# Patient Record
Sex: Male | Born: 1935 | Race: White | Hispanic: No | Marital: Married | Smoking: Former smoker
Health system: Southern US, Community
[De-identification: ages and names within clinical notes are randomized; demographics above are authoritative.]

## PROBLEM LIST (undated history)

## (undated) DIAGNOSIS — I499 Cardiac arrhythmia, unspecified: Secondary | ICD-10-CM

## (undated) DIAGNOSIS — F329 Major depressive disorder, single episode, unspecified: Secondary | ICD-10-CM

## (undated) DIAGNOSIS — K5790 Diverticulosis of intestine, part unspecified, without perforation or abscess without bleeding: Secondary | ICD-10-CM

## (undated) DIAGNOSIS — I10 Essential (primary) hypertension: Secondary | ICD-10-CM

## (undated) DIAGNOSIS — E785 Hyperlipidemia, unspecified: Secondary | ICD-10-CM

## (undated) DIAGNOSIS — R251 Tremor, unspecified: Secondary | ICD-10-CM

## (undated) DIAGNOSIS — I6529 Occlusion and stenosis of unspecified carotid artery: Secondary | ICD-10-CM

## (undated) DIAGNOSIS — F419 Anxiety disorder, unspecified: Secondary | ICD-10-CM

## (undated) DIAGNOSIS — N400 Enlarged prostate without lower urinary tract symptoms: Secondary | ICD-10-CM

## (undated) DIAGNOSIS — R269 Unspecified abnormalities of gait and mobility: Secondary | ICD-10-CM

## (undated) DIAGNOSIS — R413 Other amnesia: Secondary | ICD-10-CM

## (undated) DIAGNOSIS — F32A Depression, unspecified: Secondary | ICD-10-CM

## (undated) DIAGNOSIS — D126 Benign neoplasm of colon, unspecified: Secondary | ICD-10-CM

## (undated) DIAGNOSIS — E1142 Type 2 diabetes mellitus with diabetic polyneuropathy: Secondary | ICD-10-CM

## (undated) DIAGNOSIS — M1990 Unspecified osteoarthritis, unspecified site: Secondary | ICD-10-CM

## (undated) DIAGNOSIS — I350 Nonrheumatic aortic (valve) stenosis: Secondary | ICD-10-CM

## (undated) DIAGNOSIS — F0390 Unspecified dementia without behavioral disturbance: Secondary | ICD-10-CM

## (undated) DIAGNOSIS — E119 Type 2 diabetes mellitus without complications: Secondary | ICD-10-CM

## (undated) DIAGNOSIS — K219 Gastro-esophageal reflux disease without esophagitis: Secondary | ICD-10-CM

## (undated) DIAGNOSIS — E538 Deficiency of other specified B group vitamins: Secondary | ICD-10-CM

## (undated) HISTORY — DX: Unspecified abnormalities of gait and mobility: R26.9

## (undated) HISTORY — DX: Nonrheumatic aortic (valve) stenosis: I35.0

## (undated) HISTORY — DX: Essential (primary) hypertension: I10

## (undated) HISTORY — PX: LUMBAR LAMINECTOMY: SHX95

## (undated) HISTORY — DX: Unspecified osteoarthritis, unspecified site: M19.90

## (undated) HISTORY — DX: Type 2 diabetes mellitus with diabetic polyneuropathy: E11.42

## (undated) HISTORY — DX: Benign neoplasm of colon, unspecified: D12.6

## (undated) HISTORY — DX: Tremor, unspecified: R25.1

## (undated) HISTORY — DX: Occlusion and stenosis of unspecified carotid artery: I65.29

## (undated) HISTORY — PX: OTHER SURGICAL HISTORY: SHX169

## (undated) HISTORY — PX: VASECTOMY: SHX75

## (undated) HISTORY — PX: TONSILLECTOMY AND ADENOIDECTOMY: SHX28

## (undated) HISTORY — PX: CATARACT EXTRACTION, BILATERAL: SHX1313

## (undated) HISTORY — DX: Major depressive disorder, single episode, unspecified: F32.9

## (undated) HISTORY — DX: Deficiency of other specified B group vitamins: E53.8

## (undated) HISTORY — DX: Anxiety disorder, unspecified: F41.9

## (undated) HISTORY — DX: Depression, unspecified: F32.A

## (undated) HISTORY — DX: Other amnesia: R41.3

## (undated) HISTORY — DX: Hyperlipidemia, unspecified: E78.5

## (undated) HISTORY — DX: Gastro-esophageal reflux disease without esophagitis: K21.9

## (undated) HISTORY — DX: Diverticulosis of intestine, part unspecified, without perforation or abscess without bleeding: K57.90

## (undated) HISTORY — DX: Type 2 diabetes mellitus without complications: E11.9

---

## 1998-06-09 ENCOUNTER — Ambulatory Visit: Admission: RE | Admit: 1998-06-09 | Discharge: 1998-06-09 | Payer: Self-pay | Admitting: Internal Medicine

## 1999-02-15 ENCOUNTER — Ambulatory Visit: Admission: RE | Admit: 1999-02-15 | Discharge: 1999-02-15 | Payer: Self-pay | Admitting: Pulmonary Disease

## 2001-01-25 ENCOUNTER — Encounter: Payer: Self-pay | Admitting: Urology

## 2001-01-26 ENCOUNTER — Ambulatory Visit (HOSPITAL_COMMUNITY): Admission: RE | Admit: 2001-01-26 | Discharge: 2001-01-26 | Payer: Self-pay | Admitting: Urology

## 2002-09-14 ENCOUNTER — Encounter: Admission: RE | Admit: 2002-09-14 | Discharge: 2002-09-14 | Payer: Self-pay | Admitting: Internal Medicine

## 2002-09-14 ENCOUNTER — Encounter: Payer: Self-pay | Admitting: Internal Medicine

## 2003-01-05 ENCOUNTER — Encounter: Payer: Self-pay | Admitting: Gastroenterology

## 2003-01-05 ENCOUNTER — Ambulatory Visit (HOSPITAL_COMMUNITY): Admission: RE | Admit: 2003-01-05 | Discharge: 2003-01-05 | Payer: Self-pay | Admitting: Gastroenterology

## 2003-09-04 ENCOUNTER — Ambulatory Visit (HOSPITAL_COMMUNITY): Admission: RE | Admit: 2003-09-04 | Discharge: 2003-09-04 | Payer: Self-pay | Admitting: Internal Medicine

## 2003-10-04 ENCOUNTER — Encounter: Admission: RE | Admit: 2003-10-04 | Discharge: 2004-01-02 | Payer: Self-pay | Admitting: Neurology

## 2004-05-14 ENCOUNTER — Ambulatory Visit: Payer: Self-pay | Admitting: Internal Medicine

## 2004-06-02 ENCOUNTER — Ambulatory Visit: Payer: Self-pay | Admitting: Pulmonary Disease

## 2004-07-22 ENCOUNTER — Ambulatory Visit: Payer: Self-pay | Admitting: Internal Medicine

## 2004-07-23 ENCOUNTER — Ambulatory Visit: Payer: Self-pay | Admitting: Internal Medicine

## 2004-09-03 ENCOUNTER — Ambulatory Visit: Payer: Self-pay | Admitting: Internal Medicine

## 2004-09-22 ENCOUNTER — Ambulatory Visit: Payer: Self-pay | Admitting: Internal Medicine

## 2004-10-15 ENCOUNTER — Ambulatory Visit: Payer: Self-pay | Admitting: Internal Medicine

## 2004-11-25 ENCOUNTER — Ambulatory Visit: Payer: Self-pay | Admitting: Internal Medicine

## 2004-11-26 ENCOUNTER — Ambulatory Visit: Payer: Self-pay | Admitting: Internal Medicine

## 2005-02-03 ENCOUNTER — Ambulatory Visit: Payer: Self-pay | Admitting: Cardiology

## 2005-02-03 ENCOUNTER — Ambulatory Visit: Payer: Self-pay | Admitting: Internal Medicine

## 2005-02-10 ENCOUNTER — Ambulatory Visit: Payer: Self-pay | Admitting: Internal Medicine

## 2005-02-17 ENCOUNTER — Ambulatory Visit: Payer: Self-pay | Admitting: Internal Medicine

## 2005-04-21 ENCOUNTER — Ambulatory Visit: Payer: Self-pay | Admitting: Internal Medicine

## 2005-05-26 ENCOUNTER — Ambulatory Visit: Payer: Self-pay | Admitting: Internal Medicine

## 2005-06-25 ENCOUNTER — Ambulatory Visit: Payer: Self-pay | Admitting: Internal Medicine

## 2005-07-01 ENCOUNTER — Ambulatory Visit: Payer: Self-pay | Admitting: Internal Medicine

## 2005-07-27 ENCOUNTER — Ambulatory Visit: Payer: Self-pay

## 2005-08-05 ENCOUNTER — Ambulatory Visit: Payer: Self-pay | Admitting: Internal Medicine

## 2005-08-27 HISTORY — PX: TRANSURETHRAL RESECTION OF PROSTATE: SHX73

## 2005-09-01 ENCOUNTER — Ambulatory Visit: Payer: Self-pay | Admitting: Internal Medicine

## 2005-10-23 ENCOUNTER — Ambulatory Visit: Payer: Self-pay | Admitting: Internal Medicine

## 2005-11-03 ENCOUNTER — Ambulatory Visit: Payer: Self-pay | Admitting: Internal Medicine

## 2005-12-07 ENCOUNTER — Ambulatory Visit: Payer: Self-pay | Admitting: Internal Medicine

## 2006-01-06 ENCOUNTER — Ambulatory Visit: Payer: Self-pay | Admitting: Internal Medicine

## 2006-01-28 ENCOUNTER — Ambulatory Visit: Payer: Self-pay | Admitting: Internal Medicine

## 2006-02-03 ENCOUNTER — Ambulatory Visit: Payer: Self-pay | Admitting: Internal Medicine

## 2006-02-09 ENCOUNTER — Encounter: Payer: Self-pay | Admitting: Internal Medicine

## 2006-03-12 ENCOUNTER — Ambulatory Visit: Payer: Self-pay | Admitting: Internal Medicine

## 2006-04-12 ENCOUNTER — Ambulatory Visit: Payer: Self-pay | Admitting: Internal Medicine

## 2006-04-28 ENCOUNTER — Ambulatory Visit: Payer: Self-pay | Admitting: Internal Medicine

## 2006-04-28 LAB — CONVERTED CEMR LAB
Bilirubin Urine: NEGATIVE
Ketones, ur: NEGATIVE mg/dL
Leukocytes, UA: NEGATIVE
Nitrite: NEGATIVE
Potassium: 3.7 meq/L (ref 3.5–5.1)
Sed Rate: 5 mm/hr (ref 0–20)
Sodium: 141 meq/L (ref 135–145)
Specific Gravity, Urine: 1.02 (ref 1.000–1.03)
TSH: 1.34 microintl units/mL (ref 0.35–5.50)
Total CK: 62 units/L (ref 7–195)
Urine Glucose: NEGATIVE mg/dL
Urobilinogen, UA: 0.2 (ref 0.0–1.0)
pH: 6 (ref 5.0–8.0)

## 2006-05-25 ENCOUNTER — Encounter: Admission: RE | Admit: 2006-05-25 | Discharge: 2006-05-25 | Payer: Self-pay | Admitting: Orthopaedic Surgery

## 2006-06-02 ENCOUNTER — Ambulatory Visit: Payer: Self-pay | Admitting: Internal Medicine

## 2006-06-14 ENCOUNTER — Encounter: Admission: RE | Admit: 2006-06-14 | Discharge: 2006-06-14 | Payer: Self-pay | Admitting: Orthopaedic Surgery

## 2006-07-01 ENCOUNTER — Ambulatory Visit: Payer: Self-pay | Admitting: Internal Medicine

## 2006-07-01 LAB — CONVERTED CEMR LAB
ALT: 42 units/L — ABNORMAL HIGH (ref 0–40)
Creatinine, Ser: 1 mg/dL (ref 0.4–1.5)
Potassium: 3.7 meq/L (ref 3.5–5.1)
Testosterone, total: 2.5267 ng/mL — ABNORMAL LOW

## 2006-07-21 ENCOUNTER — Ambulatory Visit: Payer: Self-pay | Admitting: Internal Medicine

## 2006-07-27 ENCOUNTER — Ambulatory Visit: Payer: Self-pay

## 2006-07-27 LAB — CONVERTED CEMR LAB
AST: 23 units/L (ref 0–37)
BUN: 11 mg/dL (ref 6–23)
Bacteria, UA: NEGATIVE
Basophils Relative: 0.4 % (ref 0.0–1.0)
Bilirubin Urine: NEGATIVE
Crystals: NEGATIVE
Glucose, Bld: 116 mg/dL — ABNORMAL HIGH (ref 70–99)
HCT: 41.8 % (ref 39.0–52.0)
HDL: 36.2 mg/dL — ABNORMAL LOW (ref >39.0)
Hemoglobin, Urine: NEGATIVE
Hemoglobin: 14.9 g/dL (ref 13.0–17.0)
INR: 0.9 (ref 0.9–2.0)
MCHC: 35.7 g/dL (ref 30.0–36.0)
Monocytes Absolute: 1.1 10*3/uL — ABNORMAL HIGH (ref 0.2–0.7)
Mucus, UA: NEGATIVE
Neutrophils Relative %: 69.7 % (ref 43.0–77.0)
Nitrite: NEGATIVE
RDW: 12.7 % (ref 11.5–14.6)
Sodium: 141 meq/L (ref 135–145)
TSH: 1.61 microintl units/mL (ref 0.35–5.50)
Total Protein, Urine: 100 mg/dL — AB

## 2006-08-04 ENCOUNTER — Ambulatory Visit: Payer: Self-pay | Admitting: Internal Medicine

## 2006-08-16 ENCOUNTER — Ambulatory Visit: Payer: Self-pay

## 2006-08-16 ENCOUNTER — Encounter: Payer: Self-pay | Admitting: Cardiology

## 2006-09-01 ENCOUNTER — Ambulatory Visit: Payer: Self-pay | Admitting: Internal Medicine

## 2006-09-06 ENCOUNTER — Ambulatory Visit (HOSPITAL_COMMUNITY): Admission: RE | Admit: 2006-09-06 | Discharge: 2006-09-07 | Payer: Self-pay | Admitting: Orthopaedic Surgery

## 2006-10-12 ENCOUNTER — Ambulatory Visit: Payer: Self-pay | Admitting: Internal Medicine

## 2006-11-17 ENCOUNTER — Ambulatory Visit: Payer: Self-pay | Admitting: Internal Medicine

## 2006-11-23 ENCOUNTER — Ambulatory Visit: Payer: Self-pay | Admitting: Internal Medicine

## 2006-12-13 ENCOUNTER — Encounter
Admission: RE | Admit: 2006-12-13 | Discharge: 2007-03-13 | Payer: Self-pay | Admitting: Physical Medicine & Rehabilitation

## 2006-12-13 ENCOUNTER — Ambulatory Visit: Payer: Self-pay | Admitting: Physical Medicine & Rehabilitation

## 2006-12-20 ENCOUNTER — Ambulatory Visit (HOSPITAL_COMMUNITY)
Admission: RE | Admit: 2006-12-20 | Discharge: 2006-12-20 | Payer: Self-pay | Admitting: Physical Medicine & Rehabilitation

## 2007-01-10 ENCOUNTER — Ambulatory Visit: Payer: Self-pay | Admitting: Internal Medicine

## 2007-01-10 LAB — CONVERTED CEMR LAB
ALT: 16 units/L (ref 0–53)
AST: 17 units/L (ref 0–37)
Albumin: 3.7 g/dL (ref 3.5–5.2)
Calcium: 9.5 mg/dL (ref 8.4–10.5)
Chloride: 100 meq/L (ref 96–112)
Creatinine, Ser: 1 mg/dL (ref 0.4–1.5)
Glucose, Bld: 124 mg/dL — ABNORMAL HIGH (ref 70–99)
Sodium: 139 meq/L (ref 135–145)
Total Bilirubin: 1.1 mg/dL (ref 0.3–1.2)
Total CK: 49 units/L (ref 7–195)

## 2007-01-14 DIAGNOSIS — E785 Hyperlipidemia, unspecified: Secondary | ICD-10-CM

## 2007-01-14 DIAGNOSIS — I10 Essential (primary) hypertension: Secondary | ICD-10-CM | POA: Insufficient documentation

## 2007-01-25 ENCOUNTER — Ambulatory Visit: Payer: Self-pay | Admitting: Internal Medicine

## 2007-02-07 ENCOUNTER — Ambulatory Visit: Payer: Self-pay | Admitting: Physical Medicine & Rehabilitation

## 2007-02-24 ENCOUNTER — Ambulatory Visit: Payer: Self-pay | Admitting: Internal Medicine

## 2007-03-18 DIAGNOSIS — M1990 Unspecified osteoarthritis, unspecified site: Secondary | ICD-10-CM | POA: Insufficient documentation

## 2007-03-18 DIAGNOSIS — M545 Low back pain: Secondary | ICD-10-CM

## 2007-03-18 DIAGNOSIS — F329 Major depressive disorder, single episode, unspecified: Secondary | ICD-10-CM

## 2007-04-08 ENCOUNTER — Ambulatory Visit: Payer: Self-pay | Admitting: Internal Medicine

## 2007-04-08 ENCOUNTER — Encounter: Payer: Self-pay | Admitting: Internal Medicine

## 2007-04-08 DIAGNOSIS — R5381 Other malaise: Secondary | ICD-10-CM

## 2007-04-08 DIAGNOSIS — R5383 Other fatigue: Secondary | ICD-10-CM

## 2007-05-08 ENCOUNTER — Encounter: Payer: Self-pay | Admitting: Internal Medicine

## 2007-05-11 ENCOUNTER — Encounter: Payer: Self-pay | Admitting: Internal Medicine

## 2007-05-11 ENCOUNTER — Encounter: Admission: RE | Admit: 2007-05-11 | Discharge: 2007-05-11 | Payer: Self-pay | Admitting: Neurology

## 2007-05-18 ENCOUNTER — Encounter: Payer: Self-pay | Admitting: Internal Medicine

## 2007-05-23 ENCOUNTER — Ambulatory Visit: Payer: Self-pay | Admitting: Internal Medicine

## 2007-05-23 DIAGNOSIS — F411 Generalized anxiety disorder: Secondary | ICD-10-CM | POA: Insufficient documentation

## 2007-05-23 DIAGNOSIS — E538 Deficiency of other specified B group vitamins: Secondary | ICD-10-CM

## 2007-05-23 DIAGNOSIS — K219 Gastro-esophageal reflux disease without esophagitis: Secondary | ICD-10-CM

## 2007-05-23 LAB — CONVERTED CEMR LAB: Blood Glucose, Fingerstick: 108

## 2007-06-21 ENCOUNTER — Ambulatory Visit: Payer: Self-pay | Admitting: Internal Medicine

## 2007-07-11 ENCOUNTER — Ambulatory Visit: Payer: Self-pay | Admitting: Internal Medicine

## 2007-07-11 ENCOUNTER — Ambulatory Visit: Payer: Self-pay

## 2007-07-18 ENCOUNTER — Ambulatory Visit: Payer: Self-pay | Admitting: Internal Medicine

## 2007-08-04 ENCOUNTER — Telehealth: Payer: Self-pay | Admitting: Internal Medicine

## 2007-08-17 ENCOUNTER — Encounter: Payer: Self-pay | Admitting: Internal Medicine

## 2007-08-18 ENCOUNTER — Ambulatory Visit: Payer: Self-pay | Admitting: Internal Medicine

## 2007-08-23 ENCOUNTER — Encounter: Payer: Self-pay | Admitting: Internal Medicine

## 2007-09-12 ENCOUNTER — Encounter: Payer: Self-pay | Admitting: Internal Medicine

## 2007-09-16 ENCOUNTER — Ambulatory Visit: Payer: Self-pay | Admitting: Internal Medicine

## 2007-09-16 ENCOUNTER — Telehealth: Payer: Self-pay | Admitting: Internal Medicine

## 2007-09-16 ENCOUNTER — Observation Stay (HOSPITAL_COMMUNITY): Admission: AD | Admit: 2007-09-16 | Discharge: 2007-09-18 | Payer: Self-pay | Admitting: Internal Medicine

## 2007-09-16 DIAGNOSIS — R42 Dizziness and giddiness: Secondary | ICD-10-CM | POA: Insufficient documentation

## 2007-09-23 ENCOUNTER — Ambulatory Visit: Payer: Self-pay | Admitting: Internal Medicine

## 2007-09-23 DIAGNOSIS — R519 Headache, unspecified: Secondary | ICD-10-CM | POA: Insufficient documentation

## 2007-09-23 DIAGNOSIS — R51 Headache: Secondary | ICD-10-CM

## 2007-10-05 ENCOUNTER — Encounter: Admission: RE | Admit: 2007-10-05 | Discharge: 2007-10-05 | Payer: Self-pay | Admitting: Orthopaedic Surgery

## 2007-10-10 ENCOUNTER — Telehealth: Payer: Self-pay | Admitting: Internal Medicine

## 2007-10-19 ENCOUNTER — Ambulatory Visit: Payer: Self-pay | Admitting: Internal Medicine

## 2007-10-20 ENCOUNTER — Encounter: Admission: RE | Admit: 2007-10-20 | Discharge: 2007-10-20 | Payer: Self-pay | Admitting: Orthopaedic Surgery

## 2007-10-27 ENCOUNTER — Encounter: Payer: Self-pay | Admitting: Internal Medicine

## 2007-11-17 ENCOUNTER — Ambulatory Visit: Payer: Self-pay | Admitting: Internal Medicine

## 2007-11-18 LAB — CONVERTED CEMR LAB
ALT: 22 units/L (ref 0–53)
Alkaline Phosphatase: 59 units/L (ref 39–117)
Bilirubin, Direct: 0.1 mg/dL (ref 0.0–0.3)
CO2: 31 meq/L (ref 19–32)
Chloride: 99 meq/L (ref 96–112)
Folate: 13.8 ng/mL
Glucose, Bld: 122 mg/dL — ABNORMAL HIGH (ref 70–99)
Potassium: 3.3 meq/L — ABNORMAL LOW (ref 3.5–5.1)
Sodium: 137 meq/L (ref 135–145)
Total Bilirubin: 1 mg/dL (ref 0.3–1.2)
Total Protein: 6.4 g/dL (ref 6.0–8.3)

## 2007-11-22 ENCOUNTER — Ambulatory Visit: Payer: Self-pay | Admitting: Internal Medicine

## 2007-12-08 ENCOUNTER — Encounter: Payer: Self-pay | Admitting: Internal Medicine

## 2007-12-23 ENCOUNTER — Ambulatory Visit: Payer: Self-pay | Admitting: Internal Medicine

## 2007-12-28 ENCOUNTER — Encounter: Admission: RE | Admit: 2007-12-28 | Discharge: 2007-12-29 | Payer: Self-pay | Admitting: Orthopedic Surgery

## 2008-01-20 ENCOUNTER — Encounter: Payer: Self-pay | Admitting: Internal Medicine

## 2008-01-23 ENCOUNTER — Ambulatory Visit: Payer: Self-pay | Admitting: Internal Medicine

## 2008-01-25 LAB — CONVERTED CEMR LAB
BUN: 12 mg/dL (ref 6–23)
CO2: 29 meq/L (ref 19–32)
Chloride: 105 meq/L (ref 96–112)
Creatinine, Ser: 1 mg/dL (ref 0.4–1.5)
GFR calc non Af Amer: 76 mL/min
Hgb A1c MFr Bld: 6.1 % — ABNORMAL HIGH (ref 4.6–6.0)
Potassium: 3.9 meq/L (ref 3.5–5.1)

## 2008-02-23 ENCOUNTER — Ambulatory Visit: Payer: Self-pay | Admitting: Internal Medicine

## 2008-02-29 ENCOUNTER — Encounter: Admission: RE | Admit: 2008-02-29 | Discharge: 2008-05-29 | Payer: Self-pay | Admitting: Orthopedic Surgery

## 2008-03-02 ENCOUNTER — Encounter: Payer: Self-pay | Admitting: Internal Medicine

## 2008-03-08 ENCOUNTER — Encounter: Payer: Self-pay | Admitting: Internal Medicine

## 2008-03-27 ENCOUNTER — Ambulatory Visit: Payer: Self-pay | Admitting: Internal Medicine

## 2008-03-27 LAB — CONVERTED CEMR LAB: Blood Glucose, Fingerstick: 96

## 2008-04-23 ENCOUNTER — Encounter: Payer: Self-pay | Admitting: Internal Medicine

## 2008-04-26 ENCOUNTER — Ambulatory Visit: Payer: Self-pay | Admitting: Internal Medicine

## 2008-04-30 ENCOUNTER — Telehealth: Payer: Self-pay | Admitting: Internal Medicine

## 2008-05-08 ENCOUNTER — Encounter: Payer: Self-pay | Admitting: Internal Medicine

## 2008-05-28 ENCOUNTER — Ambulatory Visit: Payer: Self-pay | Admitting: Internal Medicine

## 2008-06-05 ENCOUNTER — Encounter: Payer: Self-pay | Admitting: Internal Medicine

## 2008-06-08 ENCOUNTER — Telehealth: Payer: Self-pay | Admitting: Internal Medicine

## 2008-06-15 ENCOUNTER — Telehealth: Payer: Self-pay | Admitting: Internal Medicine

## 2008-06-27 ENCOUNTER — Ambulatory Visit: Payer: Self-pay | Admitting: Internal Medicine

## 2008-06-28 LAB — CONVERTED CEMR LAB
CO2: 32 meq/L (ref 19–32)
Calcium: 9.5 mg/dL (ref 8.4–10.5)
GFR calc Af Amer: 92 mL/min
Hgb A1c MFr Bld: 6 % (ref 4.6–6.0)
Potassium: 3.8 meq/L (ref 3.5–5.1)
Sodium: 140 meq/L (ref 135–145)

## 2008-07-02 ENCOUNTER — Ambulatory Visit: Payer: Self-pay | Admitting: Internal Medicine

## 2008-07-02 LAB — CONVERTED CEMR LAB: Blood Glucose, Fingerstick: 109

## 2008-07-11 ENCOUNTER — Ambulatory Visit: Payer: Self-pay

## 2008-07-11 ENCOUNTER — Encounter: Payer: Self-pay | Admitting: Internal Medicine

## 2008-07-23 ENCOUNTER — Telehealth: Payer: Self-pay | Admitting: Internal Medicine

## 2008-07-23 ENCOUNTER — Encounter: Payer: Self-pay | Admitting: Internal Medicine

## 2008-07-27 ENCOUNTER — Encounter: Payer: Self-pay | Admitting: Internal Medicine

## 2008-07-31 ENCOUNTER — Ambulatory Visit: Payer: Self-pay | Admitting: Internal Medicine

## 2008-07-31 DIAGNOSIS — R269 Unspecified abnormalities of gait and mobility: Secondary | ICD-10-CM | POA: Insufficient documentation

## 2008-07-31 LAB — CONVERTED CEMR LAB: Blood Glucose, Fingerstick: 104

## 2008-08-08 ENCOUNTER — Ambulatory Visit: Payer: Self-pay | Admitting: Cardiology

## 2008-08-16 ENCOUNTER — Ambulatory Visit: Payer: Self-pay

## 2008-08-16 ENCOUNTER — Encounter: Payer: Self-pay | Admitting: Cardiology

## 2008-08-30 ENCOUNTER — Ambulatory Visit: Payer: Self-pay | Admitting: Internal Medicine

## 2008-09-26 ENCOUNTER — Ambulatory Visit: Payer: Self-pay | Admitting: Internal Medicine

## 2008-09-26 LAB — CONVERTED CEMR LAB
BUN: 12 mg/dL (ref 6–23)
Creatinine, Ser: 1 mg/dL (ref 0.4–1.5)
GFR calc non Af Amer: 76.04 mL/min (ref >60)
Hgb A1c MFr Bld: 6.1 % (ref 4.6–6.5)
Potassium: 3.9 meq/L (ref 3.5–5.1)

## 2008-10-02 ENCOUNTER — Ambulatory Visit: Payer: Self-pay | Admitting: Internal Medicine

## 2008-11-01 ENCOUNTER — Ambulatory Visit: Payer: Self-pay | Admitting: Internal Medicine

## 2008-11-29 ENCOUNTER — Ambulatory Visit (HOSPITAL_COMMUNITY): Admission: RE | Admit: 2008-11-29 | Discharge: 2008-11-29 | Payer: Self-pay | Admitting: Unknown Physician Specialty

## 2008-11-30 ENCOUNTER — Ambulatory Visit: Payer: Self-pay | Admitting: Internal Medicine

## 2008-11-30 LAB — CONVERTED CEMR LAB: Blood Glucose, Fingerstick: 119

## 2008-12-05 ENCOUNTER — Telehealth: Payer: Self-pay | Admitting: Internal Medicine

## 2009-01-01 ENCOUNTER — Telehealth: Payer: Self-pay | Admitting: Internal Medicine

## 2009-01-03 ENCOUNTER — Ambulatory Visit: Payer: Self-pay | Admitting: Internal Medicine

## 2009-01-24 ENCOUNTER — Ambulatory Visit: Payer: Self-pay | Admitting: Internal Medicine

## 2009-01-24 LAB — CONVERTED CEMR LAB
ALT: 19 units/L (ref 0–53)
AST: 22 units/L (ref 0–37)
Alkaline Phosphatase: 58 units/L (ref 39–117)
BUN: 16 mg/dL (ref 6–23)
Bilirubin, Direct: 0.2 mg/dL (ref 0.0–0.3)
Calcium: 9.5 mg/dL (ref 8.4–10.5)
GFR calc non Af Amer: 85.8 mL/min (ref >60)
Glucose, Bld: 87 mg/dL (ref 70–99)
Total Bilirubin: 1.3 mg/dL — ABNORMAL HIGH (ref 0.3–1.2)

## 2009-01-28 ENCOUNTER — Encounter: Admission: RE | Admit: 2009-01-28 | Discharge: 2009-02-26 | Payer: Self-pay | Admitting: Neurology

## 2009-01-29 ENCOUNTER — Ambulatory Visit: Payer: Self-pay | Admitting: Internal Medicine

## 2009-01-30 ENCOUNTER — Telehealth: Payer: Self-pay | Admitting: Internal Medicine

## 2009-02-07 ENCOUNTER — Encounter: Payer: Self-pay | Admitting: Internal Medicine

## 2009-03-08 ENCOUNTER — Ambulatory Visit: Payer: Self-pay | Admitting: Internal Medicine

## 2009-03-08 DIAGNOSIS — M542 Cervicalgia: Secondary | ICD-10-CM

## 2009-04-03 ENCOUNTER — Encounter: Payer: Self-pay | Admitting: Internal Medicine

## 2009-04-09 ENCOUNTER — Ambulatory Visit: Payer: Self-pay | Admitting: Internal Medicine

## 2009-04-09 DIAGNOSIS — F17200 Nicotine dependence, unspecified, uncomplicated: Secondary | ICD-10-CM

## 2009-04-09 LAB — CONVERTED CEMR LAB
BUN: 12 mg/dL (ref 6–23)
Basophils Absolute: 0 10*3/uL (ref 0.0–0.1)
Basophils Relative: 0.2 % (ref 0.0–3.0)
Bilirubin, Direct: 0.1 mg/dL (ref 0.0–0.3)
Blood Glucose, Fingerstick: 139
Chloride: 100 meq/L (ref 96–112)
Creatinine, Ser: 0.9 mg/dL (ref 0.4–1.5)
Eosinophils Absolute: 0 10*3/uL (ref 0.0–0.7)
GFR calc non Af Amer: 85.76 mL/min (ref >60)
HCT: 38.8 % — ABNORMAL LOW (ref 39.0–52.0)
Hemoglobin: 13.3 g/dL (ref 13.0–17.0)
Monocytes Absolute: 0.8 10*3/uL (ref 0.1–1.0)
Platelets: 185 10*3/uL (ref 150.0–400.0)
Potassium: 3.7 meq/L (ref 3.5–5.1)
Sed Rate: 20 mm/hr (ref 0–22)
TSH: 1.07 microintl units/mL (ref 0.35–5.50)
Total Bilirubin: 1.1 mg/dL (ref 0.3–1.2)
WBC: 8.8 10*3/uL (ref 4.5–10.5)

## 2009-04-10 ENCOUNTER — Telehealth: Payer: Self-pay | Admitting: Internal Medicine

## 2009-04-17 ENCOUNTER — Encounter: Admission: RE | Admit: 2009-04-17 | Discharge: 2009-04-17 | Payer: Self-pay | Admitting: Internal Medicine

## 2009-04-19 ENCOUNTER — Telehealth: Payer: Self-pay | Admitting: Internal Medicine

## 2009-04-29 ENCOUNTER — Encounter: Payer: Self-pay | Admitting: Internal Medicine

## 2009-05-15 ENCOUNTER — Ambulatory Visit: Payer: Self-pay | Admitting: Internal Medicine

## 2009-05-15 LAB — CONVERTED CEMR LAB: Blood Glucose, Fingerstick: 104

## 2009-05-16 ENCOUNTER — Encounter (INDEPENDENT_AMBULATORY_CARE_PROVIDER_SITE_OTHER): Payer: Self-pay | Admitting: *Deleted

## 2009-06-18 ENCOUNTER — Ambulatory Visit: Payer: Self-pay | Admitting: Internal Medicine

## 2009-06-19 ENCOUNTER — Encounter: Admission: RE | Admit: 2009-06-19 | Discharge: 2009-06-19 | Payer: Self-pay | Admitting: Orthopaedic Surgery

## 2009-07-15 ENCOUNTER — Ambulatory Visit: Payer: Self-pay | Admitting: Internal Medicine

## 2009-07-15 LAB — CONVERTED CEMR LAB
AST: 18 units/L (ref 0–37)
Alkaline Phosphatase: 46 units/L (ref 39–117)
Basophils Absolute: 0 10*3/uL (ref 0.0–0.1)
Bilirubin, Direct: 0.1 mg/dL (ref 0.0–0.3)
Calcium: 10.2 mg/dL (ref 8.4–10.5)
Eosinophils Absolute: 0.1 10*3/uL (ref 0.0–0.7)
GFR calc non Af Amer: 75.89 mL/min (ref >60)
HCT: 37.7 % — ABNORMAL LOW (ref 39.0–52.0)
Hemoglobin: 12.6 g/dL — ABNORMAL LOW (ref 13.0–17.0)
Lymphs Abs: 2.2 10*3/uL (ref 0.7–4.0)
MCHC: 33.5 g/dL (ref 30.0–36.0)
MCV: 102.6 fL — ABNORMAL HIGH (ref 78.0–100.0)
Monocytes Absolute: 0.7 10*3/uL (ref 0.1–1.0)
Monocytes Relative: 10 % (ref 3.0–12.0)
Neutro Abs: 4.3 10*3/uL (ref 1.4–7.7)
Platelets: 163 10*3/uL (ref 150.0–400.0)
Potassium: 4.2 meq/L (ref 3.5–5.1)
RDW: 13.6 % (ref 11.5–14.6)
Sodium: 148 meq/L — ABNORMAL HIGH (ref 135–145)
Total Bilirubin: 1.1 mg/dL (ref 0.3–1.2)

## 2009-07-16 ENCOUNTER — Ambulatory Visit: Payer: Self-pay | Admitting: Internal Medicine

## 2009-07-16 DIAGNOSIS — I6529 Occlusion and stenosis of unspecified carotid artery: Secondary | ICD-10-CM

## 2009-07-17 ENCOUNTER — Encounter: Payer: Self-pay | Admitting: Internal Medicine

## 2009-07-17 ENCOUNTER — Ambulatory Visit: Payer: Self-pay

## 2009-07-29 ENCOUNTER — Encounter: Payer: Self-pay | Admitting: Internal Medicine

## 2009-08-06 ENCOUNTER — Encounter: Payer: Self-pay | Admitting: Internal Medicine

## 2009-08-07 ENCOUNTER — Telehealth: Payer: Self-pay | Admitting: Internal Medicine

## 2009-08-16 ENCOUNTER — Ambulatory Visit: Payer: Self-pay | Admitting: Internal Medicine

## 2009-09-12 DIAGNOSIS — R079 Chest pain, unspecified: Secondary | ICD-10-CM | POA: Insufficient documentation

## 2009-09-12 DIAGNOSIS — R011 Cardiac murmur, unspecified: Secondary | ICD-10-CM

## 2009-09-13 ENCOUNTER — Telehealth: Payer: Self-pay | Admitting: Internal Medicine

## 2009-09-18 ENCOUNTER — Ambulatory Visit: Payer: Self-pay | Admitting: Internal Medicine

## 2009-09-18 ENCOUNTER — Telehealth: Payer: Self-pay | Admitting: Internal Medicine

## 2009-09-18 LAB — CONVERTED CEMR LAB: Blood Glucose, Fingerstick: 94

## 2009-09-24 ENCOUNTER — Ambulatory Visit: Payer: Self-pay | Admitting: Cardiology

## 2009-09-24 DIAGNOSIS — I498 Other specified cardiac arrhythmias: Secondary | ICD-10-CM

## 2009-09-24 DIAGNOSIS — I359 Nonrheumatic aortic valve disorder, unspecified: Secondary | ICD-10-CM | POA: Insufficient documentation

## 2009-09-25 ENCOUNTER — Telehealth: Payer: Self-pay | Admitting: Internal Medicine

## 2009-10-17 ENCOUNTER — Ambulatory Visit: Payer: Self-pay | Admitting: Internal Medicine

## 2009-11-19 ENCOUNTER — Ambulatory Visit: Payer: Self-pay | Admitting: Internal Medicine

## 2009-11-19 LAB — CONVERTED CEMR LAB
Albumin: 4 g/dL (ref 3.5–5.2)
Basophils Relative: 0.3 % (ref 0.0–3.0)
CO2: 32 meq/L (ref 19–32)
Chloride: 106 meq/L (ref 96–112)
Cholesterol: 100 mg/dL (ref 0–200)
Creatinine, Ser: 0.9 mg/dL (ref 0.4–1.5)
Eosinophils Absolute: 0 10*3/uL (ref 0.0–0.7)
Glucose, Bld: 99 mg/dL (ref 70–99)
MCHC: 34.3 g/dL (ref 30.0–36.0)
MCV: 100.2 fL — ABNORMAL HIGH (ref 78.0–100.0)
Monocytes Absolute: 0.7 10*3/uL (ref 0.1–1.0)
Neutro Abs: 6 10*3/uL (ref 1.4–7.7)
Neutrophils Relative %: 65.5 % (ref 43.0–77.0)
Nitrite: NEGATIVE
RBC: 3.78 M/uL — ABNORMAL LOW (ref 4.22–5.81)
RDW: 14.5 % (ref 11.5–14.6)
Specific Gravity, Urine: 1.02 (ref 1.000–1.030)
Total Protein, Urine: 100 mg/dL
Total Protein: 7 g/dL (ref 6.0–8.3)
Triglycerides: 102 mg/dL (ref 0.0–149.0)
pH: 7 (ref 5.0–8.0)

## 2009-11-22 ENCOUNTER — Ambulatory Visit: Payer: Self-pay | Admitting: Internal Medicine

## 2009-11-22 ENCOUNTER — Emergency Department (HOSPITAL_COMMUNITY): Admission: EM | Admit: 2009-11-22 | Discharge: 2009-11-23 | Payer: Self-pay | Admitting: Emergency Medicine

## 2009-11-22 LAB — CONVERTED CEMR LAB: Blood Glucose, AC Bkfst: 104 mg/dL

## 2009-12-02 ENCOUNTER — Telehealth: Payer: Self-pay | Admitting: Internal Medicine

## 2010-01-01 ENCOUNTER — Telehealth (INDEPENDENT_AMBULATORY_CARE_PROVIDER_SITE_OTHER): Payer: Self-pay | Admitting: *Deleted

## 2010-01-22 ENCOUNTER — Ambulatory Visit: Payer: Self-pay | Admitting: Internal Medicine

## 2010-01-22 LAB — CONVERTED CEMR LAB
Basophils Absolute: 0 10*3/uL (ref 0.0–0.1)
HCT: 35.7 % — ABNORMAL LOW (ref 39.0–52.0)
Lymphocytes Relative: 22.1 % (ref 12.0–46.0)
Lymphs Abs: 2.1 10*3/uL (ref 0.7–4.0)
Monocytes Relative: 7.4 % (ref 3.0–12.0)
Platelets: 194 10*3/uL (ref 150.0–400.0)
RDW: 14.6 % (ref 11.5–14.6)
Vitamin B-12: 568 pg/mL (ref 211–911)

## 2010-01-27 ENCOUNTER — Ambulatory Visit: Payer: Self-pay | Admitting: Internal Medicine

## 2010-01-27 DIAGNOSIS — R413 Other amnesia: Secondary | ICD-10-CM | POA: Insufficient documentation

## 2010-01-27 LAB — CONVERTED CEMR LAB: Blood Glucose, Fingerstick: 107

## 2010-03-21 ENCOUNTER — Encounter: Payer: Self-pay | Admitting: Internal Medicine

## 2010-03-25 ENCOUNTER — Ambulatory Visit: Payer: Self-pay | Admitting: Internal Medicine

## 2010-03-25 LAB — CONVERTED CEMR LAB
ALT: 16 units/L (ref 0–53)
Bilirubin, Direct: 0.2 mg/dL (ref 0.0–0.3)
Chloride: 105 meq/L (ref 96–112)
Creatinine, Ser: 1 mg/dL (ref 0.4–1.5)
GFR calc non Af Amer: 74.9 mL/min (ref >60)
Total Bilirubin: 1 mg/dL (ref 0.3–1.2)

## 2010-03-28 ENCOUNTER — Ambulatory Visit: Payer: Self-pay | Admitting: Internal Medicine

## 2010-03-28 DIAGNOSIS — R509 Fever, unspecified: Secondary | ICD-10-CM

## 2010-03-31 LAB — CONVERTED CEMR LAB
Bilirubin Urine: NEGATIVE
Nitrite: NEGATIVE
Specific Gravity, Urine: 1.02 (ref 1.000–1.030)
Total Protein, Urine: 100 mg/dL
pH: 6.5 (ref 5.0–8.0)

## 2010-04-23 ENCOUNTER — Encounter: Payer: Self-pay | Admitting: Internal Medicine

## 2010-05-01 ENCOUNTER — Encounter: Payer: Self-pay | Admitting: Internal Medicine

## 2010-06-05 ENCOUNTER — Ambulatory Visit: Payer: Self-pay | Admitting: Internal Medicine

## 2010-06-05 LAB — CONVERTED CEMR LAB
Alkaline Phosphatase: 50 units/L (ref 39–117)
Bilirubin, Direct: 0.2 mg/dL (ref 0.0–0.3)
Calcium: 8.8 mg/dL (ref 8.4–10.5)
GFR calc non Af Amer: 73.19 mL/min (ref >60.00)
Sodium: 137 meq/L (ref 135–145)

## 2010-06-11 ENCOUNTER — Ambulatory Visit: Payer: Self-pay | Admitting: Internal Medicine

## 2010-07-20 ENCOUNTER — Encounter: Payer: Self-pay | Admitting: Family Medicine

## 2010-07-20 ENCOUNTER — Encounter: Payer: Self-pay | Admitting: Neurology

## 2010-07-21 ENCOUNTER — Telehealth: Payer: Self-pay | Admitting: Internal Medicine

## 2010-07-27 LAB — CONVERTED CEMR LAB
CO2: 29 meq/L (ref 19–32)
Calcium: 9.7 mg/dL (ref 8.4–10.5)
GFR calc Af Amer: 104 mL/min
GFR calc non Af Amer: 86 mL/min
Glucose, Bld: 116 mg/dL — ABNORMAL HIGH (ref 70–99)
HDL: 34.4 mg/dL — ABNORMAL LOW (ref >39.0)
Potassium: 4.2 meq/L (ref 3.5–5.1)
Sodium: 139 meq/L (ref 135–145)
Total CHOL/HDL Ratio: 7.1
Triglycerides: 274 mg/dL (ref 0–149)

## 2010-07-29 NOTE — Medication Information (Signed)
Summary: Zilpidem / [facility]  Zilpidem / [facility]   Imported By: Lennie Odor 01/29/2010 11:48:32  _____________________________________________________________________  External Attachment:    Type:   Image     Comment:   External Document

## 2010-07-29 NOTE — Medication Information (Signed)
Summary: Benicar [facility]  Benicar [facility]   Imported By: Sherian Rein 08/15/2009 10:52:56  _____________________________________________________________________  External Attachment:    Type:   Image     Comment:   External Document

## 2010-07-29 NOTE — Assessment & Plan Note (Signed)
Summary: PER CHECK OUT/SF  Medications Added NORVASC 10 MG TABS (AMLODIPINE BESYLATE) take one tablet by mouth daily ZOCOR 40 MG TABS (SIMVASTATIN) take one tablet by mouth daily        Primary Provider:  Plotnkov  CC:  dizziness also chest pain.  History of Present Illness: [name] is a pleasant gentleman who I saw in February of 2010 for chest pain and hypertension.  A. Myoview was performed on August 16, 2008. Ejection fraction was 65% and there was no scar or ischemia. An echocardiogram on August 16, 2008 showed normal LV function. There was mild aortic stenosis with a mean gradient of 11 mm of mercury. He had carotid Dopplers performed in January  2011 and he had 40-59% bilateral internal carotid artery stenosis.  Followup was recommended in one year. Since I last saw him he denies any dyspnea on exertion, orthopnea, PND, pedal edema or syncope. He is not having chest pain. His wife states that his pulse does run low in the 40s. He does have decreased energy.  Current Medications (verified): 1)  Glucotrol Xl 10 Mg  Tb24 (Glipizide) .Marland Kitchen.. 1 By Mouth Once Daily 2)  Klonopin 0.5 Mg  Tabs (Clonazepam) .Marland Kitchen.. 1 By Mouth Two Times A Day As Needed 3)  Klor-Con 10 10 Meq Tbcr (Potassium Chloride) .Marland Kitchen.. 1 Po Qd 4)  Vitamin D3 1000 Unit  Tabs (Cholecalciferol) .... 2 Once Daily By Mouth 5)  Zolpidem Tartrate 10 Mg  Tabs (Zolpidem Tartrate) .... 1/2 or 1 By Mouth At [facility] Prn 6)  Duragesic-25 25 Mcg/hr  Pt72 (Fentanyl) .Marland Kitchen.. 1 Patch Q 3 Days Please,  Fill On or After 09/21/09 7)  Cobal-1000 1000 Mcg/ml Inj Soln (Cyanocobalamin) .... Administer 1cc Im Every 4 Weeks 8)  Metformin Hcl 1000 Mg Tabs (Metformin Hcl) .Marland Kitchen.. 1 By Mouth Two Times A Day 9)  Onetouch Ultra Test  Strp (Glucose Blood) .... Two Times A Day 10)  Flector 1.3 % Ptch (Diclofenac Epolamine) .... Use Qd 11)  Amlodipine Besylate 5 Mg  Tabs (Amlodipine Besylate) .Marland Kitchen.. 1 By Mouth Every Day 12)  Atenolol 50 Mg Tabs (Atenolol) .Marland Kitchen.. 1 Tab  Morning and 1/2 Tabs Qhs 13)  Voltaren 1 %  Gel (Diclofenac Sodium) .... Two Times A Day  To Qid As Needed 14)  Diovan Hct 320-12.5 Mg Tabs (Valsartan-Hydrochlorothiazide) .Marland Kitchen.. 1 By Mouth Qd  Allergies: 1)  Citalopram Hydrobromide (Citalopram Hydrobromide) 2)  Cymbalta (Duloxetine Hcl)  Past History:  Past Medical History: CAROTID ARTERY STENOSIS (ICD-433.10) GAIT DISTURBANCE (ICD-781.2) ANXIETY (ICD-300.00) GERD (ICD-530.81) VITAMIN B12 DEFICIENCY (ICD-266.2) DEPRESSION (ICD-311) OSTEOARTHRITIS (ICD-715.90) DIABETES MELLITUS, TYPE II (ICD-250.00) HYPERTENSION (ICD-401.9) HYPERLIPIDEMIA (ICD-272.4) Vit D def Tremor  No [name]'s disease per Dr Vickey Huger 2009 ? Organic brain syndrome - Dr Tawni Pummel at [facility] 2010  Past Surgical History: Reviewed history from 03/27/2008 and no changes required. Vasectomy Lumbar laminectomy L L5 Dr Ophelia Charter Transurethral resection of prostate March '07  Social History: Reviewed history from 09/16/2007 and no changes required. married '53 6 yrs - divorced; Married '68 2 sons '54, '55; 1 daughter '58 work: Airline pilot - Conservator, museum/gallery Lives with wife- I-ADLs Current Smoker, cigars Alcohol use-no  Review of Systems       Problems with neck and shoulder pain as well as other arthralgias, chronic weakness, but no fevers or chills, productive cough, hemoptysis, dysphasia, odynophagia, melena, hematochezia, dysuria, hematuria, rash, seizure activity, orthopnea, PND, pedal edema, claudication. Remaining systems are negative.   Vital Signs:  Patient profile:   77  year old male Height:      64 inches Weight:      146 pounds BMI:     25.15 Pulse rate:   45 / minute Resp:     14 per minute BP sitting:   149 / 64  (left arm)  Vitals Entered By: [name] (September 24, 2009 12:23 PM)  Physical Exam  General:  Well-developed frail in no acute distress.  Skin is warm and dry.  HEENT is normal.  Neck is supple. No thyromegaly.  Chest is clear  to auscultation with normal expansion.  Cardiovascular exam is regular rate and rhythm. 2/6 systolic murmur left sternal border. S2 is not diminished. Abdominal exam nontender or distended. No masses palpated. Extremities show no edema. neuro grossly intact    EKG  Procedure date:  09/24/2009  Findings:      marked sinus bradycardia at a rate of 45. Axis normal. Nonspecific ST changes.  Impression & Recommendations:  Problem # 1:  BRADYCARDIA (ICD-427.89)  Patient's heart rate is in the 40s. This may be contributing to his feelings of weakness. I will wean his atenolol to off. We will decrease to 50 mg p.o. daily for 2 days and then decrease further to 25 mg p.o. daily for 2 days and then discontinue. The following medications were removed from the medication list:    Amlodipine Besylate 5 Mg Tabs (Amlodipine besylate) .Marland Kitchen... 1 by mouth every day His updated medication list for this problem includes:    Atenolol 50 Mg Tabs (Atenolol) .Marland Kitchen... 1 tab morning and 1/2 tabs qhs  The following medications were removed from the medication list:    Amlodipine Besylate 5 Mg Tabs (Amlodipine besylate) .Marland Kitchen... 1 by mouth every day    Atenolol 50 Mg Tabs (Atenolol) .Marland Kitchen... 1 lab two times a day His updated medication list for this problem includes:    Norvasc 10 Mg Tabs (Amlodipine besylate) .Marland Kitchen... Take one tablet by mouth daily  Problem # 2:  CAROTID ARTERY STENOSIS (ICD-433.10) Continue aspirin. Add Zocor 40 mg p.o. daily. Check lipids and liver in 6 weeks.  Problem # 3:  HYPERTENSION (ICD-401.9)  Given that we are weaning his atenolol off I will increase his amlodipine to 10 mg p.o. daily. They will follow his blood pressure at home and we will add additional medicines as needed. The following medications were removed from the medication list:    Amlodipine Besylate 5 Mg Tabs (Amlodipine besylate) .Marland Kitchen... 1 by mouth every day    Atenolol 50 Mg Tabs (Atenolol) .Marland Kitchen... 1 lab two times a day His  updated medication list for this problem includes:    Diovan Hct 320-12.5 Mg Tabs (Valsartan-hydrochlorothiazide) .Marland Kitchen... 1 by mouth qd    Norvasc 10 Mg Tabs (Amlodipine besylate) .Marland Kitchen... Take one tablet by mouth daily  The following medications were removed from the medication list:    Amlodipine Besylate 5 Mg Tabs (Amlodipine besylate) .Marland Kitchen... 1 by mouth every day His updated medication list for this problem includes:    Atenolol 50 Mg Tabs (Atenolol) .Marland Kitchen... 1 tab morning and 1/2 tabs qhs    Diovan Hct 320-12.5 Mg Tabs (Valsartan-hydrochlorothiazide) .Marland Kitchen... 1 by mouth qd    Norvasc 10 Mg Tabs (Amlodipine besylate) .Marland Kitchen... Take one tablet by mouth daily  Problem # 4:  DIABETES MELLITUS, TYPE II (ICD-250.00)  Management per primary care. His updated medication list for this problem includes:    Glucotrol Xl 10 Mg Tb24 (Glipizide) .Marland Kitchen... 1 by mouth once daily  Metformin Hcl 1000 Mg Tabs (Metformin hcl) .Marland Kitchen... 1 by mouth two times a day    Diovan Hct 320-12.5 Mg Tabs (Valsartan-hydrochlorothiazide) .Marland Kitchen... 1 by mouth qd  His updated medication list for this problem includes:    Glucotrol Xl 10 Mg Tb24 (Glipizide) .Marland Kitchen... 1 by mouth once daily    Metformin Hcl 1000 Mg Tabs (Metformin hcl) .Marland Kitchen... 1 by mouth two times a day    Diovan Hct 320-12.5 Mg Tabs (Valsartan-hydrochlorothiazide) .Marland Kitchen... 1 by mouth qd  Problem # 5:  HYPERLIPIDEMIA (ICD-272.4)  Add Zocor. Lipids and liver in 6 weeks.  His updated medication list for this problem includes:    Zocor 40 Mg Tabs (Simvastatin) .Marland Kitchen... Take one tablet by mouth daily  Problem # 6:  AORTIC VALVE DISORDERS (ICD-424.1) Aortic stenosis continues to sound mild on examination. Plan echocardiogram in one year when he returns. The following medications were removed from the medication list:    Atenolol 50 Mg Tabs (Atenolol) .Marland Kitchen... 1 lab two times a day His updated medication list for this problem includes:    Diovan Hct 320-12.5 Mg Tabs  (Valsartan-hydrochlorothiazide) .Marland Kitchen... 1 by mouth qd  Problem # 7:  DEPRESSION (ICD-311)  Other Orders: EKG w/ Interpretation (93000)  Patient Instructions: 1)  Your physician recommends that you schedule a follow-up appointment in: 12 months 2)  Your physician has recommended you make the following change in your medication: Take Atenolol 50 mg once a day for 2 days, then 25 mg for two days then discontinue medication. New medication Zocor 40 mg once a day. Increase  Amlodipine  to  10 mg once a day. Prescriptions: ZOCOR 40 MG TABS (SIMVASTATIN) take one tablet by mouth daily  #30 x 8   Entered by:   [name] Gross, [name], [name]   Authorized by:   [name] Hamming, MD, [facility]   Signed by:   [name] Gross, [name], [name] on 09/24/2009   Method used:   Electronically to        [facility] [phone]* (retail)       [address]       [place], [place]  [address]       Ph: [phone]       Fax: [phone]   RxID:   [id number] NORVASC 10 MG TABS (AMLODIPINE BESYLATE) take one tablet by mouth daily  #30 x 8   Entered by:   [name] Gross, [name], [name]   Authorized by:   [name] Hamming, MD, [facility]   Signed by:   [name] Gross, [name], [name] on 09/24/2009   Method used:   Electronically to        [facility] [phone]* (retail)       [address]       [place], [place]  [address]       Ph: [phone]       Fax: [phone]   RxID:   [id number]

## 2010-07-29 NOTE — Miscellaneous (Signed)
Summary: Orders Update  Clinical Lists Changes  Problems: Added new problem of CAROTID ARTERY STENOSIS (ICD-433.10) Orders: Added new Test order of Carotid Duplex (Carotid Duplex) - Signed 

## 2010-07-29 NOTE — Progress Notes (Signed)
Summary: metformin  Phone Note From Pharmacy   Caller: [facility] [phone] Call For: Ref # [id number]  Summary of Call: Triage had a message to call [facility] in regards to patient Metformin. I received a vm and requested that they fax it to [place]. Initial call taken by: Lucious Groves,  September 25, 2009 1:53 PM  Follow-up for Phone Call        OK Thx Follow-up by: Tresa Garter MD,  September 25, 2009 5:34 PM  Additional Follow-up for Phone Call Additional follow up Details #1::        I spoke with the company yesterday about the patient spouse, so closed phone note. Patient is the cardholder. Additional Follow-up by: Lucious Groves,  September 26, 2009 9:54 AM

## 2010-07-29 NOTE — Assessment & Plan Note (Signed)
Summary: B-12 / AVP Natale Milch   Nurse Visit   Allergies: 1)  Citalopram Hydrobromide (Citalopram Hydrobromide)  Medication Administration  Injection # 1:    Medication: Vit B12 1000 mcg    Diagnosis: VITAMIN B12 DEFICIENCY (ICD-266.2)    Route: IM    Site: R deltoid    Exp Date: 04/2011    Lot #: [id number]    Mfr: [employer] Regent    Patient tolerated injection without complications    Given by: Ami Bullins CMA (August 16, 2009 3:22 PM)  Orders Added: 1)  Admin of Therapeutic Inj  intramuscular or subcutaneous [96372] 2)  Vit B12 1000 mcg [J3420]

## 2010-07-29 NOTE — Progress Notes (Signed)
Summary: zolpidem, metformin, atenolol  Phone Note Call from Patient   Caller: Spouse Call For: [name] Garter MD Summary of Call: request refills on meds:   atenolol, metformin,zolpidem, Initial call taken by: Tora Perches,  September 13, 2009 4:10 PM    Prescriptions: ZOLPIDEM TARTRATE 10 MG  TABS (ZOLPIDEM TARTRATE) 1/2 or 1 by mouth at hs prn  #90 x 3   Entered by:   Tora Perches   Authorized by:   [name] Garter MD   Signed by:   Tora Perches on 09/13/2009   Method used:   Telephoned to ...       [facility]* (mail-order)             ,          Ph: [phone]       Fax: [phone]   RxID:   [id number] ATENOLOL 50 MG TABS (ATENOLOL) 1 lab two times a day  #90 x 3   Entered by:   Tora Perches   Authorized by:   [name] Garter MD   Signed by:   Tora Perches on 09/13/2009   Method used:   Faxed to ...       [facility]* (mail-order)             ,          Ph: [phone]       Fax: [phone]   RxID:   [id number] METFORMIN HCL 500 MG TABS (METFORMIN HCL) Take 1 tab twice daily  #180 x 3   Entered by:   Tora Perches   Authorized by:   [name] Garter MD   Signed by:   Tora Perches on 09/13/2009   Method used:   Faxed to ...       [facility]* (mail-order)             ,          Ph: [phone]       Fax: [phone]   RxID:   [id number]

## 2010-07-29 NOTE — Letter (Signed)
Summary: [name], DPM  [name], DPM   Imported By: Lennie Odor 04/01/2010 15:10:14  _____________________________________________________________________  External Attachment:    Type:   Image     Comment:   External Document

## 2010-07-29 NOTE — Letter (Signed)
Summary: Recheck  [facility]  [facility]   Imported By: Lennie Odor 02/04/2010 11:58:38  _____________________________________________________________________  External Attachment:    Type:   Image     Comment:   External Document

## 2010-07-29 NOTE — Letter (Signed)
Summary: [facility]  [facility]   Imported By: Sherian Rein 08/14/2009 10:19:41  _____________________________________________________________________  External Attachment:    Type:   Image     Comment:   External Document

## 2010-07-29 NOTE — Assessment & Plan Note (Signed)
Summary: 2 MO ROV /NWS   Vital Signs:  Patient profile:   77 year old male Weight:      145 pounds O2 Sat:      95 % Temp:     97.8 degrees F oral Pulse rate:   49 / minute BP sitting:   100 / 60  (left arm)  Vitals Entered By: Tora Perches (September 18, 2009 1:20 PM) CC: f/u Is Patient Diabetic? Yes CBG Result 94   Primary Care Provider:  Plotnkov  CC:  f/u.  History of Present Illness: The patient presents for a follow up of back pain, anxiety, depression and DM, depression.   Preventive Screening-Counseling & Management  Alcohol-Tobacco     Smoking Status: current  Current Medications (verified): 1)  Glucotrol Xl 10 Mg  Tb24 (Glipizide) .Marland Kitchen.. 1 By Mouth Once Daily 2)  Klonopin 0.5 Mg  Tabs (Clonazepam) .Marland Kitchen.. 1 By Mouth Two Times A Day As Needed 3)  Klor-Con 10 10 Meq Tbcr (Potassium Chloride) .Marland Kitchen.. 1 Po Qd 4)  Vitamin D3 1000 Unit  Tabs (Cholecalciferol) .... 2 Once Daily By Mouth 5)  Atrovent 0.06 %  Soln (Ipratropium Bromide) .... 2 Spr Each Nostr. Qid As Needed Runny Nose 6)  Zolpidem Tartrate 10 Mg  Tabs (Zolpidem Tartrate) .... 1/2 or 1 By Mouth At [facility] Prn 7)  Duragesic-25 25 Mcg/hr  Pt72 (Fentanyl) .Marland Kitchen.. 1 Patch Q 3 Days Please,  Fill On or After 09/21/09 8)  Cobal-1000 1000 Mcg/ml Inj Soln (Cyanocobalamin) .... Administer 1cc Im Every 4 Weeks 9)  Metformin Hcl 1000 Mg Tabs (Metformin Hcl) .Marland Kitchen.. 1 By Mouth Two Times A Day 10)  Onetouch Ultra Test  Strp (Glucose Blood) .... Two Times A Day 11)  Flector 1.3 % Ptch (Diclofenac Epolamine) .... Use Qd 12)  Amlodipine Besylate 5 Mg  Tabs (Amlodipine Besylate) .Marland Kitchen.. 1 By Mouth Every Day 13)  Atenolol 50 Mg Tabs (Atenolol) .Marland Kitchen.. 1 Lab Two Times A Day 14)  Voltaren 1 %  Gel (Diclofenac Sodium) .... Two Times A Day  To Qid As Needed 15)  Trazodone Hcl 50 Mg Tabs (Trazodone Hcl) .Marland Kitchen.. 1 By Mouth At Bedtime 16)  Diovan Hct 320-12.5 Mg Tabs (Valsartan-Hydrochlorothiazide) .Marland Kitchen.. 1 By Mouth Qd  Allergies: 1)  Citalopram Hydrobromide  (Citalopram Hydrobromide) 2)  Cymbalta (Duloxetine Hcl)  Past History:  Past Medical History: Last updated: 04/09/2009 Hyperlipidemia Hypertension Diabetes mellitus, type II Low back pain Osteoarthritis Depression Vit B12 def Vit D def GERD Anxiety Tremor  No Parkinson's disease per Dr Vickey Huger 2009 ? Organic brain syndrome - Dr Tawni Pummel at [facility] 2010  Past Surgical History: Last updated: 03/27/2008 Vasectomy Lumbar laminectomy L L5 Dr Ophelia Charter Transurethral resection of prostate March '07  Social History: Last updated: 09/16/2007 married '53 6 yrs - divorced; Married '68 2 sons '54, '55; 1 daughter '58 work: Airline pilot - Conservator, museum/gallery Lives with wife- I-ADLs Current Smoker, cigars Alcohol use-no  Review of Systems  The patient denies fever, weight loss, chest pain, peripheral edema, prolonged cough, and melena.    Physical Exam  General:  alert, overweight-appearing, and not uncomfortable-appearing.   Nose:  Erythematous throat mucosa and intranasal erythema.  Mouth:  Not dry Lungs:  Normal respiratory effort, chest expands symmetrically. Lungs are clear to auscultation, no crackles or wheezes. Heart:  Normal rate and regular rhythm.  Abdomen:  Bowel sounds positive,abdomen soft and non-tender without masses, organomegaly or hernias noted. Msk:  Lumbar-sacral spine is tender to palpation over paraspinal muscles, ischial  bones and painfull with the ROM He is in a w/c Neurologic:  Weak B grip 5-/5 Skin:  Intact without suspicious lesions or rashes Psych:  less depressed affect.  not suicidal.     Impression & Recommendations:  Problem # 1:  VITAMIN B12 DEFICIENCY (ICD-266.2) Assessment Unchanged  Problem # 2:  ANXIETY (ICD-300.00) Assessment: Unchanged  The following medications were removed from the medication list:    Trazodone Hcl 50 Mg Tabs (Trazodone hcl) .Marland Kitchen... 1 by mouth at bedtime His updated medication list for this problem includes:    Klonopin  0.5 Mg Tabs (Clonazepam) .Marland Kitchen... 1 by mouth two times a day as needed  Problem # 3:  GAIT DISTURBANCE (ICD-781.2) Assessment: Unchanged  Problem # 4:  DIABETES MELLITUS, TYPE II (ICD-250.00) Assessment: Unchanged  His updated medication list for this problem includes:    Glucotrol Xl 10 Mg Tb24 (Glipizide) .Marland Kitchen... 1 by mouth once daily    Metformin Hcl 1000 Mg Tabs (Metformin hcl) .Marland Kitchen... 1 by mouth two times a day    Diovan Hct 320-12.5 Mg Tabs (Valsartan-hydrochlorothiazide) .Marland Kitchen... 1 by mouth qd  Problem # 5:  HYPERLIPIDEMIA (ICD-272.4) Assessment: Unchanged  Problem # 6:  WEAKNESS (ICD-780.79) Assessment: Unchanged  Problem # 7:  LOW BACK PAIN (ICD-724.2) Assessment: Deteriorated  His updated medication list for this problem includes:    Duragesic-25 25 Mcg/hr Pt72 (Fentanyl) .Marland Kitchen... 1 patch q 3 days please,  fill on or after 09/21/09    Butrans 10 Mcg/hr Ptwk (Buprenorphine) .Marland Kitchen... Change q 7 d  Complete Medication List: 1)  Glucotrol Xl 10 Mg Tb24 (Glipizide) .Marland Kitchen.. 1 by mouth once daily 2)  Klonopin 0.5 Mg Tabs (Clonazepam) .Marland Kitchen.. 1 by mouth two times a day as needed 3)  Klor-con 10 10 Meq Tbcr (Potassium chloride) .Marland Kitchen.. 1 po qd 4)  Vitamin D3 1000 Unit Tabs (Cholecalciferol) .... 2 once daily by mouth 5)  Atrovent 0.06 % Soln (Ipratropium bromide) .... 2 spr each nostr. qid as needed runny nose 6)  Zolpidem Tartrate 10 Mg Tabs (Zolpidem tartrate) .... 1/2 or 1 by mouth at hs prn 7)  Duragesic-25 25 Mcg/hr Pt72 (Fentanyl) .Marland Kitchen.. 1 patch q 3 days please,  fill on or after 09/21/09 8)  Cobal-1000 1000 Mcg/ml Inj Soln (Cyanocobalamin) .... Administer 1cc im every 4 weeks 9)  Metformin Hcl 1000 Mg Tabs (Metformin hcl) .Marland Kitchen.. 1 by mouth two times a day 10)  Onetouch Ultra Test Strp (Glucose blood) .... Two times a day 11)  Flector 1.3 % Ptch (Diclofenac epolamine) .... Use qd 12)  Amlodipine Besylate 5 Mg Tabs (Amlodipine besylate) .Marland Kitchen.. 1 by mouth every day 13)  Atenolol 50 Mg Tabs (Atenolol) .Marland Kitchen..  1 lab two times a day 14)  Voltaren 1 % Gel (Diclofenac sodium) .... Two times a day  to qid as needed 15)  Diovan Hct 320-12.5 Mg Tabs (Valsartan-hydrochlorothiazide) .Marland Kitchen.. 1 by mouth qd 16)  Butrans 10 Mcg/hr Ptwk (Buprenorphine) .... Change q 7 d 17)  Ritalin 5 Mg Tabs (Methylphenidate hcl) .Marland Kitchen.. 1 by mouth qam for depression and fatigue. ok to repeat at lunch if needed  Patient Instructions: 1)  Use Butrans patch instead of Duragesic 2)  Please schedule a follow-up appointment in 2 months well w/labs incl A1c v70.0 25.00. Prescriptions: ATENOLOL 50 MG TABS (ATENOLOL) 1 lab two times a day  #180 x 3   Entered and Authorized by:   Tresa Garter MD   Signed by:   Tresa Garter MD on  09/18/2009   Method used:   Electronically to        [facility]* (mail-order)             ,          Ph: [phone]       Fax: [phone]   RxID:   [id number] METFORMIN HCL 1000 MG TABS (METFORMIN HCL) 1 by mouth two times a day  #180 x 3   Entered and Authorized by:   Tresa Garter MD   Signed by:   Tresa Garter MD on 09/18/2009   Method used:   Electronically to        [facility]* (mail-order)             ,          Ph: [phone]       Fax: [phone]   RxID:   [id number] ZOLPIDEM TARTRATE 10 MG  TABS (ZOLPIDEM TARTRATE) 1/2 or 1 by mouth at hs prn  #90 x 3   Entered and Authorized by:   Tresa Garter MD   Signed by:   Tresa Garter MD on 09/18/2009   Method used:   Print then Give to Patient   RxID:   [id number] METFORMIN HCL 1000 MG TABS (METFORMIN HCL) 1 by mouth two times a day  #180 x 3   Entered and Authorized by:   Tresa Garter MD   Signed by:   Tresa Garter MD on 09/18/2009   Method used:   Print then Give to Patient   RxID:   [id number] ATENOLOL 50 MG TABS (ATENOLOL) 1 lab two times a day  #180 x 3   Entered and Authorized by:   Tresa Garter MD   Signed by:   Tresa Garter MD on  09/18/2009   Method used:   Print then Give to Patient   RxID:   [id number] KLONOPIN 0.5 MG  TABS (CLONAZEPAM) 1 by mouth two times a day as needed  #60 x 6   Entered and Authorized by:   Tresa Garter MD   Signed by:   Tresa Garter MD on 09/18/2009   Method used:   Print then Give to Patient   RxID:   [id number] RITALIN 5 MG TABS (METHYLPHENIDATE HCL) 1 by mouth qam for depression and fatigue. OK to repeat at lunch if needed  #60 x 0   Entered and Authorized by:   Tresa Garter MD   Signed by:   Tresa Garter MD on 09/18/2009   Method used:   Print then Give to Patient   RxID:   [id number] BUTRANS 10 MCG/HR PTWK (BUPRENORPHINE) change q 7 d  #4 x 3   Entered and Authorized by:   Tresa Garter MD   Signed by:   Tresa Garter MD on 09/18/2009   Method used:   Print then Give to Patient   RxID:   [id number]    Medication Administration  Injection # 1:    Medication: Vit B12 1000 mcg    Diagnosis: VITAMIN B12 DEFICIENCY (ICD-266.2)    Route: IM    Site: L deltoid    Exp Date: 04/2011    Lot #: [id number]    Mfr: [employer] Regent    Comments: 1000 micrograms given    Patient tolerated injection without complications    Given by: Tora Perches (September 18, 2009 1:40 PM)  Orders Added: 1)  Est. Patient Level IV [30865]

## 2010-07-29 NOTE — Assessment & Plan Note (Signed)
Summary: 2 MO ROV /NWS   Vital Signs:  Patient profile:   77 year old male Weight:      148 pounds BMI:     25.50 O2 Sat:      97 % on Room air Temp:     98.8 degrees F oral Pulse rate:   50 / minute Pulse rhythm:   regular Resp:     16 per minute BP sitting:   108 / 56  (left arm) Cuff size:   regular  Vitals Entered By: Lanier Prude, CMA(AAMA) (January 27, 2010 1:53 PM)  O2 Flow:  Room air CC: 2  mo f/u Is Patient Diabetic? Yes CBG Result 107   Primary Care Provider:  Plotnkov  CC:  2  mo f/u.  History of Present Illness: The patient presents for a follow up of back pain, anxiety, depression and DM. C/o no stamina, aches all over....   Current Medications (verified): 1)  Duragesic-25 25 Mcg/hr  Pt72 (Fentanyl) .Marland Kitchen.. 1 Patch Q 3 Days Please,  Fill On or After 01/21/10 2)  Glucotrol Xl 10 Mg  Tb24 (Glipizide) .Marland Kitchen.. 1 By Mouth Once Daily 3)  Klonopin 0.5 Mg  Tabs (Clonazepam) .Marland Kitchen.. 1 By Mouth Two Times A Day As Needed 4)  Klor-Con 10 10 Meq Tbcr (Potassium Chloride) .Marland Kitchen.. 1 Po Qd 5)  Vitamin D3 1000 Unit  Tabs (Cholecalciferol) .... 2 Once Daily By Mouth 6)  Zolpidem Tartrate 10 Mg  Tabs (Zolpidem Tartrate) .... 1/2 or 1 By Mouth At [facility] Prn 7)  Cobal-1000 1000 Mcg/ml Inj Soln (Cyanocobalamin) .... Administer 1cc Im Every 2 Weeks 8)  Metformin Hcl 1000 Mg Tabs (Metformin Hcl) .Marland Kitchen.. 1 By Mouth Two Times A Day 9)  Onetouch Ultra Test  Strp (Glucose Blood) .... Two Times A Day 10)  Flector 1.3 % Ptch (Diclofenac Epolamine) .... Use Qd 11)  Voltaren 1 %  Gel (Diclofenac Sodium) .... Two Times A Day  To Qid As Needed 12)  Diovan Hct 320-12.5 Mg Tabs (Valsartan-Hydrochlorothiazide) .Marland Kitchen.. 1 By Mouth Qd 13)  Norvasc 10 Mg Tabs (Amlodipine Besylate) .... Take One Tablet By Mouth Daily 14)  Zocor 40 Mg Tabs (Simvastatin) .... Take One Tablet By Mouth Daily 15)  Lyrica 75 Mg Caps (Pregabalin) .Marland Kitchen.. 1 By Mouth Two Times A Day 16)  Bd Insulin Syringe 27g X 1/2" 1 Ml Misc (Insulin  Syringe-Needle U-100) .... As Dirr 17)  Methylin 5 Mg Tabs (Methylphenidate Hcl) .Marland Kitchen.. 1 Once Daily  Allergies (verified): 1)  Citalopram Hydrobromide (Citalopram Hydrobromide) 2)  Cymbalta (Duloxetine Hcl)  Past History:  Past Medical History: Last updated: 09/24/2009 CAROTID ARTERY STENOSIS (ICD-433.10) GAIT DISTURBANCE (ICD-781.2) ANXIETY (ICD-300.00) GERD (ICD-530.81) VITAMIN B12 DEFICIENCY (ICD-266.2) DEPRESSION (ICD-311) OSTEOARTHRITIS (ICD-715.90) DIABETES MELLITUS, TYPE II (ICD-250.00) HYPERTENSION (ICD-401.9) HYPERLIPIDEMIA (ICD-272.4) Vit D def Tremor  No Parkinson's disease per Dr Vickey Huger 2009 ? Organic brain syndrome - Dr Tawni Pummel at [facility] 2010  Past Surgical History: Last updated: 03/27/2008 Vasectomy Lumbar laminectomy L L5 Dr Ophelia Charter Transurethral resection of prostate March '07  Family History: Last updated: 01/14/2007 Family History Hypertension  Social History: Last updated: 09/16/2007 married '53 6 yrs - divorced; Married '68 2 sons '54, '55; 1 daughter '58 work: Airline pilot - Conservator, museum/gallery Lives with wife- I-ADLs Current Smoker, cigars Alcohol use-no  Review of Systems       The patient complains of dyspnea on exertion, headaches, muscle weakness, difficulty walking, and depression.  The patient denies anorexia, fever, weight loss, weight gain, vision  loss, decreased hearing, hoarseness, chest pain, syncope, peripheral edema, prolonged cough, hemoptysis, abdominal pain, melena, hematochezia, severe indigestion/heartburn, hematuria, incontinence, genital sores, suspicious skin lesions, transient blindness, unusual weight change, abnormal bleeding, enlarged lymph nodes, and angioedema.    Physical Exam  General:  alert, overweight-appearing, and not uncomfortable-appearing.   Head:  Normocephalic and atraumatic without obvious abnormalities. No apparent alopecia or balding. Eyes:  pupils equal.   Ears:  R ear normal and L ear normal.   Nose:   Erythematous throat mucosa and intranasal erythema.  Mouth:  Not dry Neck:  No deformities, masses, or tenderness noted. Lungs:  Normal respiratory effort, chest expands symmetrically. Lungs are clear to auscultation, no crackles or wheezes. Heart:  Normal rate and regular rhythm.  Abdomen:  Bowel sounds positive,abdomen soft and non-tender without masses, organomegaly or hernias noted. Msk:  Lumbar-sacral spine is tender to palpation over paraspinal muscles, ischial bones and painfull with the ROM He is in a w/c Extremities:  No edema Neurologic:  Weak B grip 5-/5 Skin:  Intact without suspicious lesions or rashes Psych:  depressed affect.  not suicidal.  flat affect and subdued.     Impression & Recommendations:  Problem # 1:  GAIT DISTURBANCE (ICD-781.2) Assessment Unchanged F/u w/Dr Noel Gerold  Problem # 2:  FTT  Problem # 3:  LOW BACK PAIN (ICD-724.2) Assessment: Deteriorated He is asking me if he could have back surgery by Dr Noel Gerold. I think that he would be in a mild to moderate risk group for perioperatie complications. I'm more worried about his low stamina, motivation and ability to work with PT post-op that will negatively affect his long term post-surgery recovery. He has been refusing PT even now.Marland KitchenMarland KitchenHe will see Dr Noel Gerold in a f/u. His updated medication list for this problem includes:    Duragesic-25 25 Mcg/hr Pt72 (Fentanyl) .Marland Kitchen... 1 patch q 3 days please,  fill on or after 03/24/10  Problem # 4:  WEAKNESS (ICD-780.79) Assessment: Unchanged  Problem # 5:  DEPRESSION (ICD-311) Assessment: Deteriorated  His updated medication list for this problem includes:    Klonopin 0.5 Mg Tabs (Clonazepam) .Marland Kitchen... 1 by mouth two times a day as needed    Wellbutrin Sr 100 Mg Xr12h-tab (Bupropion hcl) .Marland Kitchen... 1 by mouth q am Risks of noncompliance with treatment discussed. Compliance encouraged.   Problem # 6:  MEMORY LOSS (ICD-780.93) Assessment: New  Problem # 7:  VITAMIN B12 DEFICIENCY  (ICD-266.2) Assessment: Unchanged  Given inj Vit B12  Problem # 8:  DIABETES MELLITUS, TYPE II (ICD-250.00) Assessment: Unchanged The labs were reviewed with the patient.  His updated medication list for this problem includes:    Glucotrol Xl 10 Mg Tb24 (Glipizide) .Marland Kitchen... 1 by mouth once daily    Metformin Hcl 1000 Mg Tabs (Metformin hcl) .Marland Kitchen... 1 by mouth two times a day    Diovan Hct 320-12.5 Mg Tabs (Valsartan-hydrochlorothiazide) .Marland Kitchen... 1 by mouth once daily I showed to the pt and his wife how to use Accu-check aviva. The office visit took longer than 45 min with patient councelling for more than 50% of the 45 min   Orders: Capillary Blood Glucose/CBG (60454)  Complete Medication List: 1)  Duragesic-25 25 Mcg/hr Pt72 (Fentanyl) .Marland Kitchen.. 1 patch q 3 days please,  fill on or after 03/24/10 2)  Glucotrol Xl 10 Mg Tb24 (Glipizide) .Marland Kitchen.. 1 by mouth once daily 3)  Klonopin 0.5 Mg Tabs (Clonazepam) .Marland Kitchen.. 1 by mouth two times a day as needed 4)  Klor-con 10  10 Meq Tbcr (Potassium chloride) .Marland Kitchen.. 1 po qd 5)  Vitamin D3 1000 Unit Tabs (Cholecalciferol) .... 2 once daily by mouth 6)  Zolpidem Tartrate 10 Mg Tabs (Zolpidem tartrate) .... 1/2 or 1 by mouth at hs prn 7)  Cobal-1000 1000 Mcg/ml Inj Soln (Cyanocobalamin) .... Administer 1cc im every 2 weeks 8)  Metformin Hcl 1000 Mg Tabs (Metformin hcl) .Marland Kitchen.. 1 by mouth two times a day 9)  Flector 1.3 % Ptch (Diclofenac epolamine) .... Use qd 10)  Voltaren 1 % Gel (Diclofenac sodium) .... Two times a day  to qid as needed 11)  Diovan Hct 320-12.5 Mg Tabs (Valsartan-hydrochlorothiazide) .Marland Kitchen.. 1 by mouth qd 12)  Norvasc 10 Mg Tabs (Amlodipine besylate) .... Take one tablet by mouth daily 13)  Zocor 40 Mg Tabs (Simvastatin) .... Take one tablet by mouth daily 14)  Lyrica 75 Mg Caps (Pregabalin) .Marland Kitchen.. 1 by mouth two times a day 15)  Bd Insulin Syringe 27g X 1/2" 1 Ml Misc (Insulin syringe-needle u-100) .... As dirr 16)  Wellbutrin Sr 100 Mg Xr12h-tab (Bupropion  hcl) .Marland Kitchen.. 1 by mouth q am 17)  Accu-chek Multiclix Lancets Misc (Lancets) .... Use qd 18)  Accu-chek Aviva Strp (Glucose blood) .... Check qd  Other Orders: Vit B12 1000 mcg (J3420) Admin of Therapeutic Inj  intramuscular or subcutaneous (29528)  Patient Instructions: 1)  Please schedule a follow-up appointment in 2 months. 2)  BMP prior to visit, ICD-9: 3)  Hepatic Panel prior to visit, ICD-9:250.00 995.20 4)  HbgA1C prior to visit, ICD-9: Prescriptions: ACCU-CHEK AVIVA  STRP (GLUCOSE BLOOD) check qd  #100 x 3   Entered and Authorized by:   Tresa Garter MD   Signed by:   Tresa Garter MD on 01/27/2010   Method used:   Print then Give to Patient   RxID:   [id number] ACCU-CHEK MULTICLIX LANCETS  MISC (LANCETS) use qd  #18 x 3   Entered and Authorized by:   Tresa Garter MD   Signed by:   Tresa Garter MD on 01/27/2010   Method used:   Print then Give to Patient   RxID:   [id number] DURAGESIC-25 25 MCG/HR  PT72 (FENTANYL) 1 patch q 3 days Please,  fill on or after 03/24/10  #10 x 0   Entered and Authorized by:   Tresa Garter MD   Signed by:   Tresa Garter MD on 01/27/2010   Method used:   Print then Give to Patient   RxID:   [id number] DURAGESIC-25 25 MCG/HR  PT72 (FENTANYL) 1 patch q 3 days Please,  fill on or after 02/21/10  #10 x 0   Entered and Authorized by:   Tresa Garter MD   Signed by:   Tresa Garter MD on 01/27/2010   Method used:   Print then Give to Patient   RxID:   [id number] WELLBUTRIN SR 100 MG XR12H-TAB (BUPROPION HCL) 1 by mouth q am  #90 x 6   Entered and Authorized by:   Tresa Garter MD   Signed by:   Tresa Garter MD on 01/27/2010   Method used:   Print then Give to Patient   RxID:   [id number]    Medication Administration  Injection # 1:    Medication: Vit B12 1000 mcg    Diagnosis: VITAMIN B12 DEFICIENCY (ICD-266.2)    Route: IM    Site: L deltoid    Exp  Date: 09/28/2011  Lot #: [id number]    Mfr: [employer]    Patient tolerated injection without complications    Given by: Lanier Prude, [employer]) (January 27, 2010 2:02 PM)  Orders Added: 1)  Capillary Blood Glucose/CBG [82948] 2)  Vit B12 1000 mcg [J3420] 3)  Admin of Therapeutic Inj  intramuscular or subcutaneous [96372] 4)  Est. Patient Level V [25956]

## 2010-07-29 NOTE — Progress Notes (Signed)
Summary: Benicar alternative  Phone Note Other Incoming   Summary of Call: We rec'd notice from [facility] that they will not cover Benicar HCT. Preferred meds are Losartan, Diovan, and Micardis (HCT options included). Would you like to change med or initiate PA? Initial call taken by: Lucious Groves,  August 07, 2009 10:38 AM  Follow-up for Phone Call        OK Diovan HCT Follow-up by: Tresa Garter MD,  August 07, 2009 4:43 PM  Additional Follow-up for Phone Call Additional follow up Details #1::        Patient and spouse notified. Sent prescription. Additional Follow-up by: Lucious Groves,  August 07, 2009 4:47 PM    New/Updated Medications: DIOVAN HCT 320-12.5 MG TABS (VALSARTAN-HYDROCHLOROTHIAZIDE) 1 by mouth qd Prescriptions: DIOVAN HCT 320-12.5 MG TABS (VALSARTAN-HYDROCHLOROTHIAZIDE) 1 by mouth qd  #90 x 3   Entered and Authorized by:   Tresa Garter MD   Signed by:   Lucious Groves on 08/07/2009   Method used:   Electronically to        [facility] [phone]* (retail)       [address]       [place], [place]  [address]       Ph: [phone]       Fax: [phone]   RxID:   [id number]

## 2010-07-29 NOTE — Letter (Signed)
[facility]   Imported By: Lennie Odor 06/03/2010 11:05:46  _____________________________________________________________________  External Attachment:    Type:   Image     Comment:   External Document

## 2010-07-29 NOTE — Medication Information (Signed)
Summary: Benicar[facility]  Benicar[facility]   Imported By: Sherian Rein 08/09/2009 14:47:12  _____________________________________________________________________  External Attachment:    Type:   Image     Comment:   External Document

## 2010-07-29 NOTE — Letter (Signed)
Summary: [facility]   Imported By: Sherian Rein 01/14/2010 08:44:27  _____________________________________________________________________  External Attachment:    Type:   Image     Comment:   External Document

## 2010-07-29 NOTE — Assessment & Plan Note (Signed)
Summary: 2 MO ROV /NWS   Vital Signs:  Patient profile:   77 year old male Height:      64 inches Weight:      151 pounds BMI:     26.01 Temp:     99.4 degrees F oral Pulse rate:   64 / minute Pulse rhythm:   regular Resp:     16 per minute BP sitting:   130 / 68  (left arm) Cuff size:   regular  Vitals Entered By: Lanier Prude, Beverly Gust) (March 28, 2010 2:43 PM) CC: 2 mo f/u Is Patient Diabetic? Yes   Primary Care Provider:  Plotnkov  CC:  2 mo f/u.  History of Present Illness: The patient presents for a follow up of hypertension, diabetes, hyperlipidemia, depression   Current Medications (verified): 1)  Duragesic-25 25 Mcg/hr  Pt72 (Fentanyl) .Marland Kitchen.. 1 Patch Q 3 Days Please,  Fill On or After 03/24/10 2)  Glucotrol Xl 10 Mg  Tb24 (Glipizide) .Marland Kitchen.. 1 By Mouth Once Daily 3)  Klonopin 0.5 Mg  Tabs (Clonazepam) .Marland Kitchen.. 1 By Mouth Two Times A Day As Needed 4)  Klor-Con 10 10 Meq Tbcr (Potassium Chloride) .Marland Kitchen.. 1 Po Qd 5)  Vitamin D3 1000 Unit  Tabs (Cholecalciferol) .... 2 Once Daily By Mouth 6)  Zolpidem Tartrate 10 Mg  Tabs (Zolpidem Tartrate) .... 1/2 or 1 By Mouth At [facility] Prn 7)  Cobal-1000 1000 Mcg/ml Inj Soln (Cyanocobalamin) .... Administer 1cc Im Every 2 Weeks 8)  Metformin Hcl 1000 Mg Tabs (Metformin Hcl) .Marland Kitchen.. 1 By Mouth Two Times A Day 9)  Flector 1.3 % Ptch (Diclofenac Epolamine) .... Use Qd 10)  Voltaren 1 %  Gel (Diclofenac Sodium) .... Two Times A Day  To Qid As Needed 11)  Diovan Hct 320-12.5 Mg Tabs (Valsartan-Hydrochlorothiazide) .Marland Kitchen.. 1 By Mouth Qd 12)  Norvasc 10 Mg Tabs (Amlodipine Besylate) .... Take One Tablet By Mouth Daily 13)  Zocor 40 Mg Tabs (Simvastatin) .... Take One Tablet By Mouth Daily 14)  Lyrica 75 Mg Caps (Pregabalin) .Marland Kitchen.. 1 By Mouth Two Times A Day 15)  Bd Insulin Syringe 27g X 1/2" 1 Ml Misc (Insulin Syringe-Needle U-100) .... As Dirr 16)  Wellbutrin Sr 100 Mg Xr12h-Tab (Bupropion Hcl) .Marland Kitchen.. 1 By Mouth Q Am 17)  Accu-Chek Multiclix Lancets   Misc (Lancets) .... Use Qd 18)  Accu-Chek Aviva  Strp (Glucose Blood) .... Check Qd  Allergies (verified): 1)  Citalopram Hydrobromide (Citalopram Hydrobromide) 2)  Cymbalta (Duloxetine Hcl)  Past History:  Past Medical History: Last updated: 09/24/2009 CAROTID ARTERY STENOSIS (ICD-433.10) GAIT DISTURBANCE (ICD-781.2) ANXIETY (ICD-300.00) GERD (ICD-530.81) VITAMIN B12 DEFICIENCY (ICD-266.2) DEPRESSION (ICD-311) OSTEOARTHRITIS (ICD-715.90) DIABETES MELLITUS, TYPE II (ICD-250.00) HYPERTENSION (ICD-401.9) HYPERLIPIDEMIA (ICD-272.4) Vit D def Tremor  No Parkinson's disease per Dr Vickey Huger 2009 ? Organic brain syndrome - Dr Tawni Pummel at [facility] 2010  Social History: Last updated: 09/16/2007 married '53 6 yrs - divorced; Married '68 2 sons '54, '55; 1 daughter '58 work: Airline pilot - Conservator, museum/gallery Lives with wife- I-ADLs Current Smoker, cigars Alcohol use-no  Review of Systems  The patient denies weight loss, weight gain, prolonged cough, abdominal pain, and depression.    Physical Exam  General:  alert, overweight-appearing, and not uncomfortable-appearing.   Nose:  Erythematous throat mucosa and intranasal erythema.  Mouth:  Not dry Lungs:  Normal respiratory effort, chest expands symmetrically. Lungs are clear to auscultation, no crackles or wheezes. Heart:  Normal rate and regular rhythm.  Abdomen:  Bowel sounds positive,abdomen soft  and non-tender without masses, organomegaly or hernias noted. Msk:  Lumbar-sacral spine is tender to palpation over paraspinal muscles, ischial bones and painfull with the ROM He is in a w/c Neurologic:  Weak B grip 5-/5 Skin:  Intact without suspicious lesions or rashes Psych:  depressed affect.  not suicidal.  flat affect and subdued.     Impression & Recommendations:  Problem # 1:  MEMORY LOSS (ICD-780.93) Assessment Unchanged  Problem # 2:  FATIGUE (ICD-780.79) Assessment: Unchanged  Problem # 3:  DIABETES MELLITUS, TYPE II  (ICD-250.00) Assessment: Unchanged  His updated medication list for this problem includes:    Glucotrol Xl 10 Mg Tb24 (Glipizide) .Marland Kitchen... 1 by mouth once daily    Metformin Hcl 1000 Mg Tabs (Metformin hcl) .Marland Kitchen... 1 by mouth two times a day    Diovan Hct 320-12.5 Mg Tabs (Valsartan-hydrochlorothiazide) .Marland Kitchen... 1 by mouth qd  Problem # 4:  DEPRESSION (ICD-311) Assessment: Improved  His updated medication list for this problem includes:    Klonopin 0.5 Mg Tabs (Clonazepam) .Marland Kitchen... 1 by mouth two times a day as needed    Wellbutrin Sr 100 Mg Xr12h-tab (Bupropion hcl) .Marland Kitchen... 1 by mouth q am  Problem # 5:  GAIT DISTURBANCE (ICD-781.2) Assessment: Unchanged  Problem # 6:  LOW BACK PAIN (ICD-724.2) Assessment: Unchanged  His updated medication list for this problem includes:    Duragesic-25 25 Mcg/hr Pt72 (Fentanyl) .Marland Kitchen... 1 patch q 3 days please,  fill on or after 06/23/10  Problem # 7:  FEVER UNSPECIFIED (ICD-780.60) ?? etiol - poss error Cipro if UTI (he is leaving for [place] tomorrow) Orders: TLB-Udip ONLY (81003-UDIP)  Complete Medication List: 1)  Duragesic-25 25 Mcg/hr Pt72 (Fentanyl) .Marland Kitchen.. 1 patch q 3 days please,  fill on or after 06/23/10 2)  Glucotrol Xl 10 Mg Tb24 (Glipizide) .Marland Kitchen.. 1 by mouth once daily 3)  Klonopin 0.5 Mg Tabs (Clonazepam) .Marland Kitchen.. 1 by mouth two times a day as needed 4)  Klor-con 10 10 Meq Tbcr (Potassium chloride) .Marland Kitchen.. 1 po qd 5)  Vitamin D3 1000 Unit Tabs (Cholecalciferol) .... 2 once daily by mouth 6)  Zolpidem Tartrate 10 Mg Tabs (Zolpidem tartrate) .... 1/2 or 1 by mouth at hs prn 7)  Cobal-1000 1000 Mcg/ml Inj Soln (Cyanocobalamin) .... Administer 1cc im every 2 weeks 8)  Metformin Hcl 1000 Mg Tabs (Metformin hcl) .Marland Kitchen.. 1 by mouth two times a day 9)  Flector 1.3 % Ptch (Diclofenac epolamine) .... Use qd 10)  Voltaren 1 % Gel (Diclofenac sodium) .... Two times a day  to qid as needed 11)  Diovan Hct 320-12.5 Mg Tabs (Valsartan-hydrochlorothiazide) .Marland Kitchen.. 1 by mouth  qd 12)  Norvasc 10 Mg Tabs (Amlodipine besylate) .... Take one tablet by mouth daily 13)  Zocor 40 Mg Tabs (Simvastatin) .... Take one tablet by mouth daily 14)  Lyrica 75 Mg Caps (Pregabalin) .Marland Kitchen.. 1 by mouth two times a day 15)  Bd Insulin Syringe 27g X 1/2" 1 Ml Misc (Insulin syringe-needle u-100) .... As dirr 16)  Wellbutrin Sr 100 Mg Xr12h-tab (Bupropion hcl) .Marland Kitchen.. 1 by mouth q am 17)  Accu-chek Multiclix Lancets Misc (Lancets) .... Use qd 18)  Accu-chek Aviva Strp (Glucose blood) .... Check qd 19)  Ciprofloxacin Hcl 500 Mg Tabs (Ciprofloxacin hcl) .Marland Kitchen.. 1 by mouth bid  Other Orders: Vit B12 1000 mcg (J3420) Admin of Therapeutic Inj  intramuscular or subcutaneous (24268) Flu Vaccine 72yrs + MEDICARE PATIENTS (T4196) Administration Flu vaccine - MCR (Q2297)  Patient Instructions: 1)  Please schedule a follow-up appointment in 3 months. 2)  BMP prior to visit, ICD-9: 3)  Hepatic Panel prior to visit, ICD-9:250.00  995.20 4)  HbgA1C prior to visit, ICD-9: Prescriptions: CIPROFLOXACIN HCL 500 MG TABS (CIPROFLOXACIN HCL) 1 by mouth bid  #20 x 1   Entered and Authorized by:   Tresa Garter MD   Signed by:   Tresa Garter MD on 03/28/2010   Method used:   Print then Give to Patient   RxID:   [id number] DURAGESIC-25 25 MCG/HR  PT72 (FENTANYL) 1 patch q 3 days Please,  fill on or after 06/23/10  #10 x 0   Entered and Authorized by:   Tresa Garter MD   Signed by:   Tresa Garter MD on 03/28/2010   Method used:   Print then Give to Patient   RxID:   [id number] DURAGESIC-25 25 MCG/HR  PT72 (FENTANYL) 1 patch q 3 days Please,  fill on or after 05/24/10  #10 x 0   Entered and Authorized by:   Tresa Garter MD   Signed by:   Tresa Garter MD on 03/28/2010   Method used:   Print then Give to Patient   RxID:   [id number] DURAGESIC-25 25 MCG/HR  PT72 (FENTANYL) 1 patch q 3 days Please,  fill on or after 04/23/10  #10 x 0   Entered and  Authorized by:   Tresa Garter MD   Signed by:   Tresa Garter MD on 03/28/2010   Method used:   Print then Give to Patient   RxID:   [id number] LYRICA 75 MG CAPS (PREGABALIN) 1 by mouth two times a day  #60 x 6   Entered and Authorized by:   Tresa Garter MD   Signed by:   Tresa Garter MD on 03/28/2010   Method used:   Print then Give to Patient   RxID:   [id number] KLONOPIN 0.5 MG  TABS (CLONAZEPAM) 1 by mouth two times a day as needed  #60 x 6   Entered and Authorized by:   Tresa Garter MD   Signed by:   Tresa Garter MD on 03/28/2010   Method used:   Print then Give to Patient   RxID:   [id number]    Medication Administration  Injection # 1:    Medication: Vit B12 1000 mcg    Diagnosis: VITAMIN B12 DEFICIENCY (ICD-266.2)    Route: IM    Site: L deltoid    Exp Date: 11/28/2011    Lot #: [id number]    Mfr: [employer]    Patient tolerated injection without complications    Given by: Lanier Prude, CMA(AAMA) (March 28, 2010 2:52 PM)  Orders Added: 1)  Vit B12 1000 mcg [J3420] 2)  Admin of Therapeutic Inj  intramuscular or subcutaneous [96372] 3)  Flu Vaccine 31yrs + MEDICARE PATIENTS [Q2039] 4)  Administration Flu vaccine - MCR [G0008] 5)  TLB-Udip ONLY [81003-UDIP] 6)  Est. Patient Level IV [09323]   .lbmedflu   Flu Vaccine Consent Questions     Do you have a history of severe allergic reactions to this vaccine? no    Any prior history of allergic reactions to egg and/or gelatin? no    Do you have a sensitivity to the preservative Thimersol? no    Do you have a past history of Guillan-Barre Syndrome? no    Do you currently have an acute febrile illness? no  Have you ever had a severe reaction to latex? no    Vaccine information given and explained to patient? yes    Are you currently pregnant? no    Lot Number:[id number]   Exp Date:12/27/2010   Site Given  Right Deltoid IM Lanier Prude, [facility])   March 28, 2010 2:53 PM

## 2010-07-29 NOTE — Progress Notes (Signed)
Summary: Atenolol  Phone Note From Pharmacy   Summary of Call: Spoke with [facility], confirmed Atenolol prescription is #180 with 3 refills.  Initial call taken by: Lucious Groves,  September 18, 2009 2:17 PM

## 2010-07-29 NOTE — Assessment & Plan Note (Signed)
Summary: 2 MO ROV /NWS   Vital Signs:  Patient profile:   77 year old male Height:      64 inches Weight:      146 pounds BMI:     25.15 O2 Sat:      97 % on Room air Temp:     98.4 degrees F oral Pulse rate:   51 / minute BP sitting:   110 / 60  (right arm) Cuff size:   regular  Vitals Entered By: Bill Salinas CMA (Nov 22, 2009 1:23 PM)  O2 Flow:  Room air CC: follow-up visit/ ab   Primary Care Provider:  Plotnkov  CC:  follow-up visit/ ab.  History of Present Illness: The patient presents for a follow up of hypertension, diabetes, hyperlipidemia, depression, LBP. He is getting weaker - can't walk 50 ft now... LBP is worse.    Current Medications (verified): 1)  Glucotrol Xl 10 Mg  Tb24 (Glipizide) .Marland Kitchen.. 1 By Mouth Once Daily 2)  Klonopin 0.5 Mg  Tabs (Clonazepam) .Marland Kitchen.. 1 By Mouth Two Times A Day As Needed 3)  Klor-Con 10 10 Meq Tbcr (Potassium Chloride) .Marland Kitchen.. 1 Po Qd 4)  Vitamin D3 1000 Unit  Tabs (Cholecalciferol) .... 2 Once Daily By Mouth 5)  Zolpidem Tartrate 10 Mg  Tabs (Zolpidem Tartrate) .... 1/2 or 1 By Mouth At [facility] Prn 6)  Duragesic-25 25 Mcg/hr  Pt72 (Fentanyl) .Marland Kitchen.. 1 Patch Q 3 Days Please,  Fill On or After 09/21/09 7)  Cobal-1000 1000 Mcg/ml Inj Soln (Cyanocobalamin) .... Administer 1cc Im Every 4 Weeks 8)  Metformin Hcl 1000 Mg Tabs (Metformin Hcl) .Marland Kitchen.. 1 By Mouth Two Times A Day 9)  Onetouch Ultra Test  Strp (Glucose Blood) .... Two Times A Day 10)  Flector 1.3 % Ptch (Diclofenac Epolamine) .... Use Qd 11)  Voltaren 1 %  Gel (Diclofenac Sodium) .... Two Times A Day  To Qid As Needed 12)  Diovan Hct 320-12.5 Mg Tabs (Valsartan-Hydrochlorothiazide) .Marland Kitchen.. 1 By Mouth Qd 13)  Norvasc 10 Mg Tabs (Amlodipine Besylate) .... Take One Tablet By Mouth Daily 14)  Zocor 40 Mg Tabs (Simvastatin) .... Take One Tablet By Mouth Daily  Allergies (verified): 1)  Citalopram Hydrobromide (Citalopram Hydrobromide) 2)  Cymbalta (Duloxetine Hcl)  Past History:  Past Medical  History: Last updated: 09/24/2009 CAROTID ARTERY STENOSIS (ICD-433.10) GAIT DISTURBANCE (ICD-781.2) ANXIETY (ICD-300.00) GERD (ICD-530.81) VITAMIN B12 DEFICIENCY (ICD-266.2) DEPRESSION (ICD-311) OSTEOARTHRITIS (ICD-715.90) DIABETES MELLITUS, TYPE II (ICD-250.00) HYPERTENSION (ICD-401.9) HYPERLIPIDEMIA (ICD-272.4) Vit D def Tremor  No Parkinson's disease per Dr Vickey Huger 2009 ? 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  The patient denies fever, dyspnea on exertion, and abdominal pain.    Physical Exam  General:  alert, overweight-appearing, and not uncomfortable-appearing.   Nose:  Erythematous throat mucosa and intranasal erythema.  Mouth:  Not dry Neck:  No deformities, masses, or tenderness noted. Lungs:  Normal respiratory effort, chest expands symmetrically. Lungs are clear to auscultation, no crackles or wheezes. Heart:  Normal rate and regular rhythm.  Abdomen:  Bowel sounds positive,abdomen soft and non-tender without masses, organomegaly or hernias noted. Msk:  Lumbar-sacral spine is tender to palpation over paraspinal muscles, ischial  bones and painfull with the ROM He is in a w/c Extremities:  No edema Neurologic:  Weak B grip 5-/5 Skin:  Intact without suspicious lesions or rashes Psych:  less depressed affect.  not suicidal.     Impression & Recommendations:  Problem # 1:  GAIT DISTURBANCE (ICD-781.2) Assessment Deteriorated Declined PT  Problem # 2:  WEAKNESS (ICD-780.79) Assessment: Deteriorated  Problem # 3:  VITAMIN B12 DEFICIENCY (ICD-266.2) Assessment: Unchanged Start q 2 wks Orders: Admin of Therapeutic Inj  intramuscular or subcutaneous  (25956) Vit B12 1000 mcg (J3420)  Problem # 4:  DIABETES MELLITUS, TYPE II (ICD-250.00) Assessment: Unchanged  His updated medication list for this problem includes:    Glucotrol Xl 10 Mg Tb24 (Glipizide) .Marland Kitchen... 1 by mouth once daily    Metformin Hcl 1000 Mg Tabs (Metformin hcl) .Marland Kitchen... 1 by mouth two times a day    Diovan Hct 320-12.5 Mg Tabs (Valsartan-hydrochlorothiazide) .Marland Kitchen... 1 by mouth qd  Problem # 5:  LOW BACK PAIN (ICD-724.2) Assessment: Unchanged  His updated medication list for this problem includes:    Duragesic-25 25 Mcg/hr Pt72 (Fentanyl) .Marland Kitchen... 1 patch q 3 days please,  fill on or after 01/21/10  Problem # 6:  HYPERLIPIDEMIA (ICD-272.4) Assessment: Unchanged  His updated medication list for this problem includes:    Zocor 40 Mg Tabs (Simvastatin) .Marland Kitchen... Take one tablet by mouth daily  Problem # 7:  OSTEOARTHRITIS (ICD-715.90) Assessment: Unchanged  His updated medication list for this problem includes:    Duragesic-25 25 Mcg/hr Pt72 (Fentanyl) .Marland Kitchen... 1 patch q 3 days please,  fill on or after 01/21/10  Complete Medication List: 1)  Duragesic-25 25 Mcg/hr Pt72 (Fentanyl) .Marland Kitchen.. 1 patch q 3 days please,  fill on or after 01/21/10 2)  Glucotrol Xl 10 Mg Tb24 (Glipizide) .Marland Kitchen.. 1 by mouth once daily 3)  Klonopin 0.5 Mg Tabs (Clonazepam) .Marland Kitchen.. 1 by mouth two times a day as needed 4)  Klor-con 10 10 Meq Tbcr (Potassium chloride) .Marland Kitchen.. 1 po qd 5)  Vitamin D3 1000 Unit Tabs (Cholecalciferol) .... 2 once daily by mouth 6)  Zolpidem Tartrate 10 Mg Tabs (Zolpidem tartrate) .... 1/2 or 1 by mouth at hs prn 7)  Cobal-1000 1000 Mcg/ml Inj Soln (Cyanocobalamin) .... Administer 1cc im every 2 weeks 8)  Metformin Hcl 1000 Mg Tabs (Metformin hcl) .Marland Kitchen.. 1 by mouth two times a day 9)  Onetouch Ultra Test Strp (Glucose blood) .... Two times a day 10)  Flector 1.3 % Ptch (Diclofenac epolamine) .... Use qd 11)  Voltaren 1 % Gel (Diclofenac sodium) .... Two times a day  to qid as needed 12)  Diovan  Hct 320-12.5 Mg Tabs (Valsartan-hydrochlorothiazide) .Marland Kitchen.. 1 by mouth qd 13)  Norvasc 10 Mg Tabs (Amlodipine besylate) .... Take one tablet by mouth daily 14)  Zocor 40 Mg Tabs (Simvastatin) .... Take one tablet by mouth daily 15)  Lyrica 75 Mg Caps (Pregabalin) .Marland Kitchen.. 1 by mouth two times a day 16)  Bd Insulin Syringe 27g X 1/2" 1 Ml Misc (Insulin syringe-needle u-100) .... As dirr 17)  Methylin 5 Mg Tabs (Methylphenidate hcl) .Marland Kitchen.. 1 once daily  Patient Instructions: 1)  Please schedule a follow-up appointment in 2 months 2)  Vit B12 266.20 3)  CBC. Prescriptions: METHYLIN 5 MG TABS (METHYLPHENIDATE HCL) 1 once daily  #30 x 0   Entered by:   Lamar Sprinkles, CMA   Authorized by:   Tresa Garter MD   Signed by:   Lamar Sprinkles,  CMA on 11/22/2009   Method used:   Telephoned to ...       [facility]* (mail-order)             ,          Ph: [phone]       Fax: [phone]   RxID:   [id number] DIOVAN HCT 320-12.5 MG TABS (VALSARTAN-HYDROCHLOROTHIAZIDE) 1 by mouth qd  #90 x 3   Entered by:   Lamar Sprinkles, CMA   Authorized by:   Tresa Garter MD   Signed by:   Lamar Sprinkles, CMA on 11/22/2009   Method used:   Telephoned to ...       [facility]* (mail-order)             ,          Ph: [phone]       Fax: [phone]   RxID:   [id number] NORVASC 10 MG TABS (AMLODIPINE BESYLATE) take one tablet by mouth daily  #90 x 3   Entered by:   Lamar Sprinkles, CMA   Authorized by:   Tresa Garter MD   Signed by:   Lamar Sprinkles, CMA on 11/22/2009   Method used:   Telephoned to ...       [facility]* (mail-order)             ,          Ph: [phone]       Fax: [phone]   RxID:   [id number] ZOCOR 40 MG TABS (SIMVASTATIN) take one tablet by mouth daily  #90 x 3   Entered by:   Lamar Sprinkles, CMA   Authorized by:   Tresa Garter MD   Signed by:   Lamar Sprinkles, CMA on 11/22/2009   Method used:   Telephoned to ...       [facility]* (mail-order)             ,          Ph: [phone]       Fax: [phone]   RxID:   [id number] COBAL-1000 1000 MCG/ML INJ SOLN (CYANOCOBALAMIN) Administer 1cc IM every 2 weeks  #10 cc x 6   Entered and Authorized by:   Tresa Garter MD   Signed by:   Tresa Garter MD on 11/22/2009   Method used:   Print then Give to Patient   RxID:   [id number] BD INSULIN SYRINGE 27G X 1/2" 1 ML MISC (INSULIN SYRINGE-NEEDLE U-100) as dirr  #20 x 6   Entered and Authorized by:   Tresa Garter MD   Signed by:   Tresa Garter MD on 11/22/2009   Method used:   Print then Give to Patient   RxID:   [id number] LYRICA 75 MG CAPS (PREGABALIN) 1 by mouth two times a day  #60 x 6   Entered and Authorized by:   Tresa Garter MD   Signed by:   Tresa Garter MD on 11/22/2009   Method used:   Print then Give to Patient   RxID:   [id number] DURAGESIC-25 25 MCG/HR  PT72 (FENTANYL) 1 patch q 3 days Please,  fill on or after 01/21/10  #10 x 0   Entered and Authorized by:   Tresa Garter MD   Signed by:   Tresa Garter MD on 11/22/2009   Method used:   Print then Give to Patient  RxID:   [id number] DURAGESIC-25 25 MCG/HR  PT72 (FENTANYL) 1 patch q 3 days Please,  fill on or after 12/22/09  #10 x 0   Entered and Authorized by:   Tresa Garter MD   Signed by:   Tresa Garter MD on 11/22/2009   Method used:   Print then Give to Patient   RxID:   [id number] DURAGESIC-25 25 MCG/HR  PT72 (FENTANYL) 1 patch q 3 days Please,  fill on or after 11/21/09  #10 x 0   Entered and Authorized by:   Tresa Garter MD   Signed by:   Tresa Garter MD on 11/22/2009   Method used:   Print then Give to Patient   RxID:   [id number]   Laboratory Results   Blood Tests    Date/Time Reported: Ami Bullins CMA  Nov 22, 2009 1:37 PM   CBG Fasting:: 104mg /dL       Medication Administration  Injection # 1:     Medication: Vit B12 1000 mcg    Diagnosis: VITAMIN B12 DEFICIENCY (ICD-266.2)    Route: IM    Site: L deltoid    Exp Date: 06/2011    Lot #: [id number]    Mfr: American Regent    Patient tolerated injection without complications    Given by: Ami Bullins CMA (Nov 22, 2009 1:38 PM)  Orders Added: 1)  Admin of Therapeutic Inj  intramuscular or subcutaneous [96372] 2)  Vit B12 1000 mcg [J3420] 3)  Est. Patient Level IV [53664]

## 2010-07-29 NOTE — Letter (Signed)
Summary: [name], DPM  [name], DPM   Imported By: Lennie Odor 04/01/2010 15:09:17  _____________________________________________________________________  External Attachment:    Type:   Image     Comment:   External Document

## 2010-07-29 NOTE — Progress Notes (Signed)
  Phone Note Other Incoming   Request: Send information Summary of Call: Records received from [facility] from Dr. Sharolyn Douglas. Records forwarded to Dr. Posey Rea for review.

## 2010-07-29 NOTE — Assessment & Plan Note (Signed)
Summary: 2 MO ROV /NWS   Vital Signs:  Patient profile:   77 year old male Weight:      146 pounds BMI:     25.15 Temp:     97.1 degrees F oral Pulse rate:   50 / minute BP sitting:   146 / 62  (left arm)  Vitals Entered By: Tora Perches (July 16, 2009 1:41 PM) CC: f/u Is Patient Diabetic? No CBG Result 105   Primary Care Provider:  Plotnkov  CC:  f/u.  History of Present Illness: The patient presents for a follow up of hypertension, diabetes, hyperlipidemia, LBP. C/o URI   Preventive Screening-Counseling & Management  Alcohol-Tobacco     Smoking Status: current  Current Medications (verified): 1)  Glucotrol Xl 10 Mg  Tb24 (Glipizide) .Marland Kitchen.. 1 By Mouth Once Daily 2)  Klonopin 0.5 Mg  Tabs (Clonazepam) .Marland Kitchen.. 1 By Mouth Two Times A Day As Needed 3)  Klor-Con 10 10 Meq Tbcr (Potassium Chloride) .Marland Kitchen.. 1 Po Qd 4)  Vitamin D3 1000 Unit  Tabs (Cholecalciferol) .... 2 Once Daily By Mouth 5)  Atrovent 0.06 %  Soln (Ipratropium Bromide) .... 2 Spr Each Nostr. Qid As Needed Runny Nose 6)  Zolpidem Tartrate 10 Mg  Tabs (Zolpidem Tartrate) .... 1/2 or 1 By Mouth At [facility] Prn 7)  Duragesic-25 25 Mcg/hr  Pt72 (Fentanyl) .Marland Kitchen.. 1 Patch Q 3 Days Please,  Fill On or After 05/24/10 8)  Cobal-1000 1000 Mcg/ml Inj Soln (Cyanocobalamin) .... Administer 1cc Im Every 4 Weeks 9)  Metformin Hcl 500 Mg Tabs (Metformin Hcl) .... Take 1 Tab Twice Daily 10)  Benicar Hct 40-12.5 Mg Tabs (Olmesartan Medoxomil-Hctz) .Marland Kitchen.. 1 Tablet By Mouth Daily 11)  Onetouch Ultra Test  Strp (Glucose Blood) .... Two Times A Day 12)  Flector 1.3 % Ptch (Diclofenac Epolamine) .... Use Qd 13)  Amlodipine Besylate 5 Mg  Tabs (Amlodipine Besylate) .Marland Kitchen.. 1 By Mouth Every Day 14)  Atenolol 50 Mg Tabs (Atenolol) .Marland Kitchen.. 1 Lab Two Times A Day 15)  Voltaren 1 %  Gel (Diclofenac Sodium) .... Two Times A Day  To Qid As Needed 16)  Trazodone Hcl 50 Mg Tabs (Trazodone Hcl) .Marland Kitchen.. 1 By Mouth At Bedtime  Allergies: 1)  Citalopram Hydrobromide  (Citalopram Hydrobromide)  Review of Systems  The patient denies chest pain and hemoptysis.         LBP  Physical Exam  General:  alert, overweight-appearing, and not uncomfortable-appearing.   Nose:  Erythematous throat mucosa and intranasal erythema.  Mouth:  Not dry Lungs:  Normal respiratory effort, chest expands symmetrically. Lungs are clear to auscultation, no crackles or wheezes. Heart:  Normal rate and regular rhythm.  Abdomen:  Bowel sounds positive,abdomen soft and non-tender without masses, organomegaly or hernias noted. Msk:  Lumbar-sacral spine is tender to palpation over paraspinal muscles, ischial bones and painfull with the ROM He is in a w/c Extremities:  No edema Neurologic:  Weak B grip 5-/5 Skin:  Intact without suspicious lesions or rashes Psych:  depressed affect.  not suicidal.     Impression & Recommendations:  Problem # 1:  GAIT DISTURBANCE (ICD-781.2) Assessment Unchanged  Problem # 2:  LOW BACK PAIN (ICD-724.2) Assessment: Unchanged  His updated medication list for this problem includes:    Duragesic-25 25 Mcg/hr Pt72 (Fentanyl) .Marland Kitchen... 1 patch q 3 days please,  fill on or after 09/21/09  Problem # 3:  WEAKNESS (ICD-780.79) Assessment: Unchanged The labs were reviewed with the patient. Drink more  water  Problem # 4:  VITAMIN B12 DEFICIENCY (ICD-266.2) Assessment: Unchanged  Orders: Vit B12 1000 mcg (J3420) Admin of Therapeutic Inj  intramuscular or subcutaneous (16109)  Complete Medication List: 1)  Glucotrol Xl 10 Mg Tb24 (Glipizide) .Marland Kitchen.. 1 by mouth once daily 2)  Klonopin 0.5 Mg Tabs (Clonazepam) .Marland Kitchen.. 1 by mouth two times a day as needed 3)  Klor-con 10 10 Meq Tbcr (Potassium chloride) .Marland Kitchen.. 1 po qd 4)  Vitamin D3 1000 Unit Tabs (Cholecalciferol) .... 2 once daily by mouth 5)  Atrovent 0.06 % Soln (Ipratropium bromide) .... 2 spr each nostr. qid as needed runny nose 6)  Zolpidem Tartrate 10 Mg Tabs (Zolpidem tartrate) .... 1/2 or 1 by  mouth at hs prn 7)  Duragesic-25 25 Mcg/hr Pt72 (Fentanyl) .Marland Kitchen.. 1 patch q 3 days please,  fill on or after 09/21/09 8)  Cobal-1000 1000 Mcg/ml Inj Soln (Cyanocobalamin) .... Administer 1cc im every 4 weeks 9)  Metformin Hcl 500 Mg Tabs (Metformin hcl) .... Take 1 tab twice daily 10)  Benicar Hct 40-12.5 Mg Tabs (Olmesartan medoxomil-hctz) .Marland Kitchen.. 1 tablet by mouth daily 11)  Onetouch Ultra Test Strp (Glucose blood) .... Two times a day 12)  Flector 1.3 % Ptch (Diclofenac epolamine) .... Use qd 13)  Amlodipine Besylate 5 Mg Tabs (Amlodipine besylate) .Marland Kitchen.. 1 by mouth every day 14)  Atenolol 50 Mg Tabs (Atenolol) .Marland Kitchen.. 1 lab two times a day 15)  Voltaren 1 % Gel (Diclofenac sodium) .... Two times a day  to qid as needed 16)  Trazodone Hcl 50 Mg Tabs (Trazodone hcl) .Marland Kitchen.. 1 by mouth at bedtime 17)  Zithromax Z-pak 250 Mg Tabs (Azithromycin) .... As dirrected  Patient Instructions: 1)  Please schedule a follow-up appointment in 2 months. Prescriptions: DURAGESIC-25 25 MCG/HR  PT72 (FENTANYL) 1 patch q 3 days Please,  fill on or after 09/21/09  #10 x 0   Entered and Authorized by:   Tresa Garter MD   Signed by:   Tresa Garter MD on 07/16/2009   Method used:   Print then Give to Patient   RxID:   [id number] ZITHROMAX Z-PAK 250 MG TABS (AZITHROMYCIN) as dirrected  #1 x 0   Entered and Authorized by:   Tresa Garter MD   Signed by:   Tresa Garter MD on 07/16/2009   Method used:   Print then Give to Patient   RxID:   [id number] DURAGESIC-25 25 MCG/HR  PT72 (FENTANYL) 1 patch q 3 days Please,  fill on or after 08/24/09  #10 x 0   Entered and Authorized by:   Tresa Garter MD   Signed by:   Tresa Garter MD on 07/16/2009   Method used:   Print then Give to Patient   RxID:   [id number]    Medication Administration  Injection # 1:    Medication: Vit B12 1000 mcg    Diagnosis: VITAMIN B12 DEFICIENCY (ICD-266.2)    Route: IM    Site: L  deltoid    Exp Date: 04/2011    Lot #: [id number]    Mfr: American Regent    Comments: given    Patient tolerated injection without complications    Given by: Tora Perches (July 16, 2009 1:47 PM)  Orders Added: 1)  Vit B12 1000 mcg [J3420] 2)  Admin of Therapeutic Inj  intramuscular or subcutaneous [96372] 3)  Est. Patient Level IV [25366]

## 2010-07-29 NOTE — Assessment & Plan Note (Signed)
Summary: B-12 / AVP Natale Milch   Nurse Visit   Allergies: 1)  Citalopram Hydrobromide (Citalopram Hydrobromide) 2)  Cymbalta (Duloxetine Hcl)  Medication Administration  Injection # 1:    Medication: Vit B12 1000 mcg    Diagnosis: VITAMIN B12 DEFICIENCY (ICD-266.2)    Route: IM    Site: L deltoid    Exp Date: 06/30/2011    Lot #: [id number]    Mfr: American Regent    Patient tolerated injection without complications    Given by: Lamar Sprinkles, CMA (October 17, 2009 3:30 PM)  Orders Added: 1)  Vit B12 1000 mcg [J3420] 2)  Admin of Therapeutic Inj  intramuscular or subcutaneous [96372]   Medication Administration  Injection # 1:    Medication: Vit B12 1000 mcg    Diagnosis: VITAMIN B12 DEFICIENCY (ICD-266.2)    Route: IM    Site: L deltoid    Exp Date: 06/30/2011    Lot #: [id number]    Mfr: American Regent    Patient tolerated injection without complications    Given by: Lamar Sprinkles, CMA (October 17, 2009 3:30 PM)  Orders Added: 1)  Vit B12 1000 mcg [J3420] 2)  Admin of Therapeutic Inj  intramuscular or subcutaneous [04540]

## 2010-07-29 NOTE — Progress Notes (Signed)
Summary: [facility]   Phone Note From Pharmacy   Caller: [facility] [phone] REF # [phone] Summary of Call: [facility] has question regarding rx.  Initial call taken by: Lamar Sprinkles, CMA,  December 02, 2009 11:15 AM  Follow-up for Phone Call        Provided simvastatin instructions. Follow-up by: Lucious Groves,  December 02, 2009 11:30 AM

## 2010-07-29 NOTE — Letter (Signed)
Summary: [name], DPM  [name], DPM   Imported By: Lennie Odor 04/01/2010 15:09:43  _____________________________________________________________________  External Attachment:    Type:   Image     Comment:   External Document

## 2010-07-29 NOTE — Letter (Signed)
Summary: DME/[facility]  DME/[facility]   Imported By: Lester [place] 04/28/2010 09:07:54  _____________________________________________________________________  External Attachment:    Type:   Image     Comment:   External Document

## 2010-07-31 NOTE — Medication Information (Signed)
Summary: Glucotrol / [facility]  Glucotrol / [facility]   Imported By: Lennie Odor 06/16/2010 16:15:24  _____________________________________________________________________  External Attachment:    Type:   Image     Comment:   External Document

## 2010-07-31 NOTE — Letter (Signed)
Summary: Otolaryngology/[facility]  Otolaryngology/[facility]   Imported By: Sherian Rein 06/16/2010 10:03:54  _____________________________________________________________________  External Attachment:    Type:   Image     Comment:   External Document

## 2010-07-31 NOTE — Progress Notes (Signed)
Summary: Rf Ambien  Phone Note Refill Request Message from:  Fax from Pharmacy  Refills Requested: Medication #1:  ZOLPIDEM TARTRATE 10 MG  TABS 1/2 or 1 by mouth at hs prn   Dosage confirmed as above?Dosage Confirmed   Supply Requested: [facility]  [phone]   Method Requested: Fax to [facility] Away Pharmacy Initial call taken by: Lanier Prude, [facility]),  July 21, 2010 11:25 AM  Follow-up for Phone Call        ok #90 and 1 ref Follow-up by: Tresa Garter MD,  July 21, 2010 6:08 PM  Additional Follow-up for Phone Call Additional follow up Details #1::        Rx faxed to [facility] @ [phone] EMR updated Additional Follow-up by: Orlan Leavens RMA,  July 22, 2010 11:13 AM    Prescriptions: ZOLPIDEM TARTRATE 10 MG  TABS (ZOLPIDEM TARTRATE) 1/2 or 1 by mouth at hs prn  #90 x 1   Entered by:   Orlan Leavens RMA   Authorized by:   Tresa Garter MD   Signed by:   Orlan Leavens RMA on 07/22/2010   Method used:   Printed then faxed to ...       [facility]  [address] [phone]* (retail)       [address]       [place], [place]  [address]       Ph: [phone]       Fax: [phone]   RxID:   [id number]  Script fax to [facility] not [facility].........07/22/10@11 :13am/LMB

## 2010-07-31 NOTE — Therapy (Signed)
Summary: Audiogram/[facility]  Audiogram/[facility]   Imported By: Sherian Rein 06/16/2010 10:04:51  _____________________________________________________________________  External Attachment:    Type:   Image     Comment:   External Document

## 2010-07-31 NOTE — Letter (Signed)
Summary: [facility]  [facility]   Imported By: Sherian Rein 06/16/2010 10:06:12  _____________________________________________________________________  External Attachment:    Type:   Image     Comment:   External Document

## 2010-07-31 NOTE — Assessment & Plan Note (Signed)
Summary: 3 MO ROV /NWS   Vital Signs:  Patient profile:   77 year old male Height:      64 inches Weight:      155 pounds BMI:     26.70 Temp:     98.8 degrees F oral Pulse rate:   68 / minute Pulse rhythm:   regular Resp:     16 per minute BP sitting:   110 / 60  (left arm) Cuff size:   regular  Vitals Entered By: Lanier Prude, Beverly Gust) (June 11, 2010 1:51 PM) CC: 3 mo f/u  Is Patient Diabetic? Yes CBG Result 140   Primary Care Provider:  Plotnkov  CC:  3 mo f/u .  History of Present Illness: The patient presents for a follow up of hypertension, diabetes, hyperlipidemia and LBP  Current Medications (verified): 1)  Duragesic-25 25 Mcg/hr  Pt72 (Fentanyl) .Marland Kitchen.. 1 Patch Q 3 Days Please,  Fill On or After 06/23/10 2)  Glucotrol Xl 10 Mg  Tb24 (Glipizide) .Marland Kitchen.. 1 By Mouth Once Daily 3)  Klonopin 0.5 Mg  Tabs (Clonazepam) .Marland Kitchen.. 1 By Mouth Two Times A Day As Needed 4)  Klor-Con 10 10 Meq Tbcr (Potassium Chloride) .Marland Kitchen.. 1 Po Qd 5)  Vitamin D3 1000 Unit  Tabs (Cholecalciferol) .... 2 Once Daily By Mouth 6)  Zolpidem Tartrate 10 Mg  Tabs (Zolpidem Tartrate) .... 1/2 or 1 By Mouth At [facility] Prn 7)  Cobal-1000 1000 Mcg/ml Inj Soln (Cyanocobalamin) .... Administer 1cc Im Every 2 Weeks 8)  Metformin Hcl 1000 Mg Tabs (Metformin Hcl) .Marland Kitchen.. 1 By Mouth Two Times A Day 9)  Flector 1.3 % Ptch (Diclofenac Epolamine) .... Use Qd 10)  Voltaren 1 %  Gel (Diclofenac Sodium) .... Two Times A Day  To Qid As Needed 11)  Diovan Hct 320-12.5 Mg Tabs (Valsartan-Hydrochlorothiazide) .Marland Kitchen.. 1 By Mouth Qd 12)  Norvasc 10 Mg Tabs (Amlodipine Besylate) .... Take One Tablet By Mouth Daily 13)  Zocor 40 Mg Tabs (Simvastatin) .... Take One Tablet By Mouth Daily 14)  Lyrica 75 Mg Caps (Pregabalin) .Marland Kitchen.. 1 By Mouth Two Times A Day 15)  Bd Insulin Syringe 27g X 1/2" 1 Ml Misc (Insulin Syringe-Needle U-100) .... As Dirr 16)  Wellbutrin Sr 100 Mg Xr12h-Tab (Bupropion Hcl) .Marland Kitchen.. 1 By Mouth Q Am 17)  Accu-Chek Multiclix  Lancets  Misc (Lancets) .... Use Qd 18)  Accu-Chek Aviva  Strp (Glucose Blood) .... Check Qd  Allergies (verified): 1)  Citalopram Hydrobromide (Citalopram Hydrobromide) 2)  Cymbalta (Duloxetine Hcl)  Past History:  Past Medical History: Last updated: 09/24/2009 CAROTID ARTERY STENOSIS (ICD-433.10) GAIT DISTURBANCE (ICD-781.2) ANXIETY (ICD-300.00) GERD (ICD-530.81) VITAMIN B12 DEFICIENCY (ICD-266.2) DEPRESSION (ICD-311) OSTEOARTHRITIS (ICD-715.90) DIABETES MELLITUS, TYPE II (ICD-250.00) HYPERTENSION (ICD-401.9) HYPERLIPIDEMIA (ICD-272.4) Vit D def Tremor  No Parkinson's disease per Dr Vickey Huger 2009 ? Organic brain syndrome - Dr Tawni Pummel at [facility] 2010  Social History: Last updated: 09/16/2007 married '53 6 yrs - divorced; Married '68 2 sons '54, '55; 1 daughter '58 work: Airline pilot - Conservator, museum/gallery Lives with wife- I-ADLs Current Smoker, cigars Alcohol use-no  Review of Systems       The patient complains of difficulty walking and depression.  The patient denies weight loss, weight gain, chest pain, and dyspnea on exertion.    Physical Exam  General:  alert, overweight-appearing, and not uncomfortable-appearing.   Head:  Normocephalic and atraumatic without obvious abnormalities. No apparent alopecia or balding. Nose:  Erythematous throat mucosa and intranasal erythema.  Mouth:  Not  dry Abdomen:  Bowel sounds positive,abdomen soft and non-tender without masses, organomegaly or hernias noted. Msk:  Lumbar-sacral spine is tender to palpation over paraspinal muscles, ischial bones and painfull with the ROM He is in a w/c Neurologic:  Weak B grip 5-/5 Skin:  Intact without suspicious lesions or rashes Psych:  depressed affect.  not suicidal.  flat affect and subdued.     Impression & Recommendations:  Problem # 1:  GAIT DISTURBANCE (ICD-781.2) Assessment Deteriorated W/c and a walker  Problem # 2:  LOW BACK PAIN (ICD-724.2) Assessment: Unchanged  His updated  medication list for this problem includes:    Duragesic-25 25 Mcg/hr Pt72 (Fentanyl) .Marland Kitchen... 1 patch q 3 days please,  fill on or after 09/22/10  Problem # 3:  DEPRESSION (ICD-311) Assessment: Unchanged  His updated medication list for this problem includes:    Klonopin 0.5 Mg Tabs (Clonazepam) .Marland Kitchen... 1 by mouth two times a day as needed    Wellbutrin Sr 100 Mg Xr12h-tab (Bupropion hcl) .Marland Kitchen... 1 by mouth q am - RESTART - he was not taking it  Problem # 4:  MEMORY LOSS (ICD-780.93) Assessment: Unchanged Discussed  Problem # 5:  DM (ICD-250.00) Assessment: Unchanged  His updated medication list for this problem includes:    Glucotrol Xl 10 Mg Tb24 (Glipizide) .Marland Kitchen... 1 by mouth once daily    Metformin Hcl 1000 Mg Tabs (Metformin hcl) .Marland Kitchen... 1 by mouth two times a day    Diovan Hct 320-12.5 Mg Tabs (Valsartan-hydrochlorothiazide) .Marland Kitchen... 1 by mouth qd The labs were reviewed with the patient.   Labs Reviewed: Creat: 1.0 (06/05/2010)    Reviewed HgBA1c results: 6.2 (06/05/2010)  6.4 (03/25/2010)  Complete Medication List: 1)  Duragesic-25 25 Mcg/hr Pt72 (Fentanyl) .Marland Kitchen.. 1 patch q 3 days please,  fill on or after 09/22/10 2)  Glucotrol Xl 10 Mg Tb24 (Glipizide) .Marland Kitchen.. 1 by mouth once daily 3)  Klonopin 0.5 Mg Tabs (Clonazepam) .Marland Kitchen.. 1 by mouth two times a day as needed 4)  Klor-con 10 10 Meq Tbcr (Potassium chloride) .Marland Kitchen.. 1 po qd 5)  Vitamin D3 1000 Unit Tabs (Cholecalciferol) .... 2 once daily by mouth 6)  Zolpidem Tartrate 10 Mg Tabs (Zolpidem tartrate) .... 1/2 or 1 by mouth at hs prn 7)  Cobal-1000 1000 Mcg/ml Inj Soln (Cyanocobalamin) .... Administer 1cc im every 2 weeks 8)  Metformin Hcl 1000 Mg Tabs (Metformin hcl) .Marland Kitchen.. 1 by mouth two times a day 9)  Flector 1.3 % Ptch (Diclofenac epolamine) .... Use qd 10)  Voltaren 1 % Gel (Diclofenac sodium) .... Two times a day  to qid as needed 11)  Diovan Hct 320-12.5 Mg Tabs (Valsartan-hydrochlorothiazide) .Marland Kitchen.. 1 by mouth qd 12)  Norvasc 10 Mg Tabs  (Amlodipine besylate) .... Take one tablet by mouth daily 13)  Zocor 40 Mg Tabs (Simvastatin) .... Take one tablet by mouth daily 14)  Lyrica 75 Mg Caps (Pregabalin) .Marland Kitchen.. 1 by mouth two times a day 15)  Bd Insulin Syringe 27g X 1/2" 1 Ml Misc (Insulin syringe-needle u-100) .... As dirr 16)  Wellbutrin Sr 100 Mg Xr12h-tab (Bupropion hcl) .Marland Kitchen.. 1 by mouth q am 17)  Accu-chek Multiclix Lancets Misc (Lancets) .... Use qd 18)  Accu-chek Aviva Strp (Glucose blood) .... Check qd  Other Orders: Capillary Blood Glucose/CBG (04540) Vit B12 1000 mcg (J3420) Admin of Therapeutic Inj  intramuscular or subcutaneous (98119)  Patient Instructions: 1)  Please schedule a follow-up appointment in 3 months well w/labs and A1c 250.00. Prescriptions: GLUCOTROL  XL 10 MG  TB24 (GLIPIZIDE) 1 by mouth once daily  #90 x 3   Entered and Authorized by:   Tresa Garter MD   Signed by:   Tresa Garter MD on 06/11/2010   Method used:   Print then Give to Patient   RxID:   [id number] LYRICA 75 MG CAPS (PREGABALIN) 1 by mouth two times a day  #180 x 1   Entered and Authorized by:   Tresa Garter MD   Signed by:   Tresa Garter MD on 06/11/2010   Method used:   Print then Give to Patient   RxID:   [id number] KLONOPIN 0.5 MG  TABS (CLONAZEPAM) 1 by mouth two times a day as needed  #180 x 1   Entered and Authorized by:   Tresa Garter MD   Signed by:   Tresa Garter MD on 06/11/2010   Method used:   Print then Give to Patient   RxID:   [id number] DURAGESIC-25 25 MCG/HR  PT72 (FENTANYL) 1 patch q 3 days Please,  fill on or after 09/22/10  #10 x 0   Entered and Authorized by:   Tresa Garter MD   Signed by:   Tresa Garter MD on 06/11/2010   Method used:   Print then Give to Patient   RxID:   [id number] DURAGESIC-25 25 MCG/HR  PT72 (FENTANYL) 1 patch q 3 days Please,  fill on or after 08/24/10  #10 x 0   Entered and Authorized by:   Tresa Garter MD   Signed by:   Tresa Garter MD on 06/11/2010   Method used:   Print then Give to Patient   RxID:   [id number] DURAGESIC-25 25 MCG/HR  PT72 (FENTANYL) 1 patch q 3 days Please,  fill on or after 07/24/10  #10 x 0   Entered and Authorized by:   Tresa Garter MD   Signed by:   Tresa Garter MD on 06/11/2010   Method used:   Print then Give to Patient   RxID:   [id number]    Medication Administration  Injection # 1:    Medication: Vit B12 1000 mcg    Diagnosis: VITAMIN B12 DEFICIENCY (ICD-266.2)    Route: IM    Site: L deltoid    Exp Date: 01/28/2012    Lot #: [id number]    Mfr: American Regent    Patient tolerated injection without complications    Given by: Lanier Prude, CMA(AAMA) (June 11, 2010 3:39 PM)  Orders Added: 1)  Capillary Blood Glucose/CBG [82948] 2)  Vit B12 1000 mcg [J3420] 3)  Admin of Therapeutic Inj  intramuscular or subcutaneous [96372] 4)  Est. Patient Level IV [25427]

## 2010-08-21 ENCOUNTER — Other Ambulatory Visit: Payer: Self-pay | Admitting: Internal Medicine

## 2010-08-21 ENCOUNTER — Ambulatory Visit (INDEPENDENT_AMBULATORY_CARE_PROVIDER_SITE_OTHER): Payer: Medicare Other | Admitting: Internal Medicine

## 2010-08-21 ENCOUNTER — Ambulatory Visit (INDEPENDENT_AMBULATORY_CARE_PROVIDER_SITE_OTHER)
Admission: RE | Admit: 2010-08-21 | Discharge: 2010-08-21 | Disposition: A | Discharge status: Home / Self Care | Payer: Medicare Other | Source: Ambulatory Visit | Attending: Internal Medicine | Admitting: Internal Medicine

## 2010-08-21 ENCOUNTER — Encounter: Payer: Self-pay | Admitting: Internal Medicine

## 2010-08-21 DIAGNOSIS — I10 Essential (primary) hypertension: Secondary | ICD-10-CM

## 2010-08-21 DIAGNOSIS — R413 Other amnesia: Secondary | ICD-10-CM

## 2010-08-21 DIAGNOSIS — E119 Type 2 diabetes mellitus without complications: Secondary | ICD-10-CM

## 2010-08-21 DIAGNOSIS — E785 Hyperlipidemia, unspecified: Secondary | ICD-10-CM

## 2010-08-21 DIAGNOSIS — E538 Deficiency of other specified B group vitamins: Secondary | ICD-10-CM

## 2010-08-21 DIAGNOSIS — J069 Acute upper respiratory infection, unspecified: Secondary | ICD-10-CM

## 2010-08-21 LAB — CONVERTED CEMR LAB: Blood Glucose, Fingerstick: 95

## 2010-09-04 NOTE — Assessment & Plan Note (Signed)
Summary: congestion   Vital Signs:  Patient profile:   77 year old male Height:      64 inches Weight:      152 pounds BMI:     26.19 Temp:     98.7 degrees F oral Pulse rate:   64 / minute Pulse rhythm:   regular Resp:     16 per minute BP sitting:   128 / 68  (left arm) Cuff size:   regular  Vitals Entered By: Lanier Prude, CMA(AAMA) (August 21, 2010 11:41 AM) CC: congestion,wheezing, eyes watery, SOB X 2-3 wks Is Patient Diabetic? Yes CBG Result 95  .  Primary Care Provider:  Plotnkov  CC:  congestion, wheezing, eyes watery, and SOB X 2-3 wks.  History of Present Illness: The patient presents with complaints of sore throat, fever, cough, sinus congestion and drainge of several days duration. Not better with OTC meds. . Can't sleep due to cough. Muscle aches are present - not new.  The mucus is colored.  The patient presents for a follow up of hypertension, diabetes, hyperlipidemia and LBP/OA  Current Medications (verified): 1)  Duragesic-25 25 Mcg/hr  Pt72 (Fentanyl) .Marland Kitchen.. 1 Patch Q 3 Days Please,  Fill On or After 09/22/10 2)  Glucotrol Xl 10 Mg  Tb24 (Glipizide) .Marland Kitchen.. 1 By Mouth Once Daily 3)  Klonopin 0.5 Mg  Tabs (Clonazepam) .Marland Kitchen.. 1 By Mouth Two Times A Day As Needed 4)  Klor-Con 10 10 Meq Tbcr (Potassium Chloride) .Marland Kitchen.. 1 Po Qd 5)  Vitamin D3 1000 Unit  Tabs (Cholecalciferol) .... 2 Once Daily By Mouth 6)  Zolpidem Tartrate 10 Mg  Tabs (Zolpidem Tartrate) .... 1/2 or 1 By Mouth At [facility] Prn 7)  Cobal-1000 1000 Mcg/ml Inj Soln (Cyanocobalamin) .... Administer 1cc Im Every 2 Weeks 8)  Metformin Hcl 1000 Mg Tabs (Metformin Hcl) .Marland Kitchen.. 1 By Mouth Two Times A Day 9)  Flector 1.3 % Ptch (Diclofenac Epolamine) .... Use Qd 10)  Voltaren 1 %  Gel (Diclofenac Sodium) .... Two Times A Day  To Qid As Needed 11)  Diovan Hct 320-12.5 Mg Tabs (Valsartan-Hydrochlorothiazide) .Marland Kitchen.. 1 By Mouth Qd 12)  Norvasc 10 Mg Tabs (Amlodipine Besylate) .... Take One Tablet By Mouth Daily 13)  Zocor  40 Mg Tabs (Simvastatin) .... Take One Tablet By Mouth Daily 14)  Lyrica 75 Mg Caps (Pregabalin) .Marland Kitchen.. 1 By Mouth Two Times A Day 15)  Bd Insulin Syringe 27g X 1/2" 1 Ml Misc (Insulin Syringe-Needle U-100) .... As Dirr 16)  Wellbutrin Sr 100 Mg Xr12h-Tab (Bupropion Hcl) .Marland Kitchen.. 1 By Mouth Q Am 17)  Accu-Chek Multiclix Lancets  Misc (Lancets) .... Use Qd 18)  Accu-Chek Aviva  Strp (Glucose Blood) .... Check Qd  Allergies (verified): 1)  Citalopram Hydrobromide (Citalopram Hydrobromide) 2)  Cymbalta (Duloxetine Hcl)  Past History:  Past Medical History: Last updated: 09/24/2009 CAROTID ARTERY STENOSIS (ICD-433.10) GAIT DISTURBANCE (ICD-781.2) ANXIETY (ICD-300.00) GERD (ICD-530.81) VITAMIN B12 DEFICIENCY (ICD-266.2) DEPRESSION (ICD-311) OSTEOARTHRITIS (ICD-715.90) DIABETES MELLITUS, TYPE II (ICD-250.00) HYPERTENSION (ICD-401.9) HYPERLIPIDEMIA (ICD-272.4) Vit D def Tremor  No Parkinson's disease per Dr Vickey Huger 2009 ? Organic brain syndrome - Dr Tawni Pummel at [facility] 2010  Social History: Last updated: 09/16/2007 married '53 6 yrs - divorced; Married '68 2 sons '54, '55; 1 daughter '58 work: Airline pilot - Conservator, museum/gallery Lives with wife- I-ADLs Current Smoker, cigars Alcohol use-no  Physical Exam  General:  alert, overweight-appearing, and not uncomfortable-appearing.   Eyes:  pupils equal.   Mouth:  Erythematous throat and  intranasal mucosa c/w URI  Neck:  No deformities, masses, or tenderness noted. Lungs:  Normal respiratory effort, chest expands symmetrically. Lungs are clear to auscultation, no crackles or wheezes. Heart:  Normal rate and regular rhythm.  Abdomen:  Bowel sounds positive,abdomen soft and non-tender without masses, organomegaly or hernias noted. Msk:  Lumbar-sacral spine is tender to palpation over paraspinal muscles, ischial bones and painfull with the ROM He is in a w/c Neurologic:  Weak B grip 5-/5 Skin:  Intact without suspicious lesions or rashes Psych:   depressed affect.  not suicidal.  flat affect and subdued.     Impression & Recommendations:  Problem # 1:  UPPER RESPIRATORY INFECTION, ACUTE (ICD-465.9) Assessment New Zpac See "Patient Instructions".  Orders: T-2 View CXR, Same Day (71020.5TC)  Problem # 2:  MEMORY LOSS (ICD-780.93) Assessment: Unchanged  Problem # 3:  DIABETES MELLITUS, TYPE II (ICD-250.00) Assessment: Unchanged  His updated medication list for this problem includes:    Glucotrol Xl 10 Mg Tb24 (Glipizide) .Marland Kitchen... 1 by mouth once daily    Metformin Hcl 1000 Mg Tabs (Metformin hcl) .Marland Kitchen... 1 by mouth two times a day    Diovan Hct 320-12.5 Mg Tabs (Valsartan-hydrochlorothiazide) .Marland Kitchen... 1 by mouth qd  Orders: Capillary Blood Glucose/CBG (04540)  Problem # 4:  WEAKNESS (ICD-780.79) Assessment: Deteriorated Hold simvastatin x1-2 wks  Problem # 5:  OSTEOARTHRITIS (ICD-715.90) Assessment: Unchanged  His updated medication list for this problem includes:    Duragesic-25 25 Mcg/hr Pt72 (Fentanyl) .Marland Kitchen... 1 patch q 3 days please,  fill on or after 10/23/10  Problem # 6:  VITAMIN B12 DEFICIENCY (ICD-266.2) Assessment: Unchanged  Orders: Vit B12 1000 mcg (J3420) Admin of Therapeutic Inj  intramuscular or subcutaneous (98119)  Complete Medication List: 1)  Duragesic-25 25 Mcg/hr Pt72 (Fentanyl) .Marland Kitchen.. 1 patch q 3 days please,  fill on or after 10/23/10 2)  Glucotrol Xl 10 Mg Tb24 (Glipizide) .Marland Kitchen.. 1 by mouth once daily 3)  Klonopin 0.5 Mg Tabs (Clonazepam) .Marland Kitchen.. 1 by mouth two times a day as needed 4)  Klor-con 10 10 Meq Tbcr (Potassium chloride) .Marland Kitchen.. 1 po qd 5)  Vitamin D3 1000 Unit Tabs (Cholecalciferol) .... 2 once daily by mouth 6)  Zolpidem Tartrate 10 Mg Tabs (Zolpidem tartrate) .... 1/2 or 1 by mouth at hs prn 7)  Cobal-1000 1000 Mcg/ml Inj Soln (Cyanocobalamin) .... Administer 1cc im every 2 weeks 8)  Metformin Hcl 1000 Mg Tabs (Metformin hcl) .Marland Kitchen.. 1 by mouth two times a day 9)  Flector 1.3 % Ptch (Diclofenac  epolamine) .... Use qd 10)  Voltaren 1 % Gel (Diclofenac sodium) .... Two times a day  to qid as needed 11)  Diovan Hct 320-12.5 Mg Tabs (Valsartan-hydrochlorothiazide) .Marland Kitchen.. 1 by mouth qd 12)  Norvasc 10 Mg Tabs (Amlodipine besylate) .... Take one tablet by mouth daily 13)  Zocor 40 Mg Tabs (Simvastatin) .... Take one tablet by mouth daily 14)  Lyrica 75 Mg Caps (Pregabalin) .Marland Kitchen.. 1 by mouth two times a day 15)  Bd Insulin Syringe 27g X 1/2" 1 Ml Misc (Insulin syringe-needle u-100) .... As dirr 16)  Wellbutrin Sr 100 Mg Xr12h-tab (Bupropion hcl) .Marland Kitchen.. 1 by mouth q am 17)  Accu-chek Multiclix Lancets Misc (Lancets) .... Use qd 18)  Accu-chek Aviva Strp (Glucose blood) .... Check qd 19)  Zithromax Z-pak 250 Mg Tabs (Azithromycin) .... As dirrected 20)  Atenolol 50 Mg Tabs (Atenolol) .Marland Kitchen.. 1 by mouth bid  Patient Instructions: 1)  Keep return office visit 2)  Use over-the-counter medicines for "cold": Tylenol  650mg  or Advil 400mg  every 6 hours  for fever; Delsym or Robutussin for cough. Mucinex  for congestion. Ricola or Halls for sore throat. Office visit if not better or if worse.  Prescriptions: DURAGESIC-25 25 MCG/HR  PT72 (FENTANYL) 1 patch q 3 days Please,  fill on or after 10/23/10  #10 x 0   Entered and Authorized by:   Tresa Garter MD   Signed by:   Tresa Garter MD on 08/21/2010   Method used:   Print then Give to Patient   RxID:   [id number] BD INSULIN SYRINGE 27G X 1/2" 1 ML MISC (INSULIN SYRINGE-NEEDLE U-100) as dirr  #20 x 6   Entered and Authorized by:   Tresa Garter MD   Signed by:   Tresa Garter MD on 08/21/2010   Method used:   Print then Give to Patient   RxID:   [id number] COBAL-1000 1000 MCG/ML INJ SOLN (CYANOCOBALAMIN) Administer 1cc IM every 2 weeks  #10 cc x 6   Entered and Authorized by:   Tresa Garter MD   Signed by:   Tresa Garter MD on 08/21/2010   Method used:   Print then Give to Patient   RxID:    [id number] LYRICA 75 MG CAPS (PREGABALIN) 1 by mouth two times a day  #180 x 1   Entered and Authorized by:   Tresa Garter MD   Signed by:   Tresa Garter MD on 08/21/2010   Method used:   Print then Give to Patient   RxID:   [id number] KLONOPIN 0.5 MG  TABS (CLONAZEPAM) 1 by mouth two times a day as needed  #180 x 1   Entered and Authorized by:   Tresa Garter MD   Signed by:   Tresa Garter MD on 08/21/2010   Method used:   Print then Give to Patient   RxID:   [id number] ATENOLOL 50 MG TABS (ATENOLOL) 1 by mouth bid  #180 x 3   Entered and Authorized by:   Tresa Garter MD   Signed by:   Tresa Garter MD on 08/21/2010   Method used:   Print then Give to Patient   RxID:   [id number] NORVASC 10 MG TABS (AMLODIPINE BESYLATE) take one tablet by mouth daily  #90 x 3   Entered and Authorized by:   Tresa Garter MD   Signed by:   Tresa Garter MD on 08/21/2010   Method used:   Print then Give to Patient   RxID:   [id number] ZOLPIDEM TARTRATE 10 MG  TABS (ZOLPIDEM TARTRATE) 1/2 or 1 by mouth at hs prn  #90 x 1   Entered and Authorized by:   Tresa Garter MD   Signed by:   Tresa Garter MD on 08/21/2010   Method used:   Print then Give to Patient   RxID:   [id number] ZITHROMAX Z-PAK 250 MG TABS (AZITHROMYCIN) as dirrected  #1 x 0   Entered and Authorized by:   Tresa Garter MD   Signed by:   Tresa Garter MD on 08/21/2010   Method used:   Electronically to        [facility]  [address] [phone]* (retail)       [address]       [place], [place]  [address]  Ph: [phone]       Fax: [phone]   RxID:   [id number]    Medication Administration  Injection # 1:    Medication: Vit B12 1000 mcg    Diagnosis: VITAMIN B12 DEFICIENCY (ICD-266.2)    Route: IM    Site: L deltoid    Exp Date: 04/29/2012    Lot #: [id number]    Mfr: American Regent     Patient tolerated injection without complications    Given by: Lanier Prude, CMA(AAMA) (August 21, 2010 11:58 AM)  Orders Added: 1)  Capillary Blood Glucose/CBG [82948] 2)  T-2 View CXR, Same Day [71020.5TC] 3)  Vit B12 1000 mcg [J3420] 4)  Admin of Therapeutic Inj  intramuscular or subcutaneous [96372] 5)  Est. Patient Level IV [84132]

## 2010-09-08 ENCOUNTER — Other Ambulatory Visit: Payer: Self-pay

## 2010-09-09 ENCOUNTER — Encounter: Payer: Self-pay | Admitting: Internal Medicine

## 2010-09-15 ENCOUNTER — Encounter: Payer: Self-pay | Admitting: Internal Medicine

## 2010-09-15 LAB — POCT CARDIAC MARKERS
CKMB, poc: 1.2 ng/mL (ref 1.0–8.0)
Troponin i, poc: <0.05 ng/mL (ref 0.00–0.09)
Troponin i, poc: <0.05 ng/mL (ref 0.00–0.09)

## 2010-09-15 LAB — POCT I-STAT, CHEM 8
BUN: 17 mg/dL (ref 6–23)
Calcium, Ion: 1.18 mmol/L (ref 1.12–1.32)
Chloride: 108 mEq/L (ref 96–112)
Potassium: 3 mEq/L — ABNORMAL LOW (ref 3.5–5.1)

## 2010-09-16 NOTE — Medication Information (Signed)
Summary: [facility]abetic Supp[facility] /[facility]                                        Diabetic Supplies /[facility]                                                                      Imported By: Lennie Odor 09/11/2010 14:28:36  _____________________________________________________________________  External Attachment:    Type:   Image     Comment:   External Document

## 2010-10-13 ENCOUNTER — Other Ambulatory Visit: Payer: Self-pay | Admitting: Internal Medicine

## 2010-10-13 ENCOUNTER — Other Ambulatory Visit (INDEPENDENT_AMBULATORY_CARE_PROVIDER_SITE_OTHER): Payer: Medicare Other

## 2010-10-13 ENCOUNTER — Other Ambulatory Visit (INDEPENDENT_AMBULATORY_CARE_PROVIDER_SITE_OTHER): Payer: Medicare Other | Admitting: Internal Medicine

## 2010-10-13 DIAGNOSIS — Z0000 Encounter for general adult medical examination without abnormal findings: Secondary | ICD-10-CM

## 2010-10-13 DIAGNOSIS — Z0389 Encounter for observation for other suspected diseases and conditions ruled out: Secondary | ICD-10-CM

## 2010-10-13 DIAGNOSIS — E119 Type 2 diabetes mellitus without complications: Secondary | ICD-10-CM

## 2010-10-13 DIAGNOSIS — Z125 Encounter for screening for malignant neoplasm of prostate: Secondary | ICD-10-CM

## 2010-10-13 LAB — URINALYSIS, ROUTINE W REFLEX MICROSCOPIC
Bilirubin Urine: NEGATIVE
Hgb urine dipstick: NEGATIVE
Nitrite: POSITIVE
Total Protein, Urine: 100
Urine Glucose: NEGATIVE
pH: 5.5 (ref 5.0–8.0)

## 2010-10-13 LAB — HEPATIC FUNCTION PANEL
AST: 18 U/L (ref 0–37)
Alkaline Phosphatase: 43 U/L (ref 39–117)
Bilirubin, Direct: 0.2 mg/dL (ref 0.0–0.3)
Total Bilirubin: 1.2 mg/dL (ref 0.3–1.2)

## 2010-10-13 LAB — BASIC METABOLIC PANEL
BUN: 18 mg/dL (ref 6–23)
CO2: 28 mEq/L (ref 19–32)
Calcium: 9.3 mg/dL (ref 8.4–10.5)
Creatinine, Ser: 1 mg/dL (ref 0.4–1.5)
Glucose, Bld: 86 mg/dL (ref 70–99)

## 2010-10-13 LAB — CBC WITH DIFFERENTIAL/PLATELET
Basophils Absolute: 0 10*3/uL (ref 0.0–0.1)
Basophils Relative: 0.3 % (ref 0.0–3.0)
Eosinophils Absolute: 0 10*3/uL (ref 0.0–0.7)
HCT: 37.4 % — ABNORMAL LOW (ref 39.0–52.0)
Hemoglobin: 12.8 g/dL — ABNORMAL LOW (ref 13.0–17.0)
Lymphs Abs: 2.3 10*3/uL (ref 0.7–4.0)
MCHC: 34.2 g/dL (ref 30.0–36.0)
MCV: 102.7 fl — ABNORMAL HIGH (ref 78.0–100.0)
Neutro Abs: 5.2 10*3/uL (ref 1.4–7.7)
RBC: 3.65 Mil/uL — ABNORMAL LOW (ref 4.22–5.81)
RDW: 15.2 % — ABNORMAL HIGH (ref 11.5–14.6)

## 2010-10-13 LAB — LIPID PANEL
LDL Cholesterol: 42 mg/dL (ref 0–99)
Total CHOL/HDL Ratio: 3
Triglycerides: 112 mg/dL (ref 0.0–149.0)

## 2010-10-15 ENCOUNTER — Encounter: Payer: Self-pay | Admitting: Internal Medicine

## 2010-10-15 ENCOUNTER — Ambulatory Visit (INDEPENDENT_AMBULATORY_CARE_PROVIDER_SITE_OTHER): Payer: Medicare Other | Admitting: Internal Medicine

## 2010-10-15 VITALS — BP 122/58 | HR 50 | Temp 98.7°F | Resp 12 | Wt 150.5 lb

## 2010-10-15 DIAGNOSIS — L97509 Non-pressure chronic ulcer of other part of unspecified foot with unspecified severity: Secondary | ICD-10-CM

## 2010-10-15 DIAGNOSIS — E538 Deficiency of other specified B group vitamins: Secondary | ICD-10-CM

## 2010-10-15 DIAGNOSIS — Z136 Encounter for screening for cardiovascular disorders: Secondary | ICD-10-CM

## 2010-10-15 DIAGNOSIS — E119 Type 2 diabetes mellitus without complications: Secondary | ICD-10-CM

## 2010-10-15 DIAGNOSIS — C4491 Basal cell carcinoma of skin, unspecified: Secondary | ICD-10-CM

## 2010-10-15 DIAGNOSIS — Z0000 Encounter for general adult medical examination without abnormal findings: Secondary | ICD-10-CM | POA: Insufficient documentation

## 2010-10-15 LAB — GLUCOSE, POCT (MANUAL RESULT ENTRY): POC Glucose: 102

## 2010-10-15 MED ORDER — DICLOFENAC SODIUM 1 % TD GEL: 1.0000 "application " | Freq: Four times a day (QID) | TRANSDERMAL | Status: DC

## 2010-10-15 MED ORDER — PREGABALIN 75 MG PO CAPS: 75.0000 mg | ORAL_CAPSULE | Freq: Two times a day (BID) | ORAL | Status: DC

## 2010-10-15 MED ORDER — VALSARTAN-HYDROCHLOROTHIAZIDE 320-12.5 MG PO TABS: 1.0000 | ORAL_TABLET | Freq: Every day | ORAL | Status: DC

## 2010-10-15 MED ORDER — FENTANYL 25 MCG/HR TD PT72: 1.0000 | MEDICATED_PATCH | TRANSDERMAL | Status: DC

## 2010-10-15 MED ORDER — CLONAZEPAM 0.5 MG PO TABS: 0.5000 mg | ORAL_TABLET | Freq: Two times a day (BID) | ORAL | Status: DC | PRN

## 2010-10-15 MED ORDER — CYANOCOBALAMIN 1000 MCG/ML IJ SOLN
1000.0000 ug | Freq: Once | INTRAMUSCULAR | Status: AC
  Administered 2010-10-15: 1000 ug via INTRAMUSCULAR

## 2010-10-15 MED ORDER — SIMVASTATIN 40 MG PO TABS: 40.0000 mg | ORAL_TABLET | Freq: Every day | ORAL | Status: DC

## 2010-10-15 NOTE — Progress Notes (Signed)
Subjective:    Patient ID: [name], male    DOB: 06-Apr-1925, 77 y.o.   MRN: [id number]  HPI He appears well, in no apparent distress.  Alert and oriented times three, pleasant and cooperative. Vital signs are as documented in vital signs section.    Review of Systems  Constitutional: Positive for fatigue. Negative for fever, chills and appetite change.  HENT: Negative for rhinorrhea, mouth sores and voice change.   Eyes: Negative for photophobia, redness, itching and visual disturbance.  Respiratory: Positive for shortness of breath. Negative for chest tightness and wheezing.   Cardiovascular: Negative for chest pain and leg swelling.  Gastrointestinal: Negative for vomiting, abdominal pain, diarrhea, abdominal distention and anal bleeding.  Genitourinary: Negative for flank pain and penile pain.  Musculoskeletal: Positive for myalgias, back pain and arthralgias. Negative for joint swelling and gait problem.  Skin: Negative for color change and wound.  Neurological: Negative for seizures and facial asymmetry.  Psychiatric/Behavioral: Positive for sleep disturbance and dysphoric mood. Negative for suicidal ideas, behavioral problems, confusion, self-injury and decreased concentration. The patient is nervous/anxious.        Objective:   Physical Exam  Constitutional: He is oriented to person, place, and time. He appears well-developed. No distress.       NAD. He is in a w/c. He is obese  HENT:  Mouth/Throat: Oropharynx is clear and moist.  Eyes: Conjunctivae are normal. Pupils are equal, round, and reactive to light.  Neck: Normal range of motion. No JVD present. No thyromegaly present.  Cardiovascular: Normal rate, regular rhythm, normal heart sounds and intact distal pulses.  Exam reveals no gallop and no friction rub.   No murmur heard. Pulmonary/Chest: Effort normal and breath sounds normal. No respiratory distress. He has no wheezes. He has no rales. He exhibits no  tenderness.  Abdominal: Soft. Bowel sounds are normal. He exhibits no distension and no mass. There is no tenderness. There is no rebound and no guarding.  Musculoskeletal: Normal range of motion. He exhibits no edema and no tenderness.  Lymphadenopathy:    He has no cervical adenopathy.  Neurological: He is alert and oriented to person, place, and time. He displays abnormal reflex. No cranial nerve deficit. He exhibits normal muscle tone. Coordination normal.       Somewhat mask like expression. No tremor.  Skin: Skin is warm and dry. No rash noted.  Psychiatric: His behavior is normal. Judgment and thought content normal.       Sad   R neck - dresed post surgery       Assessment & Plan:  DM w/o Complication Type II On Rx     Well adult exam The patient is here for annual Medicare wellness examination and management of other chronic and acute problems.   The risk factors are reflected in the social history.  The roster of all physicians providing medical care to patient - is listed in the Snapshot section of the chart.  Activities of daily living:  The patient is 100% inedpendent in all ADLs: dressing, toileting, feeding as well as independent mobility  Home safety : The patient has smoke detectors in the home. They wear seatbelts.No firearms at home ( firearms are present in the home, kept in a safe fashion). There is no violence in the home.   There is no risks for hepatitis, STDs or HIV. There is no   history of blood transfusion. They have no travel history to infectious disease endemic areas of  the world.  The patient has seen their dentist in the last six month. They have seen their eye doctor in the last year. They deny (admit to) some hearing difficulty and have not had audiologic testing in the last year.  They do not  have excessive sun exposure. Discussed the need for sun protection: hats, long sleeves and use of sunscreen if there is significant sun exposure.    Diet: the importance of a healthy diet is discussed. They do have a healthy (unhealthy-high fat/fast food) diet.  The patient has no regular exercise program.  The benefits of regular aerobic exercise were discussed.  Depression screen: there are no signs or vegative symptoms of depression- irritability, change in appetite, anhedonia, sadness/tearfullness.  Cognitive assessment: the patient manages all their financial and personal affairs and is actively engaged. They could relate day,date,year and events; recalled 3/3 objects at 3 minutes; performed clock-face test normally.  The following portions of the patient's history were reviewed and updated as appropriate: allergies, current medications, past family history, past medical history,  past surgical history, past social history  and problem list.  Vision, hearing, body mass index were assessed and reviewed.   During the course of the visit the patient was educated and counseled about appropriate screening and preventive services including : fall prevention , diabetes screening, nutrition counseling, colorectal cancer screening, and recommended immunizations.

## 2010-10-15 NOTE — Assessment & Plan Note (Signed)
On Rx 

## 2010-10-15 NOTE — Assessment & Plan Note (Signed)

## 2010-11-11 NOTE — Assessment & Plan Note (Signed)
12/28/06  patient follows up today.  An 77 year old male with history of  lumbar degenerative disk, history of falls, history of coccygeal pain,  after a fall in 2005; degenerative disk pain at L3-4, L4-5, L5-S1 with  degenerative facette joint arthritis on an x-ray on September 04, 2003.  He  had recent bone scan demonstrating no evidence of pulse fracture, and no  other abnormalities other than the expected lumbar spondylosis and left  ankle post traumatic arthropathy.  He has had no new problems in the  interval time; and has not followed up with physical therapy.  In  addition, he has had lumbar MRI dated December 20, 2006, which I was able to  review, demonstrating a degenerative disk L3-4, L4-5, and L5-S1, and  some pronounced facette arthropathy at L4-5 and L5-S1.  No evidence of  pyramidal stenosis on the left side, at those levels, but on the right  side there is some evidence of nerve root encroachment.   The patient denies any right lower extremity symptoms.  His main concern  is his left leg, which hurts when he walks, mainly in the hip area; but  he has some lesser amounts of pain in the thigh and in the ankle.   His pain is rated at about 5/10 and increases with walking as well as  sitting.  He is able to climb up the three steps.  He drives seldomly.  He uses a walker to get in-and-out of the office.  He sometimes uses a  wheelchair for further distances.   REVIEW OF SYSTEMS:  Positive for dizziness, depression, and anxiety.   PAST MEDICAL HISTORY:  Significant for diabetes, fall, stroke, and high  blood pressure.   PHYSICAL EXAMINATION:  VITAL SIGNS:  His blood pressure is 124/56, pulse  53, respiratory rate 16, O2 saturation 97% on room air.  MENTAL STATUS:  His affect was alert.  His gait is a shuffle.  Orientation x3.  BACK:  He has tenderness in the lumbosacral junction bilaterally and  PSIS area as well; and more particularly over the ischial tuberosity on  the left side.   His gait is stenotic appearing, shuffling, wide-based,  short step length, decreased arm swing, i.e., senile gait pattern.  EXTREMITIES:  His lower extremity strength is good.  He has no evidence  of multiple atrophy.  Due to balance reasons he cannot go up on his toes  or up on his heels to ambulate.   IMPRESSION:  Left pelvic and lower extremity pain.  He has known lumbar  stenosis, and his foot appears to be decompressed adequately on the left  side so that we would not expect much symptomatology that way.  He could  have a chronic radiculopathy which has yet to recover three months post  decompression.  In addition, this pain may be referred from other  structures such as lumber facette joints.  Furthermore, he has  tenderness over the ischium on the left side, pain with sitting.  So  that he may have a concomitant ischial bursitis.   PLAN:  1. We will inject the ischial bursa today.  2. We will schedule for EMG NCV left lower extremity to further      evaluate.      Erick Colace, M.D.  Electronically Signed     AEK/MedQ  D:  01/04/2007 17:10:50  T:  01/05/2007 10:39:07  Job #:  [id number]

## 2010-11-11 NOTE — H&P (Signed)
NAMEARISTON, GRANDISON NO.:  [id number]   MEDICAL RECORD NO.:  [id number]          PATIENT TYPE:  INP   LOCATION:  1345                         FACILITY[facility]  [facility]   PHYSICIAN:  Gordy Savers, MDDATE OF BIRTH:  [birth year]   DATE OF ADMISSION:  09/16/2007  DATE OF DISCHARGE:  09/18/2007                              HISTORY & PHYSICAL   FINAL DIAGNOSIS:  Nausea and weakness.   ADDITIONAL DIAGNOSES:  1. Osteoarthritis.  2. Diabetes mellitus.  3. Hypertension.   DISCHARGE MEDICATIONS:  1. Phenergan 25 mg every 6 hours p.r.n. nausea.  2. Prozac 20 mg daily.  3. Atenolol 25 mg daily.  4. Benicar 40/25 daily.  5. Glucotrol XL 10 mg daily.  6. Metformin 1000 mg twice daily.  7. Duragesic 50 every 72 hours.  8. Potassium chloride 10 mEq daily.  9. Ambien 10 mg at bedtime p.r.n. sleep.   MEDICINES HELD AT THE TIME OF DISCHARGE:  Lotrel.   HISTORY OF PRESENT ILLNESS:  The patient is an 77 year old gentleman  with a history of advanced osteoarthritis.  He has been followed closely  in the past by orthopedics as well as neurology.  On the day of  admission, he was seen acutely for progressive weakness, nausea and some  lightheadedness.  He also described increasing pain in the posterior  aspect of the head.  He has a history of falls but none in the past 2  weeks.  He denied any fever, shortness of breath, chest pain, or change  in his bowel habits.   LABORATORY DATA AND HOSPITAL COURSE:  The patient was admitted to the  hospital for further evaluation of his weakness associated with nausea.  Laboratory studies were obtained and were fairly noncontributory.  This  included a sedimentation rate that was normal at 8, vitamin B12 level  was in excess of 2000.  Cardiac panels were negative as were  electrolytes.  BUN and creatinine normal.  CBC revealed normal white  count of 10,000, H&H 13.8, hematocrit 31.1.  Due to the patient's fairly  recent onset of  worsening headaches, a head CT, unenhanced, was  obtained.  This revealed some small vessel changes but otherwise  unremarkable.   The patient's vital signs remained stable during the hospital.  He  remained afebrile.  Orthostatic blood pressure measurements were  obtained and revealed no orthostatic changes.  His O2 saturation was  normal, and his glycemic control remained acceptable.   DISPOSITION:  The patient will be discharged today.  He will return to  his primary care physician for followup later this week.  He also has  orthopedic and neurology followup appointments scheduled.  He will be  discharged on  his medical regimen as listed above.  He was encouraged to increase his  activity level with the use of his four-point walker.   CONDITION ON DISCHARGE:  Stable.      Gordy Savers, MD  Electronically Signed     PFK/MEDQ  D:  09/18/2007  T:  09/18/2007  Job:  [id number]

## 2010-11-11 NOTE — Assessment & Plan Note (Signed)
DATE OF VISIT:  March 04, 2007.   Mr. [name] returns today to go over EMG results and follow up on his  pain complaints.  He has left hip pain rated as 5 out of 10 on average,  constant, increasing with activity.  He is able to climb about three  steps.  He seldom drives. Uses cane or walker.   REVIEW OF SYSTEMS:  Positive for trouble walking, dizziness,  depression/anxiety.  Blood sugars are under good control.   INTERVAL HISTORY:  Has reportedly seen Dr. Posey Rea and states that he  received a type of pain patch from him.   He also was seen by Dr. Ophelia Charter who is referring him to neurology.  He  gave him some samples of Lyrica for trial but patient states he has not  been taking them.  His last MRI of the lumbar spine was performed on  December 20, 2006 showing advanced degenerative disks, L3/4, L4/5, L5/S1.  Postoperative change L5/S1 with a large laminectomy defect.  Left  foraminal stenosis L5/S1. Mild foraminal stenosis L4/5. Lateral disk  perfusion L3/4 possible affecting L3 and L4 roots.  EMG/MCB performed  February 07, 2007 showed delayed sural distal latency which was felt to be  sural distal latency prolongation with normal left tibia motor tibial F  waves and needle exam showing evidence of mild denervation left  posterior tibialis and left medial gastroc.  Paraspinal EMG was normal.   Overall this is felt the sural findings would be more consistent with  his diabetes whereas the left L5 abnormality could be more consistent  with lumbar radiculopathy although given lack of findings in the  paraspinal's would not be diagnostic purely from an EMG stand-point.   EXAMINATION:  VITAL SIGNS:  Blood pressure is 162/77, pulse 50  respiratory rate 18, O2 saturation 98% on room air.  GENERAL:  His affect is appropriate, oriented x3 although during history  he appears to rely on his wife a lot for information and in fact seemed  to forget when he had his surgery and also denied having  previous  problems in his back, even when I shared with him results of lumbar  spine films showing advanced degenerative disk back in 2005, he had a  fall back at that time causing pain as well.   His gait shows no toe drag or knee instability.  Lower extremity  strength is full bilaterally.   IMPRESSION:  Left hip ischial pain, numbness in left lateral thigh  region. Does not appear to be consistent with his MRI given that he has  some right L3/4 lateral recess stenosis whereas these appeared to be  pretty normal on the left side.   In terms of pain management, I did in fact see that he had a Duragesic  12 mcg patch on, already prescribed by Dr. Posey Rea.  He states he will  follow up with Dr. Posey Rea on this.   The patient is followed by neurology and indicated that I can release my  records to neurology to avoid duplication of efforts.  I will see him  back on a p.r.n. basis.  I have started Neurontin 100 mg q.h.s. and go  slowly over a month's  time to x4 daily and patient can see whether this is helpful for him  although he has not been very consistent with his medications.      Erick Colace, M.D.  Electronically Signed     AEK/MedQ  D:  03/04/2007 16:49:33  T:  03/04/2007 21:17:03  Job #:  [id number]   cc:   Veverly Fells. Ophelia Charter, M.D.  Fax: [phone]   Georgina Quint. Plotnikov, MD  [address]  [place]  [place] [address]

## 2010-11-11 NOTE — Assessment & Plan Note (Signed)
[facility]                            CARDIOLOGY OFFICE NOTE   [name], [name]                     MRN:          [id number]  DATE:08/08/2008                            DOB:          [birth year]    [name] is an 77 year old male, who I am asked to evaluate for chest  pain and elevated blood pressure.  The patient was seen in this office  by Dr. Samule Ohm last on February 18, 2004.  Dr. Samule Ohm was seeing him for  palpitations and fatigue.  Note, the patient has had a Myoview performed  last on August 16, 2006.  At that time, the ejection fraction was 64%.  There was no scar or ischemia.  He had carotid Dopplers performed on  July 11, 2008, ordered by Dr. Posey Rea and he had 40-59% bilateral  internal carotid artery stenosis.  The patient recently has had problems  with occasional chest pain.  The pain is in various locations on his  chest.  He describes it as a dull sensation.  It does not radiate.  It  is not pleuritic or positional nor it is related to food.  It is not  exertional.  It lasts for several minutes and resolves spontaneously.  There is no associated nausea, vomiting, shortness of breath, or  diaphoresis.  It has also been noted that his blood pressure has been  elevated recently and Dr. Posey Rea increased his blood pressure  medications.  Because of the above, we were asked to further evaluate.  Note, the patient states that he is not sure whether these pains are  related to his heart or to his chronic pain.   PRESENT MEDICATIONS:  1. Glucophage 1000 mg p.o. b.i.d.  2. Glucotrol XL 100 mg p.o. daily.  3. Benicar HCT 40/25 mg daily.  4. Amlodipine 5 mg p.o. daily.  5. Potassium 20 mEq p.o. daily.  6. Ambien 10 mg p.o. nightly.  7. Fentanyl 25 mcg patch.  8. Fluoxetine 10 mg 1-2 p.o. daily.  9. Tylenol Extra Strength.  10.Lyrica.  11.Flector patch.  12.Vitamin D.  13.Vitamin C.  14.I-Caps.  15.Vitamin B12.  16.Zinc  and lecithin.   He also takes aspirin 81 mg p.o. daily.  He takes clonazepam and  promethazine as needed.   ALLERGIES:  He has no known drug allergies.   SOCIAL HISTORY:  The patient does not smoke nor does he consume alcohol.  He is married.   FAMILY HISTORY:  Significant for his father, who had coronary artery  disease, but it was at a later age in the 59s.  His mother had  congestive heart failure.   PAST MEDICAL HISTORY:  Significant for hypertension as well as  hyperlipidemia.  The patient does have diabetes mellitus.  He also has  chronic low back pain and osteoarthritis.  There is also a history of  depression.  There is also a history of anxiety, gastroesophageal reflux  disease, and vitamin B12 deficiency.  He has had previous vasectomy,  lumbar laminectomy, and transurethral resection of the prostate.   REVIEW OF SYSTEMS:  There is  no headaches, fevers, or chills.  There is  no productive cough or hemoptysis.  There is no dysphagia, odynophagia,  melena, or hematochezia.  There is no dysuria or hematuria.  There is no  rash or seizure activity.  There is no orthopnea, PND, or pedal edema.  The remaining systems are negative.   PHYSICAL EXAMINATION:  VITAL SIGNS:  Blood pressure 158/60 and the pulse  is 55.  He weighs 158 pounds.  GENERAL:  He is well-developed and somewhat frail.  He is in no acute  distress at present.  SKIN:  Warm and dry.  He does not appear to be depressed, although he  does have a flat affect.  There is no peripheral clubbing.  BACK:  Normal.  HEENT:  Normal with normal eyelids.  NECK:  Supple with normal upstroke bilaterally.  No bruits noted.  There  is no jugular vein distention.  I cannot appreciate thyromegaly.  CHEST:  Clear to auscultation with no expansion.  CARDIOVASCULAR:  Regular rate and rhythm.  Normal S1 and S2.  There is a  2/6 systolic murmur at the left sternal border.  S2 is not diminished.  There is also 2/6 systolic murmur at  the apex.  ABDOMEN:  Nontender and nondistended.  Positive bowel sounds.  No  hepatosplenomegaly or no mass appreciated.  There is no abdominal bruit.  He has 1+ femoral pulses bilaterally.  No bruits.  EXTREMITIES:  No edema.  I could palpate no cords.  He has 1+ dorsalis  pedis pulses bilaterally.  NEUROLOGIC:  Grossly intact.   Note, he also has an echocardiogram from August 16, 2006, that showed  normal LV function.  There was a very mild aortic stenosis with a mean  gradient of 7 mmHg.  There was mild mitral regurgitation.  There was  mild biatrial enlargement.  Electrocardiogram shows a sinus rhythm at a  rate of 55.  The axis is normal.  There are nonspecific ST changes.   DIAGNOSES:  1. Chest pain - the patient's symptoms are atypical.  We will plan to      risk stratify with an adenosine Myoview.  If it shows normal      perfusion and low risk, then I would recommend to continue with      medical therapy.  2. Murmur - we will check an echocardiogram to reassess his aortic      stenosis.  3. Hypertension - his blood pressure is mildly elevated today.      However, Dr. Posey Rea recently adjusted his regimen last week.  We      will follow this.  We can increase his amlodipine as needed.  4. History of bradycardia per his wife - she states his pulse location      run in the 40s and 50s.  However, he has not had any presyncope or      syncope with this.  We will follow this expectantly.  5. Cerebrovascular disease - the patient will continue on his aspirin.      He would benefit from a statin long term, but I will leave this to      Dr. Posey Rea.  6. Diabetes mellitus - management per primary care.  7. History of chronic low back pain.  8. History of anxiety and depression.   We will see him back in 1 year if his above studies are unremarkable.     Madolyn Frieze Jens Som, MD, [facility]  Electronically Signed    BSC/MedQ  DD: 08/08/2008  DT: 08/09/2008  Job #: [id number]   cc:    Georgina Quint. Plotnikov, MD

## 2010-11-11 NOTE — Procedures (Signed)
NAMEKAIYU, MIRABAL NO.:  [id number]   MEDICAL RECORD NO.:  [id number]          PATIENT TYPE:  REC   LOCATION:  [facility]                         FACILITY:  [facility]   PHYSICIAN:  Erick Colace, M.D.DATE OF BIRTH:  [birth year]   DATE OF PROCEDURE:  DATE OF DISCHARGE:                               OPERATIVE REPORT   12/28/06   REASON FOR EVALUATION:  Mr. Lammert is an 77 year old male who has left  ischial pain mainly with standing.  He has had problems with poor  balance and has been referred to the [facility], but has not followed through on this.   He has had a bone scan to look for any type of pelvic fracture, which  was performed December 20, 2006; the results were basically consistent with  arthritic changes in the lumbar spine, corroborated with lumbar x-rays  and the left ankle, which corroborated with the posttraumatic arthritis  with history of fracture in that area.   Given his continued pain and tenderness in the ischial area, he elects  to undergo ischial bursal injection.   DESCRIPTION OF PROCEDURE:  The area was marked, prepped with Betadine x3  and alcohol 2, entered with a 25-gauge inch-and-a-half needle, bone  contact made, needle slightly withdrawn and solution containing 0.5 mL  of 40 mg/mL Kenalog and 2.5 mL of 1% lidocaine were injected.  The  patient tolerated the procedure well.   Post-injection instructions were given.      Erick Colace, M.D.  Electronically Signed     AEK/MEDQ  D:  01/04/2007 17:03:32  T:  01/05/2007 09:46:47  Job:  [id number]   cc:   Loraine Leriche C. Ophelia Charter, M.D.  Fax: [phone]   Georgina Quint. Plotnikov, MD  [address]  [place]  [place] [address]

## 2010-11-11 NOTE — Group Therapy Note (Signed)
REQUESTING PHYSICIAN:  Georgina Quint. Plotnikov, MD   HISTORY:  Mr.  [name] is an 77 year old male who has had back  pain following a fall originally in 2005.  X-rays at that time showed  possible fractured coccyx, moderate to severe degenerative disc, L3/4,  L4/5, L5/S1.  He followed up with Dr. Lunette Stands, M.D. and was doing  better in terms of that, when he fell again outside a revolving power  door, landing on his left side with his arm outstretched.  He states he  had a loss of consciousness with this.  He does not report any memory  loss since that time.  His wife states that he has not had any memory  loss either.  He has had some increased neck pain since that time but no  increased incidents of falls.  He however, does have a significant fear  of falling now.  He states that his left hip pain is his worse pain and  it gets worse when he is sitting down for a while and then he gets up.  He has had epidural steroid injections over at [facility].  He  has had a CT of the head following the fall.  A CT of the head just  prior to the fall which showed no evidence of subdural bleed.  He has  had an epidurography for his lumbar epidural injections, left L4/5  interlaminar and L5/S1 interlaminar and no post injection complications.  He was complaining of left ischial pain at that time as well.   REVIEW OF SYSTEMS:  In addition to above, depression, anxiety,  dizziness.   SOCIAL HISTORY:  He is married.  His wife is with him today.   FAMILY HISTORY:  Positive for heart disease, diabetes, high blood  pressure.   PAST MEDICAL HISTORY:  Significant for diabetes, history of CVA and  arthritis.  He has had pre-operative clearance July 21, 2006 per Dr.  Georgina Quint. Plotnikov, MD in regards to L4/5, L5/S1 decompression  surgery.  He also had pre-operative physical therapy which was not  helpful.  He was tried on Duragesic patch every 3 days which was not  helpful post op.   His other past medical history also includes sleep  apnea, on CPAP and controlled hypertension.  He underwent a left L4/5,  left L5/S1 foraminotomy and microdiscectomy, L4/5 and L5/S1 left  microdiscectomy from Dr. Loraine Leriche C. Ophelia Charter, M.D.  He had no post operative  complications.  He has had, perhaps, some relief of pain post  operatively but still complaining of his left hip area.   CURRENT MEDICATIONS:  Metformin, Klor-Con, atenolol, Ambien, glipizide,  Benicar, amlodipine, clonazepam and Celebrex.  He no longer takes  hydrocodone.  He also take Tylenol over-the-counter which does help with  his pain as well.   PHYSICAL EXAMINATION:  His neck has decreased lateral bending and  rotation as well as extension/flexion.  Negative Spurling's maneuver.  He has 2+ reflexes bilateral upper and lower extremities.  His sensation  is intact bilateral upper and lower extremities.  He has a resting  tremor but no evidence of cogwheeling.  His motor strength is 5-/5  bilateral deltoid, biceps, triceps, grip, as well as  hip flexor, knee  extensor, and ankle dorsiflexor.  His movements are slow and unsteady  with a widened base of support.  He has tenderness over the left ischial  tuberosity and less so over the left PSIS.  His Faber's maneuver causes  pain  in the left PSIS area.  Gait is without evidence of toe dragging,  knee instability, it is wide based, in small steps.  No toe drag or knee  instability noted.   IMPRESSION:  1. Left ischial pain, likely traumatic.  Could be consistent with      ischial bursitis.  Given how long it has been going on, I would      like to check a bone scan to see if he had an ischial stress      fracture or sacral insufficiency fracture.  2. Gait disorder with decreased balance.  Would like to send him to      the [facility].  He does have significant fear of falling that      plays in to his overall decreased mobility.  3. Pain management.  Have encouraged him  to use Tylenol if he can get      by with that and use Celebrex on top of that if Tylenol is      ineffective.  I agree with limiting narcotic analgesics given his      propensity to fall.  If his bone scan is negative for fracture, I      would consider an ischial injection.  Also, if this is not helpful      and patient is still complaining of pain going in the left hip,      would consider and EMG NCV to further evaluate.   I reviewed plain xrays from [facility] hospital AP pelvis shows  mild L hip DJD,L4-5, L5-S1 DDD, appears osteopenic.  C spine films show  DDD, anterior osteophytes, facet arthropathy but no foraminal stenosis.  L UE from distal humerus to metacarpals looks normal.      Erick Colace, M.D.  Electronically Signed     AEK/MedQ  D:  12/14/2006 15:59:30  T:  12/15/2006 06:19:50  Job #:  [id number]   cc:   Georgina Quint. Plotnikov, MD  [address]  [place]  [place] [address]

## 2010-11-14 NOTE — Op Note (Signed)
[name], [name]              ACCOUNT NO.:  [id number]   MEDICAL RECORD NO.:  [id number]          PATIENT TYPE:  OIB   LOCATION:  5035                         FACILITY:  [facility]   PHYSICIAN:  Mark C. Ophelia Charter, M.D.    DATE OF BIRTH:  06/28/1925   DATE OF PROCEDURE:  09/06/2006  DATE OF DISCHARGE:                               OPERATIVE REPORT   PREOPERATIVE DIAGNOSIS:  Left L4-5, left L5-S1 foraminal stenosis.   POSTOPERATIVE DIAGNOSIS:  Left L4-5, left L5-S1 foraminal stenosis.   PROCEDURE:  Left L4-5 foraminotomy, microdiskectomy and left L5-S1  microdiskectomy.   SURGEON:  Annell Greening, M.D.   ANESTHESIA:  General plus 6 mL Marcaine to the skin local.   ESTIMATED BLOOD LOSS:  100 mL.   HISTORY:  This 77 year old male has had multiple medical problems  including diabetes, peripheral vascular disease, carotid stenosis,  hypertension, weak disorder, mild Parkinson's, and had progressive left  lower extremity radiculopathy and associated back pain with foraminal  stenosis.  Failed conservative treatment and epidural steroids.  He has  been on a Duragesic patch and is brought in for foraminotomy in order to  help with some of his pain.   DESCRIPTION OF PROCEDURE:  After placing the patient on chest rolls,  standard prep and draping, area squared with towels, Betadine and Vi-  Drape application,  laminectomy sheet, spinal needle was placed at the  L4-5 and L5-S1 levels and a cross-table lateral plain radiograph  confirmed that the needle was on this exactly at the disk base of both  levels.  Incision was made a couple millimeters to the left of midline.  Self-retaining retractor was placed.  A left hemilaminectomy was  performed removing the left L5 lamina.  The bottom one half of L4 was  removed and hypertrophic chunks of ligament were removed posteriorly in  the lateral gutter.  There was some central tightness at L4-5 and with  careful protection using a hockey stick over the  top of the dura after  releasing some scar tissue bands that were causing tightness likely that  due to previous epidurals, chunks of ligament were removed dorsally to  help with the central compression.  Overhanging spurs were removed which  was causing a compression at the L4-5 level, catching the exiting nerve  root just inferior to the disk space and there was combination of  spurring off both the superior and inferior articular facet at this  level.  Once spurs were removed, nerve root was free.  Continued removal  of chunks of hypertrophic ligament was performed down to the L5-S1  level.  Foraminotomy was performed.  Nerve root was freed up and ball-  tip nerve hook was placed on all sides of the nerve root.  No central  compression was present.  Disk at the L4-5 level was then palpated.  The  18-gauge spinal needle was placed in it and a 15-blade was used to  incise the annulus.  Two passes were made with micro pituitary, but  there was minimal disk material removed.  There was some chronic  spurring on the end  plates and this appeared to be old.  There was not  disk herniation component causing the stenosis.  Hockey stick was placed  anterior to the dura and other than the chronic upsweep of the end  plates, there were no anterior areas of compression.  Nerve root was  free of both levels.  After irrigation with saline solution, facets were  inspected and facets were intact.  Operative microscope that had been  draped had been used during the procedure.  As soon as the limb was  started to be removed, it was then  pulled  away.  The operative lights were brought back in and wound was closed  using #1 and #0 Vicryl in the fascia, 2-0 Vicryl subcutaneous tissue, 4-  0 Vicryl subcuticular closure.  Tincture of Benzoin, Steri-Strips,  Marcaine infiltration.  Postop dressing intact.  Instrument count and  needle count were correct.      Mark C. Ophelia Charter, M.D.  Electronically  Signed     MCY/MEDQ  D:  09/06/2006  T:  09/07/2006  Job:  [id number]

## 2010-11-14 NOTE — Op Note (Signed)
[facility]  Patient:    [name], [name]                     MRN: [id number] Adm. Date:  [id number] Attending:  Thermon Leyland                           Operative Report  PREOPERATIVE DIAGNOSIS: 1. Recurrent balanitis. 2. Patients complaint of overly loose scrotum.  POSTOPERATIVE DIAGNOSIS: 1. Recurrent balanitis. 2. Patients complaint of overly loose scrotum.  PROCEDURE PERFORMED:  Circumcision and scrotoplasty.  SURGEON: Barron Alvine, M.D.  ANESTHESIA: IV sedation with local.  INDICATIONS:  Mr. Lyon is a 77 year old with known insulin-dependent diabetes.  He has had recurrent balanitis.  Because of this recurrent problem with his balanitis, we had previously discussed the possibility of circumcision with him, and he elected to proceed with that procedure.  The patient also complained that because of an excessively loose scrotum, there were oftentimes when he used the bathroom that his scrotum would touch the water in the bowl of the bathroom, which became more and more problematic for him.  He wondered if something could be done to tighten up his scrotum.  We talked about the options of possibly excising some scrotal skin or attempting a scrotoplasty to keep the scrotum from being so loose, and he wanted to proceed with that as well.  TECHNIQUE AND FINDINGS: The patient was brought to the operating room where he had some IV sedation.  He was monitored by one of the anesthesiologists and anesthetists.  I performed a penile block, as well as local infiltration of the scrotal skin with a combination of Marcaine and lidocaine.  He was prepped and draped in the usual manner.  The circumcision was done initially.  An incision was made behind the glans penis circumferentially.  This foreskin was then retracted, and a separate incision was made in the mucosal collar.  The sleeve of tissue was then removed with electrocautery, and the skin  edges were reapproximated with interrupted 4-0 Vicryl suture.  A light pressure dressing was applied.  With regard to the scrotum, we elected to make a longitudinal incision through the median raphe over a course of approximately 8 cm.  We then elected to closed this vertical incision horizontally in a reverse Heineke-Mikulicz manner to pull up on the scrotum and remove some of the redundancy.  This was done with a combination of 3-0 Vicryl in an interrupted manner in the deeper layers and then reapproximating the skin with 4-0 Vicryl suture.  The patient appeared to tolerate this procedure well.  There were no obvious complications.  She was brought to the recovery room in a stable condition. DD:  01/26/01 TD:  01/26/01 Job: [id number]

## 2010-11-14 NOTE — Assessment & Plan Note (Signed)
[facility]                           PRIMARY CARE OFFICE NOTE   [name], [name]                     MRN:          [id number]  DATE:07/21/2006                            DOB:          December 03, 1924    Consultation requested by Dr. Ophelia Charter and Dr. Charlett Blake.   REASON:  Medical clearance for L4-5 and L5-S1 decompression surgery.   HISTORY:  The patient is a 77 year old male with multiple medical  problems who presents to discuss potential risks with surgery.  He had  an accident with a rotating door and fell on February 11, 2006.  Since  then, he has been having intractable low back pain which tremendously  limits his mobility and impaires the quality of life.  He is very  desperate to have it fixed.  He has no active complaints of chest pain  or shortness of breath.   PAST MEDICAL HISTORY:  Diabetes, peripheral vascular disease with  carotid stenosis.  Hypertension, chronic weakness and gait disorder with  mild Parkinsonian features, chronic fatigue, anxiety and depression,  possible dementia, obstructive sleep apnea.   CURRENT MEDICATIONS:  1. Prozac 10 mg daily.  2. Atenolol 1/2 of a 50 mg tablet daily.  3. Benicar/HCTZ 40/25 daily.  4. Lotrel 10/20 daily.  5. Glucotrol XL 10 mg daily.  6. Glucophage 1000 mg b.i.d.  7. We tried Duragesic 25 mcg every 3 days.  8. Temazepam N 30 mg at night.   SOCIAL HISTORY:  He is married with children.  He has a very supportive  wife.  Smokes occasional cigar.  He is retired.   ALLERGIES:  No known drug allergies.   FAMILY HISTORY:  Remarkable for heart disease and arthritis.   REVIEW OF SYSTEMS:  Chronic back pain, depression, slow movement, able  to ambulate with a walker only.  Chronic fatigue and weakness.  No  syncope, denies chest pain or shortness of breath.  Chronic severe pain  in the back, buttocks and legs.  The rest of the 18 point review of  systems is as above or negative.   PHYSICAL  EXAMINATION:  Blood pressure 121/65, pulse 67, temperature  99.7, weight 156.  He is an elderly male in no acute distress.  HEENT:  With moist mucosa.  NECK:  Supple.  LUNGS:  Clear, no wheezes or rales.  HEART:  S1, S2.  No murmur, no gallop.  There is some carotid bruit  bilaterally.  ABDOMEN:  Soft, nontender.  No organamegalyor hernia felt.  Lower extremities without edema.  LS spine with good range of motion.  No tremor.  NEUROLOGIC:  Exam is grossly nonfocal.  He is able to ambulate with  small steps with the help of a walker only.  He is depressed, not  suicidal.   LABS:  Sugar today of 121.  ALG 42, testosterone 62.67.   ASSESSMENT AND PLAN:  1. Preoperative evaluation.  He definitely has a higher than average      risk for anesthesia.  With all this medical background we will      probably need to look at longer  recovery and higher than average      rate of postop complications.  However, in the view of him being      absolutely desperate with his current situation, I think going for      a decompression surgery is very reasonable uption.  Currently the      patient perceives his quality of lifeto be extremely low and he is      quite willing to have surgery done.  I would like to obtain a      cardiac echocardiogram and a Cardiolite Persantine stress test,      chest x-ray PA and lateral and the usual preop lab work.  Presuming      those tests are acceptable, we can go ahead with surgery.  2. Peripheral vascular disease with carotid artery stenosis.  He had      bilateral 40% - 59% stenosis.  We will repeat the ultrasound of the      carotids.  3. Kidney cyst.  He is getting a CT scan of the kidneys by the      urologist.  4. Type 2 diabetes, fairly well controlled.  Obtain hemoglobin A1C.  5. Vitamin B12 deficiency.  Increase his B12 shots to twice a month.      Obtain B12 level.  6. Chronic gait disorder with parkinsonian features, chronic.  7. Sleep apnea long  term.  8. Hypertension - controlled.   4-8: continue current treatment.   We will be happy to follow with you in the hospital.     Georgina Quint. Plotnikov, MD  Electronically Signed    AVP/MedQ  DD: 07/23/2006  DT: 07/23/2006  Job #: [id number]   cc:   Lunette Stands, M.D.

## 2010-11-20 ENCOUNTER — Ambulatory Visit (INDEPENDENT_AMBULATORY_CARE_PROVIDER_SITE_OTHER): Payer: Medicare Other | Admitting: Cardiology

## 2010-11-20 ENCOUNTER — Encounter: Payer: Self-pay | Admitting: Cardiology

## 2010-11-20 DIAGNOSIS — I6529 Occlusion and stenosis of unspecified carotid artery: Secondary | ICD-10-CM

## 2010-11-20 DIAGNOSIS — I779 Disorder of arteries and arterioles, unspecified: Secondary | ICD-10-CM

## 2010-11-20 DIAGNOSIS — I498 Other specified cardiac arrhythmias: Secondary | ICD-10-CM

## 2010-11-20 DIAGNOSIS — I10 Essential (primary) hypertension: Secondary | ICD-10-CM

## 2010-11-20 DIAGNOSIS — I359 Nonrheumatic aortic valve disorder, unspecified: Secondary | ICD-10-CM

## 2010-11-20 DIAGNOSIS — I350 Nonrheumatic aortic (valve) stenosis: Secondary | ICD-10-CM

## 2010-11-20 DIAGNOSIS — E785 Hyperlipidemia, unspecified: Secondary | ICD-10-CM

## 2010-11-20 MED ORDER — ATENOLOL 25 MG PO TABS: 25.0000 mg | ORAL_TABLET | Freq: Two times a day (BID) | ORAL | Status: DC

## 2010-11-20 NOTE — Patient Instructions (Signed)
Decrease Atenolol to 25 mg twice a day You are being scheduled for a 2 D echo and a carotid doppler Follow up in 1 year with Dr Jens Som

## 2010-11-20 NOTE — Assessment & Plan Note (Signed)
Continue statin. Lipids and liver monitored by primary care. 

## 2010-11-20 NOTE — Assessment & Plan Note (Signed)
Plan repeat echocardiogram. 

## 2010-11-20 NOTE — Progress Notes (Signed)
HPI: [name] is a pleasant gentleman who I saw in February of 2010 for chest pain and hypertension.  A. Myoview was performed on August 16, 2008. Ejection fraction was 65% and there was no scar or ischemia. An echocardiogram on August 16, 2008 showed normal LV function. There was mild aortic stenosis with a mean gradient of 11 mm of mercury. He had carotid Dopplers performed in January  2011 and he had 40-59% bilateral internal carotid artery stenosis.  Followup was recommended in one year. Since I last saw him in March of 2011, he denies any dyspnea on exertion, orthopnea, PND, pedal edema or syncope. Note he has limited mobility because of left hip pain. He does have decreased energy.  Current Outpatient Prescriptions  Medication Sig Dispense Refill  . amLODipine (NORVASC) 10 MG tablet Take 10 mg by mouth daily.        . Ascorbic Acid (VITAMIN C PO) Take 1 tablet by mouth daily.        Marland Kitchen aspirin 325 MG tablet Take 325 mg by mouth as needed.        Marland Kitchen aspirin 81 MG tablet Take 81 mg by mouth daily.        Marland Kitchen atenolol (TENORMIN) 50 MG tablet Take 50 mg by mouth daily. Take 1 tab QAM & 1/2 tab Qhs       . Cholecalciferol (VITAMIN D3) 1000 UNIT capsule Take 2,000 Units by mouth daily.        . clonazePAM (KLONOPIN) 0.5 MG tablet Take 1 tablet (0.5 mg total) by mouth 2 (two) times daily as needed.  180 tablet  1  . cyanocobalamin 1000 MCG/ML injection Inject 1,000 mcg into the muscle every 8 (eight) weeks.        . diclofenac sodium (VOLTAREN) 1 % GEL Apply 1 application topically 4 (four) times daily. Two to four times daily as needed  300 g  3  . fentaNYL (DURAGESIC) 25 MCG/HR Place 1 patch (25 mcg total) onto the skin every 3 (three) days. Fill on or after 12/23/10  10 patch  0  . fish oil-omega-3 fatty acids 1000 MG capsule Take 1 g by mouth every other day.        Marland Kitchen glipiZIDE (GLUCOTROL) 10 MG 24 hr tablet Take 10 mg by mouth daily.        Marland Kitchen glucose blood (ACCU-CHEK AVIVA) test strip 1 each by  Other route daily as needed. Use as instructed       . Insulin Syringe-Needle U-100 27G X 1/2" 1 ML MISC by Does not apply route as directed.        . Lancets Misc. (ACCU-CHEK MULTICLIX LANCET DEV) KIT by Does not apply route daily.        . metFORMIN (GLUCOPHAGE) 1000 MG tablet Take 1,000 mg by mouth 2 (two) times daily.        . Multiple Vitamins-Minerals (ICAPS) CAPS 1 tab po qd       . potassium chloride (KLOR-CON) 10 MEQ CR tablet Take 10 mEq by mouth daily.        . pregabalin (LYRICA) 75 MG capsule Take 1 capsule (75 mg total) by mouth 2 (two) times daily.  180 capsule  3  . simvastatin (ZOCOR) 40 MG tablet Take 1 tablet (40 mg total) by mouth at bedtime.  90 tablet  3  . valsartan-hydrochlorothiazide (DIOVAN-HCT) 320-12.5 MG per tablet Take 1 tablet by mouth daily.  90 tablet  3  . zolpidem (AMBIEN)  10 MG tablet Take 5-10 mg by mouth at bedtime as needed.        Marland Kitchen DISCONTD: Multiple Vitamins-Minerals (ICAPS PO) Take 2 tablets by mouth daily.           Past Medical History  Diagnosis Date  . Carotid artery stenosis   . Gait disturbance   . Anxiety   . GERD (gastroesophageal reflux disease)   . Vitamin B12 deficiency   . Depression   . Osteoarthritis   . Diabetes mellitus, type 2   . Hypertension   . Vitamin D deficiency   . Tremor   . Aortic stenosis   . Hyperlipidemia     Past Surgical History  Procedure Date  . Vasectomy   . Lumbar laminectomy     L5 Dr Ophelia Charter  . Transurethral resection of prostate March 2007    History   Social History  . Marital Status: Married    Spouse Name: N/A    Number of Children: N/A  . Years of Education: N/A   Occupational History  . Not on file.   Social History Main Topics  . Smoking status: Former Smoker    Types: Cigars  . Smokeless tobacco: Not on file  . Alcohol Use: No  . Drug Use: Not on file  . Sexually Active: Not on file   Other Topics Concern  . 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 Remaining systems are negative.  Physical Exam: Well-developed frail in no acute distress.  Skin is warm and dry.  HEENT is normal.  Neck is supple. No thyromegaly.  Chest is clear to auscultation with normal expansion.  Cardiovascular exam is bradycardic Abdominal exam nontender or distended. No masses palpated. Extremities show no edema. neuro grossly intact  ECG October 15, 2010-Marked sinus bradycardia at a rate of 47. Nonspecific ST changes.

## 2010-11-20 NOTE — Assessment & Plan Note (Signed)
Blood pressure controlled. Continue present medications. 

## 2010-11-20 NOTE — Assessment & Plan Note (Signed)
Continue aspirin and statin. Schedule followup carotid Dopplers. 

## 2010-11-20 NOTE — Assessment & Plan Note (Signed)
Change atenolol to 25 mg p.o. B.i.d.

## 2010-11-21 ENCOUNTER — Ambulatory Visit (HOSPITAL_COMMUNITY): Payer: MEDICARE | Attending: Cardiology | Admitting: Radiology

## 2010-11-21 ENCOUNTER — Encounter (INDEPENDENT_AMBULATORY_CARE_PROVIDER_SITE_OTHER): Payer: Medicare Other | Admitting: Cardiology

## 2010-11-21 DIAGNOSIS — I10 Essential (primary) hypertension: Secondary | ICD-10-CM | POA: Insufficient documentation

## 2010-11-21 DIAGNOSIS — I350 Nonrheumatic aortic (valve) stenosis: Secondary | ICD-10-CM

## 2010-11-21 DIAGNOSIS — I079 Rheumatic tricuspid valve disease, unspecified: Secondary | ICD-10-CM | POA: Insufficient documentation

## 2010-11-21 DIAGNOSIS — I6529 Occlusion and stenosis of unspecified carotid artery: Secondary | ICD-10-CM

## 2010-11-21 DIAGNOSIS — I080 Rheumatic disorders of both mitral and aortic valves: Secondary | ICD-10-CM | POA: Insufficient documentation

## 2010-11-21 DIAGNOSIS — E119 Type 2 diabetes mellitus without complications: Secondary | ICD-10-CM | POA: Insufficient documentation

## 2010-11-21 DIAGNOSIS — I359 Nonrheumatic aortic valve disorder, unspecified: Secondary | ICD-10-CM

## 2010-11-25 ENCOUNTER — Encounter: Payer: Medicare Other | Admitting: *Deleted

## 2010-11-26 ENCOUNTER — Encounter: Payer: Self-pay | Admitting: Cardiology

## 2011-01-19 ENCOUNTER — Other Ambulatory Visit (INDEPENDENT_AMBULATORY_CARE_PROVIDER_SITE_OTHER): Payer: Medicare Other

## 2011-01-19 DIAGNOSIS — E538 Deficiency of other specified B group vitamins: Secondary | ICD-10-CM

## 2011-01-19 DIAGNOSIS — E119 Type 2 diabetes mellitus without complications: Secondary | ICD-10-CM

## 2011-01-19 LAB — COMPREHENSIVE METABOLIC PANEL
AST: 18 U/L (ref 0–37)
BUN: 17 mg/dL (ref 6–23)
Calcium: 9.2 mg/dL (ref 8.4–10.5)
Chloride: 105 mEq/L (ref 96–112)
Creatinine, Ser: 0.9 mg/dL (ref 0.4–1.5)
GFR: 82.22 mL/min (ref >60.00)
Total Bilirubin: 0.7 mg/dL (ref 0.3–1.2)

## 2011-01-19 LAB — SEDIMENTATION RATE: Sed Rate: 14 mm/hr (ref 0–22)

## 2011-01-21 ENCOUNTER — Encounter: Payer: Self-pay | Admitting: Internal Medicine

## 2011-01-21 ENCOUNTER — Ambulatory Visit (INDEPENDENT_AMBULATORY_CARE_PROVIDER_SITE_OTHER): Payer: Medicare Other | Admitting: Internal Medicine

## 2011-01-21 VITALS — BP 130/76 | HR 76 | Temp 99.9°F | Resp 16 | Wt 148.0 lb

## 2011-01-21 DIAGNOSIS — F329 Major depressive disorder, single episode, unspecified: Secondary | ICD-10-CM

## 2011-01-21 DIAGNOSIS — E538 Deficiency of other specified B group vitamins: Secondary | ICD-10-CM

## 2011-01-21 DIAGNOSIS — R413 Other amnesia: Secondary | ICD-10-CM

## 2011-01-21 DIAGNOSIS — F411 Generalized anxiety disorder: Secondary | ICD-10-CM

## 2011-01-21 DIAGNOSIS — Z2911 Encounter for prophylactic immunotherapy for respiratory syncytial virus (RSV): Secondary | ICD-10-CM

## 2011-01-21 DIAGNOSIS — Z23 Encounter for immunization: Secondary | ICD-10-CM

## 2011-01-21 DIAGNOSIS — M545 Low back pain: Secondary | ICD-10-CM

## 2011-01-21 DIAGNOSIS — E119 Type 2 diabetes mellitus without complications: Secondary | ICD-10-CM

## 2011-01-21 DIAGNOSIS — R259 Unspecified abnormal involuntary movements: Secondary | ICD-10-CM

## 2011-01-21 LAB — GLUCOSE, POCT (MANUAL RESULT ENTRY): POC Glucose: 113

## 2011-01-21 MED ORDER — ACCU-CHEK MULTICLIX LANCETS MISC: Status: DC

## 2011-01-21 MED ORDER — FENTANYL 25 MCG/HR TD PT72: 1.0000 | MEDICATED_PATCH | TRANSDERMAL | Status: DC

## 2011-01-21 MED ORDER — AMLODIPINE BESYLATE 10 MG PO TABS: 10.0000 mg | ORAL_TABLET | Freq: Every day | ORAL | Status: DC

## 2011-01-21 MED ORDER — CARBIDOPA-LEVODOPA 10-100 MG PO TABS: 1.0000 | ORAL_TABLET | Freq: Three times a day (TID) | ORAL | Status: DC

## 2011-01-21 MED ORDER — CYANOCOBALAMIN 1000 MCG/ML IJ SOLN
1000.0000 ug | Freq: Once | INTRAMUSCULAR | Status: AC
  Administered 2011-01-21: 1000 ug via INTRAMUSCULAR

## 2011-01-21 MED ORDER — ZOLPIDEM TARTRATE 10 MG PO TABS: 5.0000 mg | ORAL_TABLET | Freq: Every evening | ORAL | Status: DC | PRN

## 2011-01-21 NOTE — Assessment & Plan Note (Signed)
On Rx 

## 2011-01-21 NOTE — Assessment & Plan Note (Signed)
Will try Sinemet bid

## 2011-01-21 NOTE — Progress Notes (Signed)
  Subjective:    Patient ID: [name], male    DOB: [birth year], 77 y.o.   MRN: [id number]  HPI  The patient presents for a follow-up of  chronic hypertension, chronic dyslipidemia, type 2 diabetes controlled with medicines C/o LBP, stiffness, weakness.    Review of Systems  Constitutional: Negative for fever, appetite change, fatigue and unexpected weight change.  HENT: Negative for nosebleeds, congestion, sore throat, sneezing, trouble swallowing and neck pain.   Eyes: Negative for itching and visual disturbance.  Respiratory: Negative for cough.   Cardiovascular: Negative for chest pain, palpitations and leg swelling.  Gastrointestinal: Negative for nausea, diarrhea, blood in stool and abdominal distention.  Genitourinary: Negative for frequency and hematuria.  Musculoskeletal: Positive for myalgias, back pain, arthralgias and gait problem. Negative for joint swelling.  Skin: Negative for rash.  Neurological: Positive for dizziness. Negative for tremors, syncope, speech difficulty and weakness.  Psychiatric/Behavioral: Positive for dysphoric mood. Negative for suicidal ideas, behavioral problems, sleep disturbance and agitation. The patient is nervous/anxious.        Objective:   Physical Exam  Constitutional: He is oriented to person, place, and time. He appears well-developed.  HENT:  Mouth/Throat: Oropharynx is clear and moist.  Eyes: Conjunctivae are normal. Pupils are equal, round, and reactive to light.  Neck: Normal range of motion. No JVD present. No thyromegaly present.  Cardiovascular: Normal rate, regular rhythm, normal heart sounds and intact distal pulses.  Exam reveals no gallop and no friction rub.   No murmur heard. Pulmonary/Chest: Effort normal and breath sounds normal. No respiratory distress. He has no wheezes. He has no rales. He exhibits no tenderness.  Abdominal: Soft. Bowel sounds are normal. He exhibits no distension and no mass. There is no  tenderness. There is no rebound and no guarding.  Musculoskeletal: Normal range of motion. He exhibits tenderness (LBP). He exhibits no edema.  Lymphadenopathy:    He has no cervical adenopathy.  Neurological: He is alert and oriented to person, place, and time. He has normal reflexes. He displays tremor (mild). No cranial nerve deficit or sensory deficit. He exhibits normal muscle tone. Coordination normal.       Using a walker Mask like facial expression Subdued  Skin: Skin is warm and dry. No rash (Subdude) noted.  Psychiatric: His behavior is normal. Judgment and thought content normal.       subdude          Assessment & Plan:    A complex case

## 2011-03-23 ENCOUNTER — Ambulatory Visit (INDEPENDENT_AMBULATORY_CARE_PROVIDER_SITE_OTHER): Payer: Medicare Other | Admitting: *Deleted

## 2011-03-23 DIAGNOSIS — Z23 Encounter for immunization: Secondary | ICD-10-CM

## 2011-03-23 LAB — DIFFERENTIAL
Basophils Absolute: 0
Basophils Relative: 0
Lymphocytes Relative: 29
Monocytes Absolute: 0.9
Neutro Abs: 6.1
Neutrophils Relative %: 61

## 2011-03-23 LAB — COMPREHENSIVE METABOLIC PANEL
Albumin: 3.4 — ABNORMAL LOW
BUN: 11
Chloride: 97
Creatinine, Ser: 0.89
Glucose, Bld: 88
Total Bilirubin: 1
Total Protein: 6.4

## 2011-03-23 LAB — CARDIAC PANEL(CRET KIN+CKTOT+MB+TROPI)
CK, MB: 1.4
Relative Index: INVALID
Total CK: 64
Troponin I: 0.01
Troponin I: 0.02

## 2011-03-23 LAB — CBC
HCT: 41.1
Hemoglobin: 13.8
MCV: 98.8
Platelets: 218
RDW: 13.7

## 2011-03-23 LAB — BASIC METABOLIC PANEL
BUN: 11
Chloride: 110
GFR calc non Af Amer: >60
Potassium: 3.5
Sodium: 145

## 2011-03-23 LAB — VITAMIN B12: Vitamin B-12: >2000 — ABNORMAL HIGH (ref 211–911)

## 2011-03-23 LAB — SEDIMENTATION RATE: Sed Rate: 8

## 2011-03-25 ENCOUNTER — Ambulatory Visit: Payer: Medicare Other

## 2011-04-07 ENCOUNTER — Telehealth: Payer: Self-pay | Admitting: *Deleted

## 2011-04-07 ENCOUNTER — Other Ambulatory Visit: Payer: Self-pay | Admitting: *Deleted

## 2011-04-07 MED ORDER — AMLODIPINE BESYLATE 10 MG PO TABS: 10.0000 mg | ORAL_TABLET | Freq: Every day | ORAL | Status: DC

## 2011-04-07 MED ORDER — ZOLPIDEM TARTRATE 10 MG PO TABS: 5.0000 mg | ORAL_TABLET | Freq: Every evening | ORAL | Status: DC | PRN

## 2011-04-07 NOTE — Telephone Encounter (Signed)
OK to fill this prescription with additional refills x5 Thank you!  

## 2011-04-07 NOTE — Telephone Encounter (Signed)
rf req for Clonazepam 0.5mg  1 po bid to [address]. Ok to Rf?

## 2011-04-08 MED ORDER — CLONAZEPAM 0.5 MG PO TABS: 0.5000 mg | ORAL_TABLET | Freq: Two times a day (BID) | ORAL | Status: DC | PRN

## 2011-04-08 NOTE — Telephone Encounter (Signed)
rf phoned in/ pt's wife informed.

## 2011-04-15 LAB — CREATININE, SERUM: Creatinine, Ser: 0.98

## 2011-04-21 ENCOUNTER — Ambulatory Visit: Payer: Medicare Other | Admitting: Internal Medicine

## 2011-06-02 ENCOUNTER — Ambulatory Visit: Payer: Medicare Other | Admitting: Internal Medicine

## 2011-06-03 ENCOUNTER — Ambulatory Visit (INDEPENDENT_AMBULATORY_CARE_PROVIDER_SITE_OTHER): Payer: Medicare Other | Admitting: Internal Medicine

## 2011-06-03 ENCOUNTER — Other Ambulatory Visit (INDEPENDENT_AMBULATORY_CARE_PROVIDER_SITE_OTHER): Payer: Medicare Other

## 2011-06-03 ENCOUNTER — Encounter: Payer: Self-pay | Admitting: Internal Medicine

## 2011-06-03 VITALS — BP 106/60 | HR 68 | Temp 98.3°F | Resp 16 | Wt 150.0 lb

## 2011-06-03 DIAGNOSIS — R259 Unspecified abnormal involuntary movements: Secondary | ICD-10-CM

## 2011-06-03 DIAGNOSIS — R29818 Other symptoms and signs involving the nervous system: Secondary | ICD-10-CM

## 2011-06-03 DIAGNOSIS — I498 Other specified cardiac arrhythmias: Secondary | ICD-10-CM

## 2011-06-03 DIAGNOSIS — M545 Low back pain, unspecified: Secondary | ICD-10-CM

## 2011-06-03 DIAGNOSIS — Z79899 Other long term (current) drug therapy: Secondary | ICD-10-CM

## 2011-06-03 DIAGNOSIS — E538 Deficiency of other specified B group vitamins: Secondary | ICD-10-CM

## 2011-06-03 DIAGNOSIS — F411 Generalized anxiety disorder: Secondary | ICD-10-CM

## 2011-06-03 DIAGNOSIS — E119 Type 2 diabetes mellitus without complications: Secondary | ICD-10-CM

## 2011-06-03 DIAGNOSIS — F329 Major depressive disorder, single episode, unspecified: Secondary | ICD-10-CM

## 2011-06-03 DIAGNOSIS — F3289 Other specified depressive episodes: Secondary | ICD-10-CM

## 2011-06-03 LAB — BASIC METABOLIC PANEL
BUN: 15 mg/dL (ref 6–23)
CO2: 30 mEq/L (ref 19–32)
Chloride: 103 mEq/L (ref 96–112)
GFR: 75.55 mL/min (ref >60.00)
Glucose, Bld: 71 mg/dL (ref 70–99)
Potassium: 3.7 mEq/L (ref 3.5–5.1)
Sodium: 140 mEq/L (ref 135–145)

## 2011-06-03 LAB — HEMOGLOBIN A1C: Hgb A1c MFr Bld: 5.7 % (ref 4.6–6.5)

## 2011-06-03 LAB — CK: Total CK: 87 U/L (ref 7–232)

## 2011-06-03 LAB — GLUCOSE, POCT (MANUAL RESULT ENTRY): POC Glucose: 125

## 2011-06-03 MED ORDER — CYANOCOBALAMIN 1000 MCG/ML IJ SOLN
1000.0000 ug | Freq: Once | INTRAMUSCULAR | Status: AC
  Administered 2011-06-03: 1000 ug via INTRAMUSCULAR

## 2011-06-03 MED ORDER — CLONAZEPAM 0.5 MG PO TABS: 0.5000 mg | ORAL_TABLET | Freq: Two times a day (BID) | ORAL | Status: DC | PRN

## 2011-06-03 MED ORDER — CYANOCOBALAMIN 1000 MCG/ML IJ SOLN: 1000.0000 ug | INTRAMUSCULAR | Status: DC

## 2011-06-03 MED ORDER — ZOLPIDEM TARTRATE 10 MG PO TABS: 5.0000 mg | ORAL_TABLET | Freq: Every evening | ORAL | Status: DC | PRN

## 2011-06-03 MED ORDER — CARBIDOPA-LEVODOPA 10-100 MG PO TABS: 1.0000 | ORAL_TABLET | Freq: Three times a day (TID) | ORAL | Status: DC

## 2011-06-03 MED ORDER — GLIPIZIDE ER 10 MG PO TB24: 10.0000 mg | ORAL_TABLET | Freq: Every day | ORAL | Status: DC

## 2011-06-03 MED ORDER — POTASSIUM CHLORIDE 10 MEQ PO TBCR: 10.0000 meq | EXTENDED_RELEASE_TABLET | Freq: Every day | ORAL | Status: DC

## 2011-06-03 MED ORDER — AMLODIPINE BESYLATE 5 MG PO TABS: 5.0000 mg | ORAL_TABLET | Freq: Every day | ORAL | Status: DC

## 2011-06-03 MED ORDER — AMLODIPINE BESYLATE 10 MG PO TABS: 10.0000 mg | ORAL_TABLET | Freq: Every day | ORAL | Status: DC

## 2011-06-03 MED ORDER — FENTANYL 25 MCG/HR TD PT72: 1.0000 | MEDICATED_PATCH | TRANSDERMAL | Status: DC

## 2011-06-03 MED ORDER — DICLOFENAC SODIUM 1 % TD GEL: 1.0000 "application " | Freq: Four times a day (QID) | TRANSDERMAL | Status: DC

## 2011-06-03 NOTE — Assessment & Plan Note (Signed)
Try Sinemet

## 2011-06-03 NOTE — Assessment & Plan Note (Signed)
Chronic  Potential benefits of a long term benzodiazepines  use as well as potential risks  and complications were explained to the patient and were aknowledged. 

## 2011-06-03 NOTE — Assessment & Plan Note (Signed)
Start back on Duragesic

## 2011-06-03 NOTE — Assessment & Plan Note (Signed)
Chronic, situational. He has some paranoid features Treat pain. Discussed

## 2011-06-03 NOTE — Progress Notes (Signed)
  Subjective:    Patient ID: [name], male    DOB: 23-Feb-1925, 77 y.o.   MRN: [id number]  HPI  The patient presents for a follow-up of  chronic hypertension, chronic dyslipidemia, type 2 diabetes better controlled, gait issues, LBP 8/10 at times, may be better with medicines - Lyrica prn; not using Sinemet, Duragesic    Review of Systems  Constitutional: Positive for fatigue. Negative for appetite change and unexpected weight change.  HENT: Negative for nosebleeds, congestion, sore throat, sneezing, trouble swallowing and neck pain.   Eyes: Negative for itching and visual disturbance.  Respiratory: Negative for cough.   Cardiovascular: Negative for chest pain, palpitations and leg swelling.  Gastrointestinal: Negative for nausea, diarrhea, blood in stool and abdominal distention.  Genitourinary: Negative for frequency and hematuria.  Musculoskeletal: Positive for back pain and gait problem. Negative for joint swelling.  Skin: Negative for rash.  Neurological: Positive for dizziness and weakness. Negative for tremors and speech difficulty.  Psychiatric/Behavioral: Positive for decreased concentration. Negative for suicidal ideas, sleep disturbance, dysphoric mood and agitation. The patient is nervous/anxious.        Objective:   Physical Exam  Constitutional: He is oriented to person, place, and time. He appears well-developed. No distress.  HENT:  Mouth/Throat: Oropharynx is clear and moist.  Eyes: Conjunctivae are normal. Pupils are equal, round, and reactive to light.  Neck: Normal range of motion. No JVD present. No thyromegaly present.  Cardiovascular: Normal rate, regular rhythm, normal heart sounds and intact distal pulses.  Exam reveals no gallop and no friction rub.   No murmur heard. Pulmonary/Chest: Effort normal and breath sounds normal. No respiratory distress. He has no wheezes. He has no rales. He exhibits no tenderness.  Abdominal: Soft. Bowel sounds are  normal. He exhibits no distension and no mass. There is no tenderness. There is no rebound and no guarding.  Musculoskeletal: Normal range of motion. He exhibits no edema and no tenderness.  Lymphadenopathy:    He has no cervical adenopathy.  Neurological: He is alert and oriented to person, place, and time. He displays abnormal reflex. No cranial nerve deficit. He exhibits abnormal muscle tone. Coordination abnormal.       W/c Arms w/cogwheel rigidity  Skin: Skin is warm and dry. No rash noted.  Psychiatric: His behavior is normal. Judgment and thought content normal.       depressed          Assessment & Plan:

## 2011-06-03 NOTE — Assessment & Plan Note (Signed)
Continue with current prescription therapy as reflected on the Med list.  

## 2011-06-04 ENCOUNTER — Telehealth: Payer: Self-pay | Admitting: Internal Medicine

## 2011-06-04 NOTE — Telephone Encounter (Signed)
Stacey, please, inform patient that all labs are normal   Please, mail the labs to the patient.    Thx  

## 2011-06-05 NOTE — Telephone Encounter (Signed)
Notified pt's wife and copy has been mailed to pt.

## 2011-08-07 ENCOUNTER — Telehealth: Payer: Self-pay | Admitting: *Deleted

## 2011-08-07 NOTE — Telephone Encounter (Signed)
Rec fax requesting to clarify the correct strength on amlodipine. 5 or 10 mg? Fax is on [name]'s desk to fax back to [employer].

## 2011-08-10 NOTE — Telephone Encounter (Signed)
Faxed signed/updated and faxed back.

## 2011-08-25 ENCOUNTER — Encounter: Payer: Self-pay | Admitting: Internal Medicine

## 2011-08-25 ENCOUNTER — Ambulatory Visit (INDEPENDENT_AMBULATORY_CARE_PROVIDER_SITE_OTHER): Payer: Medicare Other | Admitting: Internal Medicine

## 2011-08-25 VITALS — BP 140/72 | HR 80 | Temp 97.3°F | Resp 16 | Wt 145.0 lb

## 2011-08-25 DIAGNOSIS — E119 Type 2 diabetes mellitus without complications: Secondary | ICD-10-CM

## 2011-08-25 DIAGNOSIS — M545 Low back pain, unspecified: Secondary | ICD-10-CM

## 2011-08-25 DIAGNOSIS — F411 Generalized anxiety disorder: Secondary | ICD-10-CM

## 2011-08-25 DIAGNOSIS — F329 Major depressive disorder, single episode, unspecified: Secondary | ICD-10-CM

## 2011-08-25 DIAGNOSIS — I10 Essential (primary) hypertension: Secondary | ICD-10-CM

## 2011-08-25 DIAGNOSIS — E538 Deficiency of other specified B group vitamins: Secondary | ICD-10-CM

## 2011-08-25 DIAGNOSIS — F0390 Unspecified dementia without behavioral disturbance: Secondary | ICD-10-CM

## 2011-08-25 DIAGNOSIS — R413 Other amnesia: Secondary | ICD-10-CM

## 2011-08-25 DIAGNOSIS — F3289 Other specified depressive episodes: Secondary | ICD-10-CM

## 2011-08-25 LAB — GLUCOSE, POCT (MANUAL RESULT ENTRY): POC Glucose: 109

## 2011-08-25 MED ORDER — MIRTAZAPINE 15 MG PO TBDP: 15.0000 mg | ORAL_TABLET | Freq: Every day | ORAL | Status: DC

## 2011-08-25 MED ORDER — METFORMIN HCL 1000 MG PO TABS: 1000.0000 mg | ORAL_TABLET | Freq: Two times a day (BID) | ORAL | Status: DC

## 2011-08-25 MED ORDER — CYANOCOBALAMIN 1000 MCG/ML IJ SOLN
1000.0000 ug | Freq: Once | INTRAMUSCULAR | Status: AC
  Administered 2011-08-25: 1000 ug via INTRAMUSCULAR

## 2011-08-25 MED ORDER — CLONAZEPAM 0.5 MG PO TABS: 0.5000 mg | ORAL_TABLET | Freq: Two times a day (BID) | ORAL | Status: DC | PRN

## 2011-08-25 NOTE — Assessment & Plan Note (Signed)
Continue with current prescription therapy as reflected on the Med list.  

## 2011-08-25 NOTE — Assessment & Plan Note (Signed)
Chronic. 

## 2011-08-25 NOTE — Assessment & Plan Note (Signed)
Chronic, situational. He has some paranoid features. 2/13 more paranoid

## 2011-08-25 NOTE — Progress Notes (Signed)
Patient ID: [name], male   DOB: 07-08-1924, 78 y.o.   MRN: [id number]  Subjective:    Patient ID: [name], male    DOB: [birth year], 78 y.o.   MRN: [id number]  Back Pain Associated symptoms include leg pain and weakness. Pertinent negatives include no chest pain.  Leg Pain     The patient presents for a follow-up of  chronic hypertension, chronic dyslipidemia, type 2 diabetes better controlled, gait issues, LBP 8/10 at times, may be better with medicines - Lyrica prn; not using Sinemet, Duragesic  Paranoid about wife having an affair  Wt Readings from Last 3 Encounters:  08/25/11 145 lb (65.772 kg)  06/03/11 150 lb (68.04 kg)  01/21/11 148 lb (67.132 kg)   BP Readings from Last 3 Encounters:  08/25/11 140/72  06/03/11 106/60  01/21/11 130/76     Review of Systems  Constitutional: Positive for fatigue. Negative for appetite change and unexpected weight change.  HENT: Negative for nosebleeds, congestion, sore throat, sneezing, trouble swallowing and neck pain.   Eyes: Negative for itching and visual disturbance.  Respiratory: Negative for cough.   Cardiovascular: Negative for chest pain, palpitations and leg swelling.  Gastrointestinal: Negative for nausea, diarrhea, blood in stool and abdominal distention.  Genitourinary: Negative for frequency and hematuria.  Musculoskeletal: Positive for back pain and gait problem. Negative for joint swelling.  Skin: Negative for rash.  Neurological: Positive for dizziness and weakness. Negative for tremors and speech difficulty.  Psychiatric/Behavioral: Positive for decreased concentration. Negative for suicidal ideas, sleep disturbance, dysphoric mood and agitation. The patient is nervous/anxious.        Objective:   Physical Exam  Constitutional: He is oriented to person, place, and time. He appears well-developed. No distress.  HENT:  Mouth/Throat: Oropharynx is clear and moist.  Eyes: Conjunctivae are normal. Pupils  are equal, round, and reactive to light.  Neck: Normal range of motion. No JVD present. No thyromegaly present.  Cardiovascular: Normal rate, regular rhythm, normal heart sounds and intact distal pulses.  Exam reveals no gallop and no friction rub.   No murmur heard. Pulmonary/Chest: Effort normal and breath sounds normal. No respiratory distress. He has no wheezes. He has no rales. He exhibits no tenderness.  Abdominal: Soft. Bowel sounds are normal. He exhibits no distension and no mass. There is no tenderness. There is no rebound and no guarding.  Musculoskeletal: Normal range of motion. He exhibits no edema and no tenderness.  Lymphadenopathy:    He has no cervical adenopathy.  Neurological: He is alert and oriented to person, place, and time. He displays abnormal reflex. No cranial nerve deficit. He exhibits abnormal muscle tone. Coordination abnormal.       W/c Arms w/cogwheel rigidity  Skin: Skin is warm and dry. No rash noted.  Psychiatric: His behavior is normal. Judgment and thought content normal.       depressed   Scar on L chest - healing well Angry, hostile, paranoid  Lab Results  Component Value Date   WBC 8.3 10/13/2010   HGB 12.8* 10/13/2010   HCT 37.4* 10/13/2010   PLT 190.0 10/13/2010   GLUCOSE 71 06/03/2011   CHOL 98 10/13/2010   TRIG 112.0 10/13/2010   HDL 33.60* 10/13/2010   LDLDIRECT 141.6 07/11/2007   LDLCALC 42 10/13/2010   ALT 16 01/19/2011   AST 18 01/19/2011   NA 140 06/03/2011   K 3.7 06/03/2011   CL 103 06/03/2011   CREATININE 1.0 06/03/2011   BUN  15 06/03/2011   CO2 30 06/03/2011   TSH 1.25 06/03/2011   PSA 1.24 10/13/2010   INR 0.9 RATIO 07/27/2006   HGBA1C 5.7 06/03/2011          Assessment & Plan:

## 2011-08-25 NOTE — Assessment & Plan Note (Signed)
Alzheimer's disease 2/13 -paranoid

## 2011-09-05 ENCOUNTER — Encounter (HOSPITAL_COMMUNITY): Payer: Self-pay | Admitting: Emergency Medicine

## 2011-09-05 ENCOUNTER — Emergency Department (HOSPITAL_COMMUNITY)
Admission: EM | Admit: 2011-09-05 | Discharge: 2011-09-06 | Disposition: A | Discharge status: Home / Self Care | Payer: MEDICARE | Attending: Emergency Medicine | Admitting: Emergency Medicine

## 2011-09-05 DIAGNOSIS — N390 Urinary tract infection, site not specified: Secondary | ICD-10-CM

## 2011-09-05 DIAGNOSIS — F341 Dysthymic disorder: Secondary | ICD-10-CM | POA: Insufficient documentation

## 2011-09-05 DIAGNOSIS — Z7982 Long term (current) use of aspirin: Secondary | ICD-10-CM | POA: Insufficient documentation

## 2011-09-05 DIAGNOSIS — R51 Headache: Secondary | ICD-10-CM | POA: Insufficient documentation

## 2011-09-05 DIAGNOSIS — Z79899 Other long term (current) drug therapy: Secondary | ICD-10-CM | POA: Insufficient documentation

## 2011-09-05 DIAGNOSIS — E119 Type 2 diabetes mellitus without complications: Secondary | ICD-10-CM | POA: Insufficient documentation

## 2011-09-05 DIAGNOSIS — I10 Essential (primary) hypertension: Secondary | ICD-10-CM

## 2011-09-05 DIAGNOSIS — F29 Unspecified psychosis not due to a substance or known physiological condition: Secondary | ICD-10-CM | POA: Insufficient documentation

## 2011-09-05 DIAGNOSIS — K219 Gastro-esophageal reflux disease without esophagitis: Secondary | ICD-10-CM | POA: Insufficient documentation

## 2011-09-05 DIAGNOSIS — M1990 Unspecified osteoarthritis, unspecified site: Secondary | ICD-10-CM | POA: Insufficient documentation

## 2011-09-05 DIAGNOSIS — F419 Anxiety disorder, unspecified: Secondary | ICD-10-CM

## 2011-09-05 DIAGNOSIS — E785 Hyperlipidemia, unspecified: Secondary | ICD-10-CM | POA: Insufficient documentation

## 2011-09-05 NOTE — ED Notes (Signed)
Prior note written by R. Redmond Baseman, RN

## 2011-09-05 NOTE — ED Notes (Signed)
Pt c/o headache x 1 yr. Pt c/o hypertension at home 212/92 at highest. Pt was taking usual night meds at this time. Pt's wife states pt was anxious due to the elevated b/p. Pt presents w/ slight high b/p and no overt s/s of anxiety.

## 2011-09-06 ENCOUNTER — Emergency Department (HOSPITAL_COMMUNITY): Payer: MEDICARE

## 2011-09-06 ENCOUNTER — Other Ambulatory Visit: Payer: Self-pay

## 2011-09-06 LAB — URINE MICROSCOPIC-ADD ON

## 2011-09-06 LAB — URINALYSIS, ROUTINE W REFLEX MICROSCOPIC
Bilirubin Urine: NEGATIVE
Glucose, UA: NEGATIVE mg/dL
Hgb urine dipstick: NEGATIVE
Ketones, ur: NEGATIVE mg/dL
Nitrite: NEGATIVE
Protein, ur: >300 mg/dL — AB
Specific Gravity, Urine: 1.02 (ref 1.005–1.030)
Urobilinogen, UA: 0.2 mg/dL (ref 0.0–1.0)
pH: 6 (ref 5.0–8.0)

## 2011-09-06 LAB — POCT I-STAT, CHEM 8
BUN: 18 mg/dL (ref 6–23)
Calcium, Ion: 1.18 mmol/L (ref 1.12–1.32)
HCT: 34 % — ABNORMAL LOW (ref 39.0–52.0)
Hemoglobin: 11.6 g/dL — ABNORMAL LOW (ref 13.0–17.0)
TCO2: 26 mmol/L (ref 0–100)

## 2011-09-06 LAB — POCT I-STAT TROPONIN I: Troponin i, poc: 0.01 ng/mL (ref 0.00–0.08)

## 2011-09-06 MED ORDER — CIPROFLOXACIN HCL 500 MG PO TABS
500.0000 mg | ORAL_TABLET | Freq: Once | ORAL | Status: AC
  Administered 2011-09-06: 500 mg via ORAL
  Filled 2011-09-06: qty 1

## 2011-09-06 MED ORDER — CIPROFLOXACIN HCL 500 MG PO TABS: 500.0000 mg | ORAL_TABLET | Freq: Two times a day (BID) | ORAL | Status: AC

## 2011-09-06 NOTE — ED Notes (Signed)
Per Pt: "I just thought I might die in my sleep" Pt denies, chest pain, shortness of breath, new symptoms or concerns. Mentions 6lb weight loss in past two months. Pt is calm and cooperative. Reports anxiety, has flat affect, denies SI and HI, but reports numerous minor "aches and pains" regrets over aging.

## 2011-09-06 NOTE — Discharge Instructions (Signed)
  Please review the instructions below. Your EKG, cardiac enzymes, chemistry panel, and CT brain are all normal tonight. The urine test does show that you have a urinary tract infection. We are treating this infection with an antibiotic. Take as directed and be sure to complete. This is not likely the source of the symptoms you experience last night.  I do think your symptoms are at least partially related to your anxiety. You should reconsider taking the medication that your primary care physician recommended for anxiety. Return for worsening symptoms, otherwise call Dr. Posey Rea tomorrow morning to discuss your antibiotic and urinary tract infection as well as to schedule a follow up appointment for recheck of your urine.   Anxiety and Panic Attacks Anxiety is your body's way of reacting to real danger or something you think is a danger. It may be fear or worry over a situation like losing your job. Sometimes the cause is not known. A panic attack is made up of physical signs like sweating, shaking, or chest pain. Anxiety and panic attacks may start suddenly. They may be strong. They may come at any time of day, even while sleeping. They may come at any time of life. Panic attacks are scary, but they do not harm you physically.  HOME CARE  Avoid any known causes of your anxiety.   Try to relax. Yoga may help. Tell yourself everything will be okay.   Exercise often.   Get expert advice and help (therapy) to stop anxiety or attacks from happening.   Avoid caffeine, alcohol, and drugs.   Only take medicine as told by your doctor.  GET HELP RIGHT AWAY IF:  Your attacks seem different than normal attacks.   Your problems are getting worse or concern you.  MAKE SURE YOU:  Understand these instructions.   Will watch your condition.   Will get help right away if you are not doing well or get worse.  Document Released: 07/18/2010 Document Revised: 06/04/2011 Document Reviewed: 07/18/2010

## 2011-09-06 NOTE — ED Notes (Signed)
1st attempt to obtain labs pt in x-ray °

## 2011-09-06 NOTE — ED Provider Notes (Signed)
History     CSN: [id number]  Arrival date & time 09/05/11  2317   First MD Initiated Contact with Patient 09/06/11 0136      Chief Complaint  Patient presents with  . Hypertension  . Anxiety   HPI: Patient is a 78 y.o. male presenting with hypertension. The history is provided by the patient.  Hypertension This is a recurrent problem. The current episode started today. The problem occurs intermittently. The problem has been gradually improving. Associated symptoms include headaches. Pertinent negatives include no chest pain, chills, coughing, fever, myalgias, nausea, neck pain, numbness or rash. The symptoms are aggravated by nothing.  Hypertension This is a recurrent problem. The current episode started today. The problem occurs intermittently. The problem has been gradually improving. Associated symptoms include headaches. Pertinent negatives include no chest pain. The symptoms are aggravated by nothing.  Pt reports mild headache prior to retiring to bed. States his wife took his blood pressure and it was 212/92. Pt states he felt very worried and was unable to sleep. States he felt if he were to lie down he might not wake up. Denies CP, SOB or other specific symptoms. States h/a has resolved. States he did take his bedtime BP med as well as other medications prior to coming to hospital. Pt admits to struggling w/ depression for some time. States his PCP recently started him on an anti-depressant but he does not like to take it. Denies SI/HI.  Pt's wife reports to me that pt often has these episodes where he becomes very anxious and fears dying. She admits to some concern about stroke vs dementia as pt has had recent reports of h/a's and periods of confusion. States PCP has given pt meds for both anxiety and depression but pt refuses to take them.  Past Medical History  Diagnosis Date  . Carotid artery stenosis   . Gait disturbance   . Anxiety   . GERD (gastroesophageal reflux disease)     . Vitamin B12 deficiency   . Depression   . Osteoarthritis   . Diabetes mellitus, type 2   . Hypertension   . Vitamin d deficiency   . Tremor   . Aortic stenosis   . Hyperlipidemia     Past Surgical History  Procedure Date  . Vasectomy   . Lumbar laminectomy     L5 Dr Ophelia Charter  . Transurethral resection of prostate March 2007    Family History  Problem Relation Age of Onset  . Diabetes Father     History  Substance Use Topics  . Smoking status: Former Smoker    Types: Cigars  . Smokeless tobacco: Not on file  . Alcohol Use: No      Review of Systems  Constitutional: Negative.  Negative for fever and chills.  HENT: Negative for neck pain.   Respiratory: Negative.  Negative for cough.   Cardiovascular: Negative.  Negative for chest pain.  Gastrointestinal: Negative.  Negative for nausea.  Genitourinary: Negative.   Musculoskeletal: Negative.  Negative for myalgias.  Skin: Negative.  Negative for rash.  Neurological: Positive for headaches. Negative for numbness.  Hematological: Negative.     Allergies  Citalopram hydrobromide and Duloxetine  Home Medications   Current Outpatient Rx  Name Route Sig Dispense Refill  . AMLODIPINE BESYLATE 5 MG PO TABS Oral Take 1 tablet (5 mg total) by mouth daily. 90 tablet 3  . VITAMIN C PO Oral Take 1 tablet by mouth daily.      Marland Kitchen  ASPIRIN 81 MG PO TABS Oral Take 81 mg by mouth daily.      . ATENOLOL 25 MG PO TABS Oral Take 1 tablet (25 mg total) by mouth 2 (two) times daily. 60 tablet 11  . CHOLECALCIFEROL 1000 UNITS PO CAPS Oral Take 2,000 Units by mouth daily.      Marland Kitchen CLONAZEPAM 0.5 MG PO TABS Oral Take 1 tablet (0.5 mg total) by mouth 2 (two) times daily as needed. 60 tablet 5  . CYANOCOBALAMIN 1000 MCG/ML IJ SOLN Intramuscular Inject 1 mL (1,000 mcg total) into the muscle every 8 (eight) weeks. 30 mL 3  . DICLOFENAC SODIUM 75 MG PO TBEC Oral Take 75 mg by mouth 2 (two) times daily.      Marland Kitchen DICLOFENAC SODIUM 1 % TD GEL  Topical Apply 1 application topically 4 (four) times daily. Two to four times daily as needed 300 g 3  . FENTANYL 25 MCG/HR TD PT72 Transdermal Place 1 patch (25 mcg total) onto the skin every 3 (three) days. Fill on or after 06/03/11 10 patch 0  . OMEGA-3 FATTY ACIDS 1000 MG PO CAPS Oral Take 1 g by mouth every other day.      Marland Kitchen GLIPIZIDE ER 10 MG PO TB24 Oral Take 1 tablet (10 mg total) by mouth daily. 90 tablet 3  . METFORMIN HCL 1000 MG PO TABS Oral Take 500-1,000 mg by mouth See admin instructions. 1000 in the am and 500 mg at qpm    . MIRTAZAPINE 15 MG PO TBDP Oral Take 1 tablet (15 mg total) by mouth at bedtime. Take at 5-6 pm 30 tablet 5  . ICAPS PO CAPS  1 tab po qd     . POTASSIUM CHLORIDE 10 MEQ PO TBCR Oral Take 1 tablet (10 mEq total) by mouth daily. 90 tablet 3  . PREGABALIN 75 MG PO CAPS Oral Take 1 capsule (75 mg total) by mouth 2 (two) times daily. 180 capsule 3  . SIMVASTATIN 40 MG PO TABS Oral Take 1 tablet (40 mg total) by mouth at bedtime. 90 tablet 3  . VALSARTAN-HYDROCHLOROTHIAZIDE 320-12.5 MG PO TABS Oral Take 1 tablet by mouth daily. 90 tablet 3  . ZOLPIDEM TARTRATE 10 MG PO TABS Oral Take 0.5-1 tablets (5-10 mg total) by mouth at bedtime as needed. 90 tablet 2    BP 141/57  Pulse 45  Temp(Src) 98.2 F (36.8 C) (Oral)  Resp 13  SpO2 96%  Physical Exam  Constitutional: He is oriented to person, place, and time. He appears well-developed and well-nourished.  HENT:  Head: Normocephalic and atraumatic.  Eyes: Conjunctivae and EOM are normal. Pupils are equal, round, and reactive to light.  Neck: Neck supple.  Cardiovascular: Normal rate and regular rhythm.   Pulmonary/Chest: Effort normal and breath sounds normal.  Abdominal: Soft. Bowel sounds are normal.  Musculoskeletal: Normal range of motion.  Neurological: He is alert and oriented to person, place, and time. He has normal strength. No cranial nerve deficit.  Skin: Skin is warm and dry.  Psychiatric: His  speech is normal and behavior is normal. Thought content normal. His mood appears anxious. He expresses no homicidal and no suicidal ideation.    ED Course  Procedures  Date: 09/08/2011  Rate: 44  Rhythm: sinus bradycardia  QRS Axis: normal  Intervals: normal  ST/T Wave abnormalities: nonspecific ST changes and nonspecific T wave changes  Conduction Disutrbances:none  Narrative Interpretation: Unchanged form EKG's dating back to 08/2009  Old EKG Reviewed:  unchanged   Pt has declined medication for pain and anxiety. Will obtain Ct head, EKG, U/A, CXR and appropriate labs and re-evalaute.  Findings and clinical impression discussed w/ pt and spouse. Will plan for d/c home w/ tx for UTI and encourage close f/u w/ PCP for recheck. Pt and spouse agreeable w/ plan.  Labs Reviewed  POCT I-STAT, CHEM 8 - Abnormal; Notable for the following:    Hemoglobin 11.6 (*)    HCT 34.0 (*)    All other components within normal limits  POCT I-STAT TROPONIN I  URINALYSIS, ROUTINE W REFLEX MICROSCOPIC   Dg Chest 2 View  09/06/2011  *RADIOLOGY REPORT*  Clinical Data: Weakness  CHEST - 2 VIEW  Comparison: 08/21/2010  Findings: Cardiomegaly.  Aortic atherosclerotic calcification. Senescent changes without focal consolidation.  No pleural effusion or pneumothorax.  No acute osseous abnormality.  Osteopenia.  IMPRESSION: No focal consolidation.  Original Report Authenticated By: Waneta Martins, M.D.   Ct Head Wo Contrast  09/06/2011  *RADIOLOGY REPORT*  Clinical Data: Headache, hypertension  CT HEAD WITHOUT CONTRAST  Technique:  Contiguous axial images were obtained from the base of the skull through the vertex without contrast.  Comparison: 04/17/2009 MRI  Findings: Prominence of the sulci, cisterns, and ventricles, in keeping with volume loss. There are subcortical and periventricular white matter hypodensities, a nonspecific finding most often seen with chronic microangiopathic changes.  Left  pontomedullary hypoattenuation may reflect prior lacunar infarction. Atherosclerotic vascular calcification.  There is no evidence for acute hemorrhage, overt hydrocephalus, mass lesion, or abnormal extra-axial fluid collection.  No definite CT evidence for acute cortical based (large artery) infarction.  The visualized paranasal sinuses and mastoid air cells are predominately clear.  IMPRESSION: White matter hypodensities and age indeterminate lacunar infarction within the left pons. No hemorrhage or definite CT evidence for acute cortical based (large artery) infarction.   Original Report Authenticated By: Waneta Martins, M.D.     No diagnosis found.    MDM  HPI/PE and clinical findings/course c/w 1. HTN (Improved while in ED, pt currently on Norvasc, Atenolol and Diovan/HCTZ, EKG w/o acute findings, BUN/Creat  18/1.10) 2. UTI 3. Anxiety/depression (Denies SI/HI, recently placed on meds for both but pt reluctant to take)        Leanne Chang, NP 09/10/11 1217  Roma Kayser Schorr, NP 09/10/11 1218  Roma Kayser Schorr, NP 09/10/11 1251  Medical screening examination/treatment/procedure(s) were performed by non-physician practitioner and as supervising physician I was immediately available for consultation/collaboration.  Sunnie Nielsen, MD 09/14/11 [phone]

## 2011-09-06 NOTE — ED Notes (Signed)
Patient is alert and oriented x3.  He was given DC instructions and follow up visit instructions.  Patient gave verbal understanding.  He was DC via wheelchair  to home.  V/S stable.  He was not showing any signs of distress on DC 

## 2011-09-09 ENCOUNTER — Ambulatory Visit: Payer: Medicare Other | Admitting: Internal Medicine

## 2011-09-16 ENCOUNTER — Ambulatory Visit (INDEPENDENT_AMBULATORY_CARE_PROVIDER_SITE_OTHER): Payer: Medicare Other | Admitting: Internal Medicine

## 2011-09-16 ENCOUNTER — Other Ambulatory Visit (INDEPENDENT_AMBULATORY_CARE_PROVIDER_SITE_OTHER): Payer: Medicare Other

## 2011-09-16 ENCOUNTER — Encounter: Payer: Self-pay | Admitting: Internal Medicine

## 2011-09-16 ENCOUNTER — Other Ambulatory Visit: Payer: Self-pay | Admitting: *Deleted

## 2011-09-16 VITALS — BP 142/78 | HR 72 | Temp 97.8°F | Resp 16 | Wt 142.0 lb

## 2011-09-16 DIAGNOSIS — R300 Dysuria: Secondary | ICD-10-CM

## 2011-09-16 DIAGNOSIS — E119 Type 2 diabetes mellitus without complications: Secondary | ICD-10-CM

## 2011-09-16 DIAGNOSIS — F329 Major depressive disorder, single episode, unspecified: Secondary | ICD-10-CM

## 2011-09-16 DIAGNOSIS — F0390 Unspecified dementia without behavioral disturbance: Secondary | ICD-10-CM

## 2011-09-16 DIAGNOSIS — M545 Low back pain, unspecified: Secondary | ICD-10-CM

## 2011-09-16 DIAGNOSIS — E538 Deficiency of other specified B group vitamins: Secondary | ICD-10-CM

## 2011-09-16 DIAGNOSIS — F3289 Other specified depressive episodes: Secondary | ICD-10-CM

## 2011-09-16 DIAGNOSIS — I10 Essential (primary) hypertension: Secondary | ICD-10-CM

## 2011-09-16 LAB — URINALYSIS, ROUTINE W REFLEX MICROSCOPIC
Hgb urine dipstick: NEGATIVE
Nitrite: NEGATIVE
Specific Gravity, Urine: >=1.03 (ref 1.000–1.030)
Total Protein, Urine: 100

## 2011-09-16 MED ORDER — ZOLPIDEM TARTRATE 10 MG PO TABS: 5.0000 mg | ORAL_TABLET | Freq: Every evening | ORAL | Status: DC | PRN

## 2011-09-16 MED ORDER — MIRTAZAPINE 15 MG PO TBDP: 30.0000 mg | ORAL_TABLET | Freq: Every day | ORAL | Status: DC

## 2011-09-16 MED ORDER — CLONAZEPAM 0.5 MG PO TABS: 0.5000 mg | ORAL_TABLET | Freq: Two times a day (BID) | ORAL | Status: DC | PRN

## 2011-09-16 MED ORDER — CYANOCOBALAMIN 1000 MCG/ML IJ SOLN
1000.0000 ug | Freq: Once | INTRAMUSCULAR | Status: AC
  Administered 2011-09-16: 1000 ug via INTRAMUSCULAR

## 2011-09-16 NOTE — Assessment & Plan Note (Signed)
Continue with current prescription therapy as reflected on the Med list.  

## 2011-09-16 NOTE — Assessment & Plan Note (Signed)
Cont meds   

## 2011-09-16 NOTE — Progress Notes (Signed)
Patient ID: [name], male   DOB: 1924/11/04, 78 y.o.   MRN: [id number] Patient ID: [name], male   DOB: 04-11-1925, 78 y.o.   MRN: [id number]  Subjective:    Patient ID: [name], male    DOB: 1925-01-16, 78 y.o.   MRN: [id number]  Back Pain Associated symptoms include leg pain and weakness. Pertinent negatives include no chest pain.  Leg Pain     The patient presents for a follow-up of  chronic hypertension, chronic dyslipidemia, type 2 diabetes better controlled, gait issues, LBP 8/10 at times, may be better with medicines - Lyrica prn; not using Sinemet, Duragesic. He started Remeron 1 wk ago - a little calmer now  Paranoid about wife having an affair etc  Wt Readings from Last 3 Encounters:  09/16/11 142 lb (64.411 kg)  08/25/11 145 lb (65.772 kg)  06/03/11 150 lb (68.04 kg)   BP Readings from Last 3 Encounters:  09/16/11 142/78  09/06/11 141/57  08/25/11 140/72     Review of Systems  Constitutional: Positive for fatigue. Negative for appetite change and unexpected weight change.  HENT: Negative for nosebleeds, congestion, sore throat, sneezing, trouble swallowing and neck pain.   Eyes: Negative for itching and visual disturbance.  Respiratory: Negative for cough.   Cardiovascular: Negative for chest pain, palpitations and leg swelling.  Gastrointestinal: Negative for nausea, diarrhea, blood in stool and abdominal distention.  Genitourinary: Negative for frequency and hematuria.  Musculoskeletal: Positive for back pain and gait problem. Negative for joint swelling.  Skin: Negative for rash.  Neurological: Positive for dizziness and weakness. Negative for tremors and speech difficulty.  Psychiatric/Behavioral: Positive for decreased concentration. Negative for suicidal ideas, sleep disturbance, dysphoric mood and agitation. The patient is nervous/anxious.        Objective:   Physical Exam  Constitutional: He is oriented to person, place, and time.  He appears well-developed. No distress.  HENT:  Mouth/Throat: Oropharynx is clear and moist.  Eyes: Conjunctivae are normal. Pupils are equal, round, and reactive to light.  Neck: Normal range of motion. No JVD present. No thyromegaly present.  Cardiovascular: Normal rate, regular rhythm, normal heart sounds and intact distal pulses.  Exam reveals no gallop and no friction rub.   No murmur heard. Pulmonary/Chest: Effort normal and breath sounds normal. No respiratory distress. He has no wheezes. He has no rales. He exhibits no tenderness.  Abdominal: Soft. Bowel sounds are normal. He exhibits no distension and no mass. There is no tenderness. There is no rebound and no guarding.  Musculoskeletal: Normal range of motion. He exhibits no edema and no tenderness.  Lymphadenopathy:    He has no cervical adenopathy.  Neurological: He is alert and oriented to person, place, and time. He displays abnormal reflex. No cranial nerve deficit. He exhibits abnormal muscle tone. Coordination abnormal.       W/c Arms w/cogwheel rigidity  Skin: Skin is warm and dry. No rash noted.  Psychiatric: His behavior is normal. Judgment and thought content normal.       depressed   Scar on L chest - healing well Not angry, hostile, paranoid  Lab Results  Component Value Date   WBC 8.3 10/13/2010   HGB 11.6* 09/06/2011   HCT 34.0* 09/06/2011   PLT 190.0 10/13/2010   GLUCOSE 79 09/06/2011   CHOL 98 10/13/2010   TRIG 112.0 10/13/2010   HDL 33.60* 10/13/2010   LDLDIRECT 141.6 07/11/2007   LDLCALC 42 10/13/2010   ALT 16  01/19/2011   AST 18 01/19/2011   NA 143 09/06/2011   K 3.6 09/06/2011   CL 107 09/06/2011   CREATININE 1.10 09/06/2011   BUN 18 09/06/2011   CO2 30 06/03/2011   TSH 1.25 06/03/2011   PSA 1.24 10/13/2010   INR 0.9 RATIO 07/27/2006   HGBA1C 5.7 06/03/2011          Assessment & Plan:

## 2011-09-16 NOTE — Assessment & Plan Note (Signed)
Chronic. 

## 2011-09-16 NOTE — Assessment & Plan Note (Signed)
A little better w/Remeron

## 2011-10-01 ENCOUNTER — Encounter: Payer: Self-pay | Admitting: Cardiology

## 2011-10-03 ENCOUNTER — Encounter: Payer: Self-pay | Admitting: Cardiology

## 2011-10-12 ENCOUNTER — Emergency Department (HOSPITAL_COMMUNITY)
Admission: EM | Admit: 2011-10-12 | Discharge: 2011-10-12 | Disposition: A | Discharge status: Home / Self Care | Payer: MEDICARE

## 2011-10-12 ENCOUNTER — Encounter (HOSPITAL_COMMUNITY): Payer: Self-pay | Admitting: *Deleted
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
## 2011-10-12 NOTE — ED Notes (Signed)
Pt wife, and family is at bedside.  Courtney Registration came out of the room stating pt is becoming agitated and violent towards her because she was trying to put his armband on him.  Went in to check, pt states that he is not going to be admitted.  Pt made aware that the armband is just for identification and that it does not mean that he is being admitted to the hospital.  Pt became agitated and told his son to take him home.  Pt wheeled out of the room by his son.  Pt's wife and a male family member remained in the room with this nurse.  Wife states that she went to see pt's PMD Plotnikov today and was instructed to take pt to the ED and to be admitted.  Made her aware that the ED cannot hold pt against his will.  Wife became upset and started to cry.

## 2011-10-13 ENCOUNTER — Ambulatory Visit (INDEPENDENT_AMBULATORY_CARE_PROVIDER_SITE_OTHER): Payer: MEDICARE | Admitting: Internal Medicine

## 2011-10-13 ENCOUNTER — Encounter: Payer: Self-pay | Admitting: Internal Medicine

## 2011-10-13 VITALS — BP 130/60 | HR 84 | Temp 98.8°F | Resp 16 | Wt 140.0 lb

## 2011-10-13 DIAGNOSIS — F329 Major depressive disorder, single episode, unspecified: Secondary | ICD-10-CM

## 2011-10-13 DIAGNOSIS — F411 Generalized anxiety disorder: Secondary | ICD-10-CM

## 2011-10-13 DIAGNOSIS — R269 Unspecified abnormalities of gait and mobility: Secondary | ICD-10-CM

## 2011-10-13 DIAGNOSIS — E538 Deficiency of other specified B group vitamins: Secondary | ICD-10-CM

## 2011-10-13 DIAGNOSIS — R259 Unspecified abnormal involuntary movements: Secondary | ICD-10-CM

## 2011-10-13 DIAGNOSIS — F0390 Unspecified dementia without behavioral disturbance: Secondary | ICD-10-CM

## 2011-10-13 DIAGNOSIS — E119 Type 2 diabetes mellitus without complications: Secondary | ICD-10-CM

## 2011-10-13 DIAGNOSIS — K219 Gastro-esophageal reflux disease without esophagitis: Secondary | ICD-10-CM

## 2011-10-13 MED ORDER — OLANZAPINE 2.5 MG PO TABS: 2.5000 mg | ORAL_TABLET | Freq: Every day | ORAL | Status: DC

## 2011-10-13 MED ORDER — CYANOCOBALAMIN 1000 MCG/ML IJ SOLN
1000.0000 ug | Freq: Once | INTRAMUSCULAR | Status: AC
  Administered 2011-10-13: 1000 ug via INTRAMUSCULAR

## 2011-10-13 NOTE — Progress Notes (Signed)
Patient ID: [name], male   DOB: May 06, 1925, 78 y.o.   MRN: [id number] Patient ID: [name], male   DOB: Feb 20, 1925, 78 y.o.   MRN: [id number] Patient ID: [name], male   DOB: [birth year], 78 y.o.   MRN: [id number]  Subjective:    Patient ID: [name], male    DOB: December 09, 1924, 78 y.o.   MRN: [id number]  Headache  Associated symptoms include back pain, dizziness and weakness. Pertinent negatives include no coughing, nausea, neck pain or sore throat.  Back Pain Associated symptoms include headaches, leg pain and weakness. Pertinent negatives include no chest pain.  Leg Pain   "I don't trust people; many people lie to me" Am I nutts or not? I want my wife to take a lie detector test...  He went to ER twice in [place]...  The patient presents for a follow-up of  chronic hypertension, chronic dyslipidemia, type 2 diabetes better controlled, gait issues, LBP 8/10 at times, may be better with medicines - Lyrica prn; not using Sinemet, Duragesic. He started Remeron  - a little calmer now  Paranoid about wife having an affair etc  Wt Readings from Last 3 Encounters:  10/13/11 140 lb (63.504 kg)  09/16/11 142 lb (64.411 kg)  08/25/11 145 lb (65.772 kg)   BP Readings from Last 3 Encounters:  10/13/11 130/60  10/12/11 181/70  09/16/11 142/78     Review of Systems  Constitutional: Positive for fatigue. Negative for appetite change and unexpected weight change.  HENT: Negative for nosebleeds, congestion, sore throat, sneezing, trouble swallowing and neck pain.   Eyes: Negative for itching and visual disturbance.  Respiratory: Negative for cough.   Cardiovascular: Negative for chest pain, palpitations and leg swelling.  Gastrointestinal: Negative for nausea, diarrhea, blood in stool and abdominal distention.  Genitourinary: Negative for frequency and hematuria.  Musculoskeletal: Positive for back pain and gait problem. Negative for joint swelling.  Skin:  Negative for rash.  Neurological: Positive for dizziness, weakness and headaches. Negative for tremors and speech difficulty.  Psychiatric/Behavioral: Positive for decreased concentration. Negative for suicidal ideas, sleep disturbance, dysphoric mood and agitation. The patient is nervous/anxious.        Objective:   Physical Exam  Constitutional: He is oriented to person, place, and time. He appears well-developed. No distress.  HENT:  Mouth/Throat: Oropharynx is clear and moist.  Eyes: Conjunctivae are normal. Pupils are equal, round, and reactive to light.  Neck: Normal range of motion. No JVD present. No thyromegaly present.  Cardiovascular: Normal rate, regular rhythm, normal heart sounds and intact distal pulses.  Exam reveals no gallop and no friction rub.   No murmur heard. Pulmonary/Chest: Effort normal and breath sounds normal. No respiratory distress. He has no wheezes. He has no rales. He exhibits no tenderness.  Abdominal: Soft. Bowel sounds are normal. He exhibits no distension and no mass. There is no tenderness. There is no rebound and no guarding.  Musculoskeletal: Normal range of motion. He exhibits no edema and no tenderness.  Lymphadenopathy:    He has no cervical adenopathy.  Neurological: He is alert and oriented to person, place, and time. He displays abnormal reflex. No cranial nerve deficit. He exhibits abnormal muscle tone. Coordination abnormal.       W/c Arms w/cogwheel rigidity  Skin: Skin is warm and dry. No rash noted.  Psychiatric: His behavior is normal. Judgment and thought content normal.       depressed   Scar on L  chest - healing well Not angry; he is hostile, paranoid  He is afraid of being poisoned Has his pockets full of printed emails   Lab Results  Component Value Date   WBC 8.3 10/13/2010   HGB 11.6* 09/06/2011   HCT 34.0* 09/06/2011   PLT 190.0 10/13/2010   GLUCOSE 79 09/06/2011   CHOL 98 10/13/2010   TRIG 112.0 10/13/2010   HDL 33.60*  10/13/2010   LDLDIRECT 141.6 07/11/2007   LDLCALC 42 10/13/2010   ALT 16 01/19/2011   AST 18 01/19/2011   NA 143 09/06/2011   K 3.6 09/06/2011   CL 107 09/06/2011   CREATININE 1.10 09/06/2011   BUN 18 09/06/2011   CO2 30 06/03/2011   TSH 1.25 06/03/2011   PSA 1.24 10/13/2010   INR 0.9 RATIO 07/27/2006   HGBA1C 5.7 06/03/2011          Assessment & Plan:

## 2011-10-13 NOTE — Assessment & Plan Note (Signed)
Continue with current prescription therapy as reflected on the Med list. Will add Zyprexa - risks discussed

## 2011-10-13 NOTE — Assessment & Plan Note (Signed)
Continue with current prescription therapy as reflected on the Med list.  

## 2011-10-13 NOTE — Assessment & Plan Note (Signed)
He stopped sinemet

## 2011-10-13 NOTE — Assessment & Plan Note (Signed)
He is in a w/c now

## 2011-10-13 NOTE — Assessment & Plan Note (Addendum)
Alzheimer's disease 2/13 -paranoid Neurol appt offered Added Zyprexa at a low dose - risks discussed Neuro-psych full eval was offered

## 2011-10-13 NOTE — Assessment & Plan Note (Signed)
Chronic, situational. He has some paranoid features. 2/13 more paranoid Start Zyprexa  Potential benefits of a long term zyprexa  use as well as potential risks  and complications were explained to the patient and were aknowledged.

## 2011-10-14 ENCOUNTER — Telehealth: Payer: Self-pay | Admitting: Cardiology

## 2011-10-14 NOTE — Telephone Encounter (Signed)
Spoke with pt wife, will get the report and let the pt know if anything else is needed.

## 2011-10-14 NOTE — Telephone Encounter (Signed)
Walk In pt Form " pt Dropped Off papers for [name] 10/14/11/KM

## 2011-10-14 NOTE — Telephone Encounter (Signed)
Pt had CT done in [place] and wife will drop off a copy on Friday for provider to see and they would like to know since pt has had CT does he still need to have carotid done

## 2011-10-15 ENCOUNTER — Telehealth: Payer: Self-pay | Admitting: *Deleted

## 2011-10-15 NOTE — Telephone Encounter (Signed)
Unable to reach pt or leave a message, tp wife had brought some CT scanning the pt has had to make sure he still needed carotid dopplers. The CT scans are just of the head, from the base up. Therefore the pt will need to have carotid dopplers done.

## 2011-10-18 ENCOUNTER — Encounter: Payer: Self-pay | Admitting: Internal Medicine

## 2011-10-20 NOTE — Telephone Encounter (Signed)
Spoke with pt wife, aware the pt will need carotid dopplers. Dopplers scheduled.

## 2011-10-21 ENCOUNTER — Telehealth: Payer: Self-pay | Admitting: *Deleted

## 2011-10-21 NOTE — Telephone Encounter (Signed)
Noted Keep ROV Thx 

## 2011-10-21 NOTE — Telephone Encounter (Signed)
Rec fax back stating Olanzapine 2.5 mg is approved from 10/20/11-10/19/12. Pt's wife informed. She states the pt has his med but she is still having a lot of trouble with pt. He is hiding things from himself and later accusing her of taking them. She states someone a (MD) is going to have to inform him he has dementia.

## 2011-10-26 ENCOUNTER — Encounter (INDEPENDENT_AMBULATORY_CARE_PROVIDER_SITE_OTHER): Payer: MEDICARE

## 2011-10-26 DIAGNOSIS — I6529 Occlusion and stenosis of unspecified carotid artery: Secondary | ICD-10-CM

## 2011-10-27 ENCOUNTER — Ambulatory Visit: Payer: Medicare Other | Admitting: Internal Medicine

## 2011-11-04 ENCOUNTER — Ambulatory Visit (HOSPITAL_COMMUNITY): Payer: MEDICARE | Attending: Cardiovascular Disease

## 2011-11-04 ENCOUNTER — Other Ambulatory Visit: Payer: Self-pay

## 2011-11-04 ENCOUNTER — Telehealth: Payer: Self-pay | Admitting: Cardiology

## 2011-11-04 ENCOUNTER — Encounter: Payer: Self-pay | Admitting: Cardiology

## 2011-11-04 ENCOUNTER — Ambulatory Visit (INDEPENDENT_AMBULATORY_CARE_PROVIDER_SITE_OTHER): Payer: MEDICARE | Admitting: Cardiology

## 2011-11-04 VITALS — BP 118/64 | HR 80 | Wt 140.0 lb

## 2011-11-04 DIAGNOSIS — I059 Rheumatic mitral valve disease, unspecified: Secondary | ICD-10-CM | POA: Insufficient documentation

## 2011-11-04 DIAGNOSIS — E785 Hyperlipidemia, unspecified: Secondary | ICD-10-CM

## 2011-11-04 DIAGNOSIS — E7800 Pure hypercholesterolemia, unspecified: Secondary | ICD-10-CM

## 2011-11-04 DIAGNOSIS — I350 Nonrheumatic aortic (valve) stenosis: Secondary | ICD-10-CM

## 2011-11-04 DIAGNOSIS — I498 Other specified cardiac arrhythmias: Secondary | ICD-10-CM

## 2011-11-04 DIAGNOSIS — I359 Nonrheumatic aortic valve disorder, unspecified: Secondary | ICD-10-CM | POA: Insufficient documentation

## 2011-11-04 DIAGNOSIS — I10 Essential (primary) hypertension: Secondary | ICD-10-CM

## 2011-11-04 DIAGNOSIS — I6529 Occlusion and stenosis of unspecified carotid artery: Secondary | ICD-10-CM

## 2011-11-04 MED ORDER — ATENOLOL 25 MG PO TABS: 25.0000 mg | ORAL_TABLET | Freq: Every day | ORAL | Status: DC

## 2011-11-04 NOTE — Assessment & Plan Note (Signed)
Patient's heart rate runs in the high 40s low 50s at home. This may be contributing to fatigue. Decrease atenolol to 25 mg by mouth twice a day for 3 days and then 25 mg daily thereafter. We may need to discontinue this altogether.

## 2011-11-04 NOTE — Telephone Encounter (Signed)
[facility] pharmacy calling about dosage for atenolol please call back

## 2011-11-04 NOTE — Assessment & Plan Note (Signed)
Continue statin. Check lipids and liver. 

## 2011-11-04 NOTE — Patient Instructions (Addendum)
Your physician wants you to follow-up in: 6 MONTHS WITH DR Jens Som You will receive a reminder letter in the mail two months in advance. If you don't receive a letter, please call our office to schedule the follow-up appointment.   DECREASE ATENOLOL TO 25 MG TWICE DAILY X THREE DAYS THEN 25 MG ONCE DAILY  FASTING LAB WORK WHEN ABLE  Your physician has requested that you have an echocardiogram. Echocardiography is a painless test that uses sound waves to create images of your heart. It provides your doctor with information about the size and shape of your heart and how well your heart's chambers and valves are working. This procedure takes approximately one hour. There are no restrictions for this procedure.

## 2011-11-04 NOTE — Progress Notes (Signed)
HPI: Mr. [name] is a pleasant gentleman who I saw in February of 2010 for chest pain and hypertension. A Myoview was performed on August 16, 2008. Ejection fraction was 65% and there was no scar or ischemia. An echocardiogram in May of 2012 showed normal LV function. There was mild aortic stenosis with a mean gradient of 16 mm of mercury and trace aortic insufficiency. There was mild mitral regurgitation. There was biatrial enlargement. He had carotid Dopplers performed in April of 2013 and he had 40-59% bilateral internal carotid artery stenosis. Followup was recommended in one year. Since I last saw him in May 2012, he has dyspnea with his limited activities. No orthopnea, PND or pedal edema. No exertional chest pain or syncope. He does complain of weakness.   Current Outpatient Prescriptions  Medication Sig Dispense Refill  . amLODipine (NORVASC) 5 MG tablet Take 1 tablet (5 mg total) by mouth daily.  90 tablet  3  . Ascorbic Acid (VITAMIN C PO) Take 1 tablet by mouth daily.        Marland Kitchen aspirin 81 MG tablet Take 81 mg by mouth daily.        Marland Kitchen atenolol (TENORMIN) 50 MG tablet 1 tab am 1/2 tab pm      . Cholecalciferol (VITAMIN D3) 1000 UNIT capsule Take 2,000 Units by mouth daily.        . clonazePAM (KLONOPIN) 0.5 MG tablet Take 1 tablet (0.5 mg total) by mouth 2 (two) times daily as needed.  180 tablet  1  . cyanocobalamin (,VITAMIN B-12,) 1000 MCG/ML injection Inject 1 mL (1,000 mcg total) into the muscle every 8 (eight) weeks.  30 mL  3  . diclofenac (VOLTAREN) 75 MG EC tablet Take 75 mg by mouth 2 (two) times daily.        . diclofenac sodium (VOLTAREN) 1 % GEL Apply 1 application topically 4 (four) times daily. Two to four times daily as needed  300 g  3  . fentaNYL (DURAGESIC) 25 MCG/HR Place 1 patch (25 mcg total) onto the skin every 3 (three) days. Fill on or after 06/03/11  10 patch  0  . fish oil-omega-3 fatty acids 1000 MG capsule Take 1 g by mouth every other day.        Marland Kitchen glipiZIDE  (GLUCOTROL XL) 10 MG 24 hr tablet Take 1 tablet (10 mg total) by mouth daily.  90 tablet  3  . metFORMIN (GLUCOPHAGE) 1000 MG tablet Take 500-1,000 mg by mouth See admin instructions. 1000 in the am and 500 mg at qpm      . Multiple Vitamins-Minerals (ICAPS) CAPS 1 tab po qd       . OLANZapine (ZYPREXA) 2.5 MG tablet Take 1 tablet (2.5 mg total) by mouth at bedtime. Take at 5-6 pm  30 tablet  5  . potassium chloride (KLOR-CON) 10 MEQ CR tablet Take 1 tablet (10 mEq total) by mouth daily.  90 tablet  3  . pregabalin (LYRICA) 75 MG capsule Take 1 capsule (75 mg total) by mouth 2 (two) times daily.  180 capsule  3  . simvastatin (ZOCOR) 40 MG tablet Take 1 tablet (40 mg total) by mouth at bedtime.  90 tablet  3  . valsartan-hydrochlorothiazide (DIOVAN-HCT) 320-12.5 MG per tablet Take 1 tablet by mouth daily.  90 tablet  3  . zolpidem (AMBIEN) 10 MG tablet Take 0.5-1 tablets (5-10 mg total) by mouth at bedtime as needed for sleep.  90 tablet  1  .  mirtazapine (REMERON SOLTAB) 15 MG disintegrating tablet Take 2 tablets (30 mg total) by mouth at bedtime. Take at 5-6 pm  60 tablet  5     Past Medical History  Diagnosis Date  . Carotid artery stenosis   . Gait disturbance   . Anxiety   . GERD (gastroesophageal reflux disease)   . Vitamin B12 deficiency   . Depression   . Osteoarthritis   . Diabetes mellitus, type 2   . Hypertension   . Vitamin d deficiency   . Tremor   . Aortic stenosis   . Hyperlipidemia     Past Surgical History  Procedure Date  . Vasectomy   . Lumbar laminectomy     L5 Dr Ophelia Charter  . Transurethral resection of prostate March 2007    History   Social History  . Marital Status: Married    Spouse Name: N/A    Number of Children: N/A  . Years of Education: N/A   Occupational History  . Not on file.   Social History Main Topics  . Smoking status: Former Smoker    Types: Cigars  . Smokeless tobacco: Not on file  . Alcohol Use: No  . Drug Use: No  . Sexually  Active: Not Currently   Other Topics Concern  . 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 Remaining systems are negative.  Physical Exam: Well-developed chronically ill appearing in no acute distress.  Skin is warm and dry.  HEENT is normal.  Neck is supple.  Chest is clear to auscultation with normal expansion.  Cardiovascular exam is bradycardic 3/6 systolic murmur left sternal border. S2 is not diminished.;  Abdominal exam nontender or distended. No masses palpated. Extremities show trace edema. neuro grossly intact

## 2011-11-04 NOTE — Assessment & Plan Note (Signed)
Plan repeat echocardiogram. 

## 2011-11-04 NOTE — Assessment & Plan Note (Signed)
Decrease atenolol. If blood pressure increases may need to consider increasing Norvasc and changing Zocor to Pravachol.

## 2011-11-04 NOTE — Telephone Encounter (Signed)
Spoke with pharm, aware changes have been made in his script.

## 2011-11-04 NOTE — Assessment & Plan Note (Signed)
Continue aspirin and statin. Followup carotid Dopplers April 2014. 

## 2011-11-16 ENCOUNTER — Emergency Department (HOSPITAL_COMMUNITY)
Admission: EM | Admit: 2011-11-16 | Discharge: 2011-11-17 | Disposition: A | Discharge status: Home / Self Care | Payer: MEDICARE | Attending: Emergency Medicine | Admitting: Emergency Medicine

## 2011-11-16 ENCOUNTER — Encounter: Payer: Self-pay | Admitting: Internal Medicine

## 2011-11-16 ENCOUNTER — Ambulatory Visit (INDEPENDENT_AMBULATORY_CARE_PROVIDER_SITE_OTHER): Payer: MEDICARE | Admitting: Internal Medicine

## 2011-11-16 VITALS — BP 120/60 | HR 80 | Temp 98.7°F | Resp 16 | Wt 140.0 lb

## 2011-11-16 DIAGNOSIS — E785 Hyperlipidemia, unspecified: Secondary | ICD-10-CM | POA: Insufficient documentation

## 2011-11-16 DIAGNOSIS — I10 Essential (primary) hypertension: Secondary | ICD-10-CM

## 2011-11-16 DIAGNOSIS — R269 Unspecified abnormalities of gait and mobility: Secondary | ICD-10-CM

## 2011-11-16 DIAGNOSIS — Z7982 Long term (current) use of aspirin: Secondary | ICD-10-CM | POA: Insufficient documentation

## 2011-11-16 DIAGNOSIS — R413 Other amnesia: Secondary | ICD-10-CM

## 2011-11-16 DIAGNOSIS — E119 Type 2 diabetes mellitus without complications: Secondary | ICD-10-CM | POA: Insufficient documentation

## 2011-11-16 DIAGNOSIS — F341 Dysthymic disorder: Secondary | ICD-10-CM | POA: Insufficient documentation

## 2011-11-16 DIAGNOSIS — K219 Gastro-esophageal reflux disease without esophagitis: Secondary | ICD-10-CM | POA: Insufficient documentation

## 2011-11-16 DIAGNOSIS — Z79899 Other long term (current) drug therapy: Secondary | ICD-10-CM | POA: Insufficient documentation

## 2011-11-16 DIAGNOSIS — F22 Delusional disorders: Secondary | ICD-10-CM | POA: Insufficient documentation

## 2011-11-16 DIAGNOSIS — F0390 Unspecified dementia without behavioral disturbance: Secondary | ICD-10-CM

## 2011-11-16 DIAGNOSIS — E538 Deficiency of other specified B group vitamins: Secondary | ICD-10-CM

## 2011-11-16 DIAGNOSIS — M1990 Unspecified osteoarthritis, unspecified site: Secondary | ICD-10-CM | POA: Insufficient documentation

## 2011-11-16 DIAGNOSIS — F603 Borderline personality disorder: Secondary | ICD-10-CM | POA: Insufficient documentation

## 2011-11-16 LAB — GLUCOSE, POCT (MANUAL RESULT ENTRY): POC Glucose: 111 mg/dl — AB (ref 70–99)

## 2011-11-16 MED ORDER — VALSARTAN-HYDROCHLOROTHIAZIDE 320-12.5 MG PO TABS: 1.0000 | ORAL_TABLET | Freq: Every day | ORAL | Status: DC

## 2011-11-16 MED ORDER — SIMVASTATIN 40 MG PO TABS: 40.0000 mg | ORAL_TABLET | Freq: Every day | ORAL | Status: DC

## 2011-11-16 MED ORDER — CYANOCOBALAMIN 1000 MCG/ML IJ SOLN
1000.0000 ug | Freq: Once | INTRAMUSCULAR | Status: AC
  Administered 2011-11-16: 1000 ug via INTRAMUSCULAR

## 2011-11-16 MED ORDER — OLANZAPINE 2.5 MG PO TABS: 2.5000 mg | ORAL_TABLET | Freq: Every day | ORAL | Status: DC

## 2011-11-16 MED ORDER — MIRTAZAPINE 15 MG PO TBDP: 15.0000 mg | ORAL_TABLET | Freq: Every day | ORAL | Status: DC
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
## 2011-11-16 NOTE — Assessment & Plan Note (Signed)
Start Zyprexa

## 2011-11-16 NOTE — Assessment & Plan Note (Signed)
Continue with current prescription therapy as reflected on the Med list.  

## 2011-11-16 NOTE — Assessment & Plan Note (Signed)
He had a shot today

## 2011-11-16 NOTE — Assessment & Plan Note (Signed)
[name]
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
## 2011-11-16 NOTE — ED Notes (Signed)
[id number]> Expected date:<BR> Expected time:<BR> Means of arrival:<BR> Comments:<BR> EMS/elderly IVC

## 2011-11-17 ENCOUNTER — Encounter: Payer: Self-pay | Admitting: Internal Medicine

## 2011-11-17 ENCOUNTER — Encounter (HOSPITAL_COMMUNITY): Payer: Self-pay | Admitting: Emergency Medicine

## 2011-11-17 ENCOUNTER — Other Ambulatory Visit: Payer: Self-pay | Admitting: Neurology

## 2011-11-17 ENCOUNTER — Emergency Department (HOSPITAL_COMMUNITY): Payer: MEDICARE

## 2011-11-17 DIAGNOSIS — R51 Headache: Secondary | ICD-10-CM

## 2011-11-17 DIAGNOSIS — R269 Unspecified abnormalities of gait and mobility: Secondary | ICD-10-CM

## 2011-11-17 LAB — DIFFERENTIAL
Basophils Absolute: 0 10*3/uL (ref 0.0–0.1)
Basophils Relative: 0 % (ref 0–1)
Eosinophils Relative: 1 % (ref 0–5)
Lymphocytes Relative: 29 % (ref 12–46)
Monocytes Absolute: 1.1 10*3/uL — ABNORMAL HIGH (ref 0.1–1.0)
Monocytes Relative: 14 % — ABNORMAL HIGH (ref 3–12)
Neutro Abs: 4.3 10*3/uL (ref 1.7–7.7)

## 2011-11-17 LAB — CBC
HCT: 35 % — ABNORMAL LOW (ref 39.0–52.0)
Hemoglobin: 11.9 g/dL — ABNORMAL LOW (ref 13.0–17.0)
MCHC: 34 g/dL (ref 30.0–36.0)
MCV: 98.9 fL (ref 78.0–100.0)
RDW: 15.1 % (ref 11.5–15.5)

## 2011-11-17 LAB — URINALYSIS, ROUTINE W REFLEX MICROSCOPIC
Bilirubin Urine: NEGATIVE
Glucose, UA: NEGATIVE mg/dL
Hgb urine dipstick: NEGATIVE
Specific Gravity, Urine: 1.01 (ref 1.005–1.030)
Urobilinogen, UA: 0.2 mg/dL (ref 0.0–1.0)
pH: 7 (ref 5.0–8.0)

## 2011-11-17 LAB — COMPREHENSIVE METABOLIC PANEL
AST: 17 U/L (ref 0–37)
BUN: 13 mg/dL (ref 6–23)
CO2: 26 mEq/L (ref 19–32)
Calcium: 9.4 mg/dL (ref 8.4–10.5)
Chloride: 106 mEq/L (ref 96–112)
Creatinine, Ser: 0.82 mg/dL (ref 0.50–1.35)
GFR calc Af Amer: >90 mL/min (ref >90)
GFR calc non Af Amer: 78 mL/min — ABNORMAL LOW (ref >90)
Total Bilirubin: 0.8 mg/dL (ref 0.3–1.2)

## 2011-11-17 LAB — RAPID URINE DRUG SCREEN, HOSP PERFORMED
Cocaine: NOT DETECTED
Opiates: NOT DETECTED

## 2011-11-17 LAB — URINE MICROSCOPIC-ADD ON

## 2011-11-17 LAB — ETHANOL: Alcohol, Ethyl (B): <11 mg/dL (ref 0–11)

## 2011-11-17 MED ORDER — ACETAMINOPHEN 325 MG PO TABS
650.0000 mg | ORAL_TABLET | ORAL | Status: DC | PRN
Start: 2011-11-17 — End: 2011-11-17

## 2011-11-17 MED ORDER — ATENOLOL 50 MG PO TABS
50.0000 mg | ORAL_TABLET | Freq: Every day | ORAL | Status: DC
  Administered 2011-11-17: 50 mg via ORAL
  Filled 2011-11-17: qty 1

## 2011-11-17 MED ORDER — CLONAZEPAM 0.5 MG PO TABS: 0.5000 mg | ORAL_TABLET | Freq: Two times a day (BID) | ORAL | Status: DC | PRN

## 2011-11-17 MED ORDER — LIDOCAINE 5 % EX PTCH
1.0000 | MEDICATED_PATCH | Freq: Every day | CUTANEOUS | Status: DC | PRN
  Filled 2011-11-17: qty 1

## 2011-11-17 MED ORDER — ASPIRIN EC 81 MG PO TBEC
81.0000 mg | DELAYED_RELEASE_TABLET | Freq: Every day | ORAL | Status: DC
  Administered 2011-11-17: 81 mg via ORAL
  Filled 2011-11-17: qty 1

## 2011-11-17 MED ORDER — ALUM & MAG HYDROXIDE-SIMETH 200-200-20 MG/5ML PO SUSP: 30.0000 mL | ORAL | Status: DC | PRN

## 2011-11-17 MED ORDER — METFORMIN HCL 500 MG PO TABS
1000.0000 mg | ORAL_TABLET | Freq: Two times a day (BID) | ORAL | Status: DC
  Administered 2011-11-17: 1000 mg via ORAL
  Filled 2011-11-17 (×3): qty 2

## 2011-11-17 MED ORDER — ONDANSETRON HCL 4 MG PO TABS: 4.0000 mg | ORAL_TABLET | Freq: Three times a day (TID) | ORAL | Status: DC | PRN

## 2011-11-17 MED ORDER — HYDROCHLOROTHIAZIDE 12.5 MG PO CAPS
12.5000 mg | ORAL_CAPSULE | Freq: Every day | ORAL | Status: DC
  Administered 2011-11-17: 12.5 mg via ORAL
  Filled 2011-11-17: qty 1

## 2011-11-17 MED ORDER — GLIPIZIDE ER 10 MG PO TB24
10.0000 mg | ORAL_TABLET | Freq: Every day | ORAL | Status: DC
  Administered 2011-11-17: 10 mg via ORAL
  Filled 2011-11-17 (×2): qty 1

## 2011-11-17 MED ORDER — AMLODIPINE BESYLATE 10 MG PO TABS
10.0000 mg | ORAL_TABLET | Freq: Every day | ORAL | Status: DC
  Administered 2011-11-17: 10 mg via ORAL
  Filled 2011-11-17: qty 1

## 2011-11-17 MED ORDER — PREGABALIN 25 MG PO CAPS
75.0000 mg | ORAL_CAPSULE | Freq: Two times a day (BID) | ORAL | Status: DC
  Administered 2011-11-17: 75 mg via ORAL
  Filled 2011-11-17: qty 3

## 2011-11-17 MED ORDER — POTASSIUM CHLORIDE CRYS ER 20 MEQ PO TBCR
20.0000 meq | EXTENDED_RELEASE_TABLET | Freq: Every day | ORAL | Status: DC
  Administered 2011-11-17: 20 meq via ORAL
  Filled 2011-11-17: qty 2
  Filled 2011-11-17: qty 1

## 2011-11-17 MED ORDER — SIMVASTATIN 40 MG PO TABS
40.0000 mg | ORAL_TABLET | Freq: Every day | ORAL | Status: DC
  Filled 2011-11-17: qty 1

## 2011-11-17 MED ORDER — POTASSIUM CHLORIDE 20 MEQ/15ML (10%) PO LIQD
40.0000 meq | Freq: Once | ORAL | Status: AC
  Administered 2011-11-17: 40 meq via ORAL
  Filled 2011-11-17: qty 30

## 2011-11-17 MED ORDER — VALSARTAN-HYDROCHLOROTHIAZIDE 320-12.5 MG PO TABS: 1.0000 | ORAL_TABLET | Freq: Every day | ORAL | Status: DC

## 2011-11-17 MED ORDER — DICLOFENAC SODIUM 1 % TD GEL
1.0000 "application " | Freq: Four times a day (QID) | TRANSDERMAL | Status: DC
  Administered 2011-11-17: 1 via TOPICAL
  Filled 2011-11-17: qty 100

## 2011-11-17 MED ORDER — MIRTAZAPINE 15 MG PO TBDP
15.0000 mg | ORAL_TABLET | Freq: Every day | ORAL | Status: DC
  Filled 2011-11-17: qty 1

## 2011-11-17 MED ORDER — IRBESARTAN 300 MG PO TABS
300.0000 mg | ORAL_TABLET | Freq: Every day | ORAL | Status: DC
  Administered 2011-11-17: 300 mg via ORAL
  Filled 2011-11-17: qty 1
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
## 2011-11-17 NOTE — ED Notes (Signed)
Attempted  blood draw but pt uncooperative, states "I don't like to be stuck with needles anymore-- I am going to be checked tomorrow by my doctor anyway, I am going to have MRI".
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
## 2011-11-17 NOTE — ED Notes (Signed)
Telepsych complete

## 2011-11-17 NOTE — ED Notes (Signed)
Unable to get labs x2 attempts. Pt has veins in his hands and asked to stick there but pt will not allow it.

## 2011-11-17 NOTE — ED Notes (Addendum)
Report given to [facility] prior to interview.

## 2011-11-17 NOTE — ED Notes (Signed)
Tele-psych request has been faxed. Camera ready at bedside. Psychiatrist to call-in approximately 1 hr.
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
## 2011-11-17 NOTE — ED Notes (Signed)
Pt, son, and spouse verbalized understanding of discharge instructions.  Pt assessment is stable, calm, and comfortable.

## 2011-11-17 NOTE — Discharge Instructions (Signed)
 RESOURCE GUIDE  Dental Problems  Patients with Medicaid: [facility]                     [facility] [address]                                           [address] Phone:  [phone]                                                  Phone:  [phone]  If unable to pay or uninsured, contact:  [facility] or [facility] to become qualified for the adult dental clinic.  Chronic Pain Problems Contact [facility]  [phone] Patients need to be referred by their primary care doctor.  Insufficient Money for Medicine Contact [employer]:  call "211" or [facility] 271-5999.  No Primary Care Doctor Call [facility]  832-8000 Other agencies that provide inexpensive medical care    [facility]  [phone]    [facility]  [phone]    [facility]  [phone]    Women[facility] Clinic  [phone]    [facility]  [phone]    [facility]  [phone]  Psychological Services [facility]  [phone] [facility]  [phone] [facility]   800 853-5163 (emergency services 641-4993)  Substance Abuse Resources [facility]  336-882-2125 [facility] 336-784-9470 [facility] 336-285-9073 [facility] 336-845-3988 Residential & Outpatient Substance Abuse Program  800-659-3381  Abuse/Neglect [place] Child Abuse Hotline (336) 641-3795 [place] Child Abuse Hotline 800-378-5315 (After Hours)  Emergency Shelter  [facility] (336) 271-5985  [facility] (336) 275-9566 [facility] (704) 372-4663  MRSA Hotline #:   832-7006    [place] Resources  [facility] [place]     [employer]                          [facility] [address] [place]                       [address]      [address]  [place]                                                [place]                            [place] Phone:  [phone]                                   Phone:  [phone]                 Phone:  [phone]  [facility] Phone:    [phone]  [place] Child Abuse Hotline (336) 342-1394 (336) 342-3537 (After Hours)   

## 2011-11-17 NOTE — ED Notes (Signed)
Pt is calm and persuaded to lay in the Stetcher, which  he agreed, pt is now in the stretcher trying to get some sleep.

## 2011-11-20 ENCOUNTER — Ambulatory Visit: Payer: Medicare Other | Admitting: Internal Medicine

## 2011-11-20 ENCOUNTER — Ambulatory Visit
Admission: RE | Admit: 2011-11-20 | Discharge: 2011-11-20 | Disposition: A | Discharge status: Home / Self Care | Payer: MEDICARE | Source: Ambulatory Visit | Attending: Neurology | Admitting: Neurology

## 2011-11-20 DIAGNOSIS — R269 Unspecified abnormalities of gait and mobility: Secondary | ICD-10-CM

## 2011-11-20 DIAGNOSIS — R51 Headache: Secondary | ICD-10-CM

## 2011-11-30 ENCOUNTER — Telehealth: Payer: Self-pay | Admitting: Internal Medicine
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
## 2011-11-30 NOTE — Telephone Encounter (Signed)
He needs to take both meds - they will help to keep him calm. Call 911 if he is  very agitated Thx

## 2011-12-28 ENCOUNTER — Encounter: Payer: Self-pay | Admitting: Internal Medicine

## 2011-12-28 ENCOUNTER — Ambulatory Visit (INDEPENDENT_AMBULATORY_CARE_PROVIDER_SITE_OTHER): Payer: MEDICARE | Admitting: Internal Medicine

## 2011-12-28 VITALS — BP 130/70 | HR 80 | Temp 98.1°F | Resp 16 | Wt 134.8 lb

## 2011-12-28 DIAGNOSIS — I10 Essential (primary) hypertension: Secondary | ICD-10-CM

## 2011-12-28 DIAGNOSIS — F22 Delusional disorders: Secondary | ICD-10-CM

## 2011-12-28 DIAGNOSIS — F329 Major depressive disorder, single episode, unspecified: Secondary | ICD-10-CM

## 2011-12-28 DIAGNOSIS — F0390 Unspecified dementia without behavioral disturbance: Secondary | ICD-10-CM

## 2011-12-28 DIAGNOSIS — E538 Deficiency of other specified B group vitamins: Secondary | ICD-10-CM

## 2011-12-28 DIAGNOSIS — E119 Type 2 diabetes mellitus without complications: Secondary | ICD-10-CM

## 2011-12-28 MED ORDER — DONEPEZIL HCL 10 MG PO TABS: 10.0000 mg | ORAL_TABLET | Freq: Every day | ORAL | Status: DC

## 2011-12-28 MED ORDER — CYANOCOBALAMIN 1000 MCG/ML IJ SOLN
1000.0000 ug | Freq: Once | INTRAMUSCULAR | Status: AC
  Administered 2011-12-28: 1000 ug via INTRAMUSCULAR

## 2011-12-28 NOTE — Progress Notes (Signed)
Subjective:    Patient ID: [name], male    DOB: 07-13-1924, 78 y.o.   MRN: [id number]  HPI  He was hospitalized in [place] for 2 wks for bradycardia, agitation  He was in the mental hospital x 4-5 d, then to medical part of the hospital for ?ileus    The patient presents for a follow-up of  chronic hypertension, chronic dyslipidemia, type 2 diabetes better controlled, gait issues, LBP 8/10 at times, may be better with medicines - Lyrica prn; not using Sinemet, Duragesic. He started Remeron  - not taking  He was paranoid again recently about wife having an affair etc. He is supposed to be on Namenda and risperidone, but is refusing to take them. He is taking Aricept    Wt Readings from Last 3 Encounters:  12/28/11 134 lb 12.8 oz (61.145 kg)  11/16/11 140 lb (63.504 kg)  11/04/11 140 lb (63.504 kg)   BP Readings from Last 3 Encounters:  12/28/11 130/70  11/17/11 130/47  11/16/11 120/60     Review of Systems  Constitutional: Positive for fatigue. Negative for appetite change and unexpected weight change.  HENT: Negative for nosebleeds, congestion, sneezing and trouble swallowing.   Eyes: Negative for itching and visual disturbance.  Cardiovascular: Negative for palpitations and leg swelling.  Gastrointestinal: Negative for diarrhea, blood in stool and abdominal distention.  Genitourinary: Negative for frequency and hematuria.  Musculoskeletal: Positive for gait problem. Negative for joint swelling.  Skin: Negative for rash.  Neurological: Negative for tremors and speech difficulty.  Psychiatric/Behavioral: Positive for decreased concentration. Negative for suicidal ideas, disturbed wake/sleep cycle, dysphoric mood and agitation. The patient is nervous/anxious.        Objective:   Physical Exam  Constitutional: He is oriented to person, place, and time. He appears well-developed. No distress.  HENT:  Mouth/Throat: Oropharynx is clear and moist.  Eyes:  Conjunctivae are normal. Pupils are equal, round, and reactive to light.  Neck: Normal range of motion. No JVD present. No thyromegaly present.  Cardiovascular: Normal rate, regular rhythm, normal heart sounds and intact distal pulses.  Exam reveals no gallop and no friction rub.   No murmur heard. Pulmonary/Chest: Effort normal and breath sounds normal. No respiratory distress. He has no wheezes. He has no rales. He exhibits no tenderness.  Abdominal: Soft. Bowel sounds are normal. He exhibits no distension and no mass. There is no tenderness. There is no rebound and no guarding.  Musculoskeletal: Normal range of motion. He exhibits no edema and no tenderness.  Lymphadenopathy:    He has no cervical adenopathy.  Neurological: He is alert and oriented to person, place, and time. He displays abnormal reflex. No cranial nerve deficit. He exhibits abnormal muscle tone. Coordination abnormal.       W/c Arms w/cogwheel rigidity  Skin: Skin is warm and dry. No rash noted.  Psychiatric: His behavior is normal. Judgment and thought content normal.       depressed    Not angry; calm, subdude. He is not hostile, paranoid    Lab Results  Component Value Date   WBC 7.6 11/17/2011   HGB 11.9* 11/17/2011   HCT 35.0* 11/17/2011   PLT 219 11/17/2011   GLUCOSE 125* 11/17/2011   CHOL 98 10/13/2010   TRIG 112.0 10/13/2010   HDL 33.60* 10/13/2010   LDLDIRECT 141.6 07/11/2007   LDLCALC 42 10/13/2010   ALT 12 11/17/2011   AST 17 11/17/2011   NA 142 11/17/2011   K 3.1* 11/17/2011  CL 106 11/17/2011   CREATININE 0.82 11/17/2011   BUN 13 11/17/2011   CO2 26 11/17/2011   TSH 1.25 06/03/2011   PSA 1.24 10/13/2010   INR 0.9 RATIO 07/27/2006   HGBA1C 5.7 06/03/2011    Inpatient notes were reviewed      Assessment & Plan:

## 2011-12-28 NOTE — Assessment & Plan Note (Signed)
Continue with current prescription therapy as reflected on the Med list.  

## 2011-12-28 NOTE — Assessment & Plan Note (Signed)
Chronic, situational. He has some paranoid features. 2/13 more paranoid 6/13 hospitalized  He was suggested to restart Risperdone

## 2011-12-28 NOTE — Assessment & Plan Note (Addendum)
2012 depression and dementia related  Potential benefits of a long term zyprexa use as well as potential risks  and complications were explained to the patient and were aknowledged. Hospitalized in [place] x 10 d inpatient mental health -- in 6/13  He is refusing meds. Hard to manage

## 2011-12-28 NOTE — Assessment & Plan Note (Signed)
Continue with current prescription therapy as reflected on the Med list. Poor compliance

## 2012-01-20 ENCOUNTER — Ambulatory Visit: Payer: MEDICARE | Admitting: Internal Medicine

## 2012-02-10 ENCOUNTER — Telehealth: Payer: Self-pay | Admitting: Internal Medicine

## 2012-02-10 ENCOUNTER — Encounter: Payer: Self-pay | Admitting: Internal Medicine

## 2012-02-10 ENCOUNTER — Ambulatory Visit (INDEPENDENT_AMBULATORY_CARE_PROVIDER_SITE_OTHER): Payer: MEDICARE | Admitting: Internal Medicine

## 2012-02-10 VITALS — BP 130/70 | HR 71 | Temp 98.4°F | Resp 14 | Wt 134.0 lb

## 2012-02-10 DIAGNOSIS — E119 Type 2 diabetes mellitus without complications: Secondary | ICD-10-CM

## 2012-02-10 DIAGNOSIS — Z1211 Encounter for screening for malignant neoplasm of colon: Secondary | ICD-10-CM

## 2012-02-10 DIAGNOSIS — M545 Low back pain: Secondary | ICD-10-CM

## 2012-02-10 DIAGNOSIS — F329 Major depressive disorder, single episode, unspecified: Secondary | ICD-10-CM

## 2012-02-10 DIAGNOSIS — F0390 Unspecified dementia without behavioral disturbance: Secondary | ICD-10-CM

## 2012-02-10 DIAGNOSIS — E538 Deficiency of other specified B group vitamins: Secondary | ICD-10-CM

## 2012-02-10 DIAGNOSIS — F22 Delusional disorders: Secondary | ICD-10-CM

## 2012-02-10 DIAGNOSIS — I10 Essential (primary) hypertension: Secondary | ICD-10-CM

## 2012-02-10 LAB — GLUCOSE, POCT (MANUAL RESULT ENTRY): POC Glucose: 113 mg/dl — AB (ref 70–99)

## 2012-02-10 MED ORDER — ZOLPIDEM TARTRATE 10 MG PO TABS: 5.0000 mg | ORAL_TABLET | Freq: Every evening | ORAL | Status: DC | PRN

## 2012-02-10 MED ORDER — MEMANTINE HCL ER 28 MG PO CP24: 1.0000 | ORAL_CAPSULE | Freq: Every day | ORAL | Status: DC

## 2012-02-10 MED ORDER — CLONAZEPAM 0.5 MG PO TABS: 0.5000 mg | ORAL_TABLET | Freq: Two times a day (BID) | ORAL | Status: DC | PRN

## 2012-02-10 MED ORDER — CYANOCOBALAMIN 1000 MCG/ML IJ SOLN: 1000.0000 ug | INTRAMUSCULAR | Status: DC

## 2012-02-10 MED ORDER — IPRATROPIUM BROMIDE 0.06 % NA SOLN: 2.0000 | Freq: Three times a day (TID) | NASAL | Status: DC

## 2012-02-10 MED ORDER — CYANOCOBALAMIN 1000 MCG/ML IJ SOLN
1000.0000 ug | Freq: Once | INTRAMUSCULAR | Status: AC
  Administered 2012-02-10: 1000 ug via INTRAMUSCULAR

## 2012-02-10 NOTE — Progress Notes (Signed)
Subjective:    Patient ID: [name], male    DOB: 1924-07-08, 78 y.o.   MRN: [id number]  HPI   The patient presents for a follow-up of  chronic hypertension, chronic dyslipidemia, type 2 diabetes better controlled, gait issues, LBP 8/10 at times, may be better with medicines - Lyrica prn; not using Sinemet, Duragesic. He started Remeron  - not taking  They saw a psychiatrist - given Seroquel - did not help - they stopped Zolpidem  He was paranoid again recently about wife having an affair etc. He is supposed to be on Namenda and risperidone, but is refusing to take them. He is taking Aricept    Wt Readings from Last 3 Encounters:  02/10/12 134 lb (60.782 kg)  12/28/11 134 lb 12.8 oz (61.145 kg)  11/16/11 140 lb (63.504 kg)   BP Readings from Last 3 Encounters:  02/10/12 130/70  12/28/11 130/70  11/17/11 130/47     Review of Systems  Constitutional: Positive for fatigue. Negative for appetite change and unexpected weight change.  HENT: Negative for nosebleeds, congestion, sneezing and trouble swallowing.   Eyes: Negative for itching and visual disturbance.  Cardiovascular: Negative for palpitations and leg swelling.  Gastrointestinal: Negative for diarrhea, blood in stool and abdominal distention.  Genitourinary: Negative for frequency and hematuria.  Musculoskeletal: Positive for gait problem. Negative for joint swelling.  Skin: Negative for rash.  Neurological: Negative for tremors and speech difficulty.  Psychiatric/Behavioral: Positive for decreased concentration. Negative for suicidal ideas, disturbed wake/sleep cycle, dysphoric mood and agitation. The patient is nervous/anxious.        Objective:   Physical Exam  Constitutional: He is oriented to person, place, and time. He appears well-developed. No distress.  HENT:  Mouth/Throat: Oropharynx is clear and moist.  Eyes: Conjunctivae are normal. Pupils are equal, round, and reactive to light.  Neck: Normal  range of motion. No JVD present. No thyromegaly present.  Cardiovascular: Normal rate, regular rhythm, normal heart sounds and intact distal pulses.  Exam reveals no gallop and no friction rub.   No murmur heard. Pulmonary/Chest: Effort normal and breath sounds normal. No respiratory distress. He has no wheezes. He has no rales. He exhibits no tenderness.  Abdominal: Soft. Bowel sounds are normal. He exhibits no distension and no mass. There is no tenderness. There is no rebound and no guarding.  Musculoskeletal: Normal range of motion. He exhibits no edema and no tenderness.  Lymphadenopathy:    He has no cervical adenopathy.  Neurological: He is alert and oriented to person, place, and time. He displays abnormal reflex. No cranial nerve deficit. He exhibits abnormal muscle tone. Coordination abnormal.       W/c Arms w/cogwheel rigidity  Skin: Skin is warm and dry. No rash noted.  Psychiatric: His behavior is normal. Judgment and thought content normal.       depressed    Not angry; calm, subdude. He is not hostile, paranoid    Lab Results  Component Value Date   WBC 7.6 11/17/2011   HGB 11.9* 11/17/2011   HCT 35.0* 11/17/2011   PLT 219 11/17/2011   GLUCOSE 125* 11/17/2011   CHOL 98 10/13/2010   TRIG 112.0 10/13/2010   HDL 33.60* 10/13/2010   LDLDIRECT 141.6 07/11/2007   LDLCALC 42 10/13/2010   ALT 12 11/17/2011   AST 17 11/17/2011   NA 142 11/17/2011   K 3.1* 11/17/2011   CL 106 11/17/2011   CREATININE 0.82 11/17/2011   BUN 13 11/17/2011  CO2 26 11/17/2011   TSH 1.25 06/03/2011   PSA 1.24 10/13/2010   INR 0.9 RATIO 07/27/2006   HGBA1C 5.7 06/03/2011    Inpatient notes were reviewed      Assessment & Plan:

## 2012-02-10 NOTE — Assessment & Plan Note (Signed)
Continue with current prescription therapy as reflected on the Med list.  

## 2012-02-10 NOTE — Assessment & Plan Note (Signed)
Continue with current prescription therapy as reflected on the Med list. 8/13 he saw a psychiatrist Dr Jennelle Human

## 2012-02-10 NOTE — Assessment & Plan Note (Signed)
8/13 he saw a psychiatrist Dr Jennelle Human Pt stopped/refused Seroquel

## 2012-02-10 NOTE — Assessment & Plan Note (Signed)
Continue with current prescription therapy as reflected on the Med list. Namenda XR

## 2012-02-10 NOTE — Telephone Encounter (Signed)
At his age it is best not to have a screening colonoscopy test. The risk of complications is high and the benefits are low. Thx

## 2012-02-10 NOTE — Telephone Encounter (Signed)
[name] wants to be set up for a routine colonoscopy.  His niece was dx'd with colon cancer.  They are going out of town next Thursday for 2 weeks.    [name] transfers her calls to her cell [phone]) when they travel.  She can be reached on that phone.

## 2012-02-12 NOTE — Telephone Encounter (Signed)
I called [name].  He says his brother died from colon cancer also.  He still wants to do it.  Is there something else with less risk that he could do?  He also wants to come in and talk to you about it.

## 2012-02-13 NOTE — Telephone Encounter (Signed)
I can have him see a gastroenterologist to discuss this matter Thx

## 2012-02-15 NOTE — Telephone Encounter (Signed)
I spoke with Mrs. [name].  She wants the referral to GI for [name] to discuss the colonoscopy.  [name] keeps asking about it.

## 2012-03-01 ENCOUNTER — Ambulatory Visit: Payer: MEDICARE | Admitting: Internal Medicine

## 2012-03-03 ENCOUNTER — Encounter: Payer: Self-pay | Admitting: Gastroenterology

## 2012-03-10 ENCOUNTER — Encounter: Payer: Self-pay | Admitting: Gastroenterology

## 2012-03-16 ENCOUNTER — Ambulatory Visit (INDEPENDENT_AMBULATORY_CARE_PROVIDER_SITE_OTHER): Payer: MEDICARE

## 2012-03-16 DIAGNOSIS — Z23 Encounter for immunization: Secondary | ICD-10-CM

## 2012-03-22 ENCOUNTER — Emergency Department (HOSPITAL_COMMUNITY): Payer: MEDICARE

## 2012-03-22 ENCOUNTER — Emergency Department (HOSPITAL_COMMUNITY)
Admission: EM | Admit: 2012-03-22 | Discharge: 2012-03-22 | Disposition: A | Discharge status: Home / Self Care | Payer: MEDICARE | Attending: Emergency Medicine | Admitting: Emergency Medicine

## 2012-03-22 ENCOUNTER — Encounter (HOSPITAL_COMMUNITY): Payer: Self-pay

## 2012-03-22 DIAGNOSIS — R011 Cardiac murmur, unspecified: Secondary | ICD-10-CM | POA: Insufficient documentation

## 2012-03-22 DIAGNOSIS — R109 Unspecified abdominal pain: Secondary | ICD-10-CM | POA: Insufficient documentation

## 2012-03-22 DIAGNOSIS — R5383 Other fatigue: Secondary | ICD-10-CM | POA: Insufficient documentation

## 2012-03-22 DIAGNOSIS — Z79899 Other long term (current) drug therapy: Secondary | ICD-10-CM | POA: Insufficient documentation

## 2012-03-22 DIAGNOSIS — R42 Dizziness and giddiness: Secondary | ICD-10-CM | POA: Insufficient documentation

## 2012-03-22 DIAGNOSIS — R51 Headache: Secondary | ICD-10-CM

## 2012-03-22 DIAGNOSIS — E119 Type 2 diabetes mellitus without complications: Secondary | ICD-10-CM | POA: Insufficient documentation

## 2012-03-22 DIAGNOSIS — N390 Urinary tract infection, site not specified: Secondary | ICD-10-CM | POA: Insufficient documentation

## 2012-03-22 DIAGNOSIS — R5381 Other malaise: Secondary | ICD-10-CM | POA: Insufficient documentation

## 2012-03-22 DIAGNOSIS — I10 Essential (primary) hypertension: Secondary | ICD-10-CM | POA: Insufficient documentation

## 2012-03-22 LAB — BASIC METABOLIC PANEL
CO2: 27 mEq/L (ref 19–32)
Chloride: 100 mEq/L (ref 96–112)
Glucose, Bld: 148 mg/dL — ABNORMAL HIGH (ref 70–99)
Potassium: 5.5 mEq/L — ABNORMAL HIGH (ref 3.5–5.1)
Sodium: 137 mEq/L (ref 135–145)

## 2012-03-22 LAB — GLUCOSE, CAPILLARY: Glucose-Capillary: 180 mg/dL — ABNORMAL HIGH (ref 70–99)

## 2012-03-22 LAB — URINALYSIS, ROUTINE W REFLEX MICROSCOPIC
Bilirubin Urine: NEGATIVE
Ketones, ur: NEGATIVE mg/dL
Nitrite: NEGATIVE
Urobilinogen, UA: 0.2 mg/dL (ref 0.0–1.0)

## 2012-03-22 LAB — CBC
Hemoglobin: 13.8 g/dL (ref 13.0–17.0)
RBC: 4.09 MIL/uL — ABNORMAL LOW (ref 4.22–5.81)

## 2012-03-22 MED ORDER — ACETAMINOPHEN 500 MG PO TABS
1000.0000 mg | ORAL_TABLET | Freq: Once | ORAL | Status: AC
  Administered 2012-03-22: 1000 mg via ORAL
  Filled 2012-03-22: qty 2

## 2012-03-22 MED ORDER — CEPHALEXIN 500 MG PO CAPS
500.0000 mg | ORAL_CAPSULE | Freq: Once | ORAL | Status: AC
  Administered 2012-03-22: 500 mg via ORAL
  Filled 2012-03-22: qty 1

## 2012-03-22 MED ORDER — CEPHALEXIN 500 MG PO CAPS: 500.0000 mg | ORAL_CAPSULE | Freq: Four times a day (QID) | ORAL | Status: DC

## 2012-03-22 NOTE — ED Provider Notes (Signed)
History     CSN: [id number]  Arrival date & time 03/22/12  1415   First MD Initiated Contact with Patient 03/22/12 1642      Chief Complaint  Patient presents with  . Headache    (Consider location/radiation/quality/duration/timing/severity/associated sxs/prior treatment) HPI Comments: Pt also recently started on lexapro in the last week since sx have started.  Patient is a 78 y.o. male presenting with headaches. The history is provided by the patient and the spouse.  Headache  This is a new problem. Episode onset: 1 week. Episode frequency: intermittent. The problem has not changed since onset.The headache is associated with nothing. The pain is located in the occipital region. The quality of the pain is described as dull. The pain is at a severity of 3/10. The pain is mild. The pain does not radiate. Pertinent negatives include no fever, no shortness of breath, no nausea and no vomiting. Associated symptoms comments: States he has felt somewhat dizzy and intermittent urinary urgency and frequency with some left flank pain. He has tried nothing for the symptoms. The treatment provided no relief.    Past Medical History  Diagnosis Date  . Carotid artery stenosis   . Gait disturbance   . Anxiety   . GERD (gastroesophageal reflux disease)   . Vitamin B12 deficiency   . Depression   . Osteoarthritis   . Diabetes mellitus, type 2   . Hypertension   . Vitamin d deficiency   . Tremor   . Aortic stenosis   . Hyperlipidemia     Past Surgical History  Procedure Date  . Vasectomy   . Lumbar laminectomy     L5 Dr Ophelia Charter  . Transurethral resection of prostate March 2007    Family History  Problem Relation Age of Onset  . Diabetes Father     History  Substance Use Topics  . Smoking status: Former Smoker    Types: Cigars  . Smokeless tobacco: Not on file  . Alcohol Use: No      Review of Systems  Constitutional: Negative for fever.  Respiratory: Negative for cough and  shortness of breath.   Cardiovascular: Negative for chest pain.  Gastrointestinal: Negative for nausea, vomiting and abdominal pain.       Left flank pain.  Genitourinary: Positive for urgency and frequency.       Strong smelling urine  Neurological: Positive for headaches.  All other systems reviewed and are negative.    Allergies  Citalopram hydrobromide and Duloxetine  Home Medications   Current Outpatient Rx  Name Route Sig Dispense Refill  . ACETAMINOPHEN 500 MG PO TABS Oral Take 1,000 mg by mouth every 6 (six) hours as needed. For pain.    Marland Kitchen AMLODIPINE BESYLATE 10 MG PO TABS Oral Take 10 mg by mouth daily.    . ASPIRIN EC 81 MG PO TBEC Oral Take 81 mg by mouth daily.    . ATENOLOL 50 MG PO TABS Oral Take 25 mg by mouth daily as needed. For palpitations.    . CHOLECALCIFEROL 1000 UNITS PO CAPS Oral Take 1,000 Units by mouth daily.     Marland Kitchen CLONAZEPAM 0.5 MG PO TABS Oral Take 0.5 mg by mouth 3 (three) times daily as needed. For anxiety.    . CYANOCOBALAMIN 1000 MCG/ML IJ SOLN Intramuscular Inject 1,000 mcg into the muscle every 14 (fourteen) days.    Marland Kitchen DICLOFENAC SODIUM 1 % TD GEL Topical Apply 2 g topically 4 (four) times daily as needed. For  joint pain.    . DONEPEZIL HCL 5 MG PO TABS Oral Take 5 mg by mouth at bedtime.    Marland Kitchen ESCITALOPRAM OXALATE 5 MG PO TABS Oral Take 5 mg by mouth daily.    . FENTANYL 25 MCG/HR TD PT72 Transdermal Place 1 patch onto the skin every 3 (three) days.    Marland Kitchen GLIPIZIDE ER 10 MG PO TB24 Oral Take 1 tablet (10 mg total) by mouth daily. 90 tablet 3  . IPRATROPIUM BROMIDE 0.06 % NA SOLN Nasal Place 2 sprays into the nose 3 (three) times daily as needed. For dryness.    . METFORMIN HCL 1000 MG PO TABS Oral Take 500-1,000 mg by mouth 2 (two) times daily with a meal. 1 tab in am, 0.5 tab in pm    . ICAPS PO Oral Take 2 capsules by mouth daily.    Marland Kitchen POTASSIUM CHLORIDE 10 MEQ PO TBCR Oral Take 1 tablet (10 mEq total) by mouth daily. 90 tablet 3  . VITAMIN B-6 PO  Oral Take 1 tablet by mouth daily.    Marland Kitchen SIMVASTATIN 40 MG PO TABS Oral Take 1 tablet (40 mg total) by mouth at bedtime. 90 tablet 3  . VALSARTAN-HYDROCHLOROTHIAZIDE 320-12.5 MG PO TABS Oral Take 1 tablet by mouth daily. 90 tablet 3  . VITAMIN C 500 MG PO TABS Oral Take 500 mg by mouth daily.    Marland Kitchen ZOLPIDEM TARTRATE 10 MG PO TABS Oral Take 10-15 mg by mouth at bedtime.    . CEPHALEXIN 500 MG PO CAPS Oral Take 1 capsule (500 mg total) by mouth 4 (four) times daily. 28 capsule 0    BP 138/74  Pulse 79  Temp 98.8 F (37.1 C) (Oral)  Resp 22  SpO2 98%  Physical Exam  Nursing note and vitals reviewed. Constitutional: He is oriented to person, place, and time. He appears well-developed and well-nourished. No distress.  HENT:  Head: Normocephalic and atraumatic.  Mouth/Throat: Oropharynx is clear and moist.  Eyes: Conjunctivae normal and EOM are normal. Pupils are equal, round, and reactive to light.  Neck: Normal range of motion. Neck supple.  Cardiovascular: Normal rate, regular rhythm and intact distal pulses.   Murmur heard. Pulmonary/Chest: Effort normal and breath sounds normal. No respiratory distress. He has no wheezes. He has no rales.  Abdominal: Soft. He exhibits no distension. There is no tenderness. There is CVA tenderness. There is no rebound and no guarding.       Mild CVA tenderness on the left  Musculoskeletal: Normal range of motion. He exhibits no edema and no tenderness.  Neurological: He is alert and oriented to person, place, and time.  Skin: Skin is warm and dry. No rash noted. No erythema.  Psychiatric: He has a normal mood and affect. His behavior is normal.    ED Course  Procedures (including critical care time)  Labs Reviewed  GLUCOSE, CAPILLARY - Abnormal; Notable for the following:    Glucose-Capillary 180 (*)     All other components within normal limits  CBC - Abnormal; Notable for the following:    RBC 4.09 (*)     All other components within normal  limits  BASIC METABOLIC PANEL - Abnormal; Notable for the following:    Potassium 5.5 (*)  MODERATE HEMOLYSIS   Glucose, Bld 148 (*)     GFR calc non Af Amer 78 (*)     All other components within normal limits  URINALYSIS, ROUTINE W REFLEX MICROSCOPIC - Abnormal; Notable  for the following:    Protein, ur 100 (*)     Leukocytes, UA TRACE (*)     All other components within normal limits  URINE MICROSCOPIC-ADD ON - Abnormal; Notable for the following:    Bacteria, UA FEW (*)     Casts HYALINE CASTS (*)     All other components within normal limits  URINE CULTURE   Ct Head Wo Contrast  03/22/2012  *RADIOLOGY REPORT*  Clinical Data: 78 year old male headache dizziness weakness.  CT HEAD WITHOUT CONTRAST  Technique:  Contiguous axial images were obtained from the base of the skull through the vertex without contrast.  Comparison: Brain MRI 11/20/2011 and earlier.  Findings: Visualized paranasal sinuses and mastoids are clear.  No acute orbit or scalp soft tissue findings.  Calcified atherosclerosis in the scalp and at the skull base. No acute osseous abnormality identified.  Chronic confluent cerebral white matter hypodensity and chronic lacunar infarcts in the right corona radiata and right internal capsule.  Occasional chronic lacunar infarcts in the right cerebellum. Stable gray-white matter differentiation throughout the brain.  No ventriculomegaly. No midline shift, mass effect, or evidence of mass lesion.  No evidence of cortically based acute infarction identified.  No acute intracranial hemorrhage identified.  No suspicious intracranial vascular hyperdensity.  IMPRESSION: Stable chronic small vessel ischemia. No acute intracranial abnormality.   Original Report Authenticated By: Ulla Potash III, M.D.      1. UTI (lower urinary tract infection)   2. Headache       MDM   Pt with a general HA without sx suggestive of SAH(sudden onset, worst of life, or deficits), infection, or cavernous  vein thrombosis.  Normal neuro exam and vital signs.  Pt denies trauma and states HA is intermittent.  He was given tylenol for the headache.   Recently also c/o of feeling weak and lightheaded but denies vertigo.  No focal neuro deficits on exam.  States recently started lexapro 1 week ago and also has urinary complaints.   CT neg for acute findings.  Labs wnl except for evidence of UTI.  Pt was stared on keflex and culture sent.  Also due to recently starting lexapro that could also be the cause of sx and recommended taking half so 2.5 instead of 5 and f/u with PCP on Friday for recheck.          Gwyneth Sprout, MD 03/22/12 2356

## 2012-03-22 NOTE — ED Notes (Signed)
Lab called and new urine sample will be collected and sent to lab

## 2012-03-22 NOTE — ED Notes (Signed)
Pt reports headache x 1 week, weak, dizzy, and feeling bag. Vital sign stable, pt denied pain. Pt appeared fatigue, no diaphoresis noted. Pt alert, oriented. In no acute distress

## 2012-03-22 NOTE — ED Notes (Signed)
Pt alert and oriented x4. Respirations even and unlabored, bilateral symmetrical rise and fall of chest. Skin warm and dry. In no acute distress. Denies needs.   

## 2012-03-24 ENCOUNTER — Ambulatory Visit: Payer: MEDICARE | Admitting: Gastroenterology

## 2012-03-24 LAB — URINE CULTURE: Colony Count: 15000

## 2012-03-29 ENCOUNTER — Ambulatory Visit (INDEPENDENT_AMBULATORY_CARE_PROVIDER_SITE_OTHER): Payer: MEDICARE | Admitting: Internal Medicine

## 2012-03-29 ENCOUNTER — Telehealth: Payer: Self-pay | Admitting: Internal Medicine

## 2012-03-29 ENCOUNTER — Encounter: Payer: Self-pay | Admitting: Internal Medicine

## 2012-03-29 ENCOUNTER — Other Ambulatory Visit (INDEPENDENT_AMBULATORY_CARE_PROVIDER_SITE_OTHER): Payer: MEDICARE

## 2012-03-29 VITALS — BP 98/68 | HR 80 | Temp 97.4°F | Resp 16 | Wt 135.0 lb

## 2012-03-29 DIAGNOSIS — E538 Deficiency of other specified B group vitamins: Secondary | ICD-10-CM

## 2012-03-29 DIAGNOSIS — N390 Urinary tract infection, site not specified: Secondary | ICD-10-CM

## 2012-03-29 DIAGNOSIS — E119 Type 2 diabetes mellitus without complications: Secondary | ICD-10-CM

## 2012-03-29 DIAGNOSIS — F0390 Unspecified dementia without behavioral disturbance: Secondary | ICD-10-CM

## 2012-03-29 LAB — URINALYSIS, ROUTINE W REFLEX MICROSCOPIC
Bilirubin Urine: NEGATIVE
Ketones, ur: NEGATIVE
Leukocytes, UA: NEGATIVE
Urine Glucose: NEGATIVE
Urobilinogen, UA: 0.2 (ref 0.0–1.0)
pH: 6 (ref 5.0–8.0)

## 2012-03-29 LAB — GLUCOSE, POCT (MANUAL RESULT ENTRY): POC Glucose: 120 mg/dl — AB (ref 70–99)

## 2012-03-29 MED ORDER — CEPHALEXIN 500 MG PO CAPS: 500.0000 mg | ORAL_CAPSULE | Freq: Four times a day (QID) | ORAL | Status: DC

## 2012-03-29 MED ORDER — CYANOCOBALAMIN 1000 MCG/ML IJ SOLN
1000.0000 ug | Freq: Once | INTRAMUSCULAR | Status: AC
  Administered 2012-03-29: 1000 ug via INTRAMUSCULAR

## 2012-03-29 NOTE — Telephone Encounter (Signed)
[name], please, inform patient that his UA is better: take keflex for another week Thx

## 2012-03-29 NOTE — Assessment & Plan Note (Signed)
No change 

## 2012-03-29 NOTE — Assessment & Plan Note (Signed)
10/13 recent - UTI f/u of the ER visit

## 2012-03-29 NOTE — Assessment & Plan Note (Signed)
IM B12 was given

## 2012-03-29 NOTE — Progress Notes (Signed)
Subjective:    Patient ID: [name], male    DOB: 01-16-1925, 78 y.o.   MRN: [id number]  HPI  F/u ER visit for a UTI on 9/24 - he was put on Keflex 500 mg qid  The patient presents for a follow-up of  chronic hypertension, chronic dyslipidemia, type 2 diabetes better controlled, gait issues, LBP 8/10 at times, may be better with medicines - Lyrica prn; not using Sinemet, Duragesic. He started Remeron  - not taking  They saw a psychiatrist - given Seroquel - did not help - they stopped Zolpidem  He was paranoid again recently about wife having an affair etc. He is supposed to be on Namenda and risperidone, but is refusing to take them. He is taking Aricept    Wt Readings from Last 3 Encounters:  03/29/12 135 lb (61.236 kg)  02/10/12 134 lb (60.782 kg)  12/28/11 134 lb 12.8 oz (61.145 kg)   BP Readings from Last 3 Encounters:  03/29/12 98/68  03/22/12 138/74  02/10/12 130/70     Review of Systems  Constitutional: Positive for fatigue. Negative for appetite change and unexpected weight change.  HENT: Negative for nosebleeds, congestion, sneezing and trouble swallowing.   Eyes: Negative for itching and visual disturbance.  Cardiovascular: Negative for palpitations and leg swelling.  Gastrointestinal: Negative for diarrhea, blood in stool and abdominal distention.  Genitourinary: Negative for frequency and hematuria.  Musculoskeletal: Positive for gait problem. Negative for joint swelling.  Skin: Negative for rash.  Neurological: Negative for tremors and speech difficulty.  Psychiatric/Behavioral: Positive for decreased concentration. Negative for suicidal ideas, disturbed wake/sleep cycle, dysphoric mood and agitation. The patient is nervous/anxious.        Objective:   Physical Exam  Constitutional: He is oriented to person, place, and time. He appears well-developed. No distress.  HENT:  Mouth/Throat: Oropharynx is clear and moist.  Eyes: Conjunctivae normal are  normal. Pupils are equal, round, and reactive to light.  Neck: Normal range of motion. No JVD present. No thyromegaly present.  Cardiovascular: Normal rate, regular rhythm, normal heart sounds and intact distal pulses.  Exam reveals no gallop and no friction rub.   No murmur heard. Pulmonary/Chest: Effort normal and breath sounds normal. No respiratory distress. He has no wheezes. He has no rales. He exhibits no tenderness.  Abdominal: Soft. Bowel sounds are normal. He exhibits no distension and no mass. There is no tenderness. There is no rebound and no guarding.  Musculoskeletal: Normal range of motion. He exhibits no edema and no tenderness.  Lymphadenopathy:    He has no cervical adenopathy.  Neurological: He is alert and oriented to person, place, and time. He displays abnormal reflex. No cranial nerve deficit. He exhibits abnormal muscle tone. Coordination abnormal.       W/c Arms w/cogwheel rigidity  Skin: Skin is warm and dry. No rash noted.  Psychiatric: His behavior is normal. Judgment and thought content normal.       depressed    Not angry; calm, subdude. He is not hostile, paranoid    Lab Results  Component Value Date   WBC 9.4 03/22/2012   HGB 13.8 03/22/2012   HCT 40.0 03/22/2012   PLT 252 03/22/2012   GLUCOSE 148* 03/22/2012   CHOL 98 10/13/2010   TRIG 112.0 10/13/2010   HDL 33.60* 10/13/2010   LDLDIRECT 141.6 07/11/2007   LDLCALC 42 10/13/2010   ALT 12 11/17/2011   AST 17 11/17/2011   NA 137 03/22/2012   K  5.5* 03/22/2012   CL 100 03/22/2012   CREATININE 0.82 03/22/2012   BUN 16 03/22/2012   CO2 27 03/22/2012   TSH 1.25 06/03/2011   PSA 1.24 10/13/2010   INR 0.9 RATIO 07/27/2006   HGBA1C 5.7 06/03/2011   ER notes were reviewed      Assessment & Plan:

## 2012-03-30 NOTE — Telephone Encounter (Signed)
Pts wife informed.

## 2012-04-04 ENCOUNTER — Telehealth: Payer: Self-pay | Admitting: Internal Medicine

## 2012-04-04 NOTE — Telephone Encounter (Signed)
Caller: Faye/Spouse; Patient Name: [name]; PCP: Sonda Primes; Best Callback Phone Number: [phone]; Wife states takes generic Ambien 10mg  every night. Has not been helping for past week and is not sleeping. Has tried taking additional 1/2 tab but has not helped. Had appointment 10-7 with psychologist but "did not go because he did not feel like it". Was seen in ED "2 weeks ago" and diagnosed with UTI. Was seen in office 10-1 and infection not clearing so started another round of antibiotic. Is a 7 day cycle of antibiotic. States is not feeling better but relates this to not sleeping. States feels "like a zombie". Advised  appointment 10-8 but declines and states only wants medicine changed. Denies painful urination/frequent urination but did not have tis when diagnosed with UTI.  Wife has appointment 10-8 and states she will discuss with doctor there. PLEASE CALL PATIENT AND ADVISE IF MD WILL CHANGE SLEEPING MEDICINE.

## 2012-04-04 NOTE — Telephone Encounter (Signed)
Will discuss tomorrow Thx

## 2012-04-05 ENCOUNTER — Ambulatory Visit (INDEPENDENT_AMBULATORY_CARE_PROVIDER_SITE_OTHER): Payer: MEDICARE | Admitting: Gastroenterology

## 2012-04-05 ENCOUNTER — Encounter: Payer: Self-pay | Admitting: Gastroenterology

## 2012-04-05 VITALS — BP 110/64 | HR 60 | Ht 65.0 in | Wt 135.0 lb

## 2012-04-05 DIAGNOSIS — Z800 Family history of malignant neoplasm of digestive organs: Secondary | ICD-10-CM

## 2012-04-05 DIAGNOSIS — R634 Abnormal weight loss: Secondary | ICD-10-CM

## 2012-04-05 DIAGNOSIS — K5730 Diverticulosis of large intestine without perforation or abscess without bleeding: Secondary | ICD-10-CM

## 2012-04-05 DIAGNOSIS — Z86010 Personal history of colonic polyps: Secondary | ICD-10-CM

## 2012-04-05 MED ORDER — PEG-KCL-NACL-NASULF-NA ASC-C 100 G PO SOLR: 1.0000 | Freq: Once | ORAL | Status: DC

## 2012-04-05 NOTE — Progress Notes (Signed)
History of Present Illness: This is an 78 year old male here today with his wife. His brother recently died of colon cancer at 64. The patient has had a slow weight loss over the past 2 years of about 40 pounds. His appetite has decreased. His wife noted a darker, tarry stool on one occasion several weeks ago. The patient denies any change in stool color. His chart shows a history of adenomatous colon polyps in the past. He last underwent colonoscopy in November 2003 showing diverticulosis and external hemorrhoids. He ambulates with a walker at home. Denies weight loss, abdominal pain, constipation, diarrhea, change in stool caliber, melena, hematochezia, nausea, vomiting, dysphagia, reflux symptoms, chest pain.  Review of Systems: Pertinent positive and negative review of systems were noted in the above HPI section. All other review of systems were otherwise negative.  Current Medications, Allergies, Past Medical History, Past Surgical History, Family History and Social History were reviewed in [employer] record.  Physical Exam: General: Well developed , well nourished, elderly, frail, in a wheel chair, required assistance to get into an exam gown and on the exam today, no acute distress Head: Normocephalic and atraumatic Eyes:  sclerae anicteric, EOMI Ears: Normal auditory acuity Mouth: No deformity or lesions Neck: Supple, no masses or thyromegaly Lungs: Clear throughout to auscultation Heart: Regular rate and rhythm; no murmurs, rubs or bruits Abdomen: Soft, non tender and non distended. No masses, hepatosplenomegaly or hernias noted. Normal Bowel sounds Rectal: Small external anal tags, no internal lesions, Hemoccult negative soft, brown stool in the vault Musculoskeletal: Symmetrical with no gross deformities  Skin: No lesions on visible extremities Pulses:  Normal pulses noted Extremities: No clubbing, cyanosis, edema or deformities noted Neurological: Alert  oriented x 4, memory deficits, ambulates with assistance and with a walker Cervical Nodes:  No significant cervical adenopathy Inguinal Nodes: No significant inguinal adenopathy Psychological:  Alert and cooperative. Normal mood and affect  Assessment and Recommendations:  1.  Family history of colon cancer in his brother at age 74 and a personal history of adenomatous colon polyps. He has had a slow gradual weight loss which is likely on the basis of decreased oral intake. One episode of darker stool observed by his wife. He is Hemoccult-negative today and a recent CBC was normal. It could be difficult for him to tolerate the bowel prep but he states he should have no problem getting back and forth to the commode using his walker and cane. He is very interested in proceeding with colonoscopy. He is quite concerned about colon cancer given the weight loss and his brother's history. Schedule abdominal/pelvic CT to further evaluate weight loss. The risks, benefits, and alternatives to colonoscopy with possible biopsy and possible polypectomy were discussed with the patient and they consent to proceed.

## 2012-04-05 NOTE — Patient Instructions (Addendum)
You have been scheduled for a colonoscopy with propofol. Please follow written instructions given to you at your visit today.  Please pick up your prep kit at the pharmacy within the next 1-3 days. If you use inhalers (even only as needed), please bring them with you on the day of your procedure.  You have been scheduled for a CT scan of the abdomen and pelvis at [facility] ([address]---this is in the same building as [facility]).   You are scheduled on 04/06/12 at 2:30pm. You should arrive 15 minutes prior to your appointment time for registration. Please follow the written instructions below on the day of your exam:  WARNING: IF YOU ARE ALLERGIC TO IODINE/X-RAY DYE, PLEASE NOTIFY RADIOLOGY IMMEDIATELY AT [phone]! YOU WILL BE GIVEN A 13 HOUR PREMEDICATION PREP.  1) Do not eat or drink anything after 10:30am (4 hours prior to your test) 2) You have been given 2 bottles of oral contrast to drink. The solution may taste better if refrigerated, but do NOT add ice or any other liquid to this solution. Shake well before drinking.    Drink 1 bottle of contrast @ 12:30pm (2 hours prior to your exam)  Drink 1 bottle of contrast @ 1:30pm (1 hour prior to your exam)  You may take any medications as prescribed with a small amount of water except for the following: Metformin, Glucophage, Glucovance, Avandamet, Riomet, Fortamet, Actoplus Met, Janumet, Glumetza or Metaglip. The above medications must be held the day of the exam AND 48 hours after the exam.  The purpose of you drinking the oral contrast is to aid in the visualization of your intestinal tract. The contrast solution may cause some diarrhea. Before your exam is started, you will be given a small amount of fluid to drink. Depending on your individual set of symptoms, you may also receive an intravenous injection of x-ray contrast/dye. Plan on being at [facility] for 30 minutes or long, depending on the type of  exam you are having performed.  If you have any questions regarding your exam or if you need to reschedule, you may call the CT department at [phone] between the hours of 8:00 am and 5:00 pm, Monday-Friday.  ________________________________________________________________________

## 2012-04-06 ENCOUNTER — Ambulatory Visit (INDEPENDENT_AMBULATORY_CARE_PROVIDER_SITE_OTHER)
Admission: RE | Admit: 2012-04-06 | Discharge: 2012-04-06 | Disposition: A | Discharge status: Home / Self Care | Payer: BC Managed Care – PPO | Source: Ambulatory Visit | Attending: Gastroenterology | Admitting: Gastroenterology

## 2012-04-06 DIAGNOSIS — R634 Abnormal weight loss: Secondary | ICD-10-CM

## 2012-04-06 MED ORDER — IOHEXOL 300 MG/ML  SOLN
100.0000 mL | Freq: Once | INTRAMUSCULAR | Status: AC | PRN
  Administered 2012-04-06: 100 mL via INTRAVENOUS

## 2012-04-12 ENCOUNTER — Encounter: Payer: Self-pay | Admitting: Gastroenterology

## 2012-04-12 ENCOUNTER — Ambulatory Visit (AMBULATORY_SURGERY_CENTER): Payer: BC Managed Care – PPO | Admitting: Gastroenterology

## 2012-04-12 VITALS — BP 150/75 | HR 46 | Temp 97.1°F | Resp 17 | Ht 65.0 in | Wt 135.0 lb

## 2012-04-12 DIAGNOSIS — Z800 Family history of malignant neoplasm of digestive organs: Secondary | ICD-10-CM

## 2012-04-12 DIAGNOSIS — K5289 Other specified noninfective gastroenteritis and colitis: Secondary | ICD-10-CM

## 2012-04-12 DIAGNOSIS — R634 Abnormal weight loss: Secondary | ICD-10-CM

## 2012-04-12 LAB — GLUCOSE, CAPILLARY
Glucose-Capillary: 97 mg/dL (ref 70–99)
Glucose-Capillary: 92 mg/dL (ref 70–99)

## 2012-04-12 MED ORDER — SODIUM CHLORIDE 0.9 % IV SOLN: 500.0000 mL | INTRAVENOUS | Status: DC

## 2012-04-12 NOTE — Op Note (Signed)
[facility] [address] [place], [address]   COLONOSCOPY PROCEDURE REPORT  PATIENT: [name], [name]  MR#: [id number] BIRTHDATE: [birth year] , 78  yrs. old GENDER: Male ENDOSCOPIST: Meryl Dare, MD, [facility]  PROCEDURE DATE:  04/12/2012 PROCEDURE:   Colonoscopy with biopsy ASA CLASS:   Class III INDICATIONS: weight loss and patient's immediate family history of colon cancer. MEDICATIONS: MAC sedation, administered by CRNA and propofol (Diprivan) 80mg  IV DESCRIPTION OF PROCEDURE:   After the risks benefits and alternatives of the procedure were thoroughly explained, informed consent was obtained.  A digital rectal exam revealed no abnormalities of the rectum.   The LB [id number]  endoscope was introduced through the anus and advanced to the terminal ileum which was intubated for a short distance. No adverse events experienced.   The quality of the prep was excellent, using MoviPrep  The instrument was then slowly withdrawn as the colon was fully examined.  COLON FINDINGS: Small nodules were found in the distal terminal ileum.  Multiple biopsies of the lesions were performed.   Moderate diverticulosis was noted in the transverse colon, descending colon, and sigmoid colon.   The colon was otherwise normal.  There was no diverticulosis, inflammation, polyps or cancers unless previously stated.  Retroflexed views revealed moderate internal hemorrhoids. The time to cecum=4 minutes 26 seconds.  Withdrawal time=10 minutes 54 seconds.  The scope was withdrawn and the procedure completed.  COMPLICATIONS: There were no complications.  ENDOSCOPIC IMPRESSION: 1.   Small nodules in the terminal ileum; multiple biopsies 2.   Moderate diverticulosis was noted in the transverse colon, descending colon, and sigmoid colon 3.   Moderate internal hemorrhoids  RECOMMENDATIONS: 1.  Await pathology results 2.  High fiber diet with liberal fluid intake. 3.  Given  your age, you will not need another colonoscopy for colon cancer screening or polyp surveillance.  These types of tests usually stop around the age 53.  eSigned:  Meryl Dare, MD, [facility] 04/12/2012 2:10 PM

## 2012-04-12 NOTE — Progress Notes (Signed)
Patient did not experience any of the following events: a burn prior to discharge; a fall within the facility; wrong site/side/patient/procedure/implant event; or a hospital transfer or hospital admission upon discharge from the facility. (G8907) Patient did not have preoperative order for IV antibiotic SSI prophylaxis. (G8918)  

## 2012-04-12 NOTE — Progress Notes (Signed)
Propofol per m smith crna, all meds titrated per crna. See scanned intra procedure report. ewm

## 2012-04-12 NOTE — Patient Instructions (Addendum)
YOU HAD AN ENDOSCOPIC PROCEDURE TODAY AT THE [facility]: Refer to the procedure report that was given to you for any specific questions about what was found during the examination.  If the procedure report does not answer your questions, please call your gastroenterologist to clarify.  If you requested that your care partner not be given the details of your procedure findings, then the procedure report has been included in a sealed envelope for you to review at your convenience later.  YOU SHOULD EXPECT: Some feelings of bloating in the abdomen. Passage of more gas than usual.  Walking can help get rid of the air that was put into your GI tract during the procedure and reduce the bloating. If you had a lower endoscopy (such as a colonoscopy or flexible sigmoidoscopy) you may notice spotting of blood in your stool or on the toilet paper. If you underwent a bowel prep for your procedure, then you may not have a normal bowel movement for a few days.  DIET: Your first meal following the procedure should be a light meal and then it is ok to progress to your normal diet.  A half-sandwich or bowl of soup is an example of a good first meal.  Heavy or fried foods are harder to digest and may make you feel nauseous or bloated.  Likewise meals heavy in dairy and vegetables can cause extra gas to form and this can also increase the bloating.  Drink plenty of fluids but you should avoid alcoholic beverages for 24 hours.  ACTIVITY: Your care partner should take you home directly after the procedure.  You should plan to take it easy, moving slowly for the rest of the day.  You can resume normal activity the day after the procedure however you should NOT DRIVE or use heavy machinery for 24 hours (because of the sedation medicines used during the test).    SYMPTOMS TO REPORT IMMEDIATELY: A gastroenterologist can be reached at any hour.  During normal business hours, 8:30 AM to 5:00 PM Monday through Friday,  call [phone].  After hours and on weekends, please call the GI answering service at [phone] who will take a message and have the physician on call contact you.   Following lower endoscopy (colonoscopy or flexible sigmoidoscopy):  Excessive amounts of blood in the stool  Significant tenderness or worsening of abdominal pains  Swelling of the abdomen that is new, acute  Fever of 100F or higher  FOLLOW UP: If any biopsies were taken you will be contacted by phone or by letter within the next 1-3 weeks.  Call your gastroenterologist if you have not heard about the biopsies in 3 weeks.  Our staff will call the home number listed on your records the next business day following your procedure to check on you and address any questions or concerns that you may have at that time regarding the information given to you following your procedure. This is a courtesy call and so if there is no answer at the home number and we have not heard from you through the emergency physician on call, we will assume that you have returned to your regular daily activities without incident.  SIGNATURES/CONFIDENTIALITY: You and/or your care partner have signed paperwork which will be entered into your electronic medical record.  These signatures attest to the fact that that the information above on your After Visit Summary has been reviewed and is understood.  Full responsibility of the confidentiality of this   discharge information lies with you and/or your care-partner.   Resume medications. Information given on hemorrhoids and high fiber diet with discharge instructions. 

## 2012-04-13 ENCOUNTER — Telehealth: Payer: Self-pay | Admitting: *Deleted

## 2012-04-13 NOTE — Telephone Encounter (Signed)
  Follow up Call-  Call back number 04/12/2012  Post procedure Call Back phone  # [phone]  Permission to leave phone message Yes     Patient questions:  Do you have a fever, pain , or abdominal swelling? no Pain Score  0 *  Have you tolerated food without any problems? yes  Have you been able to return to your normal activities? yes  Do you have any questions about your discharge instructions: Diet   no Medications  no Follow up visit  no  Do you have questions or concerns about your Care? no  Actions: * If pain score is 4 or above: No action needed, pain <4.

## 2012-04-18 ENCOUNTER — Encounter: Payer: Self-pay | Admitting: Gastroenterology

## 2012-04-20 ENCOUNTER — Ambulatory Visit (INDEPENDENT_AMBULATORY_CARE_PROVIDER_SITE_OTHER): Payer: BC Managed Care – PPO | Admitting: Internal Medicine

## 2012-04-20 ENCOUNTER — Encounter: Payer: Self-pay | Admitting: Internal Medicine

## 2012-04-20 VITALS — BP 130/64 | HR 67 | Temp 97.8°F | Ht 64.0 in | Wt 133.0 lb

## 2012-04-20 DIAGNOSIS — E119 Type 2 diabetes mellitus without complications: Secondary | ICD-10-CM

## 2012-04-20 DIAGNOSIS — F329 Major depressive disorder, single episode, unspecified: Secondary | ICD-10-CM

## 2012-04-20 DIAGNOSIS — M545 Low back pain: Secondary | ICD-10-CM

## 2012-04-20 DIAGNOSIS — Z23 Encounter for immunization: Secondary | ICD-10-CM

## 2012-04-20 DIAGNOSIS — R269 Unspecified abnormalities of gait and mobility: Secondary | ICD-10-CM

## 2012-04-20 DIAGNOSIS — E538 Deficiency of other specified B group vitamins: Secondary | ICD-10-CM

## 2012-04-20 DIAGNOSIS — E785 Hyperlipidemia, unspecified: Secondary | ICD-10-CM

## 2012-04-20 DIAGNOSIS — F411 Generalized anxiety disorder: Secondary | ICD-10-CM

## 2012-04-20 DIAGNOSIS — I10 Essential (primary) hypertension: Secondary | ICD-10-CM

## 2012-04-20 DIAGNOSIS — F22 Delusional disorders: Secondary | ICD-10-CM

## 2012-04-20 DIAGNOSIS — F0390 Unspecified dementia without behavioral disturbance: Secondary | ICD-10-CM

## 2012-04-20 DIAGNOSIS — K219 Gastro-esophageal reflux disease without esophagitis: Secondary | ICD-10-CM

## 2012-04-20 DIAGNOSIS — R413 Other amnesia: Secondary | ICD-10-CM

## 2012-04-20 MED ORDER — IPRATROPIUM BROMIDE 0.06 % NA SOLN: 2.0000 | Freq: Three times a day (TID) | NASAL | Status: DC | PRN

## 2012-04-20 MED ORDER — CLONAZEPAM 0.5 MG PO TABS: 0.5000 mg | ORAL_TABLET | Freq: Three times a day (TID) | ORAL | Status: DC | PRN

## 2012-04-20 MED ORDER — AMLODIPINE BESYLATE 10 MG PO TABS: 10.0000 mg | ORAL_TABLET | Freq: Every day | ORAL | Status: DC

## 2012-04-20 MED ORDER — CYANOCOBALAMIN 1000 MCG/ML IJ SOLN
1000.0000 ug | Freq: Once | INTRAMUSCULAR | Status: AC
  Administered 2012-04-20: 1000 ug via INTRAMUSCULAR

## 2012-04-20 MED ORDER — GLIPIZIDE ER 10 MG PO TB24: 10.0000 mg | ORAL_TABLET | Freq: Every day | ORAL | Status: DC

## 2012-04-20 MED ORDER — POTASSIUM CHLORIDE CRYS ER 10 MEQ PO TBCR: 10.0000 meq | EXTENDED_RELEASE_TABLET | Freq: Two times a day (BID) | ORAL | Status: DC

## 2012-04-20 NOTE — Assessment & Plan Note (Signed)
Multifactorial. In a w/c now

## 2012-04-20 NOTE — Progress Notes (Signed)
Subjective:     HPI   The patient presents for a follow-up of  chronic hypertension, chronic dyslipidemia, type 2 diabetes better controlled, gait issues, LBP 8/10 at times, may be better with medicines - Lyrica prn; not using Sinemet, Duragesic. He started Remeron  - not taking  They saw a psychiatrist - given Seroquel - did not help - they stopped Zolpidem  He was paranoid again recently about wife having an affair etc. He is supposed to be on Namenda and risperidone, but is refusing to take them. He is taking Aricept    Wt Readings from Last 3 Encounters:  04/20/12 133 lb (60.328 kg)  04/12/12 135 lb (61.236 kg)  04/05/12 135 lb (61.236 kg)   BP Readings from Last 3 Encounters:  04/20/12 130/64  04/12/12 150/75  04/05/12 110/64     Review of Systems  Constitutional: Positive for fatigue. Negative for appetite change and unexpected weight change.  HENT: Negative for nosebleeds, congestion, sneezing and trouble swallowing.   Eyes: Negative for itching and visual disturbance.  Cardiovascular: Negative for palpitations and leg swelling.  Gastrointestinal: Negative for diarrhea, blood in stool and abdominal distention.  Genitourinary: Negative for frequency and hematuria.  Musculoskeletal: Positive for gait problem. Negative for joint swelling.  Skin: Negative for rash.  Neurological: Negative for tremors and speech difficulty.  Psychiatric/Behavioral: Positive for decreased concentration. Negative for suicidal ideas, disturbed wake/sleep cycle, dysphoric mood and agitation. The patient is nervous/anxious.        Objective:   Physical Exam  Constitutional: He is oriented to person, place, and time. He appears well-developed. No distress.  HENT:  Mouth/Throat: Oropharynx is clear and moist.  Eyes: Conjunctivae normal are normal. Pupils are equal, round, and reactive to light.  Neck: Normal range of motion. No JVD present. No thyromegaly present.  Cardiovascular: Normal  rate, regular rhythm, normal heart sounds and intact distal pulses.  Exam reveals no gallop and no friction rub.   No murmur heard. Pulmonary/Chest: Effort normal and breath sounds normal. No respiratory distress. He has no wheezes. He has no rales. He exhibits no tenderness.  Abdominal: Soft. Bowel sounds are normal. He exhibits no distension and no mass. There is no tenderness. There is no rebound and no guarding.  Musculoskeletal: Normal range of motion. He exhibits no edema and no tenderness.  Lymphadenopathy:    He has no cervical adenopathy.  Neurological: He is alert and oriented to person, place, and time. He displays abnormal reflex. No cranial nerve deficit. He exhibits abnormal muscle tone. Coordination abnormal.       W/c Arms w/cogwheel rigidity  Skin: Skin is warm and dry. No rash noted.  Psychiatric: His behavior is normal. Judgment and thought content normal.       depressed    Not angry; calm, subdude. He is not hostile, paranoid    Lab Results  Component Value Date   WBC 9.4 03/22/2012   HGB 13.8 03/22/2012   HCT 40.0 03/22/2012   PLT 252 03/22/2012   GLUCOSE 148* 03/22/2012   CHOL 98 10/13/2010   TRIG 112.0 10/13/2010   HDL 33.60* 10/13/2010   LDLDIRECT 141.6 07/11/2007   LDLCALC 42 10/13/2010   ALT 12 11/17/2011   AST 17 11/17/2011   NA 137 03/22/2012   K 5.5* 03/22/2012   CL 100 03/22/2012   CREATININE 0.82 03/22/2012   BUN 16 03/22/2012   CO2 27 03/22/2012   TSH 1.25 06/03/2011   PSA 1.24 10/13/2010   INR  0.9 RATIO 07/27/2006   HGBA1C 5.7 06/03/2011    Inpatient notes were reviewed      Assessment & Plan:

## 2012-04-20 NOTE — Addendum Note (Signed)
Addended by: Merrilyn Puma on: 04/20/2012 04:42 PM   Modules accepted: Orders

## 2012-04-20 NOTE — Assessment & Plan Note (Signed)
Progressing dementia 2011

## 2012-04-20 NOTE — Assessment & Plan Note (Signed)
Doing ok.

## 2012-04-20 NOTE — Assessment & Plan Note (Signed)
He stopped lexapro

## 2012-04-20 NOTE — Assessment & Plan Note (Signed)
He refused meds Stopped going to councellng

## 2012-04-20 NOTE — Assessment & Plan Note (Signed)
Continue with current prescription therapy as reflected on the Med list.  

## 2012-04-20 NOTE — Assessment & Plan Note (Signed)
Not taking Rx 

## 2012-04-20 NOTE — Assessment & Plan Note (Signed)
Continue with current prescription therapy as reflected on the Med list. BP Readings from Last 3 Encounters:  04/20/12 130/64  04/12/12 150/75  04/05/12 110/64

## 2012-04-22 ENCOUNTER — Other Ambulatory Visit: Payer: Self-pay

## 2012-05-05 ENCOUNTER — Encounter: Payer: BC Managed Care – PPO | Admitting: Gastroenterology

## 2012-06-15 ENCOUNTER — Other Ambulatory Visit: Payer: Self-pay | Admitting: *Deleted

## 2012-06-15 ENCOUNTER — Other Ambulatory Visit (INDEPENDENT_AMBULATORY_CARE_PROVIDER_SITE_OTHER): Payer: BC Managed Care – PPO

## 2012-06-15 DIAGNOSIS — E538 Deficiency of other specified B group vitamins: Secondary | ICD-10-CM

## 2012-06-15 DIAGNOSIS — I10 Essential (primary) hypertension: Secondary | ICD-10-CM

## 2012-06-15 DIAGNOSIS — E119 Type 2 diabetes mellitus without complications: Secondary | ICD-10-CM

## 2012-06-15 DIAGNOSIS — E785 Hyperlipidemia, unspecified: Secondary | ICD-10-CM

## 2012-06-15 LAB — CBC WITH DIFFERENTIAL/PLATELET
Basophils Absolute: 0 10*3/uL (ref 0.0–0.1)
Basophils Relative: 0.4 % (ref 0.0–3.0)
Eosinophils Relative: 0.7 % (ref 0.0–5.0)
Hemoglobin: 12.7 g/dL — ABNORMAL LOW (ref 13.0–17.0)
Lymphocytes Relative: 37.5 % (ref 12.0–46.0)
Monocytes Relative: 11 % (ref 3.0–12.0)
Neutro Abs: 3.1 10*3/uL (ref 1.4–7.7)
RBC: 3.74 Mil/uL — ABNORMAL LOW (ref 4.22–5.81)
WBC: 6.1 10*3/uL (ref 4.5–10.5)

## 2012-06-15 LAB — COMPREHENSIVE METABOLIC PANEL
AST: 16 U/L (ref 0–37)
BUN: 20 mg/dL (ref 6–23)
Calcium: 9.4 mg/dL (ref 8.4–10.5)
Chloride: 106 mEq/L (ref 96–112)
Creatinine, Ser: 1 mg/dL (ref 0.4–1.5)

## 2012-06-15 LAB — LIPID PANEL
HDL: 47.2 mg/dL (ref >39.00)
Total CHOL/HDL Ratio: 2
VLDL: 10.8 mg/dL (ref 0.0–40.0)

## 2012-06-15 LAB — HEMOGLOBIN A1C: Hgb A1c MFr Bld: 5.8 % (ref 4.6–6.5)

## 2012-06-15 NOTE — Addendum Note (Signed)
Addended by: Merrilyn Puma on: 06/15/2012 09:11 AM   Modules accepted: Orders

## 2012-06-20 ENCOUNTER — Encounter: Payer: Self-pay | Admitting: Internal Medicine

## 2012-06-20 ENCOUNTER — Ambulatory Visit (INDEPENDENT_AMBULATORY_CARE_PROVIDER_SITE_OTHER): Payer: BC Managed Care – PPO | Admitting: Internal Medicine

## 2012-06-20 VITALS — BP 130/58 | HR 68 | Temp 98.3°F | Resp 16 | Wt 139.0 lb

## 2012-06-20 DIAGNOSIS — R413 Other amnesia: Secondary | ICD-10-CM

## 2012-06-20 DIAGNOSIS — H01009 Unspecified blepharitis unspecified eye, unspecified eyelid: Secondary | ICD-10-CM

## 2012-06-20 DIAGNOSIS — F411 Generalized anxiety disorder: Secondary | ICD-10-CM

## 2012-06-20 DIAGNOSIS — E119 Type 2 diabetes mellitus without complications: Secondary | ICD-10-CM

## 2012-06-20 DIAGNOSIS — H01006 Unspecified blepharitis left eye, unspecified eyelid: Secondary | ICD-10-CM

## 2012-06-20 DIAGNOSIS — H01003 Unspecified blepharitis right eye, unspecified eyelid: Secondary | ICD-10-CM | POA: Insufficient documentation

## 2012-06-20 DIAGNOSIS — F22 Delusional disorders: Secondary | ICD-10-CM

## 2012-06-20 DIAGNOSIS — E538 Deficiency of other specified B group vitamins: Secondary | ICD-10-CM

## 2012-06-20 LAB — GLUCOSE, POCT (MANUAL RESULT ENTRY): POC Glucose: 122 mg/dl — AB (ref 70–99)

## 2012-06-20 MED ORDER — GLUCOSE BLOOD VI STRP: ORAL_STRIP | Status: DC

## 2012-06-20 MED ORDER — CYANOCOBALAMIN 1000 MCG/ML IJ SOLN: 1000.0000 ug | INTRAMUSCULAR | Status: DC

## 2012-06-20 MED ORDER — CYANOCOBALAMIN 1000 MCG/ML IJ SOLN
1000.0000 ug | Freq: Once | INTRAMUSCULAR | Status: AC
  Administered 2012-06-20: 1000 ug via INTRAMUSCULAR

## 2012-06-20 MED ORDER — ZOLPIDEM TARTRATE 10 MG PO TABS: 10.0000 mg | ORAL_TABLET | Freq: Every day | ORAL | Status: DC

## 2012-06-20 MED ORDER — ATENOLOL 50 MG PO TABS: 25.0000 mg | ORAL_TABLET | Freq: Two times a day (BID) | ORAL | Status: DC

## 2012-06-20 MED ORDER — ERYTHROMYCIN 5 MG/GM OP OINT: TOPICAL_OINTMENT | Freq: Every day | OPHTHALMIC | Status: DC

## 2012-06-20 NOTE — Progress Notes (Signed)
Subjective:     HPI   C/o eye lid irritation in B eyes x long time  The patient presents for a follow-up of  chronic hypertension, chronic dyslipidemia, type 2 diabetes better controlled, gait issues, LBP 8/10 at times, may be better with medicines - Lyrica prn; not using Sinemet, Duragesic. He started Remeron  - not taking  They saw a psychiatrist - given Seroquel - did not help - they stopped Zolpidem  He was paranoid again recently about wife having an affair etc. He is supposed to be on Namenda and risperidone, but is refusing to take them. He is taking Aricept    Wt Readings from Last 3 Encounters:  06/20/12 139 lb (63.05 kg)  04/20/12 133 lb (60.328 kg)  04/12/12 135 lb (61.236 kg)   BP Readings from Last 3 Encounters:  06/20/12 130/58  04/20/12 130/64  04/12/12 150/75     Review of Systems  Constitutional: Positive for fatigue. Negative for appetite change and unexpected weight change.  HENT: Negative for nosebleeds, congestion, sneezing and trouble swallowing.   Eyes: Negative for itching and visual disturbance.  Cardiovascular: Negative for palpitations and leg swelling.  Gastrointestinal: Negative for diarrhea, blood in stool and abdominal distention.  Genitourinary: Negative for frequency and hematuria.  Musculoskeletal: Positive for gait problem. Negative for joint swelling.  Skin: Negative for rash.  Neurological: Negative for tremors and speech difficulty.  Psychiatric/Behavioral: Positive for decreased concentration. Negative for suicidal ideas, sleep disturbance, dysphoric mood and agitation. The patient is nervous/anxious.        Objective:   Physical Exam  Constitutional: He is oriented to person, place, and time. He appears well-developed. No distress.  HENT:  Mouth/Throat: Oropharynx is clear and moist.  Eyes: Conjunctivae normal are normal. Pupils are equal, round, and reactive to light.  Neck: Normal range of motion. No JVD present. No  thyromegaly present.  Cardiovascular: Normal rate, regular rhythm, normal heart sounds and intact distal pulses.  Exam reveals no gallop and no friction rub.   No murmur heard. Pulmonary/Chest: Effort normal and breath sounds normal. No respiratory distress. He has no wheezes. He has no rales. He exhibits no tenderness.  Abdominal: Soft. Bowel sounds are normal. He exhibits no distension and no mass. There is no tenderness. There is no rebound and no guarding.  Musculoskeletal: Normal range of motion. He exhibits no edema and no tenderness.  Lymphadenopathy:    He has no cervical adenopathy.  Neurological: He is alert and oriented to person, place, and time. He displays abnormal reflex. No cranial nerve deficit. He exhibits abnormal muscle tone. Coordination abnormal.       W/c Arms w/cogwheel rigidity  Skin: Skin is warm and dry. No rash noted.  Psychiatric: His behavior is normal. Judgment and thought content normal.       depressed  B blepharitis  Not angry; calm, subdude. He is not hostile, paranoid    Lab Results  Component Value Date   WBC 6.1 06/15/2012   HGB 12.7* 06/15/2012   HCT 38.1* 06/15/2012   PLT 227.0 06/15/2012   GLUCOSE 86 06/15/2012   CHOL 102 06/15/2012   TRIG 54.0 06/15/2012   HDL 47.20 06/15/2012   LDLDIRECT 141.6 07/11/2007   LDLCALC 44 06/15/2012   ALT 16 06/15/2012   AST 16 06/15/2012   NA 140 06/15/2012   K 3.8 06/15/2012   CL 106 06/15/2012   CREATININE 1.0 06/15/2012   BUN 20 06/15/2012   CO2 29 06/15/2012  TSH 1.25 06/03/2011   PSA 1.24 10/13/2010   INR 0.9 RATIO 07/27/2006   HGBA1C 5.8 06/15/2012          Assessment & Plan:

## 2012-06-20 NOTE — Assessment & Plan Note (Signed)
Continue with current prescription therapy as reflected on the Med list.  

## 2012-06-20 NOTE — Assessment & Plan Note (Signed)
Risks associated with treatment noncompliance were discussed. Compliance was encouraged. 

## 2012-06-20 NOTE — Assessment & Plan Note (Signed)
erythro oint 

## 2012-06-30 ENCOUNTER — Telehealth: Payer: Self-pay | Admitting: Internal Medicine

## 2012-06-30 NOTE — Telephone Encounter (Signed)
?   15 mg Zolpidem tabs. 15 mg abs are n/a.

## 2012-07-25 ENCOUNTER — Telehealth: Payer: Self-pay | Admitting: *Deleted

## 2012-07-25 NOTE — Telephone Encounter (Signed)
Rf req for Clonazepam 0.5 mg tid prn. # 90. Ok to Rf?

## 2012-07-25 NOTE — Telephone Encounter (Signed)
OK to fill this prescription with additional refills x5 Thank you!  

## 2012-07-26 MED ORDER — CLONAZEPAM 0.5 MG PO TABS: 0.5000 mg | ORAL_TABLET | Freq: Three times a day (TID) | ORAL | Status: DC | PRN

## 2012-07-26 NOTE — Telephone Encounter (Signed)
Done

## 2012-07-27 ENCOUNTER — Telehealth: Payer: Self-pay | Admitting: Internal Medicine

## 2012-07-27 NOTE — Telephone Encounter (Signed)
Patient is due to have eye surgery tomorrow and they need an OK from Dr. Posey Rea that it is OK for the patient to stop his asprin  FAX [phone], please call wife if this is possible. Per wife he has already stopped the asprin but the doctor needs an OK from his PCP

## 2012-07-27 NOTE — Telephone Encounter (Signed)
OK to have eye surgery Ok to stop ASA as directed by his eye sergeon Thx

## 2012-07-27 NOTE — Telephone Encounter (Signed)
Called Denise at eye center in [facility]. and Faxed clearance note for Entropion repair and ok to hold ASA 3-5 days prior to [place] at [phone].

## 2012-09-06 ENCOUNTER — Ambulatory Visit: Payer: MEDICARE | Attending: Neurology | Admitting: Physical Therapy

## 2012-09-06 DIAGNOSIS — R269 Unspecified abnormalities of gait and mobility: Secondary | ICD-10-CM | POA: Insufficient documentation

## 2012-09-06 DIAGNOSIS — IMO0001 Reserved for inherently not codable concepts without codable children: Secondary | ICD-10-CM | POA: Insufficient documentation

## 2012-09-12 ENCOUNTER — Ambulatory Visit: Payer: MEDICARE | Admitting: Physical Therapy

## 2012-09-14 ENCOUNTER — Ambulatory Visit: Payer: MEDICARE | Admitting: Physical Therapy

## 2012-09-16 ENCOUNTER — Other Ambulatory Visit (INDEPENDENT_AMBULATORY_CARE_PROVIDER_SITE_OTHER): Payer: Medicare Other

## 2012-09-16 DIAGNOSIS — F22 Delusional disorders: Secondary | ICD-10-CM

## 2012-09-16 DIAGNOSIS — F411 Generalized anxiety disorder: Secondary | ICD-10-CM

## 2012-09-16 DIAGNOSIS — E538 Deficiency of other specified B group vitamins: Secondary | ICD-10-CM

## 2012-09-16 DIAGNOSIS — H01009 Unspecified blepharitis unspecified eye, unspecified eyelid: Secondary | ICD-10-CM

## 2012-09-16 DIAGNOSIS — H01003 Unspecified blepharitis right eye, unspecified eyelid: Secondary | ICD-10-CM

## 2012-09-16 DIAGNOSIS — R413 Other amnesia: Secondary | ICD-10-CM

## 2012-09-16 LAB — BASIC METABOLIC PANEL
Calcium: 9.5 mg/dL (ref 8.4–10.5)
GFR: 77.09 mL/min (ref >60.00)
Glucose, Bld: 87 mg/dL (ref 70–99)
Potassium: 3.2 mEq/L — ABNORMAL LOW (ref 3.5–5.1)
Sodium: 140 mEq/L (ref 135–145)

## 2012-09-19 ENCOUNTER — Ambulatory Visit: Payer: MEDICARE | Admitting: Physical Therapy

## 2012-09-20 ENCOUNTER — Encounter: Payer: Self-pay | Admitting: Internal Medicine

## 2012-09-20 ENCOUNTER — Ambulatory Visit (INDEPENDENT_AMBULATORY_CARE_PROVIDER_SITE_OTHER): Payer: Medicare Other | Admitting: Internal Medicine

## 2012-09-20 VITALS — BP 130/70 | Temp 98.9°F | Wt 136.0 lb

## 2012-09-20 DIAGNOSIS — F0390 Unspecified dementia without behavioral disturbance: Secondary | ICD-10-CM

## 2012-09-20 DIAGNOSIS — F411 Generalized anxiety disorder: Secondary | ICD-10-CM

## 2012-09-20 DIAGNOSIS — R7309 Other abnormal glucose: Secondary | ICD-10-CM

## 2012-09-20 DIAGNOSIS — E538 Deficiency of other specified B group vitamins: Secondary | ICD-10-CM

## 2012-09-20 DIAGNOSIS — R739 Hyperglycemia, unspecified: Secondary | ICD-10-CM

## 2012-09-20 DIAGNOSIS — J069 Acute upper respiratory infection, unspecified: Secondary | ICD-10-CM

## 2012-09-20 LAB — GLUCOSE, POCT (MANUAL RESULT ENTRY): POC Glucose: 159 mg/dl — AB (ref 70–99)

## 2012-09-20 MED ORDER — AZITHROMYCIN 250 MG PO TABS: ORAL_TABLET | ORAL | Status: DC

## 2012-09-20 MED ORDER — CYANOCOBALAMIN 1000 MCG/ML IJ SOLN
1000.0000 ug | Freq: Once | INTRAMUSCULAR | Status: AC
  Administered 2012-09-20: 1000 ug via INTRAMUSCULAR

## 2012-09-20 MED ORDER — SIMVASTATIN 40 MG PO TABS: 40.0000 mg | ORAL_TABLET | Freq: Every day | ORAL | Status: DC

## 2012-09-20 MED ORDER — CLONAZEPAM 0.5 MG PO TABS: 0.5000 mg | ORAL_TABLET | Freq: Three times a day (TID) | ORAL | Status: DC | PRN

## 2012-09-20 MED ORDER — POTASSIUM CHLORIDE CRYS ER 10 MEQ PO TBCR: 10.0000 meq | EXTENDED_RELEASE_TABLET | Freq: Two times a day (BID) | ORAL | Status: DC

## 2012-09-20 NOTE — Assessment & Plan Note (Signed)
Continue with current prescription therapy as reflected on the Med list.  

## 2012-09-20 NOTE — Assessment & Plan Note (Signed)
Zpac 

## 2012-09-20 NOTE — Progress Notes (Signed)
Subjective:     HPI  C/o URI sx's x 3-4 d, cough and fever C/o eye lid irritation in B eyes x long time  The patient presents for a follow-up of  chronic hypertension, chronic dyslipidemia, type 2 diabetes better controlled, gait issues, LBP 8/10 at times, may be better with medicines - Lyrica prn; not using Sinemet, Duragesic. He started Remeron  - not taking  They saw a psychiatrist - given Seroquel - did not help - they stopped Zolpidem  He was paranoid again recently about wife having an affair etc. He is supposed to be on Namenda and risperidone, but is refusing to take them. He is taking Aricept    Wt Readings from Last 3 Encounters:  09/20/12 136 lb (61.689 kg)  06/20/12 139 lb (63.05 kg)  04/20/12 133 lb (60.328 kg)   BP Readings from Last 3 Encounters:  09/20/12 130/70  06/20/12 130/58  04/20/12 130/64     Review of Systems  Constitutional: Positive for fatigue. Negative for appetite change and unexpected weight change.  HENT: Negative for nosebleeds, congestion, sneezing and trouble swallowing.   Eyes: Negative for itching and visual disturbance.  Cardiovascular: Negative for palpitations and leg swelling.  Gastrointestinal: Negative for diarrhea, blood in stool and abdominal distention.  Genitourinary: Negative for frequency and hematuria.  Musculoskeletal: Positive for gait problem. Negative for joint swelling.  Skin: Negative for rash.  Neurological: Negative for tremors and speech difficulty.  Psychiatric/Behavioral: Positive for decreased concentration. Negative for suicidal ideas, sleep disturbance, dysphoric mood and agitation. The patient is nervous/anxious.        Objective:   Physical Exam  Constitutional: He is oriented to person, place, and time. He appears well-developed. No distress.  HENT:  Mouth/Throat: Oropharynx is clear and moist.  Eyes: Conjunctivae are normal. Pupils are equal, round, and reactive to light.  Neck: Normal range of  motion. No JVD present. No thyromegaly present.  Cardiovascular: Normal rate, regular rhythm, normal heart sounds and intact distal pulses.  Exam reveals no gallop and no friction rub.   No murmur heard. Pulmonary/Chest: Effort normal and breath sounds normal. No respiratory distress. He has no wheezes. He has no rales. He exhibits no tenderness.  Abdominal: Soft. Bowel sounds are normal. He exhibits no distension and no mass. There is no tenderness. There is no rebound and no guarding.  Musculoskeletal: Normal range of motion. He exhibits no edema and no tenderness.  Lymphadenopathy:    He has no cervical adenopathy.  Neurological: He is alert and oriented to person, place, and time. He displays abnormal reflex. No cranial nerve deficit. He exhibits abnormal muscle tone. Coordination abnormal.  W/c Arms w/cogwheel rigidity  Skin: Skin is warm and dry. No rash noted.  Psychiatric: His behavior is normal. Judgment and thought content normal.  depressed  B blepharitis  Not angry; calm, subdude. He is not hostile, paranoid    Lab Results  Component Value Date   WBC 6.1 06/15/2012   HGB 12.7* 06/15/2012   HCT 38.1* 06/15/2012   PLT 227.0 06/15/2012   GLUCOSE 87 09/16/2012   CHOL 102 06/15/2012   TRIG 54.0 06/15/2012   HDL 47.20 06/15/2012   LDLDIRECT 141.6 07/11/2007   LDLCALC 44 06/15/2012   ALT 16 06/15/2012   AST 16 06/15/2012   NA 140 09/16/2012   K 3.2* 09/16/2012   CL 104 09/16/2012   CREATININE 1.0 09/16/2012   BUN 16 09/16/2012   CO2 29 09/16/2012   TSH 1.25 06/03/2011  PSA 1.24 10/13/2010   INR 0.9 RATIO 07/27/2006   HGBA1C 5.7 09/16/2012          Assessment & Plan:

## 2012-09-22 ENCOUNTER — Ambulatory Visit: Payer: MEDICARE | Admitting: Physical Therapy

## 2012-09-23 ENCOUNTER — Telehealth: Payer: Self-pay | Admitting: Internal Medicine

## 2012-09-23 ENCOUNTER — Ambulatory Visit: Payer: Self-pay | Admitting: Internal Medicine

## 2012-09-23 ENCOUNTER — Ambulatory Visit (INDEPENDENT_AMBULATORY_CARE_PROVIDER_SITE_OTHER): Payer: Medicare Other | Admitting: Internal Medicine

## 2012-09-23 ENCOUNTER — Encounter: Payer: Self-pay | Admitting: Internal Medicine

## 2012-09-23 ENCOUNTER — Ambulatory Visit (INDEPENDENT_AMBULATORY_CARE_PROVIDER_SITE_OTHER)
Admission: RE | Admit: 2012-09-23 | Discharge: 2012-09-23 | Disposition: A | Discharge status: Home / Self Care | Payer: Medicare Other | Source: Ambulatory Visit | Attending: Internal Medicine | Admitting: Internal Medicine

## 2012-09-23 ENCOUNTER — Other Ambulatory Visit: Payer: Self-pay | Admitting: Internal Medicine

## 2012-09-23 VITALS — BP 102/62 | HR 75 | Temp 98.2°F | Ht 64.0 in | Wt 136.0 lb

## 2012-09-23 DIAGNOSIS — R05 Cough: Secondary | ICD-10-CM

## 2012-09-23 DIAGNOSIS — J209 Acute bronchitis, unspecified: Secondary | ICD-10-CM

## 2012-09-23 DIAGNOSIS — R509 Fever, unspecified: Secondary | ICD-10-CM

## 2012-09-23 DIAGNOSIS — R059 Cough, unspecified: Secondary | ICD-10-CM

## 2012-09-23 MED ORDER — LEVOFLOXACIN 500 MG PO TABS: 500.0000 mg | ORAL_TABLET | Freq: Every day | ORAL | Status: DC

## 2012-09-23 MED ORDER — HYDROCODONE-HOMATROPINE 5-1.5 MG/5ML PO SYRP: 5.0000 mL | ORAL_SOLUTION | Freq: Three times a day (TID) | ORAL | Status: DC | PRN

## 2012-09-23 NOTE — Patient Instructions (Signed)

## 2012-09-23 NOTE — Telephone Encounter (Signed)
Patient Information:  Caller Name: Lucendia Herrlich  Phone: [phone]  Patient: [name], [name]  Gender: Male  DOB: 08-02-1924  Age: 79 Years  PCP: Plotnikov, Alex (Adults only)  Office Follow Up:  Does the office need to follow up with this patient?: No  Instructions For The Office: N/A  RN Note:  Seen 09/20/12.  Worried about pneumonia due to continue intermittent fevers with sore throat, frequent dry, "wheezing" cough and congestion.  On Zpack for sore throat.  Had not tested FBS since 09/21/12. Random blood sugar at 118 at 1345; just ate at 1330. BP 127/76. Last temp 99.8 po at 1315; temp. was 100.7 po at 08:00.   Symptoms  Reason For Call & Symptoms: Called regarding no improvement on antibiotic.  continues to have cough and congestuion with fever and sore throat.  Reviewed Health History In EMR: Yes  Reviewed Medications In EMR: Yes  Reviewed Allergies In EMR: Yes  Reviewed Surgeries / Procedures: Yes  Date of Onset of Symptoms: 09/17/2012  Treatments Tried: Zpack, Tylenol  Treatments Tried Worked: No  Any Fever: Yes  Fever Taken: Oral  Fever Time Of Reading: 08:00:00  Fever Last Reading: 100.7  Guideline(s) Used:  Sore Throat  Cough  Disposition Per Guideline:   Go to Office Now  Reason For Disposition Reached:   Fever > 100.5 F (38.1 C) and over 82 years of age  Advice Given:  Reassurance  Coughing is the way that our lungs remove irritants and mucus. It helps protect our lungs from getting pneumonia.  You can get a dry hacking cough after a chest cold. Sometimes this type of cough can last 1-3 weeks, and be worse at night.  Coughing Spasms:  Drink warm fluids. Inhale warm mist (Reason: both relax the airway and loosen up the phlegm).  Prevent Dehydration:  Drink adequate liquids.  This will help soothe an irritated or dry throat and loosen up the phlegm.  Fever Medicines:  For fevers above 101 F (38.3 C) take either acetaminophen or ibuprofen.  Call Back If:  Difficulty breathing  You become worse.  Patient Will Follow Care Advice:  YES  Appointment Scheduled:  09/23/2012 15:15:00 Appointment Scheduled Provider:  Nicki Reaper

## 2012-09-24 ENCOUNTER — Telehealth: Payer: Self-pay | Admitting: *Deleted

## 2012-09-24 ENCOUNTER — Inpatient Hospital Stay (HOSPITAL_COMMUNITY)
Admission: EM | Admit: 2012-09-24 | Discharge: 2012-09-30 | DRG: 193 | Disposition: A | Discharge status: Home / Self Care | Payer: MEDICARE | Attending: Family Medicine | Admitting: Family Medicine

## 2012-09-24 ENCOUNTER — Encounter (HOSPITAL_COMMUNITY): Payer: Self-pay

## 2012-09-24 DIAGNOSIS — Z87891 Personal history of nicotine dependence: Secondary | ICD-10-CM

## 2012-09-24 DIAGNOSIS — R509 Fever, unspecified: Secondary | ICD-10-CM

## 2012-09-24 DIAGNOSIS — R0902 Hypoxemia: Secondary | ICD-10-CM | POA: Diagnosis present

## 2012-09-24 DIAGNOSIS — R319 Hematuria, unspecified: Secondary | ICD-10-CM | POA: Diagnosis present

## 2012-09-24 DIAGNOSIS — Z7982 Long term (current) use of aspirin: Secondary | ICD-10-CM

## 2012-09-24 DIAGNOSIS — R112 Nausea with vomiting, unspecified: Secondary | ICD-10-CM | POA: Diagnosis present

## 2012-09-24 DIAGNOSIS — R05 Cough: Secondary | ICD-10-CM

## 2012-09-24 DIAGNOSIS — E119 Type 2 diabetes mellitus without complications: Secondary | ICD-10-CM

## 2012-09-24 DIAGNOSIS — IMO0002 Reserved for concepts with insufficient information to code with codable children: Secondary | ICD-10-CM | POA: Diagnosis present

## 2012-09-24 DIAGNOSIS — E785 Hyperlipidemia, unspecified: Secondary | ICD-10-CM | POA: Diagnosis present

## 2012-09-24 DIAGNOSIS — Z794 Long term (current) use of insulin: Secondary | ICD-10-CM

## 2012-09-24 DIAGNOSIS — J189 Pneumonia, unspecified organism: Principal | ICD-10-CM

## 2012-09-24 DIAGNOSIS — F0390 Unspecified dementia without behavioral disturbance: Secondary | ICD-10-CM

## 2012-09-24 DIAGNOSIS — N401 Enlarged prostate with lower urinary tract symptoms: Secondary | ICD-10-CM | POA: Diagnosis present

## 2012-09-24 DIAGNOSIS — F3289 Other specified depressive episodes: Secondary | ICD-10-CM | POA: Diagnosis present

## 2012-09-24 DIAGNOSIS — J9601 Acute respiratory failure with hypoxia: Secondary | ICD-10-CM

## 2012-09-24 DIAGNOSIS — R1110 Vomiting, unspecified: Secondary | ICD-10-CM

## 2012-09-24 DIAGNOSIS — R404 Transient alteration of awareness: Secondary | ICD-10-CM | POA: Diagnosis present

## 2012-09-24 DIAGNOSIS — I10 Essential (primary) hypertension: Secondary | ICD-10-CM | POA: Diagnosis present

## 2012-09-24 DIAGNOSIS — N138 Other obstructive and reflux uropathy: Secondary | ICD-10-CM | POA: Diagnosis present

## 2012-09-24 DIAGNOSIS — F329 Major depressive disorder, single episode, unspecified: Secondary | ICD-10-CM | POA: Diagnosis present

## 2012-09-24 DIAGNOSIS — E559 Vitamin D deficiency, unspecified: Secondary | ICD-10-CM | POA: Diagnosis present

## 2012-09-24 DIAGNOSIS — D72829 Elevated white blood cell count, unspecified: Secondary | ICD-10-CM | POA: Diagnosis present

## 2012-09-24 DIAGNOSIS — R339 Retention of urine, unspecified: Secondary | ICD-10-CM

## 2012-09-24 DIAGNOSIS — E538 Deficiency of other specified B group vitamins: Secondary | ICD-10-CM | POA: Diagnosis present

## 2012-09-24 DIAGNOSIS — Z79899 Other long term (current) drug therapy: Secondary | ICD-10-CM

## 2012-09-24 DIAGNOSIS — J9600 Acute respiratory failure, unspecified whether with hypoxia or hypercapnia: Secondary | ICD-10-CM | POA: Diagnosis present

## 2012-09-24 DIAGNOSIS — F411 Generalized anxiety disorder: Secondary | ICD-10-CM

## 2012-09-24 DIAGNOSIS — K219 Gastro-esophageal reflux disease without esophagitis: Secondary | ICD-10-CM | POA: Diagnosis present

## 2012-09-24 LAB — COMPREHENSIVE METABOLIC PANEL
ALT: 10 U/L (ref 0–53)
AST: 12 U/L (ref 0–37)
Alkaline Phosphatase: 54 U/L (ref 39–117)
CO2: 29 mEq/L (ref 19–32)
GFR calc Af Amer: 76 mL/min — ABNORMAL LOW (ref >90)
GFR calc non Af Amer: 66 mL/min — ABNORMAL LOW (ref >90)
Glucose, Bld: 132 mg/dL — ABNORMAL HIGH (ref 70–99)
Potassium: 4.2 mEq/L (ref 3.5–5.1)
Sodium: 140 mEq/L (ref 135–145)

## 2012-09-24 LAB — CBC WITH DIFFERENTIAL/PLATELET
Basophils Absolute: 0 10*3/uL (ref 0.0–0.1)
Eosinophils Absolute: 0 10*3/uL (ref 0.0–0.7)
Eosinophils Absolute: 0 10*3/uL (ref 0.0–0.7)
Eosinophils Relative: 0 % (ref 0–5)
HCT: 35.8 % — ABNORMAL LOW (ref 39.0–52.0)
Lymphocytes Relative: 11 % — ABNORMAL LOW (ref 12–46)
Lymphs Abs: 1.5 10*3/uL (ref 0.7–4.0)
MCH: 33.8 pg (ref 26.0–34.0)
MCHC: 33.8 g/dL (ref 30.0–36.0)
MCV: 99.2 fL (ref 78.0–100.0)
Monocytes Absolute: 0.8 10*3/uL (ref 0.1–1.0)
Monocytes Relative: 7 % (ref 3–12)
Neutrophils Relative %: 82 % — ABNORMAL HIGH (ref 43–77)
Platelets: 210 10*3/uL (ref 150–400)
RBC: 3.61 MIL/uL — ABNORMAL LOW (ref 4.22–5.81)
RDW: 14.7 % (ref 11.5–15.5)
WBC: 12.6 10*3/uL — ABNORMAL HIGH (ref 4.0–10.5)

## 2012-09-24 LAB — POCT I-STAT, CHEM 8
Calcium, Ion: 1.22 mmol/L (ref 1.13–1.30)
Glucose, Bld: 142 mg/dL — ABNORMAL HIGH (ref 70–99)
HCT: 39 % (ref 39.0–52.0)
Hemoglobin: 13.3 g/dL (ref 13.0–17.0)
TCO2: 29 mmol/L (ref 0–100)

## 2012-09-24 LAB — GLUCOSE, CAPILLARY
Glucose-Capillary: 143 mg/dL — ABNORMAL HIGH (ref 70–99)
Glucose-Capillary: 130 mg/dL — ABNORMAL HIGH (ref 70–99)

## 2012-09-24 LAB — APTT: aPTT: 32 seconds (ref 24–37)

## 2012-09-24 MED ORDER — LEVOFLOXACIN IN D5W 750 MG/150ML IV SOLN
750.0000 mg | INTRAVENOUS | Status: DC
  Administered 2012-09-24 – 2012-09-26 (×2): 750 mg via INTRAVENOUS
  Filled 2012-09-24: qty 150

## 2012-09-24 MED ORDER — IRBESARTAN 300 MG PO TABS
300.0000 mg | ORAL_TABLET | Freq: Every day | ORAL | Status: DC
  Administered 2012-09-25 – 2012-09-30 (×6): 300 mg via ORAL
  Filled 2012-09-24 (×7): qty 1

## 2012-09-24 MED ORDER — ACETAMINOPHEN 325 MG PO TABS
650.0000 mg | ORAL_TABLET | Freq: Four times a day (QID) | ORAL | Status: DC | PRN
  Administered 2012-09-26 – 2012-09-30 (×2): 650 mg via ORAL
  Filled 2012-09-24 (×2): qty 2

## 2012-09-24 MED ORDER — LEVOFLOXACIN IN D5W 750 MG/150ML IV SOLN: 750.0000 mg | INTRAVENOUS | Status: DC

## 2012-09-24 MED ORDER — AMLODIPINE BESYLATE 10 MG PO TABS
10.0000 mg | ORAL_TABLET | Freq: Every day | ORAL | Status: DC
  Administered 2012-09-25 – 2012-09-30 (×6): 10 mg via ORAL
  Filled 2012-09-24 (×7): qty 1

## 2012-09-24 MED ORDER — PYRIDOXINE HCL 25 MG PO TABS
25.0000 mg | ORAL_TABLET | Freq: Every day | ORAL | Status: DC
  Administered 2012-09-25 – 2012-09-30 (×6): 25 mg via ORAL
  Filled 2012-09-24 (×7): qty 1

## 2012-09-24 MED ORDER — METFORMIN HCL 500 MG PO TABS
1000.0000 mg | ORAL_TABLET | Freq: Every day | ORAL | Status: DC
  Administered 2012-09-25 – 2012-09-30 (×6): 1000 mg via ORAL
  Filled 2012-09-24 (×8): qty 2

## 2012-09-24 MED ORDER — VITAMIN D 1000 UNITS PO TABS
1000.0000 [IU] | ORAL_TABLET | Freq: Every day | ORAL | Status: DC
  Administered 2012-09-25 – 2012-09-30 (×6): 1000 [IU] via ORAL
  Filled 2012-09-24 (×7): qty 1

## 2012-09-24 MED ORDER — HYDROCODONE-ACETAMINOPHEN 5-325 MG PO TABS
1.0000 | ORAL_TABLET | ORAL | Status: DC | PRN
  Administered 2012-09-26: 2 via ORAL
  Filled 2012-09-24 (×2): qty 2

## 2012-09-24 MED ORDER — DONEPEZIL HCL 5 MG PO TABS
5.0000 mg | ORAL_TABLET | Freq: Every day | ORAL | Status: DC
  Administered 2012-09-25 – 2012-09-29 (×6): 5 mg via ORAL
  Filled 2012-09-24 (×7): qty 1

## 2012-09-24 MED ORDER — VITAMIN C 500 MG PO TABS
500.0000 mg | ORAL_TABLET | Freq: Every day | ORAL | Status: DC
  Administered 2012-09-25 – 2012-09-30 (×6): 500 mg via ORAL
  Filled 2012-09-24 (×7): qty 1

## 2012-09-24 MED ORDER — MORPHINE SULFATE 2 MG/ML IJ SOLN
1.0000 mg | INTRAMUSCULAR | Status: DC | PRN
  Administered 2012-09-27: 1 mg via INTRAVENOUS
  Filled 2012-09-24: qty 1

## 2012-09-24 MED ORDER — ATENOLOL 25 MG PO TABS
25.0000 mg | ORAL_TABLET | Freq: Two times a day (BID) | ORAL | Status: DC
  Administered 2012-09-25 – 2012-09-30 (×12): 25 mg via ORAL
  Filled 2012-09-24 (×13): qty 1

## 2012-09-24 MED ORDER — SODIUM CHLORIDE 0.9 % IV SOLN: INTRAVENOUS | Status: DC

## 2012-09-24 MED ORDER — LEVOFLOXACIN IN D5W 750 MG/150ML IV SOLN
750.0000 mg | Freq: Once | INTRAVENOUS | Status: DC
  Filled 2012-09-24: qty 150

## 2012-09-24 MED ORDER — LEVOFLOXACIN 500 MG PO TABS: 500.0000 mg | ORAL_TABLET | Freq: Every day | ORAL | Status: DC

## 2012-09-24 MED ORDER — IPRATROPIUM BROMIDE 0.02 % IN SOLN
0.5000 mg | RESPIRATORY_TRACT | Status: DC | PRN
  Administered 2012-09-29: 0.5 mg via RESPIRATORY_TRACT
  Filled 2012-09-24: qty 2.5

## 2012-09-24 MED ORDER — ZOLPIDEM TARTRATE 5 MG PO TABS
5.0000 mg | ORAL_TABLET | Freq: Every day | ORAL | Status: DC
  Administered 2012-09-25 – 2012-09-29 (×6): 5 mg via ORAL
  Filled 2012-09-24 (×6): qty 1

## 2012-09-24 MED ORDER — ASPIRIN EC 81 MG PO TBEC
81.0000 mg | DELAYED_RELEASE_TABLET | Freq: Every day | ORAL | Status: DC
  Administered 2012-09-25 – 2012-09-30 (×6): 81 mg via ORAL
  Filled 2012-09-24 (×7): qty 1

## 2012-09-24 MED ORDER — DICLOFENAC SODIUM 1 % TD GEL
2.0000 g | Freq: Four times a day (QID) | TRANSDERMAL | Status: DC | PRN
  Filled 2012-09-24: qty 100

## 2012-09-24 MED ORDER — ERYTHROMYCIN 5 MG/GM OP OINT: TOPICAL_OINTMENT | Freq: Every day | OPHTHALMIC | Status: DC

## 2012-09-24 MED ORDER — GLIPIZIDE ER 10 MG PO TB24
10.0000 mg | ORAL_TABLET | Freq: Every day | ORAL | Status: DC
  Administered 2012-09-25 – 2012-09-28 (×4): 10 mg via ORAL
  Filled 2012-09-24 (×6): qty 1

## 2012-09-24 MED ORDER — ACETAMINOPHEN 650 MG RE SUPP: 650.0000 mg | Freq: Four times a day (QID) | RECTAL | Status: DC | PRN

## 2012-09-24 MED ORDER — METFORMIN HCL 500 MG PO TABS
500.0000 mg | ORAL_TABLET | Freq: Every day | ORAL | Status: DC
  Administered 2012-09-26 – 2012-09-29 (×4): 500 mg via ORAL
  Filled 2012-09-24 (×6): qty 1

## 2012-09-24 MED ORDER — SIMVASTATIN 40 MG PO TABS
40.0000 mg | ORAL_TABLET | Freq: Every day | ORAL | Status: DC
  Administered 2012-09-25: 40 mg via ORAL
  Filled 2012-09-24 (×2): qty 1

## 2012-09-24 MED ORDER — CLONAZEPAM 0.5 MG PO TABS
0.5000 mg | ORAL_TABLET | Freq: Three times a day (TID) | ORAL | Status: DC | PRN
  Administered 2012-09-25 – 2012-09-30 (×8): 0.5 mg via ORAL
  Filled 2012-09-24 (×8): qty 1

## 2012-09-24 MED ORDER — SODIUM CHLORIDE 0.9 % IV SOLN
1000.0000 mL | Freq: Once | INTRAVENOUS | Status: AC
  Administered 2012-09-24: 1000 mL via INTRAVENOUS

## 2012-09-24 MED ORDER — VALSARTAN-HYDROCHLOROTHIAZIDE 320-12.5 MG PO TABS: 1.0000 | ORAL_TABLET | Freq: Every day | ORAL | Status: DC

## 2012-09-24 MED ORDER — SODIUM CHLORIDE 0.9 % IV SOLN
INTRAVENOUS | Status: DC
  Administered 2012-09-24: 22:00:00 via INTRAVENOUS

## 2012-09-24 MED ORDER — GUAIFENESIN-DM 100-10 MG/5ML PO SYRP
5.0000 mL | ORAL_SOLUTION | ORAL | Status: DC | PRN
  Administered 2012-09-25 – 2012-09-26 (×2): 5 mL via ORAL
  Filled 2012-09-24 (×3): qty 10

## 2012-09-24 MED ORDER — CYANOCOBALAMIN 1000 MCG/ML IJ SOLN
1000.0000 ug | INTRAMUSCULAR | Status: DC
  Filled 2012-09-24: qty 1

## 2012-09-24 MED ORDER — ONDANSETRON HCL 4 MG PO TABS: 4.0000 mg | ORAL_TABLET | Freq: Four times a day (QID) | ORAL | Status: DC | PRN

## 2012-09-24 MED ORDER — ALBUTEROL SULFATE (5 MG/ML) 0.5% IN NEBU: 2.5000 mg | INHALATION_SOLUTION | RESPIRATORY_TRACT | Status: DC | PRN

## 2012-09-24 MED ORDER — ONDANSETRON HCL 4 MG/2ML IJ SOLN
4.0000 mg | Freq: Four times a day (QID) | INTRAMUSCULAR | Status: DC | PRN
  Administered 2012-09-24: 4 mg via INTRAVENOUS
  Filled 2012-09-24: qty 2

## 2012-09-24 MED ORDER — HYDROCHLOROTHIAZIDE 12.5 MG PO CAPS
12.5000 mg | ORAL_CAPSULE | Freq: Every day | ORAL | Status: DC
  Administered 2012-09-25 – 2012-09-30 (×6): 12.5 mg via ORAL
  Filled 2012-09-24 (×7): qty 1

## 2012-09-24 NOTE — Progress Notes (Signed)
ANTIBIOTIC CONSULT NOTE - INITIAL  Pharmacy Consult for pharmacy to renally adjust antibiotics (levofloxacin) Indication: CAP  Allergies  Allergen Reactions  . Citalopram Hydrobromide     REACTION: "crazy"  . Duloxetine     REACTION: did not like    Patient Measurements:   Adjusted Body Weight:   Vital Signs: Temp: 98.1 F (36.7 C) (03/29 1841) Temp src: Oral (03/29 1841) BP: 129/76 mmHg (03/29 1841) Intake/Output from previous day:   Intake/Output from this shift:    Labs:  Recent Labs  09/24/12 1910 09/24/12 1917  WBC 12.6*  --   HGB 12.8* 13.3  PLT 221  --   CREATININE  --  1.20   The CrCl is unknown because both a height and weight (above a minimum accepted value) are required for this calculation. No results found for this basename: VANCOTROUGH, VANCOPEAK, VANCORANDOM, GENTTROUGH, GENTPEAK, GENTRANDOM, TOBRATROUGH, TOBRAPEAK, TOBRARND, AMIKACINPEAK, AMIKACINTROU, AMIKACIN,  in the last 72 hours   Microbiology: No results found for this or any previous visit (from the past 720 hour(s)).  Medical History: Past Medical History  Diagnosis Date  . Carotid artery stenosis   . Gait disturbance   . Anxiety   . GERD (gastroesophageal reflux disease)   . Vitamin B12 deficiency   . Depression   . Osteoarthritis   . Diabetes mellitus, type 2   . Hypertension   . Vitamin D deficiency   . Tremor   . Aortic stenosis   . Hyperlipidemia   . Diverticulosis   . Adenomatous polyp of colon     Assessment: 61 YOM admited with cough and congestion, was on cephalexin/azithro as outpatient then changed to levofloxacin 3/28, but appears pharmacy would not fill it. CXR reveals infiltrate.   3/29 >>levofloxacin >>  Tmax: afeb WBCs: 12.6 Renal: Scr = 1.2 (est CrCl ~73ml/min)  3/29 urine:  3/29 sputum:  3/29: legionella 3/29: S. pneumo 3/29: Flu panel  Dose changes/drug level info:   / : D#  Plan:   Change levofloxacin to 750mg  IV q48h  Dannielle Huh 09/24/2012,8:18 PM

## 2012-09-24 NOTE — ED Notes (Signed)
Pt reports decreased urination but pt hasn't been tolerating PO fluids well.  Pt's O2 was 89% on RA.  Pt put on 2L [place] and now O2 sat is 97%.

## 2012-09-24 NOTE — H&P (Addendum)
[facility] History and Physical  [name] WUJ:[id number] DOB: 1925-02-19 DOA: 09/24/2012  Referring physician: ER physician PCP: Sonda Primes, MD   Chief Complaint: weakness, fever, shortness of breath  HPI:  79 year old male with past medical history of HTN, DM, dyslipidemia who presented to [facility] ED with generalized weakness and persistent fevers and cough for about 1 week prior to this admission. Patient was seen in PCP office and was given azithromycin but has progressively gotten worse. He has returned to his PCP and was given Levaquin which he never had a change to take as he developed sudden nausea and vomiting. Patient reported no chest pain, he did have some shortness of breath but no palpitations. No reports of blood in stool or urine. No lightheadedness or loss of consciousness. In ED, patient's oxygen saturation was 89% while breathing ambient air. With 2 L nasal canula, O2 saturation increased to 97%. Further evaluation included CXR which showed right basilar infiltrates. CBC revealed mild leukocytosis of 12.6. Hemoglobin was mildly decreased at 12.8.   Assessment and Plan:  Principal Problem: *Acute respiratory failure with hypoxia  Likely due to community acquired pneumonia, right lower lobe pneumonia  Respiratory status is now stable, O2 saturation is 95 - 97% on 2 L nasal canula  We will start Levaquin IV daily (as patient has failed outpatient treatment with Azithromycin)  Albuterol and Atrovent Q 4 hours as needed for shortness of breath and/or wheezing  Pneumonia order set in place; follow up blood cultures, resp culture, H.flu, legionella and strep pneumoniae Active Problems: CAP (community acquired pneumonia)  Continue Levaquin IV daily  Pneumonia order set in place  Follow up blood culture, respiratory culture, legionella, H.flu and strep pneumonia Nausea and vomiting  Likely due to po azithromycin  Continue IV fluids for next 12  hours  Antiemetics as needed Diabetes  Continue glipizide and metformin  Check A1c HYPERLIPIDEMIA  Continue simvastatin HYPERTENSION  Continue home meds: Norvasc, atenolol, diovan ANXIETY AND DEPRESSION  Continue Clonazepam Dementia  Continue aricept Leukocytosis  Secondary to community acquired pneumonia  Management as indicated above VITAMIN B12 and B6  DEFICIENCY  Continue B12 and B6 medication supplements per home doasge  Code Status: Full Family Communication: Pt at bedside Disposition Plan: Admit for further evaluation  Manson Passey, MD  [facility] Pager [phone]  If 7PM-7AM, please contact night-coverage [email or web address] Password [email or web address] 09/24/2012, 8:12 PM   Review of Systems:  Constitutional: positive for fever, chills and malaise/fatigue. Negative for diaphoresis.  HENT: Negative for hearing loss, ear pain, nosebleeds, congestion, sore throat, neck pain, tinnitus and ear discharge   Eyes: Negative for blurred vision, double vision, photophobia, pain, discharge and redness.  Respiratory: per HPI   Cardiovascular: Negative for chest pain, palpitations, orthopnea, claudication and leg swelling.  Gastrointestinal: positive for nausea, vomiting and negative for abdominal pain. Negative for heartburn, constipation, blood in stool and melena.  Genitourinary: Negative for dysuria, urgency, frequency, hematuria and flank pain.  Musculoskeletal: Negative for myalgias, back pain, joint pain and falls.  Skin: Negative for itching and rash.  Neurological: Negative for dizziness and weakness. Negative for tingling, tremors, sensory change, speech change, focal weakness, loss of consciousness and headaches.  Endo/Heme/Allergies: Negative for environmental allergies and polydipsia. Does not bruise/bleed easily.  Psychiatric/Behavioral: Negative for suicidal ideas. The patient is not nervous/anxious.      Past Medical History  Diagnosis Date  . Carotid artery stenosis   . Gait  disturbance   . Anxiety   .  GERD (gastroesophageal reflux disease)   . Vitamin B12 deficiency   . Depression   . Osteoarthritis   . Diabetes mellitus, type 2   . Hypertension   . Vitamin D deficiency   . Tremor   . Aortic stenosis   . Hyperlipidemia   . Diverticulosis   . Adenomatous polyp of colon    Past Surgical History  Procedure Laterality Date  . Vasectomy    . Lumbar laminectomy      L5 Dr Ophelia Charter  . Transurethral resection of prostate  March 2007   Social History:  reports that he has quit smoking. His smoking use included Cigars. He has never used smokeless tobacco. He reports that he does not drink alcohol or use illicit drugs.  Allergies  Allergen Reactions  . Citalopram Hydrobromide     REACTION: "crazy"  . Duloxetine     REACTION: did not like    Family History:  Family History  Problem Relation Age of Onset  . Diabetes Father   . Colon cancer Brother      Prior to Admission medications   Medication Sig Start Date End Date Taking? Authorizing Provider  acetaminophen (TYLENOL) 500 MG tablet Take 1,000 mg by mouth every 6 (six) hours as needed. For pain.   Yes Historical Provider, MD  amLODipine (NORVASC) 10 MG tablet Take 1 tablet (10 mg total) by mouth daily. 04/20/12  Yes Georgina Quint Plotnikov, MD  aspirin EC 81 MG tablet Take 81 mg by mouth daily.   Yes Historical Provider, MD  atenolol (TENORMIN) 50 MG tablet Take 25 mg by mouth as needed. For palpitations. 06/20/12  Yes Georgina Quint Plotnikov, MD  Cholecalciferol (VITAMIN D3) 1000 UNIT capsule Take 1,000 Units by mouth daily.    Yes Historical Provider, MD  clonazePAM (KLONOPIN) 0.5 MG tablet Take 1 tablet (0.5 mg total) by mouth 3 (three) times daily as needed for anxiety. For anxiety. 09/20/12  Yes Georgina Quint Plotnikov, MD  cyanocobalamin (,VITAMIN B-12,) 1000 MCG/ML injection Inject 1 mL (1,000 mcg total) into the muscle every 14 (fourteen) days. 06/20/12  Yes Georgina Quint Plotnikov, MD  diclofenac sodium  (VOLTAREN) 1 % GEL Apply 2 g topically 4 (four) times daily as needed. For joint pain.   Yes Historical Provider, MD  donepezil (ARICEPT) 5 MG tablet Take 5 mg by mouth at bedtime.   Yes Historical Provider, MD  glipiZIDE (GLUCOTROL XL) 10 MG 24 hr tablet Take 1 tablet (10 mg total) by mouth daily. 04/20/12  Yes Georgina Quint Plotnikov, MD  HYDROcodone-homatropine (HYCODAN) 5-1.5 MG/5ML syrup Take 5 mLs by mouth every 8 (eight) hours as needed for cough. 09/23/12  Yes Nicki Reaper, NP  metFORMIN (GLUCOPHAGE) 1000 MG tablet Take 500-1,000 mg by mouth 2 (two) times daily with a meal. 1 tab in am, 0.5 tab in pm   Yes Historical Provider, MD  Multiple Vitamins-Minerals (ICAPS PO) Take 2 capsules by mouth daily.   Yes Historical Provider, MD  potassium chloride (K-DUR,KLOR-CON) 10 MEQ tablet Take 1 tablet (10 mEq total) by mouth 2 (two) times daily. 09/20/12  Yes Georgina Quint Plotnikov, MD  Pyridoxine HCl (VITAMIN B-6 PO) Take 1 tablet by mouth daily.   Yes Historical Provider, MD  simvastatin (ZOCOR) 40 MG tablet Take 1 tablet (40 mg total) by mouth at bedtime. 09/20/12  Yes Georgina Quint Plotnikov, MD  valsartan-hydrochlorothiazide (DIOVAN-HCT) 320-12.5 MG per tablet Take 1 tablet by mouth daily. 11/16/11  Yes Tresa Garter, MD  vitamin C (ASCORBIC  ACID) 500 MG tablet Take 500 mg by mouth daily.   Yes Historical Provider, MD  zolpidem (AMBIEN) 10 MG tablet Take 15 mg by mouth at bedtime. 06/20/12  Yes Georgina Quint Plotnikov, MD  levofloxacin (LEVAQUIN) 500 MG tablet Take 1 tablet (500 mg total) by mouth daily. 09/24/12   Tresa Garter, MD   Physical Exam: Filed Vitals:   09/24/12 1841  BP: 129/76  Temp: 98.1 F (36.7 C)  TempSrc: Oral  Resp: 15  SpO2: 97%    Physical Exam  Constitutional: Appears well-developed and well-nourished. No distress.  HENT: Normocephalic. External right and left ear normal. Oropharynx is clear and moist.  Eyes: Conjunctivae and EOM are normal. PERRLA, no scleral icterus.   Neck: Normal ROM. Neck supple. No JVD. No tracheal deviation. No thyromegaly.  CVS: RRR, S1/S2 +, SEM appreciated   Pulmonary: diminished breath sounds, some wheezing in mid lung lobes.  Abdominal: Soft. BS +,  no distension, tenderness, rebound or guarding.  Musculoskeletal: Normal range of motion. No edema and no tenderness.  Lymphadenopathy: No lymphadenopathy noted, cervical, inguinal. Neuro: Alert. Normal reflexes, muscle tone coordination. No cranial nerve deficit. Skin: Skin is warm and dry. No rash noted. Not diaphoretic. No erythema. No pallor.  Psychiatric: Normal mood and affect. Behavior, judgment, thought content normal.   Labs on Admission:  Basic Metabolic Panel:  Recent Labs Lab 09/24/12 1917  NA 141  K 4.2  CL 105  GLUCOSE 142*  BUN 25*  CREATININE 1.20   Liver Function Tests: No results found for this basename: AST, ALT, ALKPHOS, BILITOT, PROT, ALBUMIN,  in the last 168 hours No results found for this basename: LIPASE, AMYLASE,  in the last 168 hours No results found for this basename: AMMONIA,  in the last 168 hours CBC:  Recent Labs Lab 09/24/12 1910 09/24/12 1917  WBC 12.6*  --   NEUTROABS 10.3*  --   HGB 12.8* 13.3  HCT 37.9* 39.0  MCV 99.5  --   PLT 221  --    Cardiac Enzymes: No results found for this basename: CKTOTAL, CKMB, CKMBINDEX, TROPONINI,  in the last 168 hours BNP: No components found with this basename: POCBNP,  CBG:  Recent Labs Lab 09/24/12 1844  GLUCAP 143*    Radiological Exams on Admission: Dg Chest 2 View 09/23/2012   IMPRESSION: Right basilar infiltrate.   Original Report Authenticated By: Alcide Clever, M.D.     EKG: Normal sinus rhythm, no ST/T wave changes  Time spent: 100 minutes

## 2012-09-24 NOTE — Telephone Encounter (Signed)
New ATB unable to be filled at [facility] per pharmacy.  Rx called into [facility], [place] [facility] [address].  Wife is aware

## 2012-09-24 NOTE — ED Notes (Signed)
This past [name] patient was having congestion, coughing, and fever. Taken to [facility] with [facility] and diagnosed with congestion and started on z-pak. Went back to [place] [facility] with more congestion. CXR taken and was diagnosed with pneumonia.

## 2012-09-24 NOTE — ED Provider Notes (Addendum)
History     CSN: [id number]  Arrival date & time 09/24/12  4098   First MD Initiated Contact with Patient 09/24/12 1834      Chief Complaint  Patient presents with  . Nausea  . Emesis    (Consider location/radiation/quality/duration/timing/severity/associated sxs/prior treatment) HPI Comments: Patient has a one-week history of cough and chest congestion. He was started on a Z-Pak by his primary care physician and today was his last day of taking that. Over the last 2 days he started having fevers and decreased appetite. He saw his primary care physician yesterday and was given a prescription for Levaquin however the pharmacy for some reason wouldn't fill it as he was still on the Zithromax. So he's not yet currently started on Levaquin. He started having vomiting this morning and has been unable to keep anything down throughout the day. He denies pain shortness of breath. She denies any chest pain or abdominal pain. He's been feeling worse throughout the day.  Patient is a 79 y.o. male presenting with vomiting.  Emesis Associated symptoms: no abdominal pain, no arthralgias, no chills, no diarrhea and no headaches     Past Medical History  Diagnosis Date  . Carotid artery stenosis   . Gait disturbance   . Anxiety   . GERD (gastroesophageal reflux disease)   . Vitamin B12 deficiency   . Depression   . Osteoarthritis   . Diabetes mellitus, type 2   . Hypertension   . Vitamin D deficiency   . Tremor   . Aortic stenosis   . Hyperlipidemia   . Diverticulosis   . Adenomatous polyp of colon     Past Surgical History  Procedure Laterality Date  . Vasectomy    . Lumbar laminectomy      L5 Dr Ophelia Charter  . Transurethral resection of prostate  March 2007    Family History  Problem Relation Age of Onset  . Diabetes Father   . Colon cancer Brother     History  Substance Use Topics  . Smoking status: Former Smoker    Types: Cigars  . Smokeless tobacco: Never Used  . Alcohol  Use: No      Review of Systems  Constitutional: Positive for fever and fatigue. Negative for chills and diaphoresis.  HENT: Negative for congestion, rhinorrhea and sneezing.   Eyes: Positive for photophobia.  Respiratory: Positive for cough. Negative for chest tightness and shortness of breath.   Cardiovascular: Negative for chest pain and leg swelling.  Gastrointestinal: Positive for nausea and vomiting. Negative for abdominal pain, diarrhea and blood in stool.  Genitourinary: Positive for decreased urine volume. Negative for frequency, hematuria, flank pain and difficulty urinating.  Musculoskeletal: Negative for back pain and arthralgias.  Skin: Negative for rash.  Neurological: Negative for dizziness, speech difficulty, weakness, numbness and headaches.    Allergies  Citalopram hydrobromide and Duloxetine  Home Medications   Current Outpatient Rx  Name  Route  Sig  Dispense  Refill  . acetaminophen (TYLENOL) 500 MG tablet   Oral   Take 1,000 mg by mouth every 6 (six) hours as needed. For pain.         Marland Kitchen amLODipine (NORVASC) 10 MG tablet   Oral   Take 1 tablet (10 mg total) by mouth daily.   90 tablet   3   . aspirin EC 81 MG tablet   Oral   Take 81 mg by mouth daily.         Marland Kitchen atenolol (  TENORMIN) 50 MG tablet   Oral   Take 0.5 tablets (25 mg total) by mouth 2 (two) times daily. For palpitations.   180 tablet   3   . Cholecalciferol (VITAMIN D3) 1000 UNIT capsule   Oral   Take 1,000 Units by mouth daily.          . clonazePAM (KLONOPIN) 0.5 MG tablet   Oral   Take 1 tablet (0.5 mg total) by mouth 3 (three) times daily as needed for anxiety. For anxiety.   90 tablet   3   . cyanocobalamin (,VITAMIN B-12,) 1000 MCG/ML injection   Intramuscular   Inject 1 mL (1,000 mcg total) into the muscle every 14 (fourteen) days.   1 mL   11   . diclofenac sodium (VOLTAREN) 1 % GEL   Topical   Apply 2 g topically 4 (four) times daily as needed. For joint  pain.         Marland Kitchen donepezil (ARICEPT) 10 MG tablet      TAKE ONE TABLET BY MOUTH AT BEDTIME   90 tablet   3   . donepezil (ARICEPT) 5 MG tablet   Oral   Take 5 mg by mouth at bedtime.         Marland Kitchen erythromycin ophthalmic ointment   Both Eyes   Place into both eyes at bedtime. In B eyes   3.5 g   3   . glipiZIDE (GLUCOTROL XL) 10 MG 24 hr tablet   Oral   Take 1 tablet (10 mg total) by mouth daily.   90 tablet   3   . glucose blood test strip      Use as instructed   50 each   5     Please dispense for pt's brand of choice. [employer] ...   . HYDROcodone-homatropine (HYCODAN) 5-1.5 MG/5ML syrup   Oral   Take 5 mLs by mouth every 8 (eight) hours as needed for cough.   120 mL   0   . ipratropium (ATROVENT) 0.06 % nasal spray   Nasal   Place 2 sprays into the nose 3 (three) times daily as needed. For dryness.   15 mL   5   . levofloxacin (LEVAQUIN) 500 MG tablet   Oral   Take 1 tablet (500 mg total) by mouth daily.   7 tablet   0   . metFORMIN (GLUCOPHAGE) 1000 MG tablet   Oral   Take 500-1,000 mg by mouth 2 (two) times daily with a meal. 1 tab in am, 0.5 tab in pm         . Multiple Vitamins-Minerals (ICAPS PO)   Oral   Take 2 capsules by mouth daily.         . potassium chloride (K-DUR,KLOR-CON) 10 MEQ tablet   Oral   Take 1 tablet (10 mEq total) by mouth 2 (two) times daily.   180 tablet   3   . Pyridoxine HCl (VITAMIN B-6 PO)   Oral   Take 1 tablet by mouth daily.         . simvastatin (ZOCOR) 40 MG tablet   Oral   Take 1 tablet (40 mg total) by mouth at bedtime.   90 tablet   3   . valsartan-hydrochlorothiazide (DIOVAN-HCT) 320-12.5 MG per tablet   Oral   Take 1 tablet by mouth daily.   90 tablet   3   . vitamin C (ASCORBIC ACID) 500 MG tablet   Oral  Take 500 mg by mouth daily.         Marland Kitchen zolpidem (AMBIEN) 10 MG tablet   Oral   Take 1-1.5 tablets (10-15 mg total) by mouth at bedtime.   90 tablet   1     BP 129/76   Temp(Src) 98.1 F (36.7 C) (Oral)  Resp 15  SpO2 97%  Physical Exam  Constitutional: He is oriented to person, place, and time. He appears well-developed and well-nourished.  HENT:  Head: Normocephalic and atraumatic.  Dry mucous membranes  Eyes: Pupils are equal, round, and reactive to light.  Neck: Normal range of motion. Neck supple.  Cardiovascular: Normal rate, regular rhythm and normal heart sounds.   Pulmonary/Chest: Effort normal. No respiratory distress. He has no wheezes. He has rales. He exhibits no tenderness.  Abdominal: Soft. Bowel sounds are normal. There is no tenderness. There is no rebound and no guarding.  Musculoskeletal: Normal range of motion. He exhibits no edema.  Lymphadenopathy:    He has no cervical adenopathy.  Neurological: He is alert and oriented to person, place, and time.  Skin: Skin is warm and dry. No rash noted.  Psychiatric: He has a normal mood and affect.    ED Course  Procedures (including critical care time)  Results for orders placed during the hospital encounter of 09/24/12  CBC WITH DIFFERENTIAL      Result Value Range   WBC 12.6 (*) 4.0 - 10.5 K/uL   RBC 3.81 (*) 4.22 - 5.81 MIL/uL   Hemoglobin 12.8 (*) 13.0 - 17.0 g/dL   HCT 40.9 (*) 81.1 - 91.4 %   MCV 99.5  78.0 - 100.0 fL   MCH 33.6  26.0 - 34.0 pg   MCHC 33.8  30.0 - 36.0 g/dL   RDW 78.2  95.6 - 21.3 %   Platelets 221  150 - 400 K/uL   Neutrophils Relative 82 (*) 43 - 77 %   Lymphocytes Relative 11 (*) 12 - 46 %   Monocytes Relative 7  3 - 12 %   Eosinophils Relative 0  0 - 5 %   Basophils Relative 0  0 - 1 %   Neutro Abs 10.3 (*) 1.7 - 7.7 K/uL   Lymphs Abs 1.4  0.7 - 4.0 K/uL   Monocytes Absolute 0.9  0.1 - 1.0 K/uL   Eosinophils Absolute 0.0  0.0 - 0.7 K/uL   Basophils Absolute 0.0  0.0 - 0.1 K/uL   Smear Review MORPHOLOGY UNREMARKABLE    GLUCOSE, CAPILLARY      Result Value Range   Glucose-Capillary 143 (*) 70 - 99 mg/dL   Comment 1 Documented in Chart      Comment 2 Notify RN    POCT I-STAT, CHEM 8      Result Value Range   Sodium 141  135 - 145 mEq/L   Potassium 4.2  3.5 - 5.1 mEq/L   Chloride 105  96 - 112 mEq/L   BUN 25 (*) 6 - 23 mg/dL   Creatinine, Ser 0.86  0.50 - 1.35 mg/dL   Glucose, Bld 578 (*) 70 - 99 mg/dL   Calcium, Ion 4.69  6.29 - 1.30 mmol/L   TCO2 29  0 - 100 mmol/L   Hemoglobin 13.3  13.0 - 17.0 g/dL   HCT 52.8  41.3 - 24.4 %   Dg Chest 2 View  09/23/2012  *RADIOLOGY REPORT*  Clinical Data: Chest pain and cough  CHEST - 2 VIEW  Comparison: 09/06/2011  Findings: The cardiac shadow is stable.  The left lung remains clear.  Increased density is noted in the right lung base projecting in the right middle lobe consistent with acute pneumonic infiltrate.  IMPRESSION: Right basilar infiltrate.   Original Report Authenticated By: Alcide Clever, M.D.      Dg Chest 2 View  09/23/2012  *RADIOLOGY REPORT*  Clinical Data: Chest pain and cough  CHEST - 2 VIEW  Comparison: 09/06/2011  Findings: The cardiac shadow is stable.  The left lung remains clear.  Increased density is noted in the right lung base projecting in the right middle lobe consistent with acute pneumonic infiltrate.  IMPRESSION: Right basilar infiltrate.   Original Report Authenticated By: Alcide Clever, M.D.     Date: 09/24/2012  Rate: 89  Rhythm: atrial fibrillation  QRS Axis: normal  Intervals: normal  ST/T Wave abnormalities: nonspecific ST/T changes  Conduction Disutrbances:none  Narrative Interpretation:   Old EKG Reviewed: unchanged    1. Vomiting   2. Community acquired pneumonia       MDM  Patient with pneumonia who has failed outpatient treatment and now has vomiting and intermittent hypoxia. He was placed on oxygen therapy and IV fluids were started. IV antibiotics were initiated. A consult with the hospitalist for admission. His oxygen saturation would fluctuate between 87 and 94 on room air so he was placed on supplemental  oxygen.        Rolan Bucco, MD 09/24/12 1950  Rolan Bucco, MD 09/24/12 [id number]

## 2012-09-25 ENCOUNTER — Encounter: Payer: Self-pay | Admitting: Internal Medicine

## 2012-09-25 DIAGNOSIS — R339 Retention of urine, unspecified: Secondary | ICD-10-CM | POA: Diagnosis present

## 2012-09-25 LAB — COMPREHENSIVE METABOLIC PANEL
BUN: 21 mg/dL (ref 6–23)
CO2: 26 mEq/L (ref 19–32)
Calcium: 9.1 mg/dL (ref 8.4–10.5)
Creatinine, Ser: 0.9 mg/dL (ref 0.50–1.35)
GFR calc Af Amer: 87 mL/min — ABNORMAL LOW (ref >90)
GFR calc non Af Amer: 75 mL/min — ABNORMAL LOW (ref >90)
Glucose, Bld: 75 mg/dL (ref 70–99)
Sodium: 140 mEq/L (ref 135–145)
Total Protein: 6.9 g/dL (ref 6.0–8.3)

## 2012-09-25 LAB — LEGIONELLA ANTIGEN, URINE

## 2012-09-25 LAB — CBC
HCT: 33.3 % — ABNORMAL LOW (ref 39.0–52.0)
Hemoglobin: 11.3 g/dL — ABNORMAL LOW (ref 13.0–17.0)
MCHC: 33.9 g/dL (ref 30.0–36.0)
RBC: 3.35 MIL/uL — ABNORMAL LOW (ref 4.22–5.81)
WBC: 6.9 10*3/uL (ref 4.0–10.5)

## 2012-09-25 LAB — HEMOGLOBIN A1C
Hgb A1c MFr Bld: 6.1 % — ABNORMAL HIGH (ref <5.7)
Mean Plasma Glucose: 128 mg/dL — ABNORMAL HIGH (ref <117)

## 2012-09-25 LAB — GLUCOSE, CAPILLARY: Glucose-Capillary: 92 mg/dL (ref 70–99)

## 2012-09-25 MED ORDER — LORAZEPAM 2 MG/ML IJ SOLN
INTRAMUSCULAR | Status: AC
  Filled 2012-09-25: qty 1

## 2012-09-25 MED ORDER — LORAZEPAM 2 MG/ML IJ SOLN
0.5000 mg | Freq: Four times a day (QID) | INTRAMUSCULAR | Status: DC | PRN
  Administered 2012-09-25 (×2): 1 mg via INTRAVENOUS
  Administered 2012-09-25: 0.5 mg via INTRAVENOUS
  Administered 2012-09-27 – 2012-09-30 (×5): 1 mg via INTRAVENOUS
  Filled 2012-09-25 (×8): qty 1

## 2012-09-25 MED ORDER — HALOPERIDOL LACTATE 5 MG/ML IJ SOLN
2.0000 mg | Freq: Four times a day (QID) | INTRAMUSCULAR | Status: DC | PRN
  Administered 2012-09-25 – 2012-09-26 (×2): 2 mg via INTRAVENOUS
  Filled 2012-09-25 (×2): qty 1

## 2012-09-25 MED ORDER — ATORVASTATIN CALCIUM 20 MG PO TABS
20.0000 mg | ORAL_TABLET | Freq: Every day | ORAL | Status: DC
  Administered 2012-09-25 – 2012-09-29 (×5): 20 mg via ORAL
  Filled 2012-09-25 (×7): qty 1

## 2012-09-25 MED ORDER — SODIUM CHLORIDE 0.9 % IV SOLN
INTRAVENOUS | Status: AC
  Administered 2012-09-25: 21:00:00 via INTRAVENOUS

## 2012-09-25 NOTE — Progress Notes (Signed)
At this time, after he had quieted down, he suddenly cries out and grasps his foley catheter, actively attempting to pull it out.  This happened in the presence of our tech.Aram Beecham, who immediately called for help. As I and our C.N. Heather arrive simultaneously, we grasp his hands before he removes the foley. It is rather a struggle to get him to remove his hands from the foley tubing, and after this struggle, we note skin-tears on the dorsum of both hands, to which we apply sterile Bacitracin dressings.  Our A.C., Joni Reining is notified of need for safety sitter, which we receive.

## 2012-09-25 NOTE — Progress Notes (Signed)
I had notified Dr. Annabell Howells several minutes ago of gross hematuria and received an order from him to irrigate foley, which I do.  I am unable to receive any return, and I have just notified Dr. Annabell Howells of this. Per his instruction I will attempt to reposition foley.  Will monitor.

## 2012-09-25 NOTE — Progress Notes (Signed)
Order obtained for coude catheter to be inserted. Lira,RN, 4th floor RN attempted to insert 16Fr coude catheter and  a 16Fr in and out cath and met resistance and was unable to insert. Will notify T. Callahan,NP

## 2012-09-25 NOTE — Progress Notes (Signed)
HPI  Pt presents to the clinic today with c/o worsening cough, sore throat and fatigue. He was seen for the same thing 3 days ago by Dr. Posey Rea. He was given a z pack for a URI. He is not getting better. He is actually feeling worse. He has no energy whatsoever. He is not coughing anything up. He denies fever, chills or body aches. His wife is concerned that he may have a pneumonia.  Review of Systems      Past Medical History  Diagnosis Date  . Carotid artery stenosis   . Gait disturbance   . Anxiety   . GERD (gastroesophageal reflux disease)   . Vitamin B12 deficiency   . Depression   . Osteoarthritis   . Diabetes mellitus, type 2   . Hypertension   . Vitamin D deficiency   . Tremor   . Aortic stenosis   . Hyperlipidemia   . Diverticulosis   . Adenomatous polyp of colon     Family History  Problem Relation Age of Onset  . Diabetes Father   . Colon cancer Brother     History   Social History  . Marital Status: Married    Spouse Name: N/A    Number of Children: N/A  . Years of Education: N/A   Occupational History  . Not on file.   Social History Main Topics  . Smoking status: Former Smoker    Types: Cigars  . Smokeless tobacco: Never Used  . Alcohol Use: No  . Drug Use: No  . Sexually Active: Not Currently   Other Topics Concern  . Not on file   Social History Narrative   No Parkinson's disease per Dr Vickey Huger 2009   ? Organic brain syndrome - Dr Tawni Pummel at [facility] 2010         Social History   Married '53, 6 yrs - divorced; Married '68   2 sons '54, '55; 1 daughter '58   Work: Airline pilot - telephone equiptment   Lives with wife - I-ADLs         FAMILY    History of hypertension          Allergies  Allergen Reactions  . Citalopram Hydrobromide     REACTION: "crazy"  . Duloxetine     REACTION: did not like     Constitutional: Positive headache, fatigue. Denies fever or abrupt weight changes.  HEENT:  Positive sore throat. Denies eye  redness, eye pain, pressure behind the eyes, facial pain, nasal congestion, ear pain, ringing in the ears, wax buildup, runny nose or bloody nose. Respiratory: Positive cough. Denies difficulty breathing or shortness of breath.  Cardiovascular: Denies chest pain, chest tightness, palpitations or swelling in the hands or feet.   No other specific complaints in a complete review of systems (except as listed in HPI above).  Objective:   BP 102/62  Pulse 75  Temp(Src) 98.2 F (36.8 C) (Oral)  Ht 5\' 4"  (1.626 m)  Wt 136 lb (61.689 kg)  BMI 23.33 kg/m2  SpO2 95% Wt Readings from Last 3 Encounters:  09/25/12 142 lb 10.2 oz (64.7 kg)  09/23/12 136 lb (61.689 kg)  09/20/12 136 lb (61.689 kg)     General: Appears his stated age, well developed, well nourished in NAD. HEENT: Head: normal shape and size; Eyes: sclera white, no icterus, conjunctiva pink, PERRLA and EOMs intact; Ears: Tm's gray and intact, normal light reflex; Nose: mucosa pink and moist, septum midline; Throat/Mouth: + PND. Teeth  present, mucosa erythematous and moist, no exudate noted, no lesions or ulcerations noted.  Neck: Mild cervical lymphadenopathy. Neck supple, trachea midline. No massses, lumps or thyromegaly present.  Cardiovascular: Normal rate and rhythm. S1,S2 noted.  No murmur, rubs or gallops noted. No JVD or BLE edema. No carotid bruits noted. Pulmonary/Chest: Increased effort and diminished in the RLL. No respiratory distress. No wheezes, rales or ronchi noted.      Assessment & Plan:   Acute Bronchitis, unresolved with additional workup required:  Get some rest and drink plenty of water Do salt water gargles for the sore throat eRx for Levaquin x 7 days eRx for Hycodan cough syrup  Chest xray to r/o pneumonia  RTC as needed or if symptoms persist.

## 2012-09-25 NOTE — Progress Notes (Signed)
PT NOTE:  PT eval deferred.  Pt currently agitated and wanting to call 911.  Nursing in room attempting to calm pt.  Will follow.

## 2012-09-25 NOTE — Progress Notes (Signed)
The order for simvastatin(Zocor) was changed to an equivalent dose of atorvastatin(Lipitor) due to the potential drug interaction with __amlodipine______.  When taken in combination with medications that inhibit its metabolism, simvastatin can accumulate which increases the risk of liver toxicity, myopathy, or rhabdomyolysis.  Simvastatin dose should not exceed 10mg /day in patients taking verapamil, diltiazem, fibrates, or niacin >or= 1g/day.   Simvastatin dose should not exceed 20mg /day in patients taking amlodipine, ranolazine or amiodarone.   Please consider this potential interaction at discharge.  Gwen Her 09/25/2012 1:40 PM

## 2012-09-25 NOTE — Progress Notes (Addendum)
Pt attempted to void standing up at the bedside multiple times and was only able to void 40cc of urine. Pt felt the urge to go but was unable to void. Pt was bladder scanned and 590cc of urine resulted on the scanner. Benedetto Coons called and notified.

## 2012-09-25 NOTE — Progress Notes (Signed)
Utilization Review Completed.Jera Headings T3/30/2014  

## 2012-09-25 NOTE — Progress Notes (Signed)
Notified T. Claiborne Billings, NP of inability to insert catheter. Pt attempted to urinate again standing up at the bedside but was only able to void 20cc. Urology to be consulted for catheter insertion.

## 2012-09-25 NOTE — Progress Notes (Signed)
He has become suddenly very paranoid and very loud, insisting that we "call 911".  Our C.N. Arrives, and he reiterates to her his desire to call 911, and acuses her of "being in on it with all of them".  He is in no distress, with his skin being normal, warm and dry, and he is breathing normally. Security is speaking with him as I write this per his request. IV anxiety med. Has just been given.

## 2012-09-25 NOTE — Progress Notes (Signed)
[facility] PROGRESS NOTE  [name] ZOX:[id number] DOB: [birth year] DOA: 09/24/2012 PCP: Sonda Primes, MD  Assessment/Plan:  CAP (community acquired pneumonia)  Continue Levaquin IV daily  Follow up blood culture, respiratory culture, legionella, pneumococcal ag  Urinary retention Coude catheter placed per URology early am Greatly appreciate assistance  Diabetes  Continue glipizide and metformin , SSI FU Hb A1c  HYPERLIPIDEMIA  Continue simvastatin  HYPERTENSION  Continue home meds: Norvasc, atenolol, diovan  ANXIETY AND DEPRESSION  Continue Clonazepam  Dementia  Continue aricept For agitation, Ativan PRN VITAMIN B12 and B6 DEFICIENCY  Continue B12 and B6 medication supplements per home doasge   Code Status: FULL Family Communication:none at bedside, will call wife Disposition Plan: Home with wife when better   Antibiotics:  IV levaquin 3/29  HPI/Subjective: Breathing ok,   Objective: Filed Vitals:   09/25/12 0900 09/25/12 0905 09/25/12 0945 09/25/12 1331  BP:    139/72  Pulse:    73  Temp:    98.4 F (36.9 C)  TempSrc:    Oral  Resp:    16  Height:      Weight:      SpO2: 91% 95% 96% 94%    Intake/Output Summary (Last 24 hours) at 09/25/12 1540 Last data filed at 09/25/12 0400  Gross per 24 hour  Intake      0 ml  Output    565 ml  Net   -565 ml   Filed Weights   09/24/12 2111 09/25/12 0600  Weight: 62 kg (136 lb 11 oz) 64.7 kg (142 lb 10.2 oz)    Exam:   General:  AAOx to self and place  Cardiovascular: S1S2/RRR  Respiratory: Ronchi at bases  Abdomen: soft, NT, BS present  Ext: no edema c/c  Neuro: non focal  Data Reviewed: Basic Metabolic Panel:  Recent Labs Lab 09/24/12 1917 09/24/12 2144 09/25/12 0524  NA 141 140 140  K 4.2 4.2 4.0  CL 105 105 105  CO2  --  29 26  GLUCOSE 142* 132* 75  BUN 25* 23 21  CREATININE 1.20 1.01 0.90  CALCIUM  --  9.4 9.1  MG  --  1.7  --   PHOS  --  3.2  --    Liver  Function Tests:  Recent Labs Lab 09/24/12 2144 09/25/12 0524  AST 12 13  ALT 10 9  ALKPHOS 54 49  BILITOT 0.5 0.5  PROT 7.2 6.9  ALBUMIN 2.9* 2.7*   No results found for this basename: LIPASE, AMYLASE,  in the last 168 hours No results found for this basename: AMMONIA,  in the last 168 hours CBC:  Recent Labs Lab 09/24/12 1910 09/24/12 1917 09/24/12 2144 09/25/12 0524  WBC 12.6*  --  9.0 6.9  NEUTROABS 10.3*  --  6.8  --   HGB 12.8* 13.3 12.2* 11.3*  HCT 37.9* 39.0 35.8* 33.3*  MCV 99.5  --  99.2 99.4  PLT 221  --  210 190   Cardiac Enzymes: No results found for this basename: CKTOTAL, CKMB, CKMBINDEX, TROPONINI,  in the last 168 hours BNP (last 3 results) No results found for this basename: PROBNP,  in the last 8760 hours CBG:  Recent Labs Lab 09/24/12 1844 09/24/12 2113 09/25/12 0734  GLUCAP 143* 130* 92    No results found for this or any previous visit (from the past 240 hour(s)).   Studies: Dg Chest 2 View  09/23/2012  *RADIOLOGY REPORT*  Clinical Data: Chest pain  and cough  CHEST - 2 VIEW  Comparison: 09/06/2011  Findings: The cardiac shadow is stable.  The left lung remains clear.  Increased density is noted in the right lung base projecting in the right middle lobe consistent with acute pneumonic infiltrate.  IMPRESSION: Right basilar infiltrate.   Original Report Authenticated By: Alcide Clever, M.D.     Scheduled Meds: . amLODipine  10 mg Oral Daily  . aspirin EC  81 mg Oral Daily  . atenolol  25 mg Oral BID  . atorvastatin  20 mg Oral QHS  . cholecalciferol  1,000 Units Oral Daily  . [START ON 09/26/2012] cyanocobalamin  1,000 mcg Intramuscular Q14 Days  . donepezil  5 mg Oral QHS  . glipiZIDE  10 mg Oral Q breakfast  . hydrochlorothiazide  12.5 mg Oral Daily  . irbesartan  300 mg Oral Daily  . levofloxacin (LEVAQUIN) IV  750 mg Intravenous Q48H  . metFORMIN  1,000 mg Oral Q breakfast  . metFORMIN  500 mg Oral Q supper  . vitamin B-6  25 mg  Oral Daily  . vitamin C  500 mg Oral Daily  . zolpidem  5 mg Oral QHS   Continuous Infusions: . sodium chloride 75 mL/hr at 09/25/12 0907    Principal Problem:   Acute respiratory failure with hypoxia Active Problems:   VITAMIN B12 DEFICIENCY   HYPERLIPIDEMIA   ANXIETY   DEPRESSION   HYPERTENSION   Dementia   CAP (community acquired pneumonia)   Leukocytosis   Diabetes   Nausea and vomiting    Time spent:    [facility]  [facility] Pager [phone]. If 7PM-7AM, please contact night-coverage at [email or web address], password [facility] 09/25/2012, 3:40 PM  LOS: 1 day

## 2012-09-25 NOTE — Progress Notes (Signed)
In and out cath attempted by two nurses and resistance met during insertion. Unable to insert. T. Callahan,NP notified.

## 2012-09-25 NOTE — Consult Note (Signed)
Urology Consult  Referring physician: Dr Zannie Cove Reason for referral: catheter insertion  Chief Complaint: Inability to urinate  History of Present Illness: Mr. Michel is a 79 years old male who was admitted on 329 for nausea vomiting shortness of breath. He was found in the emergency room to be hypoxic. He started having difficulty voiding last night. The nurses were unable to insert a Foley catheter in the bladder. He had a previous TURP in [facility]. He states that he was voiding well until last night. The penis was prepped with Betadine solution. I instilled 10 cc of Xylocaine jelly in the urethra and passed the flexible cystoscope in the urethra and period there was some edema at the membranous urethra but I was able to pass the cystoscope in the bladder. The bladder neck is open. I then removed the cystoscope and attempted to pass a #18 Council catheter in the bladder. I met some resistance at the posterior urethra. I then reinserted the cystoscope in the bladder and passed a sensor wire through the cystoscope in the bladder. Then the #18 Council catheter was passed over the sensor wire without difficulty in the bladder. About 650 cc of urine were drained out of the bladder. The catheter was left to straight drainage.  Past Medical History  Diagnosis Date  . Carotid artery stenosis   . Gait disturbance   . Anxiety   . GERD (gastroesophageal reflux disease)   . Vitamin B12 deficiency   . Depression   . Osteoarthritis   . Diabetes mellitus, type 2   . Hypertension   . Vitamin D deficiency   . Tremor   . Aortic stenosis   . Hyperlipidemia   . Diverticulosis   . Adenomatous polyp of colon    Past Surgical History  Procedure Laterality Date  . Vasectomy    . Lumbar laminectomy      L5 Dr Ophelia Charter  . Transurethral resection of prostate  March 2007    Medications: Amlodipine, aspirin, Tenormin, Vit D3, Klonopin, Vit B12, ricept, Glipizide,, Metformin, K-Dur,Pyridoxine, Zocor,  Diovan Allergies:  Allergies  Allergen Reactions  . Citalopram Hydrobromide     REACTION: "crazy"  . Duloxetine     REACTION: did not like    Family History  Problem Relation Age of Onset  . Diabetes Father   . Colon cancer Brother    Social History:  reports that he has quit smoking. His smoking use included Cigars. He has never used smokeless tobacco. He reports that he does not drink alcohol or use illicit drugs.  ROS: All systems are reviewed and negative except as noted.  Physical Exam:  Vital signs in last 24 hours: Temp:  [98.1 F (36.7 C)-98.7 F (37.1 C)] 98.3 F (36.8 C) (03/29 2111) Pulse Rate:  [80-83] 83 (03/29 2111) Resp:  [15-18] 18 (03/29 2111) BP: (129-146)/(71-76) 146/71 mmHg (03/29 2111) SpO2:  [96 %-97 %] 96 % (03/29 2111) Weight:  [136 lb 11 oz (62 kg)] 136 lb 11 oz (62 kg) (03/29 2111) HEENT: Normal Cardiovascular: Skin warm; not flushed Respiratory: Breaths quiet; no shortness of breath Abdomen: No masses Neurological: Normal sensation to touch Musculoskeletal: Normal motor function arms and legs Lymphatics: No inguinal adenopathy Skin: No rashes Genitourinary:Penis is circumcised.  Meatus is normal. Scrotum is unremarkable.  Testicles are normal.  Laboratory Data:  Results for orders placed during the hospital encounter of 09/24/12 (from the past 72 hour(s))  GLUCOSE, CAPILLARY     Status: Abnormal   Collection  Time    09/24/12  6:44 PM      Result Value Range   Glucose-Capillary 143 (*) 70 - 99 mg/dL   Comment 1 Documented in Chart     Comment 2 Notify RN    CBC WITH DIFFERENTIAL     Status: Abnormal   Collection Time    09/24/12  7:10 PM      Result Value Range   WBC 12.6 (*) 4.0 - 10.5 K/uL   RBC 3.81 (*) 4.22 - 5.81 MIL/uL   Hemoglobin 12.8 (*) 13.0 - 17.0 g/dL   HCT 16.1 (*) 09.6 - 04.5 %   MCV 99.5  78.0 - 100.0 fL   MCH 33.6  26.0 - 34.0 pg   MCHC 33.8  30.0 - 36.0 g/dL   RDW 40.9  81.1 - 91.4 %   Platelets 221  150 - 400  K/uL   Neutrophils Relative 82 (*) 43 - 77 %   Lymphocytes Relative 11 (*) 12 - 46 %   Monocytes Relative 7  3 - 12 %   Eosinophils Relative 0  0 - 5 %   Basophils Relative 0  0 - 1 %   Neutro Abs 10.3 (*) 1.7 - 7.7 K/uL   Lymphs Abs 1.4  0.7 - 4.0 K/uL   Monocytes Absolute 0.9  0.1 - 1.0 K/uL   Eosinophils Absolute 0.0  0.0 - 0.7 K/uL   Basophils Absolute 0.0  0.0 - 0.1 K/uL   Smear Review MORPHOLOGY UNREMARKABLE    POCT I-STAT, CHEM 8     Status: Abnormal   Collection Time    09/24/12  7:17 PM      Result Value Range   Sodium 141  135 - 145 mEq/L   Potassium 4.2  3.5 - 5.1 mEq/L   Chloride 105  96 - 112 mEq/L   BUN 25 (*) 6 - 23 mg/dL   Creatinine, Ser 7.82  0.50 - 1.35 mg/dL   Glucose, Bld 956 (*) 70 - 99 mg/dL   Calcium, Ion 2.13  0.86 - 1.30 mmol/L   TCO2 29  0 - 100 mmol/L   Hemoglobin 13.3  13.0 - 17.0 g/dL   HCT 57.8  46.9 - 62.9 %  GLUCOSE, CAPILLARY     Status: Abnormal   Collection Time    09/24/12  9:13 PM      Result Value Range   Glucose-Capillary 130 (*) 70 - 99 mg/dL  COMPREHENSIVE METABOLIC PANEL     Status: Abnormal   Collection Time    09/24/12  9:44 PM      Result Value Range   Sodium 140  135 - 145 mEq/L   Potassium 4.2  3.5 - 5.1 mEq/L   Chloride 105  96 - 112 mEq/L   CO2 29  19 - 32 mEq/L   Glucose, Bld 132 (*) 70 - 99 mg/dL   BUN 23  6 - 23 mg/dL   Creatinine, Ser 5.28  0.50 - 1.35 mg/dL   Calcium 9.4  8.4 - 41.3 mg/dL   Total Protein 7.2  6.0 - 8.3 g/dL   Albumin 2.9 (*) 3.5 - 5.2 g/dL   AST 12  0 - 37 U/L   ALT 10  0 - 53 U/L   Alkaline Phosphatase 54  39 - 117 U/L   Total Bilirubin 0.5  0.3 - 1.2 mg/dL   GFR calc non Af Amer 66 (*) >90 mL/min   GFR calc Af Amer 76 (*) >  90 mL/min   Comment:            The eGFR has been calculated     using the CKD EPI equation.     This calculation has not been     validated in all clinical     situations.     eGFR's persistently     <90 mL/min signify     possible Chronic Kidney Disease.   MAGNESIUM     Status: None   Collection Time    09/24/12  9:44 PM      Result Value Range   Magnesium 1.7  1.5 - 2.5 mg/dL  PHOSPHORUS     Status: None   Collection Time    09/24/12  9:44 PM      Result Value Range   Phosphorus 3.2  2.3 - 4.6 mg/dL  CBC WITH DIFFERENTIAL     Status: Abnormal   Collection Time    09/24/12  9:44 PM      Result Value Range   WBC 9.0  4.0 - 10.5 K/uL   RBC 3.61 (*) 4.22 - 5.81 MIL/uL   Hemoglobin 12.2 (*) 13.0 - 17.0 g/dL   HCT 16.1 (*) 09.6 - 04.5 %   MCV 99.2  78.0 - 100.0 fL   MCH 33.8  26.0 - 34.0 pg   MCHC 34.1  30.0 - 36.0 g/dL   RDW 40.9  81.1 - 91.4 %   Platelets 210  150 - 400 K/uL   Neutrophils Relative 75  43 - 77 %   Neutro Abs 6.8  1.7 - 7.7 K/uL   Lymphocytes Relative 17  12 - 46 %   Lymphs Abs 1.5  0.7 - 4.0 K/uL   Monocytes Relative 8  3 - 12 %   Monocytes Absolute 0.8  0.1 - 1.0 K/uL   Eosinophils Relative 0  0 - 5 %   Eosinophils Absolute 0.0  0.0 - 0.7 K/uL   Basophils Relative 0  0 - 1 %   Basophils Absolute 0.0  0.0 - 0.1 K/uL  APTT     Status: None   Collection Time    09/24/12  9:44 PM      Result Value Range   aPTT 32  24 - 37 seconds  PROTIME-INR     Status: None   Collection Time    09/24/12  9:44 PM      Result Value Range   Prothrombin Time 13.1  11.6 - 15.2 seconds   INR 1.00  0.00 - 1.49  STREP PNEUMONIAE URINARY ANTIGEN     Status: None   Collection Time    09/24/12 11:05 PM      Result Value Range   Strep Pneumo Urinary Antigen NEGATIVE  NEGATIVE   Comment: PERFORMED AT [facility]                Infection due to S. pneumoniae     cannot be absolutely ruled out     since the antigen present     may be below the detection limit     of the test.   No results found for this or any previous visit (from the past 240 hour(s)). Creatinine:  Recent Labs  09/24/12 1917 09/24/12 2144  CREATININE 1.20 1.01    Xrays: See report/chart   Impression/Assessment:  Urinary retention   BPH  Plan:  Leave Foley indwelling. Voiding trial when patient is stable.  Skyy Mcknight-HENRY 09/25/2012, 3:29 AM

## 2012-09-26 ENCOUNTER — Telehealth: Payer: Self-pay | Admitting: Internal Medicine

## 2012-09-26 ENCOUNTER — Ambulatory Visit: Payer: MEDICARE | Admitting: Physical Therapy

## 2012-09-26 ENCOUNTER — Inpatient Hospital Stay (HOSPITAL_COMMUNITY): Payer: MEDICARE

## 2012-09-26 DIAGNOSIS — F0390 Unspecified dementia without behavioral disturbance: Secondary | ICD-10-CM

## 2012-09-26 DIAGNOSIS — E119 Type 2 diabetes mellitus without complications: Secondary | ICD-10-CM

## 2012-09-26 LAB — URINE CULTURE: Colony Count: 9000

## 2012-09-26 LAB — GLUCOSE, CAPILLARY

## 2012-09-26 NOTE — Telephone Encounter (Signed)
Noted  

## 2012-09-26 NOTE — Progress Notes (Signed)
[facility] PROGRESS NOTE  [name] AVW:[id number] DOB: Aug 15, 1924 DOA: 09/24/2012 PCP: Sonda Primes, MD  Assessment/Plan:  Aspiration vs Community acquired pneumonia Continue Levaquin IV daily  FU respiratory culture, Urine legionella, pneumococcal antigen negative Swallow eval  Urinary retention/hematuria Coude catheter placed per Urology early am Greatly appreciate assistance Monitor Urine output  Diabetes  Continue glipizide and metformin , SSI Hb A1c6.1  HYPERLIPIDEMIA  Continue simvastatin  HYPERTENSION  Continue home meds: Norvasc, atenolol, diovan  ANXIETY AND DEPRESSION  Continue Clonazepam  Dementia  Continue aricept For agitation, Ativan PRN  VITAMIN B12 and B6 DEFICIENCY  Continue B12 and B6 medication supplements per home doasge   Code Status: FULL Family Communication: none at bedside Disposition Plan: Home with wife when better   Antibiotics:  IV levaquin 3/29  HPI/Subjective: Breathing ok,   Objective: Filed Vitals:   09/25/12 0945 09/25/12 1331 09/25/12 2200 09/26/12 0600  BP:  139/72 145/73 168/73  Pulse:  73 66 68  Temp:  98.4 F (36.9 C) 98.5 F (36.9 C) 98 F (36.7 C)  TempSrc:  Oral Oral Oral  Resp:  16 18 18   Height:      Weight:    67.3 kg (148 lb 5.9 oz)  SpO2: 96% 94% 97% 91%    Intake/Output Summary (Last 24 hours) at 09/26/12 1243 Last data filed at 09/26/12 0700  Gross per 24 hour  Intake    120 ml  Output    650 ml  Net   -530 ml   Filed Weights   09/24/12 2111 09/25/12 0600 09/26/12 0600  Weight: 62 kg (136 lb 11 oz) 64.7 kg (142 lb 10.2 oz) 67.3 kg (148 lb 5.9 oz)    Exam:   General:  AAOx to self and place  Cardiovascular: S1S2/RRR  Respiratory: Ronchi at bases  Abdomen: soft, NT, BS present  Ext: no edema c/c  Neuro: non focal  Data Reviewed: Basic Metabolic Panel:  Recent Labs Lab 09/24/12 1917 09/24/12 2144 09/25/12 0524  NA 141 140 140  K 4.2 4.2 4.0  CL 105 105  105  CO2  --  29 26  GLUCOSE 142* 132* 75  BUN 25* 23 21  CREATININE 1.20 1.01 0.90  CALCIUM  --  9.4 9.1  MG  --  1.7  --   PHOS  --  3.2  --    Liver Function Tests:  Recent Labs Lab 09/24/12 2144 09/25/12 0524  AST 12 13  ALT 10 9  ALKPHOS 54 49  BILITOT 0.5 0.5  PROT 7.2 6.9  ALBUMIN 2.9* 2.7*   No results found for this basename: LIPASE, AMYLASE,  in the last 168 hours No results found for this basename: AMMONIA,  in the last 168 hours CBC:  Recent Labs Lab 09/24/12 1910 09/24/12 1917 09/24/12 2144 09/25/12 0524  WBC 12.6*  --  9.0 6.9  NEUTROABS 10.3*  --  6.8  --   HGB 12.8* 13.3 12.2* 11.3*  HCT 37.9* 39.0 35.8* 33.3*  MCV 99.5  --  99.2 99.4  PLT 221  --  210 190   Cardiac Enzymes: No results found for this basename: CKTOTAL, CKMB, CKMBINDEX, TROPONINI,  in the last 168 hours BNP (last 3 results) No results found for this basename: PROBNP,  in the last 8760 hours CBG:  Recent Labs Lab 09/24/12 1844 09/24/12 2113 09/25/12 0734 09/26/12 0737  GLUCAP 143* 130* 92 88    No results found for this or any previous visit (  from the past 240 hour(s)).   Studies: No results found.  Scheduled Meds: . amLODipine  10 mg Oral Daily  . aspirin EC  81 mg Oral Daily  . atenolol  25 mg Oral BID  . atorvastatin  20 mg Oral QHS  . cholecalciferol  1,000 Units Oral Daily  . cyanocobalamin  1,000 mcg Intramuscular Q14 Days  . donepezil  5 mg Oral QHS  . glipiZIDE  10 mg Oral Q breakfast  . hydrochlorothiazide  12.5 mg Oral Daily  . irbesartan  300 mg Oral Daily  . levofloxacin (LEVAQUIN) IV  750 mg Intravenous Q48H  . metFORMIN  1,000 mg Oral Q breakfast  . metFORMIN  500 mg Oral Q supper  . vitamin B-6  25 mg Oral Daily  . vitamin C  500 mg Oral Daily  . zolpidem  5 mg Oral QHS   Continuous Infusions:    Principal Problem:   Acute respiratory failure with hypoxia Active Problems:   VITAMIN B12 DEFICIENCY   HYPERLIPIDEMIA   ANXIETY    DEPRESSION   HYPERTENSION   Dementia   CAP (community acquired pneumonia)   Leukocytosis   Diabetes   Nausea and vomiting   Urinary retention    Time spent:    [facility]  [facility] Pager [phone]. If 7PM-7AM, please contact night-coverage at [email or web address], password [facility] 09/26/2012, 12:43 PM  LOS: 2 days

## 2012-09-26 NOTE — Progress Notes (Signed)
Speech Language Pathology Dysphagia Treatment Patient Details Name: [name] MRN: [id number] DOB: [birth year] Today's Date: 09/26/2012 Time: 4098-1191 SLP Time Calculation (min): 16 min  Assessment / Plan / Recommendation Clinical Impression  Skilled dysphagia treatment session completed to educate pt and spouse to findings of BSE and MBS and to clinical reasoning for swallow compensatory strategies.  SLP  provided spouse with diagram and written compensatory strategies to mitigate aspiration risk.  She denies pt having problems swallowing or coughing with po prior to admission.  She does admit pt with 60 pound weight loss over several years.  SLP used teach back to assure spouse understood findings, recommendations.     Further advised spouse to known the heimlich manuever for emergent use, to which pt replied "We've been talking about that for years."   SLP to continue to follow for maximal airway protection.      Diet Recommendation  Continue with Current Diet: Regular;Thin liquid    SLP Plan Continue with current plan of care   Pertinent Vitals/Pain Afebrile, weak cough   Swallowing Goals  SLP Swallowing Goals Swallow Study Goal #2 - Progress: Progressing toward goal Swallow Study Goal #3 - Progress: Progressing toward goal  General Temperature Spikes Noted: No Respiratory Status: Supplemental O2 delivered via (comment) Behavior/Cognition: Alert;Cooperative;Impulsive;Decreased sustained attention Oral Cavity - Dentition: Adequate natural dentition Patient Positioning: Upright in bed  Oral Cavity - Oral Hygiene   oral cavity clear  Dysphagia Treatment Treatment focused on: Patient/family/caregiver education Family/Caregiver Educated: spouse  Patient observed directly with PO's: No Reason PO's not observed:  (education session with spouse as she missed previous evals)   GO     Donavan Burnet, MS [facility] SLP [phone]

## 2012-09-26 NOTE — Evaluation (Signed)
Clinical/Bedside Swallow Evaluation Patient Details  Name: [name] MRN: [id number] Date of Birth: 1925-04-09  Today's Date: 09/26/2012 Time: 4098-1191 SLP Time Calculation (min): 30 min  Past Medical History:  Past Medical History  Diagnosis Date  . Carotid artery stenosis   . Gait disturbance   . Anxiety   . GERD (gastroesophageal reflux disease)   . Vitamin B12 deficiency   . Depression   . Osteoarthritis   . Diabetes mellitus, type 2   . Hypertension   . Vitamin D deficiency   . Tremor   . Aortic stenosis   . Hyperlipidemia   . Diverticulosis   . Adenomatous polyp of colon    Past Surgical History:  Past Surgical History  Procedure Laterality Date  . Vasectomy    . Lumbar laminectomy      L5 Dr Ophelia Charter  . Transurethral resection of prostate  March 2007   HPI:  79 yo male admitted to [facility] with cough and weakness.   PMH + for B12 deficiency, depression, HTN, CAP, leukocytosis, N/V, DM.  Order received for bedside swallow evaluation.  CXR 09/23/12 showed right basilar infiltrate, CT head negative.  Pt denies dysphagia but states food doesn't taste good. Concern for aspiration present given Rt LL pna.    Assessment / Plan / Recommendation Clinical Impression  Pt presents with clinical indicators concerning for oropharyngeal dysphagia and aspiration.  Weak nonproductive cough noted at baseline that significantly worsens with po intake.  Pt reports weight loss of sixty pounds but he did not indicate timeframe.  He also states food tastes bad.   Pt required redirection to taks as he stated that people are trying to kill him or get at his money.     Oral cavity with retained ? Crushed pill without pt awareness.  Small amount of tea aided clearance but with overt cough after tea.  Decreased oral bolus manipulation, delayed swallow and overt coughing noted while pt was eating minimal amount he would accept.  Concern for overt aspiration present and pt was fully alert, and  participated in evaluation.     Advise MBS to rule out OVERT aspiration and to help determine diet/strategies that may mitigate pt's aspiration risk. MD please order if you agree.   Pt agreeable to plan.     Aspiration Risk  Severe    Diet Recommendation NPO   Medication Administration: Crushed with puree (needed medicines pending MBS)    Other  Recommendations Recommended Consults: MBS   Follow Up Recommendations    TBD   Frequency and Duration   TBD     Pertinent Vitals/Pain Afebrile, decreased-congested cough    SLP Swallow Goals Patient will utilize recommended strategies during swallow to increase swallowing safety with: Total assistance Goal #3: Pt's family will verbalize precautions to mitigate aspiration risk with mod assistance.    Swallow Study Prior Functional Status  Type of Home: House Lives With: Spouse    General Date of Onset: 09/26/12 HPI: 79 yo male admitted to [facility] with cough and weakness.   PMH + for B12 deficiency, depression, HTN, CAP, leukocytosis, N/V, DM.  Order received for bedside swallow evaluation.  CXR 09/23/12 showed right basilar infiltrate, CT head negative.   Type of Study: Bedside swallow evaluation Previous Swallow Assessment: none in echart Diet Prior to this Study: Regular;Thin liquids Temperature Spikes Noted: No Respiratory Status: Supplemental O2 delivered via (comment) Behavior/Cognition: Alert;Doesn't follow directions;Agitated;Requires cueing;Uncooperative (follows directions intermittently) Oral Cavity - Dentition: Adequate natural dentition Self-Feeding Abilities:  Total assist Patient Positioning: Upright in bed Baseline Vocal Quality: Low vocal intensity;Breathy Volitional Cough: Weak (nonproductive) Volitional Swallow: Able to elicit    Oral/Motor/Sensory Function Overall Oral Motor/Sensory Function: Impaired (generalized weak, no focal CN deficits, coating tongue/?pill)   Ice Chips Ice chips: Not tested   Thin Liquid Thin  Liquid: Not tested    Nectar Thick Nectar Thick Liquid: Impaired Presentation: Cup;Self Fed Oral Phase Impairments: Impaired anterior to posterior transit;Reduced lingual movement/coordination Oral phase functional implications: Prolonged oral transit Pharyngeal Phase Impairments: Suspected delayed Swallow;Cough - Delayed   Honey Thick Honey Thick Liquid: Not tested   Puree Puree: Not tested   Solid   GO    Solid: Impaired Oral Phase Impairments: Impaired anterior to posterior transit;Reduced lingual movement/coordination Pharyngeal Phase Impairments: Suspected delayed Swallow;Decreased hyoid-laryngeal movement;Cough - Delayed      Donavan Burnet, MS [facility] SLP [phone]

## 2012-09-26 NOTE — Evaluation (Signed)
Physical Therapy Evaluation Patient Details Name: [name] MRN: [id number] DOB: 16-Aug-1924 Today's Date: 09/26/2012 Time: 4098-1191 PT Time Calculation (min): 16 min  PT Assessment / Plan / Recommendation Clinical Impression  79 yo male admitted with acute respiratory failure with hypoxia. On eval, pt required Min assist for mobility. Pt was somewhat confused during session. No family present during eval. Recommend HHPT vs SNF, depending on progress and assist level at home.     PT Assessment  Patient needs continued PT services    Follow Up Recommendations  Home health PT vs SNF (depending on progress)    Does the patient have the potential to tolerate intense rehabilitation      Barriers to Discharge        Equipment Recommendations   (DME needs to be confirmed)    Recommendations for Other Services OT consult   Frequency Min 3X/week    Precautions / Restrictions Precautions Precautions: Fall Restrictions Weight Bearing Restrictions: No   Pertinent Vitals/Pain       Mobility  Bed Mobility Bed Mobility: Supine to Sit Supine to Sit: 6: Modified independent (Device/Increase time) Details for Bed Mobility Assistance: Increased time.  Transfers Transfers: Sit to Stand;Stand to Sit Sit to Stand: 4: Min assist;From bed Stand to Sit: 4: Min assist;To chair/3-in-1;With armrests Details for Transfer Assistance: VCs safety, technique, hand placement. Assist to rise, stabilize, control descent Ambulation/Gait Ambulation Distance (Feet): 135 Feet Assistive device: Rolling walker Ambulation/Gait Assistance Details: VCs safety, distance from RW. Assist to stabilize throughout ambulation. Pt c/o some lightheadedness.  Gait Pattern: Step-through pattern;Decreased step length - left;Decreased step length - right;Decreased stride length;Trunk flexed    Exercises     PT Diagnosis: Difficulty walking;Generalized weakness  PT Problem List: Decreased mobility;Decreased  balance;Decreased strength;Decreased activity tolerance;Decreased knowledge of use of DME PT Treatment Interventions: DME instruction;Gait training;Functional mobility training;Therapeutic activities;Therapeutic exercise;Patient/family education   PT Goals Acute Rehab PT Goals PT Goal Formulation: With patient Time For Goal Achievement: 09/26/12 Potential to Achieve Goals: Good Pt will go Sit to Stand: with supervision PT Goal: Sit to Stand - Progress: Goal set today Pt will Ambulate: >150 feet;with supervision;with least restrictive assistive device PT Goal: Ambulate - Progress: Goal set today  Visit Information  Last PT Received On: 09/26/12 Assistance Needed: +1    Subjective Data  Subjective: Where am I? Patient Stated Goal: None stated   Prior Functioning  Home Living Lives With: Spouse Type of Home: House Home Adaptive Equipment: Walker - rolling Additional Comments: pt unable to provide full history Prior Function Comments: Pt unable to provide PLOF-was unsure if wife helped him or not Communication Communication: No difficulties    Cognition  Cognition Overall Cognitive Status: Impaired Area of Impairment: Memory Arousal/Alertness: Awake/alert Orientation Level: Disoriented to;Place;Time;Situation Behavior During Session: WFL for tasks performed Memory Deficits: Pt unable to provide PLOF or home environment info    Extremity/Trunk Assessment Right Lower Extremity Assessment RLE ROM/Strength/Tone: Deficits RLE ROM/Strength/Tone Deficits: Strength at least 3+/5 with functional activity Left Lower Extremity Assessment LLE ROM/Strength/Tone: Deficits LLE ROM/Strength/Tone Deficits: Strength at least 3+/5 with functional activity   Balance    End of Session PT - End of Session Equipment Utilized During Treatment: Gait belt Activity Tolerance: Patient tolerated treatment well Patient left: in chair;with call bell/phone within reach;with nursing in room  GP      Rebeca Alert, MPT Pager: [phone]

## 2012-09-26 NOTE — Telephone Encounter (Signed)
[facility] Triage Call Report Triage Record Num: [id number] Operator: Tana Felts Patient Name: [name] Call Date & Time: 09/23/2012 10:22:39PM Patient Phone: [phone] PCP: Sonda Primes Patient Gender: Male PCP Fax : [phone] Patient DOB: 02/13/1925 Practice Name: [facility] Reason for Call: Caller: Kathrine Haddock; PCP: Sonda Primes (Adults only); CB#: [phone]; Onset: 09/23/12; office visit 09/23/12 Call regarding medication issue, unable to get Levaquin Rx that was sent to [facility] pharmacy due to other medications he is currently taking. Advised I would need to speak with the pharmacist - [facility] is now closed. Offered to use another pharmacy however she is unable to drive this late to get the Rx. She will wait until the morning and contact the pharmacy, to make sure they have contacted the office to resolve the issue. Pt is currenlty sleeping and no symptoms to triage at this time. Protocol(s) Used: Medication Questions - Adult Recommended Outcome per Protocol: Call Provider within 4 Hours Override Outcome if Used in Protocol: Call Provider within 24 Hours RN Reason for Override Outcome: No Access To Transportation Reason for Outcome: Prescription ordered today and not available at pharmacy putting patient at clinical risk

## 2012-09-26 NOTE — Procedures (Signed)
Objective Swallowing Evaluation: Modified Barium Swallowing Study  Patient Details  Name: KIAAN OVERHOLSER MRN: [id number] Date of Birth: 08-18-1924  Today's Date: 09/26/2012 Time: 1340-1405 SLP Time Calculation (min): 25 min  Past Medical History:  Past Medical History  Diagnosis Date  . Carotid artery stenosis   . Gait disturbance   . Anxiety   . GERD (gastroesophageal reflux disease)   . Vitamin B12 deficiency   . Depression   . Osteoarthritis   . Diabetes mellitus, type 2   . Hypertension   . Vitamin D deficiency   . Tremor   . Aortic stenosis   . Hyperlipidemia   . Diverticulosis   . Adenomatous polyp of colon    Past Surgical History:  Past Surgical History  Procedure Laterality Date  . Vasectomy    . Lumbar laminectomy      L5 Dr Ophelia Charter  . Transurethral resection of prostate  March 2007   HPI:  79 yo male admitted to [facility] with cough and weakness.   PMH + for B12 deficiency, depression, HTN, CAP, leukocytosis, N/V, DM.  Order received for bedside swallow evaluation.  CXR 09/23/12 showed right basilar infiltrate, CT head negative.       Assessment / Plan / Recommendation Clinical Impression  Dysphagia Diagnosis: Moderate pharyngeal phase dysphagia;Moderate oral phase dysphagia;Moderate cervical esophageal phase dysphagia  Clinical impression: Pt presents with moderate oropharyngeal and cervical esophageal dysphagia, also suspect component of esophageal deficits.  Dysphagia characterized by delayed oral transit, decr bolus cohesion resulting in piecemeal deglutition and minimal oral stasis.  Pharyngeal swallow characterized by decreased pharyngeal contraction and decreased tongue base retraction, decreased laryngeal elevation resulting in pharyngeal stasis that mixed with secretions.   Mild laryngeal penetration of nectar cleared with cued throat clear and both reflexive and cued dry swallows decreased amount of pharyngeal stasis.    Please note, pt coughed during tesitng  without barium visualized in trachea.    Pt was educated to findings of test, purpose of compensatory strategie during entire procedure.    Due to pt's weakness, multifactorial dysphagia without sensation, weak coughhe will be a chronic aspiration, strategies in place to mitigate risk.    SLP to follow for dysphagia management, family/pt education.  Thanks for the order.      Treatment Recommendation  TBD   Diet Recommendation Regular;Thin liquid   Liquid Administration via: Cup;Straw Medication Administration: Crushed with puree Supervision: Full supervision/cueing for compensatory strategies Compensations: Slow rate;Small sips/bites;Check for pocketing;Multiple dry swallows after each bite/sip;Follow solids with liquid;Clear throat intermittently (rest breaks if short of breath or coughing) Postural Changes and/or Swallow Maneuvers: Seated upright 90 degrees;Upright 30-60 min after meal    Other  Recommendations Recommended Consults: MBS Oral Care Recommendations: Oral care before and after PO   Follow Up Recommendations  Skilled Nursing facility    Frequency and Duration min 2x/week  2 weeks   Pertinent Vitals/Pain Afebrile, decreased- weak cough    SLP Swallow Goals Patient will utilize recommended strategies during swallow to increase swallowing safety with: Total assistance Swallow Study Goal #2 - Progress: Progressing toward goal Goal #3: Pt's family will verbalize precautions to mitigate aspiration risk with mod assistance.    General Date of Onset: 09/26/12 HPI: 79 yo male admitted to [facility] with cough and weakness.   PMH + for B12 deficiency, depression, HTN, CAP, leukocytosis, N/V, DM.  Order received for bedside swallow evaluation.  CXR 09/23/12 showed right basilar infiltrate, CT head negative.   Type of Study: Modified  Barium Swallowing Study Reason for Referral: Objectively evaluate swallowing function Previous Swallow Assessment: BSE today Diet Prior to this Study:  Regular;Thin liquids Temperature Spikes Noted: No Respiratory Status: Supplemental O2 delivered via (comment) Behavior/Cognition: Alert;Requires cueing;Impulsive;Decreased sustained attention (follows directions intermittently) Oral Cavity - Dentition: Adequate natural dentition Oral Motor / Sensory Function: Impaired - see Bedside swallow eval (general weakness) Self-Feeding Abilities: Total assist Patient Positioning: Upright in chair Baseline Vocal Quality: Low vocal intensity;Hoarse Volitional Cough: Weak (nonproductive) Volitional Swallow: Unable to elicit Pharyngeal Secretions: Standing secretions in (comment) (secretions mixed with barium retained throughout pharynx)    Reason for Referral Objectively evaluate swallowing function   Oral Phase Oral Preparation/Oral Phase Oral Phase: Impaired Oral - Nectar Oral - Nectar Teaspoon: Delayed oral transit;Lingual/palatal residue;Piecemeal swallowing;Weak lingual manipulation;Reduced posterior propulsion Oral - Nectar Cup: Delayed oral transit;Piecemeal swallowing;Weak lingual manipulation;Reduced posterior propulsion Oral - Thin Oral - Thin Teaspoon: Piecemeal swallowing;Delayed oral transit;Lingual pumping;Reduced posterior propulsion Oral - Thin Cup: Piecemeal swallowing;Delayed oral transit;Lingual pumping;Weak lingual manipulation;Reduced posterior propulsion Oral - Thin Straw: Piecemeal swallowing;Delayed oral transit;Lingual/palatal residue;Weak lingual manipulation;Reduced posterior propulsion Oral - Solids Oral - Puree: Reduced posterior propulsion;Piecemeal swallowing Oral - Regular: Impaired mastication;Delayed oral transit;Piecemeal swallowing   Pharyngeal Phase Pharyngeal Phase Pharyngeal Phase: Impaired Pharyngeal - Nectar Pharyngeal - Nectar Teaspoon: Reduced pharyngeal peristalsis;Premature spillage to valleculae;Pharyngeal residue - pyriform sinuses;Pharyngeal residue - valleculae Pharyngeal - Nectar Cup: Reduced  pharyngeal peristalsis;Penetration/Aspiration during swallow;Premature spillage to valleculae;Pharyngeal residue - pyriform sinuses;Pharyngeal residue - valleculae Penetration/Aspiration details (nectar cup): Material enters airway, remains ABOVE vocal cords and not ejected out (cued throat clear removed mild penetrates) Pharyngeal - Thin Pharyngeal - Thin Teaspoon: Reduced pharyngeal peristalsis;Premature spillage to valleculae;Pharyngeal residue - valleculae;Pharyngeal residue - pyriform sinuses Pharyngeal - Thin Cup: Premature spillage to valleculae;Pharyngeal residue - pyriform sinuses;Pharyngeal residue - valleculae Pharyngeal - Thin Straw: Reduced pharyngeal peristalsis;Premature spillage to valleculae;Pharyngeal residue - pyriform sinuses;Pharyngeal residue - valleculae Pharyngeal - Solids Pharyngeal - Puree: Reduced pharyngeal peristalsis;Premature spillage to valleculae;Pharyngeal residue - valleculae;Pharyngeal residue - pyriform sinuses Pharyngeal - Regular: Premature spillage to valleculae  Cervical Esophageal Phase    GO    Cervical Esophageal Phase Cervical Esophageal Phase: Impaired Cervical Esophageal Phase - Nectar Nectar Teaspoon: Reduced cricopharyngeal relaxation Cervical Esophageal Phase - Thin Thin Teaspoon: Reduced cricopharyngeal relaxation Cervical Esophageal Phase - Solids Puree: Reduced cricopharyngeal relaxation Regular: Reduced cricopharyngeal relaxation Cervical Esophageal Phase - Comment Cervical Esophageal Comment: Appearance of cervical osteophytes may further impair bolus clearance, secretions retained in pharynx without pt sensation nor ability to fully clear.  Appearance of decreased clearance distally in esophagus with tertiary contractions on intermittent basis without pt sensation, ? consistent with dysmotility, radiologist not present to confirm.  Rec pt follow esophageal precautions to maximize pt's airway protection.           Donavan Burnet,  MS [facility] SLP [phone]

## 2012-09-27 ENCOUNTER — Inpatient Hospital Stay (HOSPITAL_COMMUNITY): Payer: MEDICARE

## 2012-09-27 LAB — GLUCOSE, CAPILLARY: Glucose-Capillary: 132 mg/dL — ABNORMAL HIGH (ref 70–99)

## 2012-09-27 MED ORDER — PROSIGHT PO TABS
2.0000 | ORAL_TABLET | Freq: Every day | ORAL | Status: DC
  Administered 2012-09-27 – 2012-09-30 (×4): 2 via ORAL
  Filled 2012-09-27 (×4): qty 2

## 2012-09-27 MED ORDER — OCUVITE-LUTEIN PO CAPS
2.0000 | ORAL_CAPSULE | Freq: Every day | ORAL | Status: DC
  Filled 2012-09-27: qty 2

## 2012-09-27 MED ORDER — LEVOFLOXACIN 750 MG PO TABS
750.0000 mg | ORAL_TABLET | ORAL | Status: DC
  Filled 2012-09-27: qty 1

## 2012-09-27 MED ORDER — ALBUTEROL SULFATE (5 MG/ML) 0.5% IN NEBU
2.5000 mg | INHALATION_SOLUTION | Freq: Four times a day (QID) | RESPIRATORY_TRACT | Status: DC
  Administered 2012-09-27 (×2): 2.5 mg via RESPIRATORY_TRACT
  Filled 2012-09-27 (×3): qty 0.5

## 2012-09-27 NOTE — Progress Notes (Addendum)
ANTIBIOTIC CONSULT NOTE - FOLLOW UP  Pharmacy Consult for pharmacy to renally adjust antibiotics (levofloxacin) Indication: CAP  Allergies  Allergen Reactions  . Citalopram Hydrobromide     REACTION: "crazy"  . Duloxetine     REACTION: did not like    Patient Measurements: Height: 5\' 3"  (160 cm) Weight: 134 lb 0.6 oz (60.8 kg) IBW/kg (Calculated) : 56.9  Vital Signs: Temp: 99.1 F (37.3 C) (04/01 0612) Temp src: Oral (04/01 0612) BP: 174/70 mmHg (04/01 0612) Pulse Rate: 58 (04/01 0612) Intake/Output from previous day: 03/31 0701 - 04/01 0700 In: 240 [P.O.:240] Out: 701 [Urine:700; Stool:1] Intake/Output from this shift: Total I/O In: 240 [P.O.:240] Out: -   Labs:  Recent Labs  09/24/12 1910 09/24/12 1917 09/24/12 2144 09/25/12 0524  WBC 12.6*  --  9.0 6.9  HGB 12.8* 13.3 12.2* 11.3*  PLT 221  --  210 190  CREATININE  --  1.20 1.01 0.90   Estimated Creatinine Clearance: 48.3 ml/min (by C-G formula based on Cr of 0.9).  Microbiology: Recent Results (from the past 720 hour(s))  URINE CULTURE     Status: None   Collection Time    09/24/12 11:05 PM      Result Value Range Status   Specimen Description URINE, CLEAN CATCH   Final   Special Requests NONE   Final   Culture  Setup Time 09/25/2012 16:05   Final   Colony Count 9,000 COLONIES/ML   Final   Culture INSIGNIFICANT GROWTH   Final   Report Status 09/26/2012 FINAL   Final    Assessment: 42 YOM admited with aspiration vs community acquired pneumonia on day #4/5 Levaquin 750 mg IV q48h.  Estimated CrCl~48 ml/min.  UOP 0.5 mL/kg/hr yesterday following coude catheter placement.   Goal of Therapy:  dose adjusted per renal clearance  Plan:  Patient meets criteria for switch to PO abx (tolerating PO medications, afebrile, clinical improvement per MD notes) so will switch Levaquin to 750 mg PO q48h.  Per original MD order, Levaquin was ordered x 5 days total so last dose will be due tomorrow.  Clance Boll 09/27/2012,8:40 AM

## 2012-09-27 NOTE — Progress Notes (Signed)
Physical Therapy Treatment Patient Details Name: [name] MRN: [id number] DOB: 07-14-1924 Today's Date: 09/27/2012 Time: 4098-1191 PT Time Calculation (min): 33 min  PT Assessment / Plan / Recommendation Comments on Treatment Session  Spouse still hopefull to take pt home and states she can assist him as she has done for years. Assisted pt OOB and amb to bathroom.  Pt required Mod assist for hygiene and only tolerated amb back to recliner.     Follow Up Recommendations  Spouse wants to take pt home She states she can provide care as she has for years Has all equipment ( 2 RW, 3:1, shower seat, wc)     Does the patient have the potential to tolerate intense rehabilitation     Barriers to Discharge        Equipment Recommendations       Recommendations for Other Services    Frequency Min 3X/week   Plan      Precautions / Restrictions Precautions Precautions: Fall Precaution Comments: dyspnea Restrictions Weight Bearing Restrictions: No   Poss need for home O2; SATURATION QUALIFICATIONS: (This note is used to comply with regulatory documentation for home oxygen)  Patient Saturations on Room Air at Rest = 86%  Patient Saturations on Room Air while Ambulating = 86%  Patient Saturations on 2 Liters of oxygen while Ambulating = 94%  Please briefly explain why patient needs home oxygen:    Mobility  Bed Mobility Bed Mobility: Supine to Sit Supine to Sit: 4: Min guard Details for Bed Mobility Assistance: increased time as pt struggled to complete Transfers Transfers: Sit to Stand;Stand to Sit Sit to Stand: 4: Min assist;From bed;From toilet Stand to Sit: 4: Min assist;To chair/3-in-1;With armrests Details for Transfer Assistance: 50% VC's on proper tech and hand placement esp to avoid pulling self up using RW. Ambulation/Gait Ambulation/Gait Assistance: 4: Min assist Ambulation Distance (Feet): 24 Feet (12' x 2) Assistive device: Rolling walker Ambulation/Gait  Assistance Details: Pt only able to tolerate amb to and from bathroom due to increased c/o fatigue and mild SOB. Pt also c/o mild dizzyness.  RA decreased to 86% so re applied 2 lts nasal increased to 94%. Very slow gait, nearly 4 min to amb 12'. Gait Pattern: Step-through pattern;Decreased step length - left;Decreased step length - right;Decreased stride length;Trunk flexed Gait velocity: very slow     PT Goals                                                 progressing    Visit Information  Last PT Received On: 09/27/12 Assistance Needed: +1    Subjective Data      Cognition    fair   Balance   fair  End of Session PT - End of Session Equipment Utilized During Treatment: Gait belt Activity Tolerance: Patient limited by fatigue Patient left: in chair;with call bell/phone within reach;with nursing in room;with family/visitor present   Felecia Shelling  PTA [facility]  Acute  Rehab Pager      [phone]

## 2012-09-27 NOTE — Progress Notes (Addendum)
[facility] PROGRESS NOTE  [name]:[id number] DOB: 1925/02/26 DOA: 09/24/2012 PCP: Sonda Primes, MD  Brief Narrative: 79 year old male with past medical history of HTN, DM, Paranoia, Dementia presented to [facility] ED with generalized weakness and persistent fevers and cough for about 1 week prior to this admission. Patient was seen in PCP office and was given azithromycin but has progressively gotten worse. He has returned to his PCP and was given Levaquin which he never had a chance to take as he developed sudden nausea and vomiting.  In ED, patient's oxygen saturation was 89% while breathing ambient air. With 2 L nasal canula, O2 saturation increased to 97%. Further evaluation included CXR which showed right basilar infiltrates. CBC revealed mild leukocytosis of 12.6. Since admission he has had problems with delirium, agitation, and then had acute urinary retention for which a coude catheter was placed per Urology. See details below  Assessment/Plan:  Aspiration vs Community acquired pneumonia Continue Levaquin IV daily Day 3 Febrile this am : will repeat CXR today Sputum culture negative Urine legionella, pneumococcal antigen negative Swallow eval completed 3/31, per speech recommendations on : regular diet with thin liquids  Urinary retention/hematuria Coude catheter placed per Urology early 3/30 Hematuria clearing Urology following  Diabetes  Continue glipizide and metformin , SSI Hb A1c6.1  HYPERLIPIDEMIA  Continue simvastatin  HYPERTENSION  Continue home meds: Norvasc, atenolol, diovan  ANXIETY AND DEPRESSION  Continue Clonazepam  Dementia with agitation/paranoia        - improved, did require Haldol briefly Continue aricept For agitation, Ativan PRN Improved from behavior standpoint  VITAMIN B12 and B6 DEFICIENCY  Continue B12 and B6 medication supplements per home doasge  PT/OT  Code Status: FULL Family Communication: d/w wife at  bedside Disposition Plan: Home with wife when better   Antibiotics:  IV levaquin 3/29  HPI/Subjective: Breathing ok, cough, unable to cough up, febrile this am  Objective: Filed Vitals:   09/26/12 1711 09/26/12 2115 09/26/12 2232 09/27/12 0612  BP: 147/73 150/66  174/70  Pulse:  61  58  Temp:  100.9 F (38.3 C) 97.8 F (36.6 C) 99.1 F (37.3 C)  TempSrc:  Oral Oral Oral  Resp:    18  Height:      Weight:    60.8 kg (134 lb 0.6 oz)  SpO2:  92%  90%    Intake/Output Summary (Last 24 hours) at 09/27/12 0901 Last data filed at 09/27/12 0748  Gross per 24 hour  Intake    480 ml  Output    701 ml  Net   -221 ml   Filed Weights   09/25/12 0600 09/26/12 0600 09/27/12 0612  Weight: 64.7 kg (142 lb 10.2 oz) 67.3 kg (148 lb 5.9 oz) 60.8 kg (134 lb 0.6 oz)    Exam:   General:  AAOx to self and place  Cardiovascular: S1S2/RRR  Respiratory: Ronchi at bases, R>L  Abdomen: soft, NT, BS present  Ext: no edema c/c  Neuro: non focal  Data Reviewed: Basic Metabolic Panel:  Recent Labs Lab 09/24/12 1917 09/24/12 2144 09/25/12 0524  NA 141 140 140  K 4.2 4.2 4.0  CL 105 105 105  CO2  --  29 26  GLUCOSE 142* 132* 75  BUN 25* 23 21  CREATININE 1.20 1.01 0.90  CALCIUM  --  9.4 9.1  MG  --  1.7  --   PHOS  --  3.2  --    Liver Function Tests:  Recent Labs Lab 09/24/12 2144 09/25/12 0524  AST 12 13  ALT 10 9  ALKPHOS 54 49  BILITOT 0.5 0.5  PROT 7.2 6.9  ALBUMIN 2.9* 2.7*   No results found for this basename: LIPASE, AMYLASE,  in the last 168 hours No results found for this basename: AMMONIA,  in the last 168 hours CBC:  Recent Labs Lab 09/24/12 1910 09/24/12 1917 09/24/12 2144 09/25/12 0524  WBC 12.6*  --  9.0 6.9  NEUTROABS 10.3*  --  6.8  --   HGB 12.8* 13.3 12.2* 11.3*  HCT 37.9* 39.0 35.8* 33.3*  MCV 99.5  --  99.2 99.4  PLT 221  --  210 190   Cardiac Enzymes: No results found for this basename: CKTOTAL, CKMB, CKMBINDEX, TROPONINI,  in  the last 168 hours BNP (last 3 results) No results found for this basename: PROBNP,  in the last 8760 hours CBG:  Recent Labs Lab 09/24/12 1844 09/24/12 2113 09/25/12 0734 09/26/12 0737  GLUCAP 143* 130* 92 88    Recent Results (from the past 240 hour(s))  URINE CULTURE     Status: None   Collection Time    09/24/12 11:05 PM      Result Value Range Status   Specimen Description URINE, CLEAN CATCH   Final   Special Requests NONE   Final   Culture  Setup Time 09/25/2012 16:05   Final   Colony Count 9,000 COLONIES/ML   Final   Culture INSIGNIFICANT GROWTH   Final   Report Status 09/26/2012 FINAL   Final     Studies: Dg Swallowing Func-speech Pathology  09/26/2012  [name], CCC-SLP     09/26/2012  3:04 PM Objective Swallowing Evaluation: Modified Barium Swallowing Study   Patient Details  Name: [name] MRN: [id number] Date of Birth: [birth year]  Today's Date: 09/26/2012 Time: 1340-1405 SLP Time Calculation (min): 25 min  Past Medical History:  Past Medical History  Diagnosis Date  . Carotid artery stenosis   . Gait disturbance   . Anxiety   . GERD (gastroesophageal reflux disease)   . Vitamin B12 deficiency   . Depression   . Osteoarthritis   . Diabetes mellitus, type 2   . Hypertension   . Vitamin D deficiency   . Tremor   . Aortic stenosis   . Hyperlipidemia   . Diverticulosis   . Adenomatous polyp of colon    Past Surgical History:  Past Surgical History  Procedure Laterality Date  . Vasectomy    . Lumbar laminectomy      L5 Dr Ophelia Charter  . Transurethral resection of prostate  March 2007   HPI:  79 yo male admitted to [facility] with cough and weakness.   PMH + for  B12 deficiency, depression, HTN, CAP, leukocytosis, N/V, DM.   Order received for bedside swallow evaluation.  CXR 09/23/12  showed right basilar infiltrate, CT head negative.       Assessment / Plan / Recommendation Clinical Impression  Dysphagia Diagnosis: Moderate pharyngeal phase  dysphagia;Moderate oral phase  dysphagia;Moderate cervical  esophageal phase dysphagia  Clinical impression: Pt presents with moderate oropharyngeal and  cervical esophageal dysphagia, also suspect component of  esophageal deficits.  Dysphagia characterized by delayed oral  transit, decr bolus cohesion resulting in piecemeal deglutition  and minimal oral stasis.  Pharyngeal swallow characterized by  decreased pharyngeal contraction and decreased tongue base  retraction, decreased laryngeal elevation resulting in pharyngeal  stasis that mixed with secretions.  Mild laryngeal penetration  of nectar cleared with cued throat clear and both reflexive and  cued dry swallows decreased amount of pharyngeal stasis.    Please note, pt coughed during tesitng without barium visualized  in trachea.    Pt was educated to findings of test, purpose of  compensatory strategie during entire procedure.    Due to pt's weakness, multifactorial dysphagia without sensation,  weak coughhe will be a chronic aspiration, strategies in place to  mitigate risk.    SLP to follow for dysphagia management, family/pt education.   Thanks for the order.      Treatment Recommendation  TBD   Diet Recommendation Regular;Thin liquid   Liquid Administration via: Cup;Straw Medication Administration: Crushed with puree Supervision: Full supervision/cueing for compensatory strategies Compensations: Slow rate;Small sips/bites;Check for  pocketing;Multiple dry swallows after each bite/sip;Follow solids  with liquid;Clear throat intermittently (rest breaks if short of  breath or coughing) Postural Changes and/or Swallow Maneuvers: Seated upright 90  degrees;Upright 30-60 min after meal    Other  Recommendations Recommended Consults: MBS Oral Care Recommendations: Oral care before and after PO   Follow Up Recommendations  Skilled Nursing facility    Frequency and Duration min 2x/week  2 weeks   Pertinent Vitals/Pain Afebrile, decreased- weak cough    SLP Swallow Goals Patient will utilize  recommended strategies during swallow to  increase swallowing safety with: Total assistance Swallow Study Goal #2 - Progress: Progressing toward goal Goal #3: Pt's family will verbalize precautions to mitigate  aspiration risk with mod assistance.    General Date of Onset: 09/26/12 HPI: 79 yo male admitted to [facility] with cough and weakness.   PMH +  for B12 deficiency, depression, HTN, CAP, leukocytosis, N/V, DM.   Order received for bedside swallow evaluation.  CXR 09/23/12  showed right basilar infiltrate, CT head negative.   Type of Study: Modified Barium Swallowing Study Reason for Referral: Objectively evaluate swallowing function Previous Swallow Assessment: BSE today Diet Prior to this Study: Regular;Thin liquids Temperature Spikes Noted: No Respiratory Status: Supplemental O2 delivered via (comment) Behavior/Cognition: Alert;Requires cueing;Impulsive;Decreased  sustained attention (follows directions intermittently) Oral Cavity - Dentition: Adequate natural dentition Oral Motor / Sensory Function: Impaired - see Bedside swallow  eval (general weakness) Self-Feeding Abilities: Total assist Patient Positioning: Upright in chair Baseline Vocal Quality: Low vocal intensity;Hoarse Volitional Cough: Weak (nonproductive) Volitional Swallow: Unable to elicit Pharyngeal Secretions: Standing secretions in (comment)  (secretions mixed with barium retained throughout pharynx)    Reason for Referral Objectively evaluate swallowing function   Oral Phase Oral Preparation/Oral Phase Oral Phase: Impaired Oral - Nectar Oral - Nectar Teaspoon: Delayed oral transit;Lingual/palatal  residue;Piecemeal swallowing;Weak lingual manipulation;Reduced  posterior propulsion Oral - Nectar Cup: Delayed oral transit;Piecemeal swallowing;Weak  lingual manipulation;Reduced posterior propulsion Oral - Thin Oral - Thin Teaspoon: Piecemeal swallowing;Delayed oral  transit;Lingual pumping;Reduced posterior propulsion Oral - Thin Cup: Piecemeal  swallowing;Delayed oral  transit;Lingual pumping;Weak lingual manipulation;Reduced  posterior propulsion Oral - Thin Straw: Piecemeal swallowing;Delayed oral  transit;Lingual/palatal residue;Weak lingual manipulation;Reduced  posterior propulsion Oral - Solids Oral - Puree: Reduced posterior propulsion;Piecemeal swallowing Oral - Regular: Impaired mastication;Delayed oral  transit;Piecemeal swallowing   Pharyngeal Phase Pharyngeal Phase Pharyngeal Phase: Impaired Pharyngeal - Nectar Pharyngeal - Nectar Teaspoon: Reduced pharyngeal  peristalsis;Premature spillage to valleculae;Pharyngeal residue -  pyriform sinuses;Pharyngeal residue - valleculae Pharyngeal - Nectar Cup: Reduced pharyngeal  peristalsis;Penetration/Aspiration during swallow;Premature  spillage to valleculae;Pharyngeal residue - pyriform  sinuses;Pharyngeal residue - valleculae Penetration/Aspiration details (nectar cup): Material enters  airway, remains ABOVE vocal cords and not ejected out (cued  throat clear removed mild penetrates) Pharyngeal - Thin Pharyngeal - Thin Teaspoon: Reduced pharyngeal  peristalsis;Premature spillage to valleculae;Pharyngeal residue -  valleculae;Pharyngeal residue - pyriform sinuses Pharyngeal - Thin Cup: Premature spillage to  valleculae;Pharyngeal residue - pyriform sinuses;Pharyngeal  residue - valleculae Pharyngeal - Thin Straw: Reduced pharyngeal peristalsis;Premature  spillage to valleculae;Pharyngeal residue - pyriform  sinuses;Pharyngeal residue - valleculae Pharyngeal - Solids Pharyngeal - Puree: Reduced pharyngeal peristalsis;Premature  spillage to valleculae;Pharyngeal residue - valleculae;Pharyngeal  residue - pyriform sinuses Pharyngeal - Regular: Premature spillage to valleculae  Cervical Esophageal Phase    GO    Cervical Esophageal Phase Cervical Esophageal Phase: Impaired Cervical Esophageal Phase - Nectar Nectar Teaspoon: Reduced cricopharyngeal relaxation Cervical Esophageal Phase - Thin Thin Teaspoon:  Reduced cricopharyngeal relaxation Cervical Esophageal Phase - Solids Puree: Reduced cricopharyngeal relaxation Regular: Reduced cricopharyngeal relaxation Cervical Esophageal Phase - Comment Cervical Esophageal Comment: Appearance of cervical osteophytes  may further impair bolus clearance, secretions retained in  pharynx without pt sensation nor ability to fully clear.   Appearance of decreased clearance distally in esophagus with  tertiary contractions on intermittent basis without pt sensation,  ? consistent with dysmotility, radiologist not present to  confirm.  Rec pt follow esophageal precautions to maximize pt's  airway protection.           Donavan Burnet, MS [facility] SLP [phone]      Scheduled Meds: . amLODipine  10 mg Oral Daily  . aspirin EC  81 mg Oral Daily  . atenolol  25 mg Oral BID  . atorvastatin  20 mg Oral QHS  . cholecalciferol  1,000 Units Oral Daily  . cyanocobalamin  1,000 mcg Intramuscular Q14 Days  . donepezil  5 mg Oral QHS  . glipiZIDE  10 mg Oral Q breakfast  . hydrochlorothiazide  12.5 mg Oral Daily  . irbesartan  300 mg Oral Daily  . [START ON 09/28/2012] levofloxacin  750 mg Oral Q48H  . metFORMIN  1,000 mg Oral Q breakfast  . metFORMIN  500 mg Oral Q supper  . vitamin B-6  25 mg Oral Daily  . vitamin C  500 mg Oral Daily  . zolpidem  5 mg Oral QHS   Continuous Infusions:    Principal Problem:   Acute respiratory failure with hypoxia Active Problems:   VITAMIN B12 DEFICIENCY   HYPERLIPIDEMIA   ANXIETY   DEPRESSION   HYPERTENSION   Dementia   CAP (community acquired pneumonia)   Leukocytosis   Diabetes   Nausea and vomiting   Urinary retention    Time spent:    [facility]  [facility] Pager [phone]. If 7PM-7AM, please contact night-coverage at [email or web address], password [facility] 09/27/2012, 9:01 AM  LOS: 3 days

## 2012-09-28 ENCOUNTER — Ambulatory Visit: Payer: MEDICARE | Admitting: Physical Therapy

## 2012-09-28 LAB — BASIC METABOLIC PANEL
BUN: 12 mg/dL (ref 6–23)
Chloride: 101 mEq/L (ref 96–112)
GFR calc Af Amer: 88 mL/min — ABNORMAL LOW (ref >90)
Potassium: 3 mEq/L — ABNORMAL LOW (ref 3.5–5.1)
Sodium: 137 mEq/L (ref 135–145)

## 2012-09-28 LAB — CBC
HCT: 32.9 % — ABNORMAL LOW (ref 39.0–52.0)
RDW: 14.3 % (ref 11.5–15.5)
WBC: 12.7 10*3/uL — ABNORMAL HIGH (ref 4.0–10.5)

## 2012-09-28 LAB — GLUCOSE, CAPILLARY: Glucose-Capillary: 83 mg/dL (ref 70–99)

## 2012-09-28 MED ORDER — ALBUTEROL SULFATE (5 MG/ML) 0.5% IN NEBU
2.5000 mg | INHALATION_SOLUTION | Freq: Four times a day (QID) | RESPIRATORY_TRACT | Status: DC
  Administered 2012-09-28: 2.5 mg via RESPIRATORY_TRACT
  Filled 2012-09-28 (×3): qty 0.5

## 2012-09-28 MED ORDER — CLINDAMYCIN HCL 300 MG PO CAPS
450.0000 mg | ORAL_CAPSULE | Freq: Three times a day (TID) | ORAL | Status: DC
  Administered 2012-09-28 (×2): 450 mg via ORAL
  Filled 2012-09-28 (×6): qty 1

## 2012-09-28 NOTE — Progress Notes (Signed)
While taking safety sitter from patient as a "trial", Pt pulled Coude Foley Placed on 3/30 by Dr. Brunilda Payor of [facility]. Awaiting call back from the page placed to the on call MD about replacing or leaving out. Pt agitated and confused. Trying to calm and re-orient patient. Anti anxiety meds given. Patient currently sitting in recliner at the nurses station.

## 2012-09-28 NOTE — Progress Notes (Signed)
[facility] PROGRESS NOTE  ANASTASIO WOGAN BJY:[id number] DOB: 06/18/1925 DOA: 09/24/2012 PCP: Sonda Primes, MD  Brief Narrative: 79 year old male with past medical history of HTN, DM, Paranoia, Dementia presented to [facility] ED with generalized weakness and persistent fevers and cough for about 1 week prior to this admission. Patient was seen in PCP office and was given azithromycin 3.25 but has progressively gotten worse. He has returned to his PCP 3.28 and was given Levaquin which he never had a chance to take as he developed sudden nausea and vomiting.  In ED, patient's oxygen saturation was 89% while breathing ambient air. With 2 L nasal canula, O2 saturation increased to 97%. Further evaluation included CXR which showed right basilar infiltrates. CBC revealed mild leukocytosis of 12.6. Since admission he has had problems with delirium, agitation, and then had acute urinary retention for which a coude catheter was placed per Urology-he pulled thi sout and now has a condom catheter   Assessment/Plan:  Aspiration vs Community acquired pneumonia Levaquin 56 days completed.  switched to Clindamycin 450 tid given aspiration risk and fever of 100.3.  Get 2 view CX in am and blood cultures Sputum culture negative Urine legionella, pneumococcal antigen negative Swallow eval completed 3/31, per speech recommendations on : regular diet with thin liquids  Urinary retention/hematuria Coude catheter placed per Urology early 3/30 Pulled cathter 4.1.14 pm Hematuria clearing Urology following-appreciate input  Diabetes  Continue glipizide and metformin , SSI Hb A1c 6.1  HYPERLIPIDEMIA  Continue Atorvastatin 25 qhs  HYPERTENSION  Continue home meds: Norvasc 10, atenolol 20 qhs, Irbesartan 300 daily  ANXIETY AND DEPRESSION  Continue Clonazepam  Dementia with agitation/paranoia        - improved, did require Haldol briefly Continue aricept 5 mg daily For agitation, Ativan PRN 0.5-1  mg Improved from behavior standpoint  VITAMIN B12 and B6 DEFICIENCY  Continue B12 and B6 medication supplements per home doasge  PT/OT  Code Status: FULL Family Communication: d/w wife at bedside-understands Disposition Plan: Home with wife when better   Antibiotics:  IV levaquin 3/29-->4.2.14  CLindamycin 450 PO tid 4.2.14  HPI/Subjective: Doing fiar.  somewhat confused-thinks he is at home.  Otherwise stable.  denies CP or SOB currently.  No n/v  Objective: Filed Vitals:   09/27/12 1555 09/27/12 2058 09/28/12 0707 09/28/12 1012  BP:  164/63 139/57   Pulse:  70 65   Temp:  98.5 F (36.9 C) 98 F (36.7 C) 100.3 F (37.9 C)  TempSrc:  Oral Oral Oral  Resp:  18 18   Height:      Weight:   60.782 kg (134 lb)   SpO2: 95% 93% 91% 86%    Intake/Output Summary (Last 24 hours) at 09/28/12 1021 Last data filed at 09/28/12 0700  Gross per 24 hour  Intake    360 ml  Output   1100 ml  Net   -740 ml   Filed Weights   09/26/12 0600 09/27/12 0612 09/28/12 0707  Weight: 67.3 kg (148 lb 5.9 oz) 60.8 kg (134 lb 0.6 oz) 60.782 kg (134 lb)    Exam:   General:  AAO only to self  Cardiovascular: S1S2/RRR  Respiratory: Ronchi at bases, R>L  Abdomen: soft, NT, BS present  Ext: no edema c/c  Neuro: non focal  Data Reviewed: Basic Metabolic Panel:  Recent Labs Lab 09/24/12 1917 09/24/12 2144 09/25/12 0524 09/28/12 0505  NA 141 140 140 137  K 4.2 4.2 4.0 3.0*  CL 105  105 105 101  CO2  --  29 26 27   GLUCOSE 142* 132* 75 72  BUN 25* 23 21 12   CREATININE 1.20 1.01 0.90 0.88  CALCIUM  --  9.4 9.1 9.4  MG  --  1.7  --   --   PHOS  --  3.2  --   --    Liver Function Tests:  Recent Labs Lab 09/24/12 2144 09/25/12 0524  AST 12 13  ALT 10 9  ALKPHOS 54 49  BILITOT 0.5 0.5  PROT 7.2 6.9  ALBUMIN 2.9* 2.7*   No results found for this basename: LIPASE, AMYLASE,  in the last 168 hours No results found for this basename: AMMONIA,  in the last 168  hours CBC:  Recent Labs Lab 09/24/12 1910 09/24/12 1917 09/24/12 2144 09/25/12 0524 09/28/12 0505  WBC 12.6*  --  9.0 6.9 12.7*  NEUTROABS 10.3*  --  6.8  --   --   HGB 12.8* 13.3 12.2* 11.3* 11.8*  HCT 37.9* 39.0 35.8* 33.3* 32.9*  MCV 99.5  --  99.2 99.4 96.5  PLT 221  --  210 190 301   Cardiac Enzymes: No results found for this basename: CKTOTAL, CKMB, CKMBINDEX, TROPONINI,  in the last 168 hours BNP (last 3 results) No results found for this basename: PROBNP,  in the last 8760 hours CBG:  Recent Labs Lab 09/24/12 2113 09/25/12 0734 09/26/12 0737 09/27/12 1031 09/28/12 0709  GLUCAP 130* 92 88 132* 83    Recent Results (from the past 240 hour(s))  URINE CULTURE     Status: None   Collection Time    09/24/12 11:05 PM      Result Value Range Status   Specimen Description URINE, CLEAN CATCH   Final   Special Requests NONE   Final   Culture  Setup Time 09/25/2012 16:05   Final   Colony Count 9,000 COLONIES/ML   Final   Culture INSIGNIFICANT GROWTH   Final   Report Status 09/26/2012 FINAL   Final     Studies: Dg Chest 2 View  09/27/2012  *RADIOLOGY REPORT*  Clinical Data: Follow up pneumonia  CHEST - 2 VIEW  Comparison: 09/23/2012  Findings: Mild enlargement of cardiac silhouette. Atherosclerotic ossification aorta. Pulmonary vascularity normal. Slight prominent right superior mediastinum stable. Improved right basilar infiltrate. Increased atelectasis versus consolidation in left lower lobe. Mild central peribronchial thickening. Component of left pleural effusion is also present. No pneumothorax.  IMPRESSION: Improved right basilar infiltrate. Newly identified small left pleural effusion with increased atelectasis versus consolidation in left lower lobe.   Original Report Authenticated By: Ulyses Southward, M.D.    Dg Swallowing Func-speech Pathology  09/26/2012  Chales Abrahams, CCC-SLP     09/26/2012  3:04 PM Objective Swallowing Evaluation: Modified Barium Swallowing  Study   Patient Details  Name: [name] MRN: [id number] Date of Birth: [birth year]  Today's Date: 09/26/2012 Time: 1340-1405 SLP Time Calculation (min): 25 min  Past Medical History:  Past Medical History  Diagnosis Date  . Carotid artery stenosis   . Gait disturbance   . Anxiety   . GERD (gastroesophageal reflux disease)   . Vitamin B12 deficiency   . Depression   . Osteoarthritis   . Diabetes mellitus, type 2   . Hypertension   . Vitamin D deficiency   . Tremor   . Aortic stenosis   . Hyperlipidemia   . Diverticulosis   . Adenomatous polyp of colon    Past  Surgical History:  Past Surgical History  Procedure Laterality Date  . Vasectomy    . Lumbar laminectomy      L5 Dr Ophelia Charter  . Transurethral resection of prostate  March 2007   HPI:  79 yo male admitted to [facility] with cough and weakness.   PMH + for  B12 deficiency, depression, HTN, CAP, leukocytosis, N/V, DM.   Order received for bedside swallow evaluation.  CXR 09/23/12  showed right basilar infiltrate, CT head negative.       Assessment / Plan / Recommendation Clinical Impression  Dysphagia Diagnosis: Moderate pharyngeal phase  dysphagia;Moderate oral phase dysphagia;Moderate cervical  esophageal phase dysphagia  Clinical impression: Pt presents with moderate oropharyngeal and  cervical esophageal dysphagia, also suspect component of  esophageal deficits.  Dysphagia characterized by delayed oral  transit, decr bolus cohesion resulting in piecemeal deglutition  and minimal oral stasis.  Pharyngeal swallow characterized by  decreased pharyngeal contraction and decreased tongue base  retraction, decreased laryngeal elevation resulting in pharyngeal  stasis that mixed with secretions.   Mild laryngeal penetration  of nectar cleared with cued throat clear and both reflexive and  cued dry swallows decreased amount of pharyngeal stasis.    Please note, pt coughed during tesitng without barium visualized  in trachea.    Pt was educated to findings of test, purpose of   compensatory strategie during entire procedure.    Due to pt's weakness, multifactorial dysphagia without sensation,  weak coughhe will be a chronic aspiration, strategies in place to  mitigate risk.    SLP to follow for dysphagia management, family/pt education.   Thanks for the order.      Treatment Recommendation  TBD   Diet Recommendation Regular;Thin liquid   Liquid Administration via: Cup;Straw Medication Administration: Crushed with puree Supervision: Full supervision/cueing for compensatory strategies Compensations: Slow rate;Small sips/bites;Check for  pocketing;Multiple dry swallows after each bite/sip;Follow solids  with liquid;Clear throat intermittently (rest breaks if short of  breath or coughing) Postural Changes and/or Swallow Maneuvers: Seated upright 90  degrees;Upright 30-60 min after meal    Other  Recommendations Recommended Consults: MBS Oral Care Recommendations: Oral care before and after PO   Follow Up Recommendations  Skilled Nursing facility    Frequency and Duration min 2x/week  2 weeks   Pertinent Vitals/Pain Afebrile, decreased- weak cough    SLP Swallow Goals Patient will utilize recommended strategies during swallow to  increase swallowing safety with: Total assistance Swallow Study Goal #2 - Progress: Progressing toward goal Goal #3: Pt's family will verbalize precautions to mitigate  aspiration risk with mod assistance.    General Date of Onset: 09/26/12 HPI: 79 yo male admitted to [facility] with cough and weakness.   PMH +  for B12 deficiency, depression, HTN, CAP, leukocytosis, N/V, DM.   Order received for bedside swallow evaluation.  CXR 09/23/12  showed right basilar infiltrate, CT head negative.   Type of Study: Modified Barium Swallowing Study Reason for Referral: Objectively evaluate swallowing function Previous Swallow Assessment: BSE today Diet Prior to this Study: Regular;Thin liquids Temperature Spikes Noted: No Respiratory Status: Supplemental O2 delivered via (comment)  Behavior/Cognition: Alert;Requires cueing;Impulsive;Decreased  sustained attention (follows directions intermittently) Oral Cavity - Dentition: Adequate natural dentition Oral Motor / Sensory Function: Impaired - see Bedside swallow  eval (general weakness) Self-Feeding Abilities: Total assist Patient Positioning: Upright in chair Baseline Vocal Quality: Low vocal intensity;Hoarse Volitional Cough: Weak (nonproductive) Volitional Swallow: Unable to elicit Pharyngeal Secretions: Standing secretions in (comment)  (secretions  mixed with barium retained throughout pharynx)    Reason for Referral Objectively evaluate swallowing function   Oral Phase Oral Preparation/Oral Phase Oral Phase: Impaired Oral - Nectar Oral - Nectar Teaspoon: Delayed oral transit;Lingual/palatal  residue;Piecemeal swallowing;Weak lingual manipulation;Reduced  posterior propulsion Oral - Nectar Cup: Delayed oral transit;Piecemeal swallowing;Weak  lingual manipulation;Reduced posterior propulsion Oral - Thin Oral - Thin Teaspoon: Piecemeal swallowing;Delayed oral  transit;Lingual pumping;Reduced posterior propulsion Oral - Thin Cup: Piecemeal swallowing;Delayed oral  transit;Lingual pumping;Weak lingual manipulation;Reduced  posterior propulsion Oral - Thin Straw: Piecemeal swallowing;Delayed oral  transit;Lingual/palatal residue;Weak lingual manipulation;Reduced  posterior propulsion Oral - Solids Oral - Puree: Reduced posterior propulsion;Piecemeal swallowing Oral - Regular: Impaired mastication;Delayed oral  transit;Piecemeal swallowing   Pharyngeal Phase Pharyngeal Phase Pharyngeal Phase: Impaired Pharyngeal - Nectar Pharyngeal - Nectar Teaspoon: Reduced pharyngeal  peristalsis;Premature spillage to valleculae;Pharyngeal residue -  pyriform sinuses;Pharyngeal residue - valleculae Pharyngeal - Nectar Cup: Reduced pharyngeal  peristalsis;Penetration/Aspiration during swallow;Premature  spillage to valleculae;Pharyngeal residue - pyriform   sinuses;Pharyngeal residue - valleculae Penetration/Aspiration details (nectar cup): Material enters  airway, remains ABOVE vocal cords and not ejected out (cued  throat clear removed mild penetrates) Pharyngeal - Thin Pharyngeal - Thin Teaspoon: Reduced pharyngeal  peristalsis;Premature spillage to valleculae;Pharyngeal residue -  valleculae;Pharyngeal residue - pyriform sinuses Pharyngeal - Thin Cup: Premature spillage to  valleculae;Pharyngeal residue - pyriform sinuses;Pharyngeal  residue - valleculae Pharyngeal - Thin Straw: Reduced pharyngeal peristalsis;Premature  spillage to valleculae;Pharyngeal residue - pyriform  sinuses;Pharyngeal residue - valleculae Pharyngeal - Solids Pharyngeal - Puree: Reduced pharyngeal peristalsis;Premature  spillage to valleculae;Pharyngeal residue - valleculae;Pharyngeal  residue - pyriform sinuses Pharyngeal - Regular: Premature spillage to valleculae  Cervical Esophageal Phase    GO    Cervical Esophageal Phase Cervical Esophageal Phase: Impaired Cervical Esophageal Phase - Nectar Nectar Teaspoon: Reduced cricopharyngeal relaxation Cervical Esophageal Phase - Thin Thin Teaspoon: Reduced cricopharyngeal relaxation Cervical Esophageal Phase - Solids Puree: Reduced cricopharyngeal relaxation Regular: Reduced cricopharyngeal relaxation Cervical Esophageal Phase - Comment Cervical Esophageal Comment: Appearance of cervical osteophytes  may further impair bolus clearance, secretions retained in  pharynx without pt sensation nor ability to fully clear.   Appearance of decreased clearance distally in esophagus with  tertiary contractions on intermittent basis without pt sensation,  ? consistent with dysmotility, radiologist not present to  confirm.  Rec pt follow esophageal precautions to maximize pt's  airway protection.           Donavan Burnet, MS [facility] SLP [phone]      Scheduled Meds: . albuterol  2.5 mg Nebulization Q6H WA  . amLODipine  10 mg Oral Daily  . aspirin EC  81 mg  Oral Daily  . atenolol  25 mg Oral BID  . atorvastatin  20 mg Oral QHS  . cholecalciferol  1,000 Units Oral Daily  . clindamycin  450 mg Oral Q8H  . cyanocobalamin  1,000 mcg Intramuscular Q14 Days  . donepezil  5 mg Oral QHS  . glipiZIDE  10 mg Oral Q breakfast  . hydrochlorothiazide  12.5 mg Oral Daily  . irbesartan  300 mg Oral Daily  . levofloxacin  750 mg Oral Q48H  . metFORMIN  1,000 mg Oral Q breakfast  . metFORMIN  500 mg Oral Q supper  . multivitamin  2 tablet Oral Daily  . vitamin B-6  25 mg Oral Daily  . vitamin C  500 mg Oral Daily  . zolpidem  5 mg Oral QHS   Continuous Infusions:    Principal Problem:  Acute respiratory failure with hypoxia Active Problems:   VITAMIN B12 DEFICIENCY   HYPERLIPIDEMIA   ANXIETY   DEPRESSION   HYPERTENSION   Dementia   CAP (community acquired pneumonia)   Leukocytosis   Diabetes   Nausea and vomiting   Urinary retention    Time spent:    Rhetta Mura  [facility] Pager [phone]. If 7PM-7AM, please contact night-coverage at [email or web address], password [facility] 09/28/2012, 10:21 AM  LOS: 4 days

## 2012-09-28 NOTE — Progress Notes (Signed)
Spoke with MD Patsi Sears of [facility] and is willing to replace foley if it is recommended by the Attending hospitalist and to simply call him with the answer. For now he recommends placing a condom cath, and assessing his voiding pattern. Will also communicate this info with oncoming RN

## 2012-09-28 NOTE — Progress Notes (Signed)
Speech Language Pathology Dysphagia Treatment Patient Details Name: [name] MRN: [id number] DOB: 12/28/1924 Today's Date: 09/28/2012 Time: 4098-1191 SLP Time Calculation (min): 42 min  Assessment / Plan / Recommendation Clinical Impression  Skilled dysphagia treatment completed with spouse present- note pt today with fever and low oxygen saturation on room air.  Pt and spouse were assisted with breakfast, occasional cough continues more so with po intake than without.  Suspect pt is continuing to have aspiration - though on MBS pt coughed with aspiration observed.  Use of straw is not advised at this time due to large amount he is consuming, suspect worsening aspiration.    Rec continue diet with STRICT precautions, wife educated and agreeable.  Advised RN to give pt medicine with applesauce - follow with water - due to aspiration concerns.  Also spoke to respiratory re: possible use of breathing apparatus to improve aeration - RT states she will speak to RN.    SLP to follow to mitigate aspiration risk.  Please note, spouse states she was ill with laryngitis prior to pt's admission.      Diet Recommendation  Continue with Current Diet: Regular;Thin liquid (encouraged spouse to order soft foods only!)    SLP Plan Continue with current plan of care   Pertinent Vitals/Pain Febrile, lungs rhonchi- pt will not cough to attempt to clear!   Swallowing Goals  SLP Swallowing Goals Swallow Study Goal #2 - Progress: Progressing toward goal Swallow Study Goal #3 - Progress: Progressing toward goal  General Temperature Spikes Noted: Yes Respiratory Status: Supplemental O2 delivered via (comment) (was on room air upon slp arrival, o2 replaced d/t low sats) Behavior/Cognition: Confused;Impulsive;Distractible;Doesn't follow directions;Alert;Decreased sustained attention;Hard of hearing Oral Cavity - Dentition: Adequate natural dentition Patient Positioning: Upright in bed  Oral Cavity - Oral  Hygiene     Dysphagia Treatment Treatment focused on: Skilled observation of diet tolerance;Patient/family/caregiver education Family/Caregiver Educated: spouse Treatment Methods/Modalities: Skilled observation Patient observed directly with PO's: Yes Type of PO's observed: Regular;Thin liquids Feeding: Total assist Liquids provided via: Cup;Straw Pharyngeal Phase Signs & Symptoms: Suspected delayed swallow initiation;Delayed cough;Changes in respirations Type of cueing: Tactile;Verbal;Visual Amount of cueing: Total   GO     Donavan Burnet, MS [facility] SLP [phone]

## 2012-09-28 NOTE — Progress Notes (Signed)
Patient pulled Foley out this morning.   Would leave catheter out.  If unable to void reinsert # 16 Fr Foley.

## 2012-09-29 ENCOUNTER — Inpatient Hospital Stay (HOSPITAL_COMMUNITY): Payer: MEDICARE

## 2012-09-29 LAB — GLUCOSE, CAPILLARY
Glucose-Capillary: 125 mg/dL — ABNORMAL HIGH (ref 70–99)
Glucose-Capillary: 222 mg/dL — ABNORMAL HIGH (ref 70–99)
Glucose-Capillary: 138 mg/dL — ABNORMAL HIGH (ref 70–99)

## 2012-09-29 MED ORDER — DEXTROSE 50 % IV SOLN
INTRAVENOUS | Status: AC
  Administered 2012-09-29: 50 mL
  Filled 2012-09-29: qty 50

## 2012-09-29 MED ORDER — ALBUTEROL SULFATE (5 MG/ML) 0.5% IN NEBU
2.5000 mg | INHALATION_SOLUTION | Freq: Three times a day (TID) | RESPIRATORY_TRACT | Status: DC
  Administered 2012-09-29 – 2012-09-30 (×5): 2.5 mg via RESPIRATORY_TRACT
  Filled 2012-09-29 (×5): qty 0.5

## 2012-09-29 MED ORDER — AMOXICILLIN-POT CLAVULANATE 875-125 MG PO TABS
1.0000 | ORAL_TABLET | Freq: Two times a day (BID) | ORAL | Status: DC
  Administered 2012-09-29 – 2012-09-30 (×3): 1 via ORAL
  Filled 2012-09-29 (×4): qty 1

## 2012-09-29 MED ORDER — DEXTROSE 50 % IV SOLN: 50.0000 mL | Freq: Once | INTRAVENOUS | Status: AC | PRN

## 2012-09-29 MED ORDER — INSULIN ASPART 100 UNIT/ML ~~LOC~~ SOLN
0.0000 [IU] | SUBCUTANEOUS | Status: DC
  Administered 2012-09-29: 3 [IU] via SUBCUTANEOUS
  Administered 2012-09-29 – 2012-09-30 (×3): 1 [IU] via SUBCUTANEOUS
  Administered 2012-09-30: 2 [IU] via SUBCUTANEOUS

## 2012-09-29 MED ORDER — POTASSIUM CHLORIDE CRYS ER 20 MEQ PO TBCR
40.0000 meq | EXTENDED_RELEASE_TABLET | Freq: Every day | ORAL | Status: DC
  Administered 2012-09-29 – 2012-09-30 (×2): 40 meq via ORAL
  Filled 2012-09-29 (×2): qty 2

## 2012-09-29 NOTE — Progress Notes (Signed)
[facility] PROGRESS NOTE  [name] ZOX:[id number] DOB: 1924-08-12 DOA: 09/24/2012 PCP: [name] Primes, MD  Brief Narrative: 79 year old male with past medical history of HTN, DM, Paranoia, Dementia presented to [facility] ED with generalized weakness and persistent fevers and cough for about 1 week prior to this admission. Patient was seen in PCP office and was given azithromycin 3.25 but has progressively gotten worse. He has returned to his PCP 3.28 and was given Levaquin which he never had a chance to take as he developed sudden nausea and vomiting.  In ED, patient's oxygen saturation was 89% while breathing ambient air. With 2 L nasal canula, O2 saturation increased to 97%. Further evaluation included CXR which showed right basilar infiltrates. CBC revealed mild leukocytosis of 12.6. Since admission he has had problems with delirium, agitation, and then had acute urinary retention for which a coude catheter was placed per Urology-he pulled thi sout and now has a condom catheter, but this came off and he didn't void   Assessment/Plan:  Aspiration vs Community acquired pneumonia Levaquin 5 days completed.  switched to Clindamycin 450 tid given aspiration risk and fever of 100.3. 2 view CX 09/29/12 shows persisting but improved LL infiltrate-Completion date for antibiotics would be 4.8.14 Follow Rpt Blood cultures 4.2.14 Sputum culture negative Urine legionella, pneumococcal antigen negative Swallow eval completed 3/31, per speech recommendations on : regular diet with thin liquids, encouraged soft foods and precautions with eating such as sitting up for 30 min  Urinary retention/hematuria Coude catheter placed per Urology early 3/30 Pulled cathter 4.1.14 pm Hematuria still present-Advised nursing to place 16 french catheter Urology input appreciated  Diabetes  Blood sugars transiently low in the 30's 4.3 am-patient symptomatic discontinued glipizide, Continue metformin , SSI Hb A1c  6.1  HYPERLIPIDEMIA  Continue Atorvastatin 25 qhs  HYPERTENSION  Continue home meds: Norvasc 10, atenolol 20 qhs, Irbesartan 300 daily  ANXIETY AND DEPRESSION  Continue Clonazepam  Dementia with agitation/paranoia        - improved, did require Haldol briefly Continue aricept 5 mg daily For agitation, Ativan PRN 0.5-1 mg Improved from behavior standpoint  VITAMIN B12 and B6 DEFICIENCY  Continue B12 and B6 medication supplements per home doasge  PT/OT  Code Status: FULL Family Communication: d/w wife at bedside-understands Disposition Plan: Home with wife when better-SHe wo;;    Antibiotics:  IV levaquin 3/29-->4.2.14  CLindamycin 450 PO tid 4.2.14-->4.8.14  HPI/Subjective: Doing fair-  " I don't feel any different"  No cough cold or chills.  Events noted with hypoglycemia   Objective: Filed Vitals:   09/28/12 2122 09/29/12 0502 09/29/12 0826 09/29/12 0948  BP: 107/52 109/70 134/94   Pulse: 56 52 111   Temp: 99.5 F (37.5 C) 97.7 F (36.5 C) 97.4 F (36.3 C)   TempSrc: Oral Oral Oral   Resp: 20 20 20    Height:      Weight:      SpO2: 93% 95% 95% 98%   No intake or output data in the 24 hours ending 09/29/12 1115 Filed Weights   09/26/12 0600 09/27/12 0612 09/28/12 0707  Weight: 67.3 kg (148 lb 5.9 oz) 60.8 kg (134 lb 0.6 oz) 60.782 kg (134 lb)    Exam:   General:  AAO only to self  Cardiovascular: S1S2/RRR  Respiratory: Ronchi at bases, bilateral  Abdomen: soft, NT, BS present  Ext: no edema c/c  Neuro: non focal  Data Reviewed: Basic Metabolic Panel:  Recent Labs Lab 09/24/12 1917 09/24/12  2144 09/25/12 0524 09/28/12 0505  NA 141 140 140 137  K 4.2 4.2 4.0 3.0*  CL 105 105 105 101  CO2  --  29 26 27   GLUCOSE 142* 132* 75 72  BUN 25* 23 21 12   CREATININE 1.20 1.01 0.90 0.88  CALCIUM  --  9.4 9.1 9.4  MG  --  1.7  --   --   PHOS  --  3.2  --   --    Liver Function Tests:  Recent Labs Lab 09/24/12 2144 09/25/12 0524  AST  12 13  ALT 10 9  ALKPHOS 54 49  BILITOT 0.5 0.5  PROT 7.2 6.9  ALBUMIN 2.9* 2.7*   No results found for this basename: LIPASE, AMYLASE,  in the last 168 hours No results found for this basename: AMMONIA,  in the last 168 hours CBC:  Recent Labs Lab 09/24/12 1910 09/24/12 1917 09/24/12 2144 09/25/12 0524 09/28/12 0505  WBC 12.6*  --  9.0 6.9 12.7*  NEUTROABS 10.3*  --  6.8  --   --   HGB 12.8* 13.3 12.2* 11.3* 11.8*  HCT 37.9* 39.0 35.8* 33.3* 32.9*  MCV 99.5  --  99.2 99.4 96.5  PLT 221  --  210 190 301   Cardiac Enzymes: No results found for this basename: CKTOTAL, CKMB, CKMBINDEX, TROPONINI,  in the last 168 hours BNP (last 3 results) No results found for this basename: PROBNP,  in the last 8760 hours CBG:  Recent Labs Lab 09/27/12 1031 09/28/12 0709 09/29/12 0730 09/29/12 0805 09/29/12 0831  GLUCAP 132* 83 30* 26* 125*    Recent Results (from the past 240 hour(s))  URINE CULTURE     Status: None   Collection Time    09/24/12 11:05 PM      Result Value Range Status   Specimen Description URINE, CLEAN CATCH   Final   Special Requests NONE   Final   Culture  Setup Time 09/25/2012 16:05   Final   Colony Count 9,000 COLONIES/ML   Final   Culture INSIGNIFICANT GROWTH   Final   Report Status 09/26/2012 FINAL   Final  CULTURE, BLOOD (ROUTINE X 2)     Status: None   Collection Time    09/28/12 11:00 AM      Result Value Range Status   Specimen Description BLOOD LEFT ARM   Final   Special Requests BOTTLES DRAWN AEROBIC AND ANAEROBIC 10CC   Final   Culture  Setup Time 09/28/2012 15:27   Final   Culture     Final   Value:        BLOOD CULTURE RECEIVED NO GROWTH TO DATE CULTURE WILL BE HELD FOR 5 DAYS BEFORE ISSUING A FINAL NEGATIVE REPORT   Report Status PENDING   Incomplete  CULTURE, BLOOD (ROUTINE X 2)     Status: None   Collection Time    09/28/12 11:05 AM      Result Value Range Status   Specimen Description BLOOD RIGHT ARM   Final   Special Requests  BOTTLES DRAWN AEROBIC AND ANAEROBIC 5CC   Final   Culture  Setup Time 09/28/2012 15:27   Final   Culture     Final   Value:        BLOOD CULTURE RECEIVED NO GROWTH TO DATE CULTURE WILL BE HELD FOR 5 DAYS BEFORE ISSUING A FINAL NEGATIVE REPORT   Report Status PENDING   Incomplete     Studies: Dg Chest 2  View  09/29/2012  *RADIOLOGY REPORT*  Clinical Data: Aspiration.  Weakness and shortness of breath  CHEST - 2 VIEW  Comparison: 09/27/2012  Findings: Low lung volumes are present.  Heart size is mildly enlarged and stable.  Calcification in the aorta is again noted and unchanged.  Bibasilar density is seen left greater than right and shows some interval improvement in aeration on the left in comparison with the prior exam .  The appearance is most suspicious for bibasilar atelectasis but a left lower lobe infiltrate in the posterior segment is not completely excluded.  The remainder of the lung fields are clear. Question small left pleural effusion.  IMPRESSION: Probable bibasilar atelectasis with slight interval improvement in left lower lobe aeration. A posterior segment left lower lobe infiltrate is not excluded.   Original Report Authenticated By: Rhodia Albright, M.D.     Scheduled Meds: . albuterol  2.5 mg Nebulization TID  . amLODipine  10 mg Oral Daily  . aspirin EC  81 mg Oral Daily  . atenolol  25 mg Oral BID  . atorvastatin  20 mg Oral QHS  . cholecalciferol  1,000 Units Oral Daily  . clindamycin  450 mg Oral Q8H  . cyanocobalamin  1,000 mcg Intramuscular Q14 Days  . donepezil  5 mg Oral QHS  . hydrochlorothiazide  12.5 mg Oral Daily  . insulin aspart  0-9 Units Subcutaneous Q4H  . irbesartan  300 mg Oral Daily  . levofloxacin  750 mg Oral Q48H  . metFORMIN  1,000 mg Oral Q breakfast  . metFORMIN  500 mg Oral Q supper  . multivitamin  2 tablet Oral Daily  . potassium chloride  40 mEq Oral Daily  . vitamin B-6  25 mg Oral Daily  . vitamin C  500 mg Oral Daily  . zolpidem  5 mg  Oral QHS   Continuous Infusions:    Principal Problem:   Acute respiratory failure with hypoxia Active Problems:   VITAMIN B12 DEFICIENCY   HYPERLIPIDEMIA   ANXIETY   DEPRESSION   HYPERTENSION   Dementia   CAP (community acquired pneumonia)   Leukocytosis   Diabetes   Nausea and vomiting   Urinary retention    Time spent:    Rhetta Mura  [facility] Pager [phone].  If 7PM-7AM, please contact night-coverage at [email or web address], password [facility] 09/29/2012, 11:15 AM  LOS: 5 days

## 2012-09-29 NOTE — Progress Notes (Signed)
Patient wife remains in the room. She has refuses to allow the patient to be awakened in the recent past for aerosol therapy. Earlier this shift, she again requests that he not be disturbed in the night if he is asleep. Stepped into room to check on him, and both he and his wife were asleep. No distress noted at this time. Nebulizer order changed to 3 times daily during the awake hours per RT protocol.

## 2012-09-29 NOTE — Progress Notes (Signed)
Speech Language Pathology Dysphagia Treatment Patient Details Name: [name] MRN: [id number] DOB: [birth year] Today's Date: 09/29/2012 Time: 6213-0865 SLP Time Calculation (min): 15 min  Assessment / Plan / Recommendation Clinical Impression  Purpose of visit was to assess tolerance of po diet,Skiilled dysphagia treatment completed with spouse again present.  Pt with stronger voice today and good tolerance of po intake per spouse and patient.  RN reports good tolerance of po medications although pt does not like to take them with applesauce. SLP educated pt to clinical reasoning for taking medications with icecream or applesauce, to which he agreed.    Pt has used an incentive spirometer a few times today per pt report.  Observed pt to swallow pill with icecream followed by water, masticate cracker without oral stasis or any indications of aspiration.  Note results of CXR today.    Educated spouse and pt to aspiration precautions and tips to mitigate aspiration risk and provided SLP contact number for use if indicated.  Pt states his swallow ability is at baseline.  Hopeful for continued improvement as pt medically improves.      Diet Recommendation  Continue with Current Diet: Regular;Thin liquid    SLP Plan Continue with current plan of care   Pertinent Vitals/Pain Afebrile today, lungs decreased   Swallowing Goals  SLP Swallowing Goals Swallow Study Goal #2 - Progress: Progressing toward goal Swallow Study Goal #3 - Progress: Met  General Temperature Spikes Noted: No Respiratory Status: Supplemental O2 delivered via (comment) Behavior/Cognition: Alert;Cooperative;Pleasant mood;Hard of hearing Oral Cavity - Dentition: Adequate natural dentition Patient Positioning: Upright in bed  Oral Cavity - Oral Hygiene   oral cavity clear  Dysphagia Treatment Treatment focused on: Skilled observation of diet tolerance;Patient/family/caregiver education Family/Caregiver Educated:  spouse Treatment Methods/Modalities: Skilled observation Patient observed directly with PO's: Yes Type of PO's observed: Regular;Thin liquids (icecream with pill) Feeding: Needs set up Liquids provided via: Cup;Straw Oral Phase Signs & Symptoms: Prolonged oral phase Type of cueing: Verbal Amount of cueing: Moderate   GO     Donavan Burnet, MS [facility] SLP [phone]

## 2012-09-29 NOTE — Progress Notes (Signed)
CRITICAL VALUE ALERT  Critical value received:  30  Date of notification:  09/29/2012  Time of notification:  0730  Critical value read back:yes  Nurse who received alert:  Gertie Exon  MD notified (1st page):  Dr Mahala Menghini  Time of first page:  [phone]  MD notified (2nd page):  Time of second page:  Responding MD:  Dr Mahala Menghini  Time MD responded:  [phone]

## 2012-09-29 NOTE — Progress Notes (Signed)
Physical Therapy Treatment Patient Details Name: [name] MRN: [id number] DOB: Sep 21, 1924 Today's Date: 09/29/2012 Time: 0865-7846 PT Time Calculation (min): 35 min  PT Assessment / Plan / Recommendation Comments on Treatment Session  Spouse remains hopeful that pt can go home - w/o oxygen. Pt transfered sit  to stand, ambulated down the hall and up/down stairs. Pt was min guard and tolerated tx well.    Follow Up Recommendations  Home health PT;SNF     Frequency Min 3X/week   Plan Discharge plan remains appropriate;Frequency remains appropriate    Precautions / Restrictions Precautions Precautions: Fall Restrictions Weight Bearing Restrictions: No   Pertinent Vitals/Pain  Pt c/o long standing pain in L foot   Mobility  Bed Mobility Bed Mobility: Supine to Sit Supine to Sit: 4: Min guard Details for Bed Mobility Assistance: increased time as pt struggled to complete Transfers Transfers: Sit to Stand;Stand to Sit Sit to Stand: 4: Min guard;With armrests;From bed;From chair/3-in-1 Stand to Sit: 4: Min guard Details for Transfer Assistance: 25% VC's on proper technique and hand placement esp to avoid pulling self up rising RW Ambulation/Gait Ambulation/Gait Assistance: 4: Min guard Ambulation Distance (Feet): 100 Feet (1 sitting rest break) Assistive device: Rolling walker Ambulation/Gait Assistance Details: Pt was on 2 lts nasal O2 at start of ambulation with sats at 98%. After rest break ambulated on RA. Pt c/o mild dizziness, with O2 sats of 90% during ambulation and 93% after. Gait Pattern: Step-through pattern;Decreased step length - left;Decreased step length - right;Decreased stride length;Trunk flexed Gait velocity: Slow Stairs: Yes Stairs Assistance: 4: Min guard Stair Management Technique: One rail Right;Sideways Number of Stairs: 2 Wheelchair Mobility Wheelchair Mobility: No     PT Goals Acute Rehab PT Goals PT Goal Formulation: With  patient Potential to Achieve Goals: Good Pt will go Sit to Stand: with supervision PT Goal: Sit to Stand - Progress: Progressing toward goal Pt will Ambulate: >150 feet;with supervision;with least restrictive assistive device PT Goal: Ambulate - Progress: Progressing toward goal  Visit Information  Last PT Received On: 09/29/12 Assistance Needed: +1    Cognition   Good   Balance   Fair  End of Session PT - End of Session Equipment Utilized During Treatment: Gait belt;Oxygen Activity Tolerance: Patient tolerated treatment well Patient left: in chair;with call bell/phone within reach;with chair alarm set;with family/visitor present Nurse Communication: Other (comment) (Told NT that pt was being left on RA)   GP     BROWN-SMEDLEY, NICOLE 09/29/2012, 1:33 PM  Felecia Shelling  PTA [facility]  Acute  Rehab Pager      [phone]

## 2012-09-29 NOTE — Progress Notes (Signed)
Hypoglycemic Event  CBG: 30  Treatment: D50 IV 50 mL  Symptoms: Sweaty  Follow-up CBG: Time:0740 CBG Result:126  Possible Reasons for Event: Inadequate meal intake  Comments/MD notified:Dr Samtiani    Yates Weisgerber K  Remember to initiate Hypoglycemia Order Set & complete

## 2012-09-30 LAB — CBC WITH DIFFERENTIAL/PLATELET
Basophils Absolute: 0 10*3/uL (ref 0.0–0.1)
HCT: 29.9 % — ABNORMAL LOW (ref 39.0–52.0)
Hemoglobin: 10.5 g/dL — ABNORMAL LOW (ref 13.0–17.0)
Lymphocytes Relative: 23 % (ref 12–46)
Monocytes Absolute: 1.2 10*3/uL — ABNORMAL HIGH (ref 0.1–1.0)
Monocytes Relative: 14 % — ABNORMAL HIGH (ref 3–12)
Neutro Abs: 5.5 10*3/uL (ref 1.7–7.7)
RDW: 14.4 % (ref 11.5–15.5)
WBC: 8.7 10*3/uL (ref 4.0–10.5)

## 2012-09-30 LAB — GLUCOSE, CAPILLARY
Glucose-Capillary: 112 mg/dL — ABNORMAL HIGH (ref 70–99)
Glucose-Capillary: 115 mg/dL — ABNORMAL HIGH (ref 70–99)
Glucose-Capillary: 129 mg/dL — ABNORMAL HIGH (ref 70–99)
Glucose-Capillary: 156 mg/dL — ABNORMAL HIGH (ref 70–99)

## 2012-09-30 LAB — BASIC METABOLIC PANEL
CO2: 27 mEq/L (ref 19–32)
Chloride: 104 mEq/L (ref 96–112)
Creatinine, Ser: 0.95 mg/dL (ref 0.50–1.35)

## 2012-09-30 MED ORDER — TAMSULOSIN HCL 0.4 MG PO CAPS: 0.4000 mg | ORAL_CAPSULE | Freq: Every day | ORAL | Status: DC

## 2012-09-30 MED ORDER — TAMSULOSIN HCL 0.4 MG PO CAPS
0.4000 mg | ORAL_CAPSULE | Freq: Every day | ORAL | Status: DC
  Administered 2012-09-30: 0.4 mg via ORAL
  Filled 2012-09-30: qty 1

## 2012-09-30 MED ORDER — AMOXICILLIN-POT CLAVULANATE 875-125 MG PO TABS: 1.0000 | ORAL_TABLET | Freq: Two times a day (BID) | ORAL | Status: DC

## 2012-09-30 NOTE — Progress Notes (Signed)
Physical Therapy Treatment Patient Details Name: [name] MRN: [id number] DOB: 1924/11/03 Today's Date: 09/30/2012 Time: 4098-1191 PT Time Calculation (min): 28 min  PT Assessment / Plan / Recommendation Comments on Treatment Session  Pt tolerated 150' ambulation well, on RA - with min guard - with O2 sats remaining at 95% throughout.     Follow Up Recommendations  Home health PT;SNF     Frequency Min 3X/week   Plan Discharge plan remains appropriate;Frequency remains appropriate    Precautions / Restrictions Precautions Precautions: Fall Restrictions Weight Bearing Restrictions: No   Pertinent Vitals/Pain Pt reported no pain   Mobility  Bed Mobility Bed Mobility: Supine to Sit Supine to Sit: 4: Min guard Details for Bed Mobility Assistance: increased time as pt struggled to complete Transfers Transfers: Sit to Stand;Stand to Sit Sit to Stand: 4: Min guard;From bed Stand to Sit: 4: Min guard;To chair/3-in-1;With armrests Details for Transfer Assistance: < 25% VC's on proper technique and hand placement esp to avoid pulling self up rising RW Ambulation/Gait Ambulation/Gait Assistance: 4: Min guard Ambulation Distance (Feet): 150 Feet Assistive device: Rolling walker Ambulation/Gait Assistance Details: Pt was on RA throughout treatment, O2 sats stayed steady at 95% Gait Pattern: Step-through pattern;Decreased stride length;Trunk flexed Gait velocity: Within functional limits General Gait Details: 1 standing rest break Stairs: No Wheelchair Mobility Wheelchair Mobility: No       PT Goals Acute Rehab PT Goals PT Goal Formulation: With patient Potential to Achieve Goals: Good Pt will go Sit to Stand: with supervision PT Goal: Sit to Stand - Progress: Progressing toward goal Pt will Ambulate: >150 feet;with supervision;with least restrictive assistive device PT Goal: Ambulate - Progress: Progressing toward goal  Visit Information  Last PT Received On:  09/30/12 Assistance Needed: +1    Cognition    Good   Balance   Fair +  End of Session PT - End of Session Equipment Utilized During Treatment: Gait belt Activity Tolerance: Patient tolerated treatment well Patient left: in chair;with call bell/phone within reach;with chair alarm set;with family/visitor present   GP     [name], [name] 09/30/2012, 12:28 PM  Felecia Shelling  PTA [facility]  Acute  Rehab Pager      [phone]

## 2012-09-30 NOTE — Discharge Summary (Signed)
Physician Discharge Summary  [name]:[id number] DOB: December 19, 1924 DOA: 09/24/2012  PCP: Sonda Primes, MD  Admit date: 09/24/2012 Discharge date: 09/30/2012  Time spent: 40 minutes  Recommendations for Outpatient Follow-up:  1. Recommend basic metabolic panel and CBC in about a week 2. Recommend outpatient followup with urology if needed-he was started on Flomax in the hospital and had urinary retention and may need catheter if not able to void 3. Please note his been discontinued off glipizide secondary to symptomatic hypoglycemia 4. Recommend surveillance chest x-ray in 3-4 weeks to document clearing of pneumonia 5. Patient was recommended skilled nursing placement but wife insisted on taking him home. Patient will have home health 3 times a week and was minimal guarding on discharge 6. Patient may need continued speech therapy as he did have some findings of potential aspiration-he was recommended a regular diet with thin liquids as well as compensatory mechanisms which were explained  Discharge Diagnoses:  Principal Problem:   Acute respiratory failure with hypoxia Active Problems:   VITAMIN B12 DEFICIENCY   HYPERLIPIDEMIA   ANXIETY   DEPRESSION   HYPERTENSION   Dementia   CAP (community acquired pneumonia)   Leukocytosis   Diabetes   Nausea and vomiting   Urinary retention   Discharge Condition: Good  Diet recommendation: Regular diet thin liquids speech precautions as per therapist  Filed Weights   09/27/12 0612 09/28/12 0707 09/29/12 0500  Weight: 60.8 kg (134 lb 0.6 oz) 60.782 kg (134 lb) 65.7 kg (144 lb 13.5 oz)    History of present illness:  79 year old male with past medical history of HTN, DM, Paranoia, Dementia presented to [facility] ED with generalized weakness and persistent fevers and cough for about 1 week prior to this admission. Patient was seen in PCP office and was given azithromycin 3.25 but has progressively gotten worse. He has returned to his PCP  3.28 and was given Levaquin which he never had a chance to take as he developed sudden nausea and vomiting.  In ED, patient's oxygen saturation was 89% while breathing ambient air. With 2 L nasal canula, O2 saturation increased to 97%. Further evaluation included CXR which showed right basilar infiltrates. CBC revealed mild leukocytosis of 12.6. Since admission he has had problems with delirium, agitation, and then had acute urinary retention for which a coude catheter was placed per Urology-he pulled thi sout and now has a condom catheter, but this came off and he didn't void   Hospital Course:  Aspiration vs Community acquired pneumonia  Levaquin 5 days completed. switched to Augmentin twice a day given aspiration risk and fever of 100.3 that was noted on 4.2.14-2 view CX 09/29/12 shows persisting but improved LL infiltrate-Completion date for antibiotics would be 4.8.14-speech therapy is made recommendations as below Rpt Blood cultures 4.2.14 showed no growth Sputum culture negative  Urine legionella, pneumococcal antigen negative  Swallow eval completed 3/31, per speech recommendations on : regular diet with thin liquids, encouraged soft foods and precautions with eating such as sitting up for 30 min Urinary retention/hematuria  Coude catheter placed per Urology early 3/30  Pulled cathter 4.1.14 pm  Hematuria was still present on 09/29/12 with decreasing urine output-Advised nursing to place 16 french catheter-this was replaced. Patient did have some hematuria subsequent to this and foley was placedhas clamped and if he is sensation to void he can go home without catheter and Flomax for possible outpatient followup with urology. If not he will have to continue with Foley  catheter and have this changed by home health Urology input appreciated Diabetes  Blood sugars transiently low in the 30's 4.3 am-patient symptomatic  discontinued glipizide, Continue metformin , SSI  Hb A1c 6.1 HYPERLIPIDEMIA   Continue Atorvastatin 25 qhs-changed back to home dosage of simvastatin and is sure HYPERTENSION  Continue home meds: Norvasc 10, atenolol 20 qhs, Irbesartan 300 daily ANXIETY AND DEPRESSION  Continue Clonazepam as needed Dementia with agitation/paranoia  - improved, did require Haldol briefly  Continue aricept 5 mg daily  For agitation, Ativan PRN 0.5-1 mg  Improved from behavior standpoint VITAMIN B12 and B6 DEFICIENCY  Continue B12 and B6 medication supplements per home doasge  Antibiotics:  IV levaquin 3/29-->4.2.14  Augmentin 4.2.14-->4.8.14  Discharge Exam: Filed Vitals:   09/29/12 2019 09/29/12 2137 09/30/12 0500 09/30/12 0821  BP:  113/60 124/60   Pulse:  72 60   Temp:  99.4 F (37.4 C) 98.8 F (37.1 C)   TempSrc:  Oral Oral   Resp:  20 20   Height:      Weight:      SpO2: 95% 91% 92% 96%   alert pleasant looking much better currently No specific distress much more oriented and alert. No pain  General: Alert pleasant Cardiovascular: S1-S2 no murmur rub or gallop Respiratory: Clinically clear  Discharge Instructions  Discharge Orders   Future Appointments Provider Department Dept Phone   11/16/2012 2:15 PM Tresa Garter, MD [facility] [phone]   02/13/2013 2:30 PM York Spaniel, MD [facility] [phone]   Future Orders Complete By Expires     Call MD for:  difficulty breathing, headache or visual disturbances  As directed     Call MD for:  persistant dizziness or light-headedness  As directed     Call MD for:  severe uncontrolled pain  As directed     Call MD for:  temperature >100.4  As directed     Diet - low sodium heart healthy  As directed     Increase activity slowly  As directed         Medication List    STOP taking these medications       acetaminophen 500 MG tablet  Commonly known as:  TYLENOL     diclofenac sodium 1 % Gel  Commonly known as:  VOLTAREN     glipiZIDE 10 MG 24  hr tablet  Commonly known as:  GLUCOTROL XL     levofloxacin 500 MG tablet  Commonly known as:  LEVAQUIN     potassium chloride 10 MEQ tablet  Commonly known as:  K-DUR,KLOR-CON      TAKE these medications       amLODipine 10 MG tablet  Commonly known as:  NORVASC  Take 1 tablet (10 mg total) by mouth daily.     amoxicillin-clavulanate 875-125 MG per tablet  Commonly known as:  AUGMENTIN  Take 1 tablet by mouth every 12 (twelve) hours.     aspirin EC 81 MG tablet  Take 81 mg by mouth daily.     atenolol 50 MG tablet  Commonly known as:  TENORMIN  Take 25 mg by mouth as needed. For palpitations.     Cholecalciferol 1000 UNITS capsule  Take 1,000 Units by mouth daily.     clonazePAM 0.5 MG tablet  Commonly known as:  KLONOPIN  Take 1 tablet (0.5 mg total) by mouth 3 (three) times daily as needed for anxiety. For anxiety.  cyanocobalamin 1000 MCG/ML injection  Commonly known as:  (VITAMIN B-12)  Inject 1 mL (1,000 mcg total) into the muscle every 14 (fourteen) days.     donepezil 5 MG tablet  Commonly known as:  ARICEPT  Take 5 mg by mouth at bedtime.     HYDROcodone-homatropine 5-1.5 MG/5ML syrup  Commonly known as:  HYCODAN  Take 5 mLs by mouth every 8 (eight) hours as needed for cough.     ICAPS PO  Take 2 capsules by mouth daily.     metFORMIN 1000 MG tablet  Commonly known as:  GLUCOPHAGE  Take 500-1,000 mg by mouth 2 (two) times daily with a meal. 1 tab in am, 0.5 tab in pm     simvastatin 40 MG tablet  Commonly known as:  ZOCOR  Take 1 tablet (40 mg total) by mouth at bedtime.     tamsulosin 0.4 MG Caps  Commonly known as:  FLOMAX  Take 1 capsule (0.4 mg total) by mouth daily.     valsartan-hydrochlorothiazide 320-12.5 MG per tablet  Commonly known as:  DIOVAN-HCT  Take 1 tablet by mouth daily.     VITAMIN B-6 PO  Take 1 tablet by mouth daily.     vitamin C 500 MG tablet  Commonly known as:  ASCORBIC ACID  Take 500 mg by mouth daily.      zolpidem 10 MG tablet  Commonly known as:  AMBIEN  Take 15 mg by mouth at bedtime.           Follow-up Information   Follow up with Sonda Primes, MD.   Contact information:   [address] [place] [address] [phone]        The results of significant diagnostics from this hospitalization (including imaging, microbiology, ancillary and laboratory) are listed below for reference.    Significant Diagnostic Studies: Dg Chest 2 View  09/29/2012  *RADIOLOGY REPORT*  Clinical Data: Aspiration.  Weakness and shortness of breath  CHEST - 2 VIEW  Comparison: 09/27/2012  Findings: Low lung volumes are present.  Heart size is mildly enlarged and stable.  Calcification in the aorta is again noted and unchanged.  Bibasilar density is seen left greater than right and shows some interval improvement in aeration on the left in comparison with the prior exam .  The appearance is most suspicious for bibasilar atelectasis but a left lower lobe infiltrate in the posterior segment is not completely excluded.  The remainder of the lung fields are clear. Question small left pleural effusion.  IMPRESSION: Probable bibasilar atelectasis with slight interval improvement in left lower lobe aeration. A posterior segment left lower lobe infiltrate is not excluded.   Original Report Authenticated By: Rhodia Albright, M.D.    Dg Chest 2 View  09/27/2012  *RADIOLOGY REPORT*  Clinical Data: Follow up pneumonia  CHEST - 2 VIEW  Comparison: 09/23/2012  Findings: Mild enlargement of cardiac silhouette. Atherosclerotic ossification aorta. Pulmonary vascularity normal. Slight prominent right superior mediastinum stable. Improved right basilar infiltrate. Increased atelectasis versus consolidation in left lower lobe. Mild central peribronchial thickening. Component of left pleural effusion is also present. No pneumothorax.  IMPRESSION: Improved right basilar infiltrate. Newly identified small  left pleural effusion with increased atelectasis versus consolidation in left lower lobe.   Original Report Authenticated By: Ulyses Southward, M.D.    Dg Chest 2 View  09/23/2012  *RADIOLOGY REPORT*  Clinical Data: Chest pain and cough  CHEST - 2 VIEW  Comparison: 09/06/2011  Findings: The cardiac shadow is stable.  The left lung remains clear.  Increased density is noted in the right lung base projecting in the right middle lobe consistent with acute pneumonic infiltrate.  IMPRESSION: Right basilar infiltrate.   Original Report Authenticated By: Alcide Clever, M.D.    Dg Swallowing Func-speech Pathology  09/26/2012  Chales Abrahams, CCC-SLP     09/26/2012  3:04 PM Objective Swallowing Evaluation: Modified Barium Swallowing Study   Patient Details  Name: RONNE STEFANSKI MRN: [id number] Date of Birth: 09-02-1924  Today's Date: 09/26/2012 Time: 1340-1405 SLP Time Calculation (min): 25 min  Past Medical History:  Past Medical History  Diagnosis Date  . Carotid artery stenosis   . Gait disturbance   . Anxiety   . GERD (gastroesophageal reflux disease)   . Vitamin B12 deficiency   . Depression   . Osteoarthritis   . Diabetes mellitus, type 2   . Hypertension   . Vitamin D deficiency   . Tremor   . Aortic stenosis   . Hyperlipidemia   . Diverticulosis   . Adenomatous polyp of colon    Past Surgical History:  Past Surgical History  Procedure Laterality Date  . Vasectomy    . Lumbar laminectomy      L5 Dr Ophelia Charter  . Transurethral resection of prostate  March 2007   HPI:  79 yo male admitted to [facility] with cough and weakness.   PMH + for  B12 deficiency, depression, HTN, CAP, leukocytosis, N/V, DM.   Order received for bedside swallow evaluation.  CXR 09/23/12  showed right basilar infiltrate, CT head negative.       Assessment / Plan / Recommendation Clinical Impression  Dysphagia Diagnosis: Moderate pharyngeal phase  dysphagia;Moderate oral phase dysphagia;Moderate cervical  esophageal phase dysphagia  Clinical impression: Pt  presents with moderate oropharyngeal and  cervical esophageal dysphagia, also suspect component of  esophageal deficits.  Dysphagia characterized by delayed oral  transit, decr bolus cohesion resulting in piecemeal deglutition  and minimal oral stasis.  Pharyngeal swallow characterized by  decreased pharyngeal contraction and decreased tongue base  retraction, decreased laryngeal elevation resulting in pharyngeal  stasis that mixed with secretions.   Mild laryngeal penetration  of nectar cleared with cued throat clear and both reflexive and  cued dry swallows decreased amount of pharyngeal stasis.    Please note, pt coughed during tesitng without barium visualized  in trachea.    Pt was educated to findings of test, purpose of  compensatory strategie during entire procedure.    Due to pt's weakness, multifactorial dysphagia without sensation,  weak coughhe will be a chronic aspiration, strategies in place to  mitigate risk.    SLP to follow for dysphagia management, family/pt education.   Thanks for the order.      Treatment Recommendation  TBD   Diet Recommendation Regular;Thin liquid   Liquid Administration via: Cup;Straw Medication Administration: Crushed with puree Supervision: Full supervision/cueing for compensatory strategies Compensations: Slow rate;Small sips/bites;Check for  pocketing;Multiple dry swallows after each bite/sip;Follow solids  with liquid;Clear throat intermittently (rest breaks if short of  breath or coughing) Postural Changes and/or Swallow Maneuvers: Seated upright 90  degrees;Upright 30-60 min after meal    Other  Recommendations Recommended Consults: MBS Oral Care Recommendations: Oral care before and after PO   Follow Up Recommendations  Skilled Nursing facility    Frequency and Duration min 2x/week  2 weeks   Pertinent Vitals/Pain Afebrile, decreased- weak cough  SLP Swallow Goals Patient will utilize recommended strategies during swallow to  increase swallowing safety with: Total  assistance Swallow Study Goal #2 - Progress: Progressing toward goal Goal #3: Pt's family will verbalize precautions to mitigate  aspiration risk with mod assistance.    General Date of Onset: 09/26/12 HPI: 79 yo male admitted to [facility] with cough and weakness.   PMH +  for B12 deficiency, depression, HTN, CAP, leukocytosis, N/V, DM.   Order received for bedside swallow evaluation.  CXR 09/23/12  showed right basilar infiltrate, CT head negative.   Type of Study: Modified Barium Swallowing Study Reason for Referral: Objectively evaluate swallowing function Previous Swallow Assessment: BSE today Diet Prior to this Study: Regular;Thin liquids Temperature Spikes Noted: No Respiratory Status: Supplemental O2 delivered via (comment) Behavior/Cognition: Alert;Requires cueing;Impulsive;Decreased  sustained attention (follows directions intermittently) Oral Cavity - Dentition: Adequate natural dentition Oral Motor / Sensory Function: Impaired - see Bedside swallow  eval (general weakness) Self-Feeding Abilities: Total assist Patient Positioning: Upright in chair Baseline Vocal Quality: Low vocal intensity;Hoarse Volitional Cough: Weak (nonproductive) Volitional Swallow: Unable to elicit Pharyngeal Secretions: Standing secretions in (comment)  (secretions mixed with barium retained throughout pharynx)    Reason for Referral Objectively evaluate swallowing function   Oral Phase Oral Preparation/Oral Phase Oral Phase: Impaired Oral - Nectar Oral - Nectar Teaspoon: Delayed oral transit;Lingual/palatal  residue;Piecemeal swallowing;Weak lingual manipulation;Reduced  posterior propulsion Oral - Nectar Cup: Delayed oral transit;Piecemeal swallowing;Weak  lingual manipulation;Reduced posterior propulsion Oral - Thin Oral - Thin Teaspoon: Piecemeal swallowing;Delayed oral  transit;Lingual pumping;Reduced posterior propulsion Oral - Thin Cup: Piecemeal swallowing;Delayed oral  transit;Lingual pumping;Weak lingual manipulation;Reduced   posterior propulsion Oral - Thin Straw: Piecemeal swallowing;Delayed oral  transit;Lingual/palatal residue;Weak lingual manipulation;Reduced  posterior propulsion Oral - Solids Oral - Puree: Reduced posterior propulsion;Piecemeal swallowing Oral - Regular: Impaired mastication;Delayed oral  transit;Piecemeal swallowing   Pharyngeal Phase Pharyngeal Phase Pharyngeal Phase: Impaired Pharyngeal - Nectar Pharyngeal - Nectar Teaspoon: Reduced pharyngeal  peristalsis;Premature spillage to valleculae;Pharyngeal residue -  pyriform sinuses;Pharyngeal residue - valleculae Pharyngeal - Nectar Cup: Reduced pharyngeal  peristalsis;Penetration/Aspiration during swallow;Premature  spillage to valleculae;Pharyngeal residue - pyriform  sinuses;Pharyngeal residue - valleculae Penetration/Aspiration details (nectar cup): Material enters  airway, remains ABOVE vocal cords and not ejected out (cued  throat clear removed mild penetrates) Pharyngeal - Thin Pharyngeal - Thin Teaspoon: Reduced pharyngeal  peristalsis;Premature spillage to valleculae;Pharyngeal residue -  valleculae;Pharyngeal residue - pyriform sinuses Pharyngeal - Thin Cup: Premature spillage to  valleculae;Pharyngeal residue - pyriform sinuses;Pharyngeal  residue - valleculae Pharyngeal - Thin Straw: Reduced pharyngeal peristalsis;Premature  spillage to valleculae;Pharyngeal residue - pyriform  sinuses;Pharyngeal residue - valleculae Pharyngeal - Solids Pharyngeal - Puree: Reduced pharyngeal peristalsis;Premature  spillage to valleculae;Pharyngeal residue - valleculae;Pharyngeal  residue - pyriform sinuses Pharyngeal - Regular: Premature spillage to valleculae  Cervical Esophageal Phase    GO    Cervical Esophageal Phase Cervical Esophageal Phase: Impaired Cervical Esophageal Phase - Nectar Nectar Teaspoon: Reduced cricopharyngeal relaxation Cervical Esophageal Phase - Thin Thin Teaspoon: Reduced cricopharyngeal relaxation Cervical Esophageal Phase - Solids Puree:  Reduced cricopharyngeal relaxation Regular: Reduced cricopharyngeal relaxation Cervical Esophageal Phase - Comment Cervical Esophageal Comment: Appearance of cervical osteophytes  may further impair bolus clearance, secretions retained in  pharynx without pt sensation nor ability to fully clear.   Appearance of decreased clearance distally in esophagus with  tertiary contractions on intermittent basis without pt sensation,  ? consistent with dysmotility, radiologist not present to  confirm.  Rec pt follow esophageal precautions to  maximize pt's  airway protection.           Donavan Burnet, MS [facility] SLP [phone]      Microbiology: Recent Results (from the past 240 hour(s))  URINE CULTURE     Status: None   Collection Time    09/24/12 11:05 PM      Result Value Range Status   Specimen Description URINE, CLEAN CATCH   Final   Special Requests NONE   Final   Culture  Setup Time 09/25/2012 16:05   Final   Colony Count 9,000 COLONIES/ML   Final   Culture INSIGNIFICANT GROWTH   Final   Report Status 09/26/2012 FINAL   Final  CULTURE, BLOOD (ROUTINE X 2)     Status: None   Collection Time    09/28/12 11:00 AM      Result Value Range Status   Specimen Description BLOOD LEFT ARM   Final   Special Requests BOTTLES DRAWN AEROBIC AND ANAEROBIC 10CC   Final   Culture  Setup Time 09/28/2012 15:27   Final   Culture     Final   Value:        BLOOD CULTURE RECEIVED NO GROWTH TO DATE CULTURE WILL BE HELD FOR 5 DAYS BEFORE ISSUING A FINAL NEGATIVE REPORT   Report Status PENDING   Incomplete  CULTURE, BLOOD (ROUTINE X 2)     Status: None   Collection Time    09/28/12 11:05 AM      Result Value Range Status   Specimen Description BLOOD RIGHT ARM   Final   Special Requests BOTTLES DRAWN AEROBIC AND ANAEROBIC 5CC   Final   Culture  Setup Time 09/28/2012 15:27   Final   Culture     Final   Value:        BLOOD CULTURE RECEIVED NO GROWTH TO DATE CULTURE WILL BE HELD FOR 5 DAYS BEFORE ISSUING A FINAL NEGATIVE  REPORT   Report Status PENDING   Incomplete     Labs: Basic Metabolic Panel:  Recent Labs Lab 09/24/12 1917 09/24/12 2144 09/25/12 0524 09/28/12 0505 09/30/12 0805  NA 141 140 140 137 138  K 4.2 4.2 4.0 3.0* 3.7  CL 105 105 105 101 104  CO2  --  29 26 27 27   GLUCOSE 142* 132* 75 72 154*  BUN 25* 23 21 12  24*  CREATININE 1.20 1.01 0.90 0.88 0.95  CALCIUM  --  9.4 9.1 9.4 9.1  MG  --  1.7  --   --   --   PHOS  --  3.2  --   --   --    Liver Function Tests:  Recent Labs Lab 09/24/12 2144 09/25/12 0524  AST 12 13  ALT 10 9  ALKPHOS 54 49  BILITOT 0.5 0.5  PROT 7.2 6.9  ALBUMIN 2.9* 2.7*   No results found for this basename: LIPASE, AMYLASE,  in the last 168 hours No results found for this basename: AMMONIA,  in the last 168 hours CBC:  Recent Labs Lab 09/24/12 1910 09/24/12 1917 09/24/12 2144 09/25/12 0524 09/28/12 0505 09/30/12 0805  WBC 12.6*  --  9.0 6.9 12.7* 8.7  NEUTROABS 10.3*  --  6.8  --   --  5.5  HGB 12.8* 13.3 12.2* 11.3* 11.8* 10.5*  HCT 37.9* 39.0 35.8* 33.3* 32.9* 29.9*  MCV 99.5  --  99.2 99.4 96.5 98.0  PLT 221  --  210 190 301 375   Cardiac Enzymes: No  results found for this basename: CKTOTAL, CKMB, CKMBINDEX, TROPONINI,  in the last 168 hours BNP: BNP (last 3 results) No results found for this basename: PROBNP,  in the last 8760 hours CBG:  Recent Labs Lab 09/29/12 2014 09/30/12 0019 09/30/12 0403 09/30/12 0717 09/30/12 1113  GLUCAP 138* 156* 129* 115* 135*       Signed:  Rhetta Mura  [facility] 09/30/2012, 12:21 PM

## 2012-09-30 NOTE — Progress Notes (Signed)
Met with spouse to discuss d/c planning, she chose [facility] to provide PT/SLP services. She stated she does not feel he needs OT and home health aide services. Referral made to [facility].

## 2012-10-03 ENCOUNTER — Ambulatory Visit: Payer: MEDICARE | Admitting: Physical Therapy

## 2012-10-04 LAB — CULTURE, BLOOD (ROUTINE X 2): Culture: NO GROWTH

## 2012-10-06 ENCOUNTER — Ambulatory Visit: Payer: MEDICARE | Admitting: Physical Therapy

## 2012-10-07 ENCOUNTER — Encounter: Payer: Self-pay | Admitting: Internal Medicine

## 2012-10-07 ENCOUNTER — Ambulatory Visit (INDEPENDENT_AMBULATORY_CARE_PROVIDER_SITE_OTHER): Payer: Medicare Other | Admitting: Internal Medicine

## 2012-10-07 VITALS — BP 118/70 | HR 72 | Temp 98.1°F | Resp 16 | Wt 134.0 lb

## 2012-10-07 DIAGNOSIS — I10 Essential (primary) hypertension: Secondary | ICD-10-CM

## 2012-10-07 DIAGNOSIS — F0390 Unspecified dementia without behavioral disturbance: Secondary | ICD-10-CM

## 2012-10-07 DIAGNOSIS — F329 Major depressive disorder, single episode, unspecified: Secondary | ICD-10-CM

## 2012-10-07 DIAGNOSIS — G4700 Insomnia, unspecified: Secondary | ICD-10-CM | POA: Insufficient documentation

## 2012-10-07 DIAGNOSIS — E119 Type 2 diabetes mellitus without complications: Secondary | ICD-10-CM

## 2012-10-07 DIAGNOSIS — R339 Retention of urine, unspecified: Secondary | ICD-10-CM

## 2012-10-07 DIAGNOSIS — J189 Pneumonia, unspecified organism: Secondary | ICD-10-CM

## 2012-10-07 DIAGNOSIS — E538 Deficiency of other specified B group vitamins: Secondary | ICD-10-CM

## 2012-10-07 MED ORDER — QUETIAPINE FUMARATE 25 MG PO TABS: 25.0000 mg | ORAL_TABLET | Freq: Every day | ORAL | Status: DC

## 2012-10-07 MED ORDER — ATENOLOL 25 MG PO TABS: 25.0000 mg | ORAL_TABLET | Freq: Every day | ORAL | Status: DC

## 2012-10-07 MED ORDER — CYANOCOBALAMIN 1000 MCG/ML IJ SOLN
1000.0000 ug | Freq: Once | INTRAMUSCULAR | Status: AC
  Administered 2012-10-07: 1000 ug via INTRAMUSCULAR

## 2012-10-07 NOTE — Assessment & Plan Note (Signed)
Seroquel at hs - low dose

## 2012-10-07 NOTE — Assessment & Plan Note (Signed)
Continue with current prescription therapy as reflected on the Med list.  

## 2012-10-07 NOTE — Assessment & Plan Note (Signed)
D/c [employer] Seroquel

## 2012-10-07 NOTE — Progress Notes (Signed)
Subjective:     HPI  F/u post-hospital (d/c'd a week ago):  CAP (community acquired pneumonia)   Levaquin IV daily  Follow up blood culture, respiratory culture, legionella, pneumococcal ag  Urinary retention  Coude catheter placed per URology early am  Greatly appreciate assistance  Diabetes  Continue glipizide and metformin  FU Hb A1c  HYPERLIPIDEMIA  Continue simvastatin  HYPERTENSION  Continue home meds: Norvasc, atenolol, diovan  ANXIETY AND DEPRESSION  Continue Clonazepam  Dementia  Continue aricept  For agitation, Ativan PRN VITAMIN B12 and B6 DEFICIENCY  Continue B12 and B6 medication supplements per home doasge     The patient presents for a follow-up of  chronic hypertension, chronic dyslipidemia, type 2 diabetes better controlled, gait issues, LBP 8/10 at times, may be better with medicines - Lyrica prn; not using Sinemet, Duragesic. He started Remeron  - not taking  They saw a psychiatrist - given Seroquel - did not help - they stopped Zolpidem  He was paranoid again recently about wife having an affair etc. He is supposed to be on Namenda and risperidone, but is refusing to take them. He is taking Aricept    Wt Readings from Last 3 Encounters:  10/07/12 134 lb (60.782 kg)  09/29/12 144 lb 13.5 oz (65.7 kg)  09/23/12 136 lb (61.689 kg)   BP Readings from Last 3 Encounters:  10/07/12 118/70  09/30/12 128/73  09/23/12 102/62     Review of Systems  Constitutional: Positive for fatigue. Negative for appetite change and unexpected weight change.  HENT: Negative for nosebleeds, congestion, sneezing and trouble swallowing.   Eyes: Negative for itching and visual disturbance.  Cardiovascular: Negative for palpitations and leg swelling.  Gastrointestinal: Negative for diarrhea, blood in stool and abdominal distention.  Genitourinary: Negative for frequency and hematuria.  Musculoskeletal: Positive for gait problem. Negative for joint swelling.  Skin:  Negative for rash.  Neurological: Negative for tremors and speech difficulty.  Psychiatric/Behavioral: Positive for decreased concentration. Negative for suicidal ideas, sleep disturbance, dysphoric mood and agitation. The patient is nervous/anxious.        Objective:   Physical Exam  Constitutional: He is oriented to person, place, and time. He appears well-developed. No distress.  HENT:  Mouth/Throat: Oropharynx is clear and moist.  Eyes: Conjunctivae are normal. Pupils are equal, round, and reactive to light.  Neck: Normal range of motion. No JVD present. No thyromegaly present.  Cardiovascular: Normal rate, regular rhythm, normal heart sounds and intact distal pulses.  Exam reveals no gallop and no friction rub.   No murmur heard. Pulmonary/Chest: Effort normal and breath sounds normal. No respiratory distress. He has no wheezes. He has no rales. He exhibits no tenderness.  Abdominal: Soft. Bowel sounds are normal. He exhibits no distension and no mass. There is no tenderness. There is no rebound and no guarding.  Musculoskeletal: Normal range of motion. He exhibits no edema and no tenderness.  Lymphadenopathy:    He has no cervical adenopathy.  Neurological: He is alert and oriented to person, place, and time. He displays abnormal reflex. No cranial nerve deficit. He exhibits abnormal muscle tone. Coordination abnormal.  W/c Arms w/cogwheel rigidity  Skin: Skin is warm and dry. No rash noted.  Psychiatric: His behavior is normal. Judgment and thought content normal.  depressed  B blepharitis  Not angry; calm, subdude. He is not hostile, paranoid    Lab Results  Component Value Date   WBC 8.7 09/30/2012   HGB 10.5* 09/30/2012  HCT 29.9* 09/30/2012   PLT 375 09/30/2012   GLUCOSE 154* 09/30/2012   CHOL 102 06/15/2012   TRIG 54.0 06/15/2012   HDL 47.20 06/15/2012   LDLDIRECT 141.6 07/11/2007   LDLCALC 44 06/15/2012   ALT 9 09/25/2012   AST 13 09/25/2012   NA 138 09/30/2012   K 3.7  09/30/2012   CL 104 09/30/2012   CREATININE 0.95 09/30/2012   BUN 24* 09/30/2012   CO2 27 09/30/2012   TSH 1.25 06/03/2011   PSA 1.24 10/13/2010   INR 1.00 09/24/2012   HGBA1C 6.1* 09/24/2012          Assessment & Plan:

## 2012-10-07 NOTE — Assessment & Plan Note (Signed)
Treated

## 2012-10-07 NOTE — Assessment & Plan Note (Signed)
F/u w/Dr Nesi 

## 2012-10-19 DIAGNOSIS — IMO0001 Reserved for inherently not codable concepts without codable children: Secondary | ICD-10-CM

## 2012-10-19 DIAGNOSIS — F0390 Unspecified dementia without behavioral disturbance: Secondary | ICD-10-CM

## 2012-10-19 DIAGNOSIS — R269 Unspecified abnormalities of gait and mobility: Secondary | ICD-10-CM

## 2012-10-19 DIAGNOSIS — R339 Retention of urine, unspecified: Secondary | ICD-10-CM

## 2012-10-28 ENCOUNTER — Telehealth: Payer: Self-pay | Admitting: Neurology

## 2012-10-28 NOTE — Telephone Encounter (Signed)
Spoke to spouse. Says pt's tremor has increased tremendously. Crisis Control visited with patient and advised to contact neurologist. Ellene Route to Dr. Anne Hahn asst to sched asap.

## 2012-10-31 ENCOUNTER — Ambulatory Visit (INDEPENDENT_AMBULATORY_CARE_PROVIDER_SITE_OTHER): Payer: Medicare Other | Admitting: Neurology

## 2012-10-31 ENCOUNTER — Encounter: Payer: Self-pay | Admitting: Neurology

## 2012-10-31 VITALS — BP 126/66 | HR 56

## 2012-10-31 DIAGNOSIS — I350 Nonrheumatic aortic (valve) stenosis: Secondary | ICD-10-CM | POA: Insufficient documentation

## 2012-10-31 DIAGNOSIS — I10 Essential (primary) hypertension: Secondary | ICD-10-CM

## 2012-10-31 DIAGNOSIS — E538 Deficiency of other specified B group vitamins: Secondary | ICD-10-CM

## 2012-10-31 DIAGNOSIS — K5790 Diverticulosis of intestine, part unspecified, without perforation or abscess without bleeding: Secondary | ICD-10-CM

## 2012-10-31 DIAGNOSIS — D126 Benign neoplasm of colon, unspecified: Secondary | ICD-10-CM

## 2012-10-31 DIAGNOSIS — E785 Hyperlipidemia, unspecified: Secondary | ICD-10-CM

## 2012-10-31 DIAGNOSIS — E119 Type 2 diabetes mellitus without complications: Secondary | ICD-10-CM

## 2012-10-31 DIAGNOSIS — I359 Nonrheumatic aortic valve disorder, unspecified: Secondary | ICD-10-CM

## 2012-10-31 DIAGNOSIS — R251 Tremor, unspecified: Secondary | ICD-10-CM

## 2012-10-31 DIAGNOSIS — M1990 Unspecified osteoarthritis, unspecified site: Secondary | ICD-10-CM

## 2012-10-31 DIAGNOSIS — R6889 Other general symptoms and signs: Secondary | ICD-10-CM

## 2012-10-31 DIAGNOSIS — R269 Unspecified abnormalities of gait and mobility: Secondary | ICD-10-CM

## 2012-10-31 DIAGNOSIS — R259 Unspecified abnormal involuntary movements: Secondary | ICD-10-CM

## 2012-10-31 DIAGNOSIS — F0390 Unspecified dementia without behavioral disturbance: Secondary | ICD-10-CM

## 2012-10-31 DIAGNOSIS — K5730 Diverticulosis of large intestine without perforation or abscess without bleeding: Secondary | ICD-10-CM

## 2012-10-31 NOTE — Telephone Encounter (Signed)
I called and spoke with the patient's spouse concerning patient's condition. Patient's spouse stated the patient is depressed, calling law enforecement and just not doing well.

## 2012-10-31 NOTE — Progress Notes (Signed)
Reason for visit: Dementia  [name] is an 79 y.o. male  History of present illness:  Mr. Mcjunkins is an 79 year old right-handed white male with a history of a progressive dementing illness. The patient has had a gait disorder as well, and he is using a walker for ambulation. The patient continues to have problems with confusion, and paranoia. The patient is calling the police on a regular basis. The patient still has full control over his financial assets, and he has blocked his account, causing his IRS checks to be bounced, and other bills cannot be paid. The patient is concerned that his wife of stealing from him, and that she is trying to poison him. The patient recently was in the hospital with a pneumonia, and he became confused and pulled out his urinary catheter. The patient has required an evaluation through urology. Currently, the patient is voiding fairly well. The patient denies any recent falls. The patient is not eating well, and he continues to lose weight. The patient returns to this office for an evaluation.  Past Medical History  Diagnosis Date  . Carotid artery stenosis   . Gait disturbance   . Anxiety   . GERD (gastroesophageal reflux disease)   . Vitamin B12 deficiency   . Depression   . Osteoarthritis   . Diabetes mellitus, type 2   . Hypertension   . Vitamin D deficiency   . Tremor   . Aortic stenosis   . Hyperlipidemia   . Diverticulosis   . Adenomatous polyp of colon   . Memory loss   . Polyneuropathy in diabetes(357.2)   . Neuropathy     Past Surgical History  Procedure Laterality Date  . Vasectomy    . Lumbar laminectomy      L5 Dr Ophelia Charter  . Transurethral resection of prostate  March 2007  . Cataract extraction, bilateral Bilateral   . Tonsillectomy and adenoidectomy    . Left foot      Family History  Problem Relation Age of Onset  . Diabetes Father   . Colon cancer Brother   . Heart disease Brother   . Diabetes Brother   .  Parkinson's disease Sister   . Arthritis Sister     Social history:  reports that he has quit smoking. His smoking use included Cigars. He has never used smokeless tobacco. He reports that he does not drink alcohol or use illicit drugs.  Allergies:  Allergies  Allergen Reactions  . Citalopram Hydrobromide     REACTION: "crazy"  . Duloxetine     REACTION: did not like    Medications:  Current Outpatient Prescriptions on File Prior to Visit  Medication Sig Dispense Refill  . amLODipine (NORVASC) 10 MG tablet Take 1 tablet (10 mg total) by mouth daily.  90 tablet  3  . aspirin EC 81 MG tablet Take 81 mg by mouth daily.      Marland Kitchen atenolol (TENORMIN) 25 MG tablet Take 1 tablet (25 mg total) by mouth daily.  90 tablet  3  . Cholecalciferol (VITAMIN D3) 1000 UNIT capsule Take 1,000 Units by mouth daily.       . clonazePAM (KLONOPIN) 0.5 MG tablet Take 1 tablet (0.5 mg total) by mouth 3 (three) times daily as needed for anxiety. For anxiety.  90 tablet  3  . cyanocobalamin (,VITAMIN B-12,) 1000 MCG/ML injection Inject 1 mL (1,000 mcg total) into the muscle every 14 (fourteen) days.  1 mL  11  .  donepezil (ARICEPT) 5 MG tablet Take 5 mg by mouth at bedtime.      . metFORMIN (GLUCOPHAGE) 1000 MG tablet Take 500-1,000 mg by mouth 2 (two) times daily with a meal. 1 tab in am, 0.5 tab in pm      . Multiple Vitamins-Minerals (ICAPS PO) Take 2 capsules by mouth daily.      . Pyridoxine HCl (VITAMIN B-6 PO) Take 1 tablet by mouth daily.      . simvastatin (ZOCOR) 40 MG tablet Take 1 tablet (40 mg total) by mouth at bedtime.  90 tablet  3  . tamsulosin (FLOMAX) 0.4 MG CAPS Take 1 capsule (0.4 mg total) by mouth daily.  30 capsule  1  . valsartan-hydrochlorothiazide (DIOVAN-HCT) 320-12.5 MG per tablet Take 1 tablet by mouth daily.  90 tablet  3  . vitamin C (ASCORBIC ACID) 500 MG tablet Take 500 mg by mouth daily.       No current facility-administered medications on file prior to visit.     ROS:  Out of a complete 14 system review of symptoms, the patient complains only of the following symptoms, and all other reviewed systems are negative.  Weight loss, fatigue Urinary problems Joint pain Runny nose Weakness Dizziness Depression, anxiety Disinterest in activities, paranoia  Blood pressure 126/66, pulse 56, weight 0 lb (0 kg).  Physical Exam  General: The patient is alert and cooperative at the time of the examination.  Skin: No significant peripheral edema is noted.   Neurologic Exam  Mental status: The mental status examination done today shows a total score 28/30. The patient is able to name 5 animals in 60 seconds.  Cranial nerves: Facial symmetry is present. Speech is normal, no aphasia or dysarthria is noted. Extraocular movements are full. The patient has prominent downbeat nystagmus with primary gaze. Visual fields are full.  Motor: The patient has good strength in all 4 extremities.  Coordination: The patient has good finger-nose-finger and heel-to-shin bilaterally.  Gait and station: The patient has a slightly wide-based, unsteady gait. The patient is able to walk with assistance. Tandem gait was not attempted. Romberg is negative. No drift is seen.  Reflexes: Deep tendon reflexes are symmetric.   Assessment/Plan:  One. Progressive dementing illness  2. Gait disorder  3. Downbeat nystagmus  The patient has continued to have problems with confusion, paranoia, and slight agitation. The patient apparently still is able to control all of his finances, and he is making poor decisions in this regard. The wife may need to pursue an attorney for guardianship. The patient will be sent for formal neuropsychological testing. The patient will remain on his Lexapro, and if he does well within the next week, the dose will be increased to 10 mg daily. Previously, the patient would refuse to take certain medications. The patient was given a prescription for  Seroquel, but he refused to take it. The patient will followup in 4 months.  Marlan Palau MD 10/31/2012 7:15 PM  [facility] [address] [place], [place] [address]  Phone [phone] Fax [phone]

## 2012-11-01 ENCOUNTER — Telehealth: Payer: Self-pay | Admitting: Neurology

## 2012-11-01 NOTE — Telephone Encounter (Signed)
I called and explained to the patient's spouse that she wanted her husband to be seen sooner that why his August appt. was cancelled.

## 2012-11-09 ENCOUNTER — Telehealth: Payer: Self-pay | Admitting: Neurology

## 2012-11-09 MED ORDER — ESCITALOPRAM OXALATE 10 MG PO TABS: 10.0000 mg | ORAL_TABLET | Freq: Every day | ORAL | Status: DC

## 2012-11-09 NOTE — Telephone Encounter (Signed)
Last OV says: The patient will remain on his Lexapro, and if he does well within the next week, the dose will be increased to 10 mg daily.  Marlan Palau MD  10/31/2012 7:15 PM I will update rx and send it to the pharmacy.

## 2012-11-11 DIAGNOSIS — F22 Delusional disorders: Secondary | ICD-10-CM

## 2012-11-11 DIAGNOSIS — F0390 Unspecified dementia without behavioral disturbance: Secondary | ICD-10-CM

## 2012-11-16 ENCOUNTER — Ambulatory Visit: Payer: Medicare Other | Admitting: Internal Medicine

## 2012-11-16 ENCOUNTER — Telehealth: Payer: Self-pay | Admitting: Internal Medicine

## 2012-11-16 NOTE — Telephone Encounter (Signed)
Zolpidem PA form was faxed today. Waiting on ins response.

## 2012-11-16 NOTE — Telephone Encounter (Signed)
[employer] called the patient to say they have not heard from Dr. Posey Rea about the Zopidem PA.   Has the fax come in?

## 2012-11-16 NOTE — Telephone Encounter (Signed)
PA is approved until 06/28/13. Pt's wife informed.

## 2012-11-16 NOTE — Telephone Encounter (Signed)
[name] left a vm requesting a call back to see if PCP has reviewed and explained the potential benefits with pt for Zolpidem use. I informed [name] that this has been discussed with pt by MD. PA approval still pending.

## 2012-11-16 NOTE — Telephone Encounter (Signed)
[name] called back.  Informed that we are waiting on the approval.

## 2012-11-23 ENCOUNTER — Telehealth: Payer: Self-pay | Admitting: Neurology

## 2012-11-23 MED ORDER — ESCITALOPRAM OXALATE 20 MG PO TABS: 20.0000 mg | ORAL_TABLET | Freq: Every day | ORAL | Status: DC

## 2012-11-23 NOTE — Telephone Encounter (Signed)
I called the wife. The neuropsychological evaluation revealed a mild chest, features of depression and delusional thinking. The process is felt to be true organic dementing illness, likely to get worse over time. The patient has gained some benefit behaviorally with the use of Lexapro. I'll go to the 20 mg dose of Lexapro.

## 2012-11-29 ENCOUNTER — Other Ambulatory Visit: Payer: Self-pay | Admitting: Internal Medicine

## 2012-11-29 DIAGNOSIS — M2550 Pain in unspecified joint: Secondary | ICD-10-CM

## 2012-11-29 DIAGNOSIS — E1149 Type 2 diabetes mellitus with other diabetic neurological complication: Secondary | ICD-10-CM

## 2012-12-02 ENCOUNTER — Other Ambulatory Visit (INDEPENDENT_AMBULATORY_CARE_PROVIDER_SITE_OTHER): Payer: MEDICARE

## 2012-12-02 DIAGNOSIS — E1149 Type 2 diabetes mellitus with other diabetic neurological complication: Secondary | ICD-10-CM

## 2012-12-02 DIAGNOSIS — M2550 Pain in unspecified joint: Secondary | ICD-10-CM

## 2012-12-02 LAB — HEPATIC FUNCTION PANEL
AST: 14 U/L (ref 0–37)
Alkaline Phosphatase: 52 U/L (ref 39–117)
Total Bilirubin: 1.6 mg/dL — ABNORMAL HIGH (ref 0.3–1.2)

## 2012-12-02 LAB — BASIC METABOLIC PANEL
Chloride: 106 mEq/L (ref 96–112)
Potassium: 3.8 mEq/L (ref 3.5–5.1)
Sodium: 141 mEq/L (ref 135–145)

## 2012-12-02 LAB — URIC ACID: Uric Acid, Serum: 5.3 mg/dL (ref 4.0–7.8)

## 2012-12-02 LAB — HEMOGLOBIN A1C: Hgb A1c MFr Bld: 5.5 % (ref 4.6–6.5)

## 2012-12-09 ENCOUNTER — Encounter: Payer: Self-pay | Admitting: Internal Medicine

## 2012-12-09 ENCOUNTER — Ambulatory Visit (INDEPENDENT_AMBULATORY_CARE_PROVIDER_SITE_OTHER): Payer: Medicare Other | Admitting: Internal Medicine

## 2012-12-09 VITALS — BP 120/58 | HR 76 | Temp 98.5°F | Resp 16 | Wt 132.0 lb

## 2012-12-09 DIAGNOSIS — G4700 Insomnia, unspecified: Secondary | ICD-10-CM

## 2012-12-09 DIAGNOSIS — E785 Hyperlipidemia, unspecified: Secondary | ICD-10-CM

## 2012-12-09 DIAGNOSIS — E538 Deficiency of other specified B group vitamins: Secondary | ICD-10-CM

## 2012-12-09 DIAGNOSIS — E119 Type 2 diabetes mellitus without complications: Secondary | ICD-10-CM

## 2012-12-09 DIAGNOSIS — F0391 Unspecified dementia with behavioral disturbance: Secondary | ICD-10-CM

## 2012-12-09 DIAGNOSIS — F411 Generalized anxiety disorder: Secondary | ICD-10-CM

## 2012-12-09 DIAGNOSIS — I10 Essential (primary) hypertension: Secondary | ICD-10-CM

## 2012-12-09 LAB — GLUCOSE, POCT (MANUAL RESULT ENTRY): POC Glucose: 123 mg/dl — AB (ref 70–99)

## 2012-12-09 MED ORDER — SIMVASTATIN 40 MG PO TABS: 40.0000 mg | ORAL_TABLET | Freq: Every day | ORAL | Status: DC

## 2012-12-09 MED ORDER — TAMSULOSIN HCL 0.4 MG PO CAPS: 0.4000 mg | ORAL_CAPSULE | Freq: Every day | ORAL | Status: DC

## 2012-12-09 MED ORDER — GLIPIZIDE ER 10 MG PO TB24: 10.0000 mg | ORAL_TABLET | Freq: Every day | ORAL | Status: DC

## 2012-12-09 MED ORDER — VALSARTAN-HYDROCHLOROTHIAZIDE 320-12.5 MG PO TABS: 1.0000 | ORAL_TABLET | Freq: Every day | ORAL | Status: DC

## 2012-12-09 MED ORDER — CYANOCOBALAMIN 1000 MCG/ML IJ SOLN
1000.0000 ug | Freq: Once | INTRAMUSCULAR | Status: AC
  Administered 2012-12-09: 1000 ug via INTRAMUSCULAR

## 2012-12-09 MED ORDER — DONEPEZIL HCL 5 MG PO TABS: 5.0000 mg | ORAL_TABLET | Freq: Every day | ORAL | Status: DC

## 2012-12-09 MED ORDER — METFORMIN HCL 1000 MG PO TABS: 500.0000 mg | ORAL_TABLET | Freq: Two times a day (BID) | ORAL | Status: DC

## 2012-12-09 MED ORDER — ZOLPIDEM TARTRATE 10 MG PO TABS: 15.0000 mg | ORAL_TABLET | Freq: Every evening | ORAL | Status: DC | PRN

## 2012-12-09 MED ORDER — POTASSIUM CHLORIDE ER 10 MEQ PO TBCR: 10.0000 meq | EXTENDED_RELEASE_TABLET | Freq: Every day | ORAL | Status: DC

## 2012-12-09 NOTE — Progress Notes (Signed)
   Subjective:     HPI C/o nasal clear d/c The patient presents for a follow-up of  chronic hypertension, chronic dyslipidemia, type 2 diabetes better controlled, gait issues, LBP 8/10 at times, may be better with medicines - Lyrica prn; not using Sinemet, Duragesic. He started Remeron  - not taking  They saw a psychiatrist - given Seroquel - did not help - they stopped Zolpidem  He was paranoid again recently about wife having an affair etc. He is supposed to be on Namenda and risperidone, but is refusing to take them. He is taking Aricept    Wt Readings from Last 3 Encounters:  12/09/12 132 lb (59.875 kg)  10/07/12 134 lb (60.782 kg)  09/29/12 144 lb 13.5 oz (65.7 kg)   BP Readings from Last 3 Encounters:  12/09/12 120/58  10/31/12 126/66  10/07/12 118/70     Review of Systems  Constitutional: Positive for fatigue. Negative for appetite change and unexpected weight change.  HENT: Negative for nosebleeds, congestion, sneezing and trouble swallowing.   Eyes: Negative for itching and visual disturbance.  Cardiovascular: Negative for palpitations and leg swelling.  Gastrointestinal: Negative for diarrhea, blood in stool and abdominal distention.  Genitourinary: Negative for frequency and hematuria.  Musculoskeletal: Positive for gait problem. Negative for joint swelling.  Skin: Negative for rash.  Neurological: Negative for tremors and speech difficulty.  Psychiatric/Behavioral: Positive for decreased concentration. Negative for suicidal ideas, sleep disturbance, dysphoric mood and agitation. The patient is nervous/anxious.        Objective:   Physical Exam  Constitutional: He is oriented to person, place, and time. He appears well-developed. No distress.  HENT:  Mouth/Throat: Oropharynx is clear and moist.  Eyes: Conjunctivae are normal. Pupils are equal, round, and reactive to light.  Neck: Normal range of motion. No JVD present. No thyromegaly present.  Cardiovascular:  Normal rate, regular rhythm, normal heart sounds and intact distal pulses.  Exam reveals no gallop and no friction rub.   No murmur heard. Pulmonary/Chest: Effort normal and breath sounds normal. No respiratory distress. He has no wheezes. He has no rales. He exhibits no tenderness.  Abdominal: Soft. Bowel sounds are normal. He exhibits no distension and no mass. There is no tenderness. There is no rebound and no guarding.  Musculoskeletal: Normal range of motion. He exhibits no edema and no tenderness.  Lymphadenopathy:    He has no cervical adenopathy.  Neurological: He is alert and oriented to person, place, and time. He displays abnormal reflex. No cranial nerve deficit. He exhibits abnormal muscle tone. Coordination abnormal.  W/c Arms w/cogwheel rigidity  Skin: Skin is warm and dry. No rash noted.  Psychiatric: His behavior is normal. Judgment and thought content normal.  depressed    Subjective:       Assessment & Plan:      Assessment & Plan:

## 2012-12-09 NOTE — Assessment & Plan Note (Signed)
Continue with current prescription therapy as reflected on the Med list.  

## 2012-12-14 ENCOUNTER — Other Ambulatory Visit: Payer: Self-pay | Admitting: *Deleted

## 2012-12-14 MED ORDER — METFORMIN HCL 500 MG PO TABS: 500.0000 mg | ORAL_TABLET | Freq: Every day | ORAL | Status: DC

## 2012-12-18 ENCOUNTER — Emergency Department (HOSPITAL_COMMUNITY): Payer: MEDICARE

## 2012-12-18 ENCOUNTER — Other Ambulatory Visit: Payer: Self-pay

## 2012-12-18 ENCOUNTER — Inpatient Hospital Stay (HOSPITAL_COMMUNITY)
Admission: EM | Admit: 2012-12-18 | Discharge: 2012-12-20 | DRG: 690 | Disposition: A | Discharge status: Home / Self Care | Payer: MEDICARE | Attending: Internal Medicine | Admitting: Internal Medicine

## 2012-12-18 ENCOUNTER — Other Ambulatory Visit: Payer: Self-pay | Admitting: Internal Medicine

## 2012-12-18 ENCOUNTER — Encounter (HOSPITAL_COMMUNITY): Payer: Self-pay | Admitting: Emergency Medicine

## 2012-12-18 DIAGNOSIS — E1149 Type 2 diabetes mellitus with other diabetic neurological complication: Secondary | ICD-10-CM | POA: Diagnosis present

## 2012-12-18 DIAGNOSIS — F0392 Unspecified dementia, unspecified severity, with psychotic disturbance: Secondary | ICD-10-CM | POA: Diagnosis present

## 2012-12-18 DIAGNOSIS — F329 Major depressive disorder, single episode, unspecified: Secondary | ICD-10-CM | POA: Diagnosis present

## 2012-12-18 DIAGNOSIS — J9600 Acute respiratory failure, unspecified whether with hypoxia or hypercapnia: Secondary | ICD-10-CM

## 2012-12-18 DIAGNOSIS — E1142 Type 2 diabetes mellitus with diabetic polyneuropathy: Secondary | ICD-10-CM | POA: Diagnosis present

## 2012-12-18 DIAGNOSIS — F0390 Unspecified dementia without behavioral disturbance: Secondary | ICD-10-CM | POA: Diagnosis present

## 2012-12-18 DIAGNOSIS — R269 Unspecified abnormalities of gait and mobility: Secondary | ICD-10-CM

## 2012-12-18 DIAGNOSIS — F03918 Unspecified dementia, unspecified severity, with other behavioral disturbance: Secondary | ICD-10-CM | POA: Diagnosis present

## 2012-12-18 DIAGNOSIS — F3289 Other specified depressive episodes: Secondary | ICD-10-CM

## 2012-12-18 DIAGNOSIS — J9601 Acute respiratory failure with hypoxia: Secondary | ICD-10-CM

## 2012-12-18 DIAGNOSIS — F0391 Unspecified dementia with behavioral disturbance: Secondary | ICD-10-CM | POA: Diagnosis present

## 2012-12-18 DIAGNOSIS — E785 Hyperlipidemia, unspecified: Secondary | ICD-10-CM | POA: Diagnosis present

## 2012-12-18 DIAGNOSIS — E538 Deficiency of other specified B group vitamins: Secondary | ICD-10-CM | POA: Diagnosis present

## 2012-12-18 DIAGNOSIS — R079 Chest pain, unspecified: Secondary | ICD-10-CM

## 2012-12-18 DIAGNOSIS — Z87891 Personal history of nicotine dependence: Secondary | ICD-10-CM

## 2012-12-18 DIAGNOSIS — N390 Urinary tract infection, site not specified: Principal | ICD-10-CM

## 2012-12-18 DIAGNOSIS — K219 Gastro-esophageal reflux disease without esophagitis: Secondary | ICD-10-CM | POA: Diagnosis present

## 2012-12-18 DIAGNOSIS — I498 Other specified cardiac arrhythmias: Secondary | ICD-10-CM | POA: Diagnosis present

## 2012-12-18 DIAGNOSIS — E876 Hypokalemia: Secondary | ICD-10-CM | POA: Diagnosis present

## 2012-12-18 DIAGNOSIS — F411 Generalized anxiety disorder: Secondary | ICD-10-CM

## 2012-12-18 DIAGNOSIS — I10 Essential (primary) hypertension: Secondary | ICD-10-CM

## 2012-12-18 DIAGNOSIS — E119 Type 2 diabetes mellitus without complications: Secondary | ICD-10-CM

## 2012-12-18 LAB — CBC WITH DIFFERENTIAL/PLATELET
Basophils Absolute: 0 10*3/uL (ref 0.0–0.1)
Basophils Relative: 0 % (ref 0–1)
Hemoglobin: 11.8 g/dL — ABNORMAL LOW (ref 13.0–17.0)
MCHC: 34.2 g/dL (ref 30.0–36.0)
Monocytes Relative: 11 % (ref 3–12)
Neutro Abs: 3.1 10*3/uL (ref 1.7–7.7)
Neutrophils Relative %: 53 % (ref 43–77)
RBC: 3.57 MIL/uL — ABNORMAL LOW (ref 4.22–5.81)
WBC: 5.7 10*3/uL (ref 4.0–10.5)

## 2012-12-18 LAB — COMPREHENSIVE METABOLIC PANEL
ALT: 14 U/L (ref 0–53)
Calcium: 10 mg/dL (ref 8.4–10.5)
Chloride: 102 mEq/L (ref 96–112)
Creatinine, Ser: 0.88 mg/dL (ref 0.50–1.35)
GFR calc Af Amer: 88 mL/min — ABNORMAL LOW (ref >90)
GFR calc non Af Amer: 76 mL/min — ABNORMAL LOW (ref >90)
Potassium: 3.6 mEq/L (ref 3.5–5.1)
Total Protein: 7.4 g/dL (ref 6.0–8.3)

## 2012-12-18 LAB — D-DIMER, QUANTITATIVE: D-Dimer, Quant: 0.32 ug/mL-FEU (ref 0.00–0.48)

## 2012-12-18 LAB — TROPONIN I: Troponin I: <0.3 ng/mL (ref <0.30)

## 2012-12-18 LAB — URINALYSIS, ROUTINE W REFLEX MICROSCOPIC
Ketones, ur: NEGATIVE mg/dL
Nitrite: POSITIVE — AB
Protein, ur: 100 mg/dL — AB
Urobilinogen, UA: 0.2 mg/dL (ref 0.0–1.0)

## 2012-12-18 LAB — URINE MICROSCOPIC-ADD ON

## 2012-12-18 LAB — PRO B NATRIURETIC PEPTIDE: Pro B Natriuretic peptide (BNP): 229.4 pg/mL (ref 0–450)

## 2012-12-18 MED ORDER — IRBESARTAN 300 MG PO TABS
300.0000 mg | ORAL_TABLET | Freq: Every day | ORAL | Status: DC
  Administered 2012-12-19 – 2012-12-20 (×2): 300 mg via ORAL
  Filled 2012-12-18 (×2): qty 1

## 2012-12-18 MED ORDER — DEXTROSE 5 % IV SOLN
1.0000 g | INTRAVENOUS | Status: DC
  Administered 2012-12-19 – 2012-12-20 (×2): 1 g via INTRAVENOUS
  Filled 2012-12-18 (×2): qty 10

## 2012-12-18 MED ORDER — METFORMIN HCL 500 MG PO TABS
1000.0000 mg | ORAL_TABLET | Freq: Every day | ORAL | Status: DC
  Administered 2012-12-20: 1000 mg via ORAL
  Filled 2012-12-18 (×3): qty 2

## 2012-12-18 MED ORDER — ASPIRIN EC 81 MG PO TBEC
81.0000 mg | DELAYED_RELEASE_TABLET | Freq: Every day | ORAL | Status: DC
  Administered 2012-12-19 – 2012-12-20 (×3): 81 mg via ORAL
  Filled 2012-12-18 (×3): qty 1

## 2012-12-18 MED ORDER — TAMSULOSIN HCL 0.4 MG PO CAPS
0.4000 mg | ORAL_CAPSULE | Freq: Every day | ORAL | Status: DC
  Administered 2012-12-19 – 2012-12-20 (×2): 0.4 mg via ORAL
  Filled 2012-12-18 (×2): qty 1

## 2012-12-18 MED ORDER — VALSARTAN-HYDROCHLOROTHIAZIDE 320-12.5 MG PO TABS: 1.0000 | ORAL_TABLET | Freq: Every day | ORAL | Status: DC

## 2012-12-18 MED ORDER — ATENOLOL 25 MG PO TABS
25.0000 mg | ORAL_TABLET | Freq: Every day | ORAL | Status: DC
  Filled 2012-12-18 (×2): qty 1

## 2012-12-18 MED ORDER — AMLODIPINE BESYLATE 10 MG PO TABS
10.0000 mg | ORAL_TABLET | Freq: Every day | ORAL | Status: DC
  Administered 2012-12-20: 10 mg via ORAL
  Filled 2012-12-18 (×2): qty 1

## 2012-12-18 MED ORDER — DICLOFENAC SODIUM 1 % TD GEL
2.0000 g | Freq: Two times a day (BID) | TRANSDERMAL | Status: DC | PRN
  Filled 2012-12-18: qty 100

## 2012-12-18 MED ORDER — METFORMIN HCL 500 MG PO TABS
500.0000 mg | ORAL_TABLET | Freq: Every day | ORAL | Status: DC
  Filled 2012-12-18 (×2): qty 1

## 2012-12-18 MED ORDER — DONEPEZIL HCL 5 MG PO TABS
5.0000 mg | ORAL_TABLET | Freq: Every day | ORAL | Status: DC
  Administered 2012-12-19 (×2): 5 mg via ORAL
  Filled 2012-12-18 (×3): qty 1

## 2012-12-18 MED ORDER — SODIUM CHLORIDE 0.9 % IV SOLN
INTRAVENOUS | Status: DC
  Administered 2012-12-18: 20:00:00 via INTRAVENOUS

## 2012-12-18 MED ORDER — GLIPIZIDE ER 10 MG PO TB24
10.0000 mg | ORAL_TABLET | Freq: Every day | ORAL | Status: DC
  Administered 2012-12-20: 10 mg via ORAL
  Filled 2012-12-18 (×3): qty 1

## 2012-12-18 MED ORDER — HYDROCODONE-ACETAMINOPHEN 5-325 MG PO TABS
1.0000 | ORAL_TABLET | ORAL | Status: DC | PRN
  Administered 2012-12-20: 1 via ORAL
  Filled 2012-12-18: qty 1

## 2012-12-18 MED ORDER — ESCITALOPRAM OXALATE 20 MG PO TABS
20.0000 mg | ORAL_TABLET | Freq: Every day | ORAL | Status: DC
  Administered 2012-12-19 – 2012-12-20 (×2): 20 mg via ORAL
  Filled 2012-12-18 (×2): qty 1

## 2012-12-18 MED ORDER — ENOXAPARIN SODIUM 40 MG/0.4ML ~~LOC~~ SOLN
40.0000 mg | Freq: Every day | SUBCUTANEOUS | Status: DC
  Administered 2012-12-19 (×2): 40 mg via SUBCUTANEOUS
  Filled 2012-12-18 (×3): qty 0.4

## 2012-12-18 MED ORDER — ONDANSETRON HCL 4 MG/2ML IJ SOLN: 4.0000 mg | Freq: Four times a day (QID) | INTRAMUSCULAR | Status: DC | PRN

## 2012-12-18 MED ORDER — MORPHINE SULFATE 2 MG/ML IJ SOLN: 1.0000 mg | INTRAMUSCULAR | Status: DC | PRN

## 2012-12-18 MED ORDER — HYDROCHLOROTHIAZIDE 12.5 MG PO CAPS
12.5000 mg | ORAL_CAPSULE | Freq: Every day | ORAL | Status: DC
  Administered 2012-12-20: 12.5 mg via ORAL
  Filled 2012-12-18 (×2): qty 1

## 2012-12-18 MED ORDER — SODIUM CHLORIDE 0.9 % IJ SOLN
3.0000 mL | Freq: Two times a day (BID) | INTRAMUSCULAR | Status: DC
  Administered 2012-12-19 – 2012-12-20 (×4): 3 mL via INTRAVENOUS

## 2012-12-18 MED ORDER — ZOLPIDEM TARTRATE 5 MG PO TABS
15.0000 mg | ORAL_TABLET | Freq: Every evening | ORAL | Status: DC | PRN
  Administered 2012-12-19: 15 mg via ORAL
  Filled 2012-12-18: qty 1
  Filled 2012-12-18: qty 3

## 2012-12-18 MED ORDER — SIMVASTATIN 40 MG PO TABS
40.0000 mg | ORAL_TABLET | Freq: Every day | ORAL | Status: DC
  Administered 2012-12-19: 40 mg via ORAL
  Filled 2012-12-18 (×2): qty 1

## 2012-12-18 MED ORDER — POTASSIUM CHLORIDE ER 10 MEQ PO TBCR
10.0000 meq | EXTENDED_RELEASE_TABLET | Freq: Every day | ORAL | Status: DC
  Filled 2012-12-18: qty 1

## 2012-12-18 MED ORDER — CLONAZEPAM 0.5 MG PO TABS
0.5000 mg | ORAL_TABLET | Freq: Three times a day (TID) | ORAL | Status: DC | PRN
Start: 2012-12-18 — End: 2012-12-20
  Administered 2012-12-19 – 2012-12-20 (×4): 0.5 mg via ORAL
  Filled 2012-12-18 (×4): qty 1

## 2012-12-18 MED ORDER — ONDANSETRON HCL 4 MG PO TABS: 4.0000 mg | ORAL_TABLET | Freq: Four times a day (QID) | ORAL | Status: DC | PRN

## 2012-12-18 MED ORDER — INSULIN ASPART 100 UNIT/ML ~~LOC~~ SOLN
0.0000 [IU] | Freq: Three times a day (TID) | SUBCUTANEOUS | Status: DC
  Administered 2012-12-19: 1 [IU] via SUBCUTANEOUS
  Administered 2012-12-20: 2 [IU] via SUBCUTANEOUS

## 2012-12-18 NOTE — ED Notes (Signed)
Per pt/family: Pt c/o right sided chest pain with dizziness, nausea, back pain, and weakness. Pt also c/o head, neck, and back pain. Pt also reports a foul smell to his urine.

## 2012-12-18 NOTE — ED Provider Notes (Signed)
History     CSN: [id number]  Arrival date & time 12/18/12  1932   First MD Initiated Contact with Patient 12/18/12 1937      Chief Complaint  Patient presents with  . Chest Pain  . Back Pain  . Neck Pain  . Headache    (Consider location/radiation/quality/duration/timing/severity/associated sxs/prior treatment) HPI Comments: Comes to the ER for evaluation of chest pain. Patient has had right-sided chest pain for a couple of hours. Patient was sitting when the pain began. It is not associated with shortness of breath. Patient has not identified any exacerbating or alleviating factors. He has been very weak today. He reports dizziness with nausea but no vomiting. Patient is complaining of right-sided headache associated with the symptoms. No recent falls. Patient has had dark and foul-smelling urine today. There has been no fever.  Patient is a 79 y.o. male presenting with chest pain, back pain, neck pain, and headaches.  Chest Pain Associated symptoms: back pain, dizziness and headache   Associated symptoms: no shortness of breath   Back Pain Associated symptoms: chest pain and headaches   Neck Pain Associated symptoms: chest pain and headaches   Headache Associated symptoms: back pain, dizziness and neck pain     Past Medical History  Diagnosis Date  . Carotid artery stenosis   . Gait disturbance   . Anxiety   . GERD (gastroesophageal reflux disease)   . Vitamin B12 deficiency   . Depression   . Osteoarthritis   . Diabetes mellitus, type 2   . Hypertension   . Vitamin D deficiency   . Tremor   . Aortic stenosis   . Hyperlipidemia   . Diverticulosis   . Adenomatous polyp of colon   . Memory loss   . Polyneuropathy in diabetes(357.2)   . Neuropathy     Past Surgical History  Procedure Laterality Date  . Vasectomy    . Lumbar laminectomy      L5 Dr Ophelia Charter  . Transurethral resection of prostate  March 2007  . Cataract extraction, bilateral Bilateral   .  Tonsillectomy and adenoidectomy    . Left foot      Family History  Problem Relation Age of Onset  . Diabetes Father   . Colon cancer Brother   . Heart disease Brother   . Diabetes Brother   . Parkinson's disease Sister   . Arthritis Sister     History  Substance Use Topics  . Smoking status: Former Smoker    Types: Cigars  . Smokeless tobacco: Never Used  . Alcohol Use: No      Review of Systems  HENT: Positive for neck pain.   Respiratory: Negative for shortness of breath.   Cardiovascular: Positive for chest pain.  Musculoskeletal: Positive for back pain.  Neurological: Positive for dizziness and headaches.  All other systems reviewed and are negative.    Allergies  Citalopram hydrobromide and Duloxetine  Home Medications   Current Outpatient Rx  Name  Route  Sig  Dispense  Refill  . amLODipine (NORVASC) 10 MG tablet   Oral   Take 1 tablet (10 mg total) by mouth daily.   90 tablet   3   . aspirin EC 81 MG tablet   Oral   Take 81 mg by mouth daily.         Marland Kitchen atenolol (TENORMIN) 25 MG tablet   Oral   Take 1 tablet (25 mg total) by mouth daily.   90 tablet  3   . Cholecalciferol (VITAMIN D3) 1000 UNIT capsule   Oral   Take 1,000 Units by mouth daily.          . clonazePAM (KLONOPIN) 0.5 MG tablet   Oral   Take 1 tablet (0.5 mg total) by mouth 3 (three) times daily as needed for anxiety. For anxiety.   90 tablet   3   . diclofenac sodium (VOLTAREN) 1 % GEL   Topical   Apply 2 g topically 2 (two) times daily as needed.         . donepezil (ARICEPT) 5 MG tablet   Oral   Take 1 tablet (5 mg total) by mouth at bedtime.   90 tablet   3   . escitalopram (LEXAPRO) 20 MG tablet   Oral   Take 1 tablet (20 mg total) by mouth daily.   30 tablet   5   . glipiZIDE (GLUCOTROL XL) 10 MG 24 hr tablet   Oral   Take 1 tablet (10 mg total) by mouth daily.   90 tablet   3   . metFORMIN (GLUCOPHAGE) 500 MG tablet   Oral   Take 1,000 mg by  mouth daily with breakfast. 1000mg  every morning and 500 mg at night         . Multiple Vitamins-Minerals (ICAPS PO)   Oral   Take 2 capsules by mouth daily.         . potassium chloride (K-DUR) 10 MEQ tablet   Oral   Take 1 tablet (10 mEq total) by mouth daily.   90 tablet   3   . Pyridoxine HCl (VITAMIN B-6 PO)   Oral   Take 1 tablet by mouth daily.         . simvastatin (ZOCOR) 40 MG tablet   Oral   Take 1 tablet (40 mg total) by mouth at bedtime.   90 tablet   3   . tamsulosin (FLOMAX) 0.4 MG CAPS   Oral   Take 1 capsule (0.4 mg total) by mouth daily.   90 capsule   3   . valsartan-hydrochlorothiazide (DIOVAN-HCT) 320-12.5 MG per tablet   Oral   Take 1 tablet by mouth daily.   90 tablet   3   . vitamin C (ASCORBIC ACID) 500 MG tablet   Oral   Take 500 mg by mouth daily.         Marland Kitchen zolpidem (AMBIEN) 10 MG tablet   Oral   Take 1.5 tablets (15 mg total) by mouth at bedtime as needed for sleep.   135 tablet   1   . cyanocobalamin (,VITAMIN B-12,) 1000 MCG/ML injection   Intramuscular   Inject 1 mL (1,000 mcg total) into the muscle every 14 (fourteen) days.   1 mL   11     BP 156/60  Pulse 62  Temp(Src) 97.6 F (36.4 C) (Oral)  Resp 18  SpO2 100%  Physical Exam  Constitutional: He is oriented to person, place, and time. He appears well-developed and well-nourished. No distress.  HENT:  Head: Normocephalic and atraumatic.  Right Ear: Hearing normal.  Left Ear: Hearing normal.  Nose: Nose normal.  Mouth/Throat: Oropharynx is clear and moist and mucous membranes are normal.  Eyes: Conjunctivae and EOM are normal. Pupils are equal, round, and reactive to light.  Neck: Normal range of motion. Neck supple.  Cardiovascular: Regular rhythm, S1 normal and S2 normal.  Exam reveals no gallop and no friction rub.  No murmur heard. Pulmonary/Chest: Effort normal and breath sounds normal. No respiratory distress. He exhibits no tenderness.  Abdominal:  Soft. Normal appearance and bowel sounds are normal. There is no hepatosplenomegaly. There is no tenderness. There is no rebound, no guarding, no tenderness at McBurney's point and negative Murphy's sign. No hernia.  Musculoskeletal: Normal range of motion.  Neurological: He is alert and oriented to person, place, and time. He has normal strength. No cranial nerve deficit or sensory deficit. Coordination normal. GCS eye subscore is 4. GCS verbal subscore is 5. GCS motor subscore is 6.  Skin: Skin is warm, dry and intact. No rash noted. No cyanosis.  Psychiatric: He has a normal mood and affect. His speech is normal and behavior is normal. Thought content normal.    ED Course  Procedures (including critical care time)  EKG:  Date: 12/18/2012  Rate: 45  Rhythm: sinus brady vs junctional  QRS Axis: normal  Intervals: normal  ST/T Wave abnormalities: nonspecific ST/T changes  Conduction Disutrbances:none  Narrative Interpretation:   Old EKG Reviewed: previous AFIB    Labs Reviewed  CBC WITH DIFFERENTIAL - Abnormal; Notable for the following:    RBC 3.57 (*)    Hemoglobin 11.8 (*)    HCT 34.5 (*)    All other components within normal limits  COMPREHENSIVE METABOLIC PANEL - Abnormal; Notable for the following:    GFR calc non Af Amer 76 (*)    GFR calc Af Amer 88 (*)    All other components within normal limits  URINALYSIS, ROUTINE W REFLEX MICROSCOPIC - Abnormal; Notable for the following:    Protein, ur 100 (*)    Nitrite POSITIVE (*)    Leukocytes, UA TRACE (*)    All other components within normal limits  URINE MICROSCOPIC-ADD ON - Abnormal; Notable for the following:    Bacteria, UA FEW (*)    All other components within normal limits  URINE CULTURE  PRO B NATRIURETIC PEPTIDE  TROPONIN I  D-DIMER, QUANTITATIVE   Dg Chest 2 View  12/18/2012   *RADIOLOGY REPORT*  Clinical Data: Chest pain.  CHEST - 2 VIEW  Comparison: 09/29/2012  Findings: Heart size and pulmonary  vascularity are normal. Calcification in the thoracic aorta.  The lungs are clear.  No effusions.  No acute osseous abnormality.  IMPRESSION: No acute disease.   Original Report Authenticated By: Francene Boyers, M.D.   Ct Head Wo Contrast  12/18/2012   *RADIOLOGY REPORT*  Clinical Data: Right-sided headache  CT HEAD WITHOUT CONTRAST  Technique:  Contiguous axial images were obtained from the base of the skull through the vertex without contrast.  Comparison: CT 03/22/2012  Findings: Moderate atrophy and moderate chronic microvascular ischemic change in the white matter.  Chronic lacuna in the deep white matter on the right is unchanged.  Negative for acute infarct, hemorrhage, or mass.  Calvarium is intact.  There is atherosclerotic disease in the vertebral and carotid arteries.  IMPRESSION: Atrophy and chronic ischemia.  No acute abnormality.   Original Report Authenticated By: Janeece Riggers, M.D.     Diagnosis: 1. Chest pain 2. Bradycardia 3. Headache 4. UTI    MDM  Patient presented to the ER with multiple complaints. Patient complains of right-sided chest pain that began a couple of hours ago. He cannot describe the pain in any detail. There is no associated shortness of breath. Patient's EKG did not show any acute ischemia or infarct. Troponin was negative. A d-dimer was ordered which was  also normal. Patient is not tachycardic, not short of breath and felt to be low likelihood for PE. This has multiple cardiac risk factors, and although his symptoms are atypical, will require further evaluation.  Patient did complain of headache. There are no neurologic findings. CT head was unremarkable.  Patient does complain of urinary symptoms. Urinalysis clearly shows signs of infection.        Gilda Crease, MD 12/18/12 2213

## 2012-12-18 NOTE — H&P (Signed)
[facility] History and Physical  [name] ZOX:[id number] DOB: 1925/01/20 DOA: 12/18/2012  Referring physician: ER physician PCP: Sonda Primes, MD   Chief Complaint: Chest pain   HPI:  Pt is 79 yo male with HTN, HLD, DM type II, presented to [facility] ED with main concern or sudden onset right side chest pain, started several hours prior to admission and at rest. Pt explains discomfort is pressure - like, non radiating, 4/10 in severity, constant, no specific alleviating or aggravating factors, no similar events in the past. Pt denies other symptoms such as fevers, chills, abdominal or urinary concerns. He explains he has dizziness but that comes and goes and he can not tell if it is related to chest discomfort.   In ED, pt with persistent discomfort, TRH asked to admit for chest pain rule out. ? UTI based on urinalysis.  Principal Problem:   Chest pain - unclear etiology but certainly worrisome for ACS given multiple risk factors: HTN, HLD, DM2, age - will admit to telemetry bed - provide supportive care with analgesia and oxygen as needed - monitor vital signs per floor protocol - continue aspirin for now - will obtain TSH, 12 lead EKG, cycle CE's - consider cardiology input in AM of pain still present  Active Problems:   ? UTI - pt has no specific urological symptoms but UA is suggestive of UTI - will treat empirically and will need to follow up on urine cultures    HYPERLIPIDEMIA - continue statin    DEPRESSION - appears to be stable, continue home medical regimen    HYPERTENSION - continue Norvasc, Atenolol, Diovan    Dementia - continue Aricept    Diabetes mellitus, type 2 - continue Metformin, Glipizide - will also add SSI sensitive coverage for now - will check A1C  Review of Systems:  Constitutional: Negative for fever, chills and malaise/fatigue. Negative for diaphoresis.  HENT: Negative for hearing loss, ear pain, nosebleeds, congestion, sore throat,  neck pain, tinnitus and ear discharge.   Eyes: Negative for blurred vision, double vision, photophobia, pain, discharge and redness.  Respiratory: Negative for cough, hemoptysis, sputum production, shortness of breath, wheezing and stridor.   Cardiovascular: Negative for palpitations, orthopnea, claudication and leg swelling.  Gastrointestinal: Negative for nausea, vomiting and abdominal pain. Negative for heartburn, constipation, blood in stool and melena.  Genitourinary: Negative for dysuria, urgency, frequency, hematuria and flank pain.  Musculoskeletal: Negative for myalgias, joint pain and falls.  Skin: Negative for itching and rash.  Neurological: Negative for tingling, tremors, sensory change, speech change, focal weakness, loss of consciousness.  Endo/Heme/Allergies: Negative for environmental allergies and polydipsia. Does not bruise/bleed easily.  Psychiatric/Behavioral: Negative for suicidal ideas. The patient is not nervous/anxious.      Past Medical History  Diagnosis Date  . Carotid artery stenosis   . Gait disturbance   . Anxiety   . GERD (gastroesophageal reflux disease)   . Vitamin B12 deficiency   . Depression   . Osteoarthritis   . Diabetes mellitus, type 2   . Hypertension   . Vitamin D deficiency   . Tremor   . Aortic stenosis   . Hyperlipidemia   . Diverticulosis   . Adenomatous polyp of colon   . Memory loss   . Polyneuropathy in diabetes(357.2)   . Neuropathy    Past Surgical History  Procedure Laterality Date  . Vasectomy    . Lumbar laminectomy      L5 Dr Ophelia Charter  . Transurethral resection  of prostate  March 2007  . Cataract extraction, bilateral Bilateral   . Tonsillectomy and adenoidectomy    . Left foot     Social History:  reports that he has quit smoking. His smoking use included Cigars. He has never used smokeless tobacco. He reports that he does not drink alcohol or use illicit drugs.  Allergies  Allergen Reactions  . Citalopram  Hydrobromide     REACTION: "crazy"  . Duloxetine     REACTION: did not like    Family History: no history of cancers, no cardiovascular diseases on mother or father side  Medication Sig  amLODipine (NORVASC) 10 MG tablet Take 1 tablet (10 mg total) by mouth daily.  aspirin EC 81 MG tablet Take 81 mg by mouth daily.  atenolol (TENORMIN) 25 MG tablet Take 1 tablet (25 mg total) by mouth daily.  clonazePAM (KLONOPIN) 0.5 MG tablet Take 1 tablet (0.5 mg total) by mouth 3 (three) times daily as needed for anxiety. For anxiety.  diclofenac sodium (VOLTAREN) 1 % GEL Apply 2 g topically 2 (two) times daily as needed.  donepezil (ARICEPT) 5 MG tablet Take 1 tablet (5 mg total) by mouth at bedtime.  escitalopram (LEXAPRO) 20 MG tablet Take 1 tablet (20 mg total) by mouth daily.  glipiZIDE (GLUCOTROL XL) 10 MG 24 hr tablet Take 1 tablet (10 mg total) by mouth daily.  metFORMIN (GLUCOPHAGE) 500 MG tablet Take 1,000 mg by mouth daily with breakfast. 1000mg  every morning and 500 mg at night  potassium chloride (K-DUR) 10 MEQ tablet Take 1 tablet (10 mEq total) by mouth daily.  simvastatin (ZOCOR) 40 MG tablet Take 1 tablet (40 mg total) by mouth at bedtime.  tamsulosin (FLOMAX) 0.4 MG CAPS Take 1 capsule (0.4 mg total) by mouth daily.  valsartan-hydrochlorothiazide (DIOVAN-HCT) 320-12.5 MG per tablet Take 1 tablet by mouth daily.  zolpidem (AMBIEN) 10 MG tablet Take 1.5 tablets (15 mg total) by mouth at bedtime as needed for sleep.   Physical Exam: Filed Vitals:   12/18/12 2100 12/18/12 2115 12/18/12 2130 12/18/12 2145  BP: 144/66 142/57 134/63 139/65  Pulse: 50 51 48 48  Temp:      TempSrc:      Resp: 16 17 16 17   SpO2: 97% 94% 96% 95%    Physical Exam  Constitutional: Appears well-developed and well-nourished. No distress.  HENT: Normocephalic. External right and left ear normal. Oropharynx is clear and moist.  Eyes: Conjunctivae and EOM are normal. PERRLA, no scleral icterus.  Neck:  Normal ROM. Neck supple. No JVD. No tracheal deviation. No thyromegaly.  CVS: Regular rhythm, bradycardic, S1/S2 +, no murmurs, no gallops, no carotid bruit.  Pulmonary: Effort and breath sounds normal, no stridor, rhonchi, wheezes, rales.  Abdominal: Soft. BS +,  no distension, tenderness, rebound or guarding.  Musculoskeletal: Normal range of motion. No edema and no tenderness.  Lymphadenopathy: No lymphadenopathy noted, cervical, inguinal. Neuro: Alert. Normal reflexes, muscle tone coordination. No cranial nerve deficit. Skin: Skin is warm and dry. No rash noted. Not diaphoretic. No erythema. No pallor.  Psychiatric: Normal mood and affect. Behavior, judgment, thought content normal.   Labs on Admission:  Basic Metabolic Panel:  Recent Labs Lab 12/18/12 1950  NA 138  K 3.6  CL 102  CO2 24  GLUCOSE 87  BUN 18  CREATININE 0.88  CALCIUM 10.0   Liver Function Tests:  Recent Labs Lab 12/18/12 1950  AST 16  ALT 14  ALKPHOS 66  BILITOT 1.1  PROT 7.4  ALBUMIN 3.6   CBC:  Recent Labs Lab 12/18/12 1950  WBC 5.7  NEUTROABS 3.1  HGB 11.8*  HCT 34.5*  MCV 96.6  PLT 191   Cardiac Enzymes:  Recent Labs Lab 12/18/12 1950  TROPONINI <0.30   Radiological Exams on Admission: Dg Chest 2 View  12/18/2012   *RADIOLOGY REPORT*  Clinical Data: Chest pain.  CHEST - 2 VIEW  Comparison: 09/29/2012  Findings: Heart size and pulmonary vascularity are normal. Calcification in the thoracic aorta.  The lungs are clear.  No effusions.  No acute osseous abnormality.  IMPRESSION: No acute disease.   Original Report Authenticated By: Francene Boyers, M.D.   Ct Head Wo Contrast  12/18/2012   *RADIOLOGY REPORT*  Clinical Data: Right-sided headache  CT HEAD WITHOUT CONTRAST  Technique:  Contiguous axial images were obtained from the base of the skull through the vertex without contrast.  Comparison: CT 03/22/2012  Findings: Moderate atrophy and moderate chronic microvascular ischemic change in  the white matter.  Chronic lacuna in the deep white matter on the right is unchanged.  Negative for acute infarct, hemorrhage, or mass.  Calvarium is intact.  There is atherosclerotic disease in the vertebral and carotid arteries.  IMPRESSION: Atrophy and chronic ischemia.  No acute abnormality.   Original Report Authenticated By: Janeece Riggers, M.D.    EKG: Normal sinus rhythm, bradycardia, no ST/T wave changes  Code Status: Full Family Communication: Pt at bedside Disposition Plan: Admit for further evaluation  Manson Passey, MD  [facility] Pager [phone]  If 7PM-7AM, please contact night-coverage [email or web address] Password [facility] 12/18/2012, 10:55 PM

## 2012-12-19 ENCOUNTER — Encounter (HOSPITAL_COMMUNITY): Payer: Self-pay

## 2012-12-19 DIAGNOSIS — F22 Delusional disorders: Secondary | ICD-10-CM

## 2012-12-19 DIAGNOSIS — F0391 Unspecified dementia with behavioral disturbance: Secondary | ICD-10-CM

## 2012-12-19 LAB — BASIC METABOLIC PANEL
BUN: 17 mg/dL (ref 6–23)
CO2: 29 mEq/L (ref 19–32)
Chloride: 103 mEq/L (ref 96–112)
Creatinine, Ser: 0.78 mg/dL (ref 0.50–1.35)
Glucose, Bld: 120 mg/dL — ABNORMAL HIGH (ref 70–99)

## 2012-12-19 LAB — TROPONIN I
Troponin I: <0.3 ng/mL (ref <0.30)
Troponin I: <0.3 ng/mL (ref <0.30)

## 2012-12-19 LAB — GLUCOSE, CAPILLARY: Glucose-Capillary: 98 mg/dL (ref 70–99)

## 2012-12-19 LAB — VITAMIN B12: Vitamin B-12: 856 pg/mL (ref 211–911)

## 2012-12-19 LAB — TSH: TSH: 0.947 u[IU]/mL (ref 0.350–4.500)

## 2012-12-19 MED ORDER — ATORVASTATIN CALCIUM 20 MG PO TABS
20.0000 mg | ORAL_TABLET | Freq: Every day | ORAL | Status: DC
  Administered 2012-12-19: 20 mg via ORAL
  Filled 2012-12-19 (×2): qty 1

## 2012-12-19 MED ORDER — POTASSIUM CHLORIDE CRYS ER 20 MEQ PO TBCR
40.0000 meq | EXTENDED_RELEASE_TABLET | Freq: Two times a day (BID) | ORAL | Status: AC
  Administered 2012-12-19 (×2): 40 meq via ORAL
  Filled 2012-12-19 (×2): qty 2

## 2012-12-19 MED ORDER — RISPERIDONE 0.25 MG PO TABS
0.2500 mg | ORAL_TABLET | Freq: Two times a day (BID) | ORAL | Status: DC
  Administered 2012-12-19 – 2012-12-20 (×2): 0.25 mg via ORAL
  Filled 2012-12-19 (×3): qty 1

## 2012-12-19 MED ORDER — ATENOLOL 25 MG PO TABS
25.0000 mg | ORAL_TABLET | Freq: Every day | ORAL | Status: DC
  Administered 2012-12-20: 25 mg via ORAL
  Filled 2012-12-19 (×2): qty 1

## 2012-12-19 MED ORDER — ZOLPIDEM TARTRATE 5 MG PO TABS
5.0000 mg | ORAL_TABLET | Freq: Every evening | ORAL | Status: DC | PRN
  Administered 2012-12-19: 5 mg via ORAL
  Filled 2012-12-19: qty 1

## 2012-12-19 MED ORDER — DIPHENHYDRAMINE HCL 25 MG PO CAPS: 25.0000 mg | ORAL_CAPSULE | Freq: Three times a day (TID) | ORAL | Status: DC | PRN

## 2012-12-19 MED ORDER — METFORMIN HCL 500 MG PO TABS
500.0000 mg | ORAL_TABLET | Freq: Every day | ORAL | Status: DC
  Filled 2012-12-19: qty 1

## 2012-12-19 MED ORDER — POTASSIUM CHLORIDE 10 MEQ/100ML IV SOLN
10.0000 meq | INTRAVENOUS | Status: AC
  Administered 2012-12-19 (×3): 10 meq via INTRAVENOUS
  Filled 2012-12-19 (×3): qty 100

## 2012-12-19 MED ORDER — OCUVITE-LUTEIN PO CAPS
1.0000 | ORAL_CAPSULE | Freq: Every day | ORAL | Status: DC
  Administered 2012-12-19 – 2012-12-20 (×2): 1 via ORAL
  Filled 2012-12-19 (×2): qty 1

## 2012-12-19 MED ORDER — ACETAMINOPHEN 325 MG PO TABS: 650.0000 mg | ORAL_TABLET | ORAL | Status: DC | PRN

## 2012-12-19 NOTE — Progress Notes (Signed)
Nutrition Brief Note  Patient identified on the Malnutrition Screening Tool (MST) Report  Body mass index is 23.09 kg/(m^2). Patient meets criteria for Normal Weight based on current BMI. Per weight history pt has maintained weight around 135 lbs for the past year. Pt states that a few years ago his appetite decreased and he has lost 50 lbs gradually over the past few years.  Current diet order is Carb Modified, patient is consuming approximately 75-100% of meals at this time. Pt reports that he usually eats 3 meals daily and that he is eating as much as he normally does. Pt states that he avoids sweets and bread at home and he eats protein-rich foods daily. Encourage pt to continue po intake >75% of 3 meals daily. Pt hopes to go home soon and is not interested in receiving any nutritional supplements or snacks at this time. Labs and medications reviewed.   No further nutrition interventions warranted at this time. If nutrition issues arise, please consult RD.   Ian Malkin RD, LDN Inpatient Clinical Dietitian Pager: [phone] After Hours Pager: [phone]

## 2012-12-19 NOTE — Progress Notes (Signed)
Atenolol ordered as per home, but pt's wife states that she only gives it to him if his BP is elevated.  Pt's HR is in the 50's, down to 40 when asleep.  Atenolol not given last night.  Berton Bon, RN-BC

## 2012-12-19 NOTE — Evaluation (Signed)
Physical Therapy Evaluation Patient Details Name: [name] MRN: [id number] DOB: 1924/12/01 Today's Date: 12/19/2012 Time: 0955-1010 PT Time Calculation (min): 15 min  PT Assessment / Plan / Recommendation Clinical Impression  Pt is an 79 year old male admitted for chest pain.  Pt denies any chest pain today and able to ambulate in hallway short distance.  Pt would benefit from acute PT services in order to improve independence with transfers, ambulation, and stairs to prepare for d/c.  Recommend 24/7 assist if d/c home otherwise ST-SNF.    PT Assessment  Patient needs continued PT services    Follow Up Recommendations  Home health PT;Supervision/Assistance - 24 hour    Does the patient have the potential to tolerate intense rehabilitation      Barriers to Discharge        Equipment Recommendations  None recommended by PT    Recommendations for Other Services     Frequency Min 3X/week    Precautions / Restrictions Precautions Precautions: Fall   Pertinent Vitals/Pain Pt reports generalized pain (not rated) however no chest pain and agreeable to ambulate. Repositioned to comfort in recliner.      Mobility  Bed Mobility Bed Mobility: Supine to Sit Supine to Sit: 4: Min assist;HOB elevated Details for Bed Mobility Assistance: assist for trunk, verbal cues for technique Transfers Transfers: Sit to Stand;Stand to Sit Sit to Stand: 4: Min guard;With upper extremity assist;From bed;From elevated surface Stand to Sit: 4: Min guard;With upper extremity assist;To chair/3-in-1 Details for Transfer Assistance: verbal cues for safe technique Ambulation/Gait Ambulation/Gait Assistance: 4: Min guard Ambulation Distance (Feet): 60 Feet Assistive device: Rolling walker Ambulation/Gait Assistance Details: pt reports using RW due to L foot pain (since 2007) stating no pain if he can "take weight through his L arm", pt also reported dizziness however did not get worse Gait  Pattern: Step-to pattern;Decreased step length - left Gait velocity: decreased General Gait Details: advances L LE first    Exercises     PT Diagnosis: Difficulty walking;Generalized weakness  PT Problem List: Decreased strength;Decreased activity tolerance;Decreased mobility;Decreased knowledge of use of DME PT Treatment Interventions: DME instruction;Therapeutic activities;Therapeutic exercise;Patient/family education;Functional mobility training;Stair training;Gait training   PT Goals Acute Rehab PT Goals PT Goal Formulation: With patient Time For Goal Achievement: 12/26/12 Potential to Achieve Goals: Good Pt will go Supine/Side to Sit: with supervision PT Goal: Supine/Side to Sit - Progress: Goal set today Pt will go Sit to Stand: with supervision PT Goal: Sit to Stand - Progress: Goal set today Pt will go Stand to Sit: with supervision PT Goal: Stand to Sit - Progress: Goal set today Pt will Ambulate: 51 - 150 feet;with supervision;with rolling walker PT Goal: Ambulate - Progress: Goal set today Pt will Go Up / Down Stairs: 3-5 stairs;with min assist;with least restrictive assistive device PT Goal: Up/Down Stairs - Progress: Goal set today Pt will Perform Home Exercise Program: with supervision, verbal cues required/provided PT Goal: Perform Home Exercise Program - Progress: Goal set today  Visit Information  Last PT Received On: 12/19/12 Assistance Needed: +1    Subjective Data  Subjective: I've been using a walker or cane since 2007.     Prior Functioning  Home Living Lives With: Family Type of Home: House Home Access: Stairs to enter [employer] of Steps: "a couple" Home Layout: One level Home Adaptive Equipment: Environmental consultant - four wheeled;Walker - rolling;Straight cane Prior Function Level of Independence: Independent with assistive device(s) Communication Communication: No difficulties  Cognition  Cognition Arousal/Alertness: Awake/alert Behavior  During Therapy: WFL for tasks assessed/performed Overall Cognitive Status: Within Functional Limits for tasks assessed    Extremity/Trunk Assessment Right Lower Extremity Assessment RLE ROM/Strength/Tone: Deficits RLE ROM/Strength/Tone Deficits: generalized weakness observed with mobility RLE Sensation: History of peripheral neuropathy Left Lower Extremity Assessment LLE ROM/Strength/Tone: Deficits LLE ROM/Strength/Tone Deficits: generalized weakness observed with mobility, decreased L ankle ROM (pt reports L ankle pain since 2007) LLE Sensation: History of peripheral neuropathy   Balance    End of Session PT - End of Session Activity Tolerance: Patient tolerated treatment well Patient left: in chair;with call bell/phone within reach;with family/visitor present  GP     Gumecindo Hopkin,KATHrine E 12/19/2012, 10:31 AM Zenovia Jarred, PT, DPT 12/19/2012 Pager: [phone]

## 2012-12-19 NOTE — Progress Notes (Signed)
[facility] PROGRESS NOTE  [name] ZOX:[id number] DOB: [birth year] DOA: 12/18/2012 PCP: Sonda Primes, MD  Brief narrative: [name] is an 79 y.o. male with a PMH of hypertension, hyperlipidemia, type 2 diabetes who was admitted on 12/18/2012 with chest pain.  His wife also reported significant paranoia with his: The police on her, accusing her of poisoning him, and accusing her of stealing all of their money after she moved money from account to prevent him from investing it in risky investments.  Assessment/Plan: Principal Problem:   Chest pain -Admitted to telemetry to rule out acute coronary syndrome given significant risk factors. -Troponins negative x2. D-dimer not elevated. Followup TSH. Check fasting lipid panel for further risk factor stratification. -Continue aspirin. Active Problems:   UTI -Urinalysis with nitrites and leukocytes. Followup culture. On empiric Rocephin.   Hypokalemia -Replete. Magnesium within normal limits.   HYPERLIPIDEMIA -Continue statin.   DEPRESSION -Continue Klonopin and Lexapro.   HYPERTENSION -Continue Norvasc, atenolol, and Diovan/HCT.   Dementia with behavioral disturbance, paranoia -Continue Aricept. -Will likely benefit from antipsychotic medications and a competency assessment. -Psychiatry consultation requested. -Followup TSH. Check RPR and B12 levels.   Diabetes mellitus, type 2 -Continue Glucotrol and Glucophage. Continue SSI. -Followup hemoglobin A1c.  Code Status: Full. Family Communication: Wife and patient at bedside. Disposition Plan: Home when stable.   Medical Consultants:  Psychiatry  Other Consultants:  None.  Anti-infectives:  Rocephin 12/18/12--->  HPI/Subjective: [name] is angry and anxious. He tells me that he knows why he is anxious and is resistant to the idea of a psychiatrist evaluating him. No current chest pain. No nausea or vomiting. No dyspnea. After I left the room, the  patient's wife followed me out and confided in me that he has been angry and: The police on her, accusing her of stealing his money. She told me that he has been in testing and risky investments and that she moved the money out of a joint account to prevent him from continuing to do this. He became very angry. He accuses her of stealing his money and of trying to poison him.  Objective: Filed Vitals:   12/18/12 2300 12/18/12 2310 12/18/12 2356 12/19/12 0452  BP: 153/54  143/58 155/64  Pulse: 50  51 52  Temp:   98.5 F (36.9 C) 98.4 F (36.9 C)  TempSrc:   Oral Oral  Resp: 18  14 16   Height:  5' 2.99" (1.6 m)    Weight:  59.9 kg (132 lb 0.9 oz) 59.1 kg (130 lb 4.7 oz)   SpO2: 97%  99% 98%    Intake/Output Summary (Last 24 hours) at 12/19/12 0739 Last data filed at 12/19/12 8119  Gross per 24 hour  Intake    710 ml  Output    275 ml  Net    435 ml    Exam: Gen:  NAD, anxious Cardiovascular:  Bradycardic, No M/R/G Respiratory:  Lungs CTAB Gastrointestinal:  Abdomen soft, NT/ND, + BS Extremities:  No C/E/C  Data Reviewed: Basic Metabolic Panel:  Recent Labs Lab 12/18/12 1950 12/19/12 0200  NA 138 137  K 3.6 2.9*  CL 102 103  CO2 24 29  GLUCOSE 87 120*  BUN 18 17  CREATININE 0.88 0.78  CALCIUM 10.0 9.1  MG  --  1.9   GFR Estimated Creatinine Clearance: 53.3 ml/min (by C-G formula based on Cr of 0.78). Liver Function Tests:  Recent Labs Lab 12/18/12 1950  AST 16  ALT  14  ALKPHOS 66  BILITOT 1.1  PROT 7.4  ALBUMIN 3.6   CBC:  Recent Labs Lab 12/18/12 1950  WBC 5.7  NEUTROABS 3.1  HGB 11.8*  HCT 34.5*  MCV 96.6  PLT 191   Cardiac Enzymes:  Recent Labs Lab 12/18/12 1950 12/19/12 0200  TROPONINI <0.30 <0.30   BNP (last 3 results)  Recent Labs  12/18/12 1950  PROBNP 229.4   CBG:  Recent Labs Lab 12/18/12 2354 12/19/12 0028  GLUCAP 57* 96   D-Dimer  Recent Labs  12/18/12 1950  DDIMER 0.32   Lipid Profile No results found  for this basename: CHOL, HDL, LDLCALC, TRIG, CHOLHDL, LDLDIRECT,  in the last 72 hours Thyroid function studies No results found for this basename: TSH, T4TOTAL, FREET3, T3FREE, THYROIDAB,  in the last 72 hours Microbiology No results found for this or any previous visit (from the past 240 hour(s)).   Procedures and Diagnostic Studies: Dg Chest 2 View  12/18/2012   *RADIOLOGY REPORT*  Clinical Data: Chest pain.  CHEST - 2 VIEW  Comparison: 09/29/2012  Findings: Heart size and pulmonary vascularity are normal. Calcification in the thoracic aorta.  The lungs are clear.  No effusions.  No acute osseous abnormality.  IMPRESSION: No acute disease.   Original Report Authenticated By: Francene Boyers, M.D.   Ct Head Wo Contrast  12/18/2012   *RADIOLOGY REPORT*  Clinical Data: Right-sided headache  CT HEAD WITHOUT CONTRAST  Technique:  Contiguous axial images were obtained from the base of the skull through the vertex without contrast.  Comparison: CT 03/22/2012  Findings: Moderate atrophy and moderate chronic microvascular ischemic change in the white matter.  Chronic lacuna in the deep white matter on the right is unchanged.  Negative for acute infarct, hemorrhage, or mass.  Calvarium is intact.  There is atherosclerotic disease in the vertebral and carotid arteries.  IMPRESSION: Atrophy and chronic ischemia.  No acute abnormality.   Original Report Authenticated By: Janeece Riggers, M.D.    Scheduled Meds: . amLODipine  10 mg Oral Daily  . aspirin EC  81 mg Oral Daily  . atenolol  25 mg Oral Daily  . atorvastatin  20 mg Oral q1800  . cefTRIAXone (ROCEPHIN)  IV  1 g Intravenous Q24H  . donepezil  5 mg Oral QHS  . enoxaparin (LOVENOX) injection  40 mg Subcutaneous QHS  . escitalopram  20 mg Oral Daily  . glipiZIDE  10 mg Oral QAC breakfast  . irbesartan  300 mg Oral Daily   And  . hydrochlorothiazide  12.5 mg Oral Daily  . insulin aspart  0-9 Units Subcutaneous TID WC  . metFORMIN  1,000 mg Oral Q  breakfast  . metFORMIN  500 mg Oral QHS  . potassium chloride  10 mEq Oral Daily  . sodium chloride  3 mL Intravenous Q12H  . tamsulosin  0.4 mg Oral Daily   Continuous Infusions:   Time spent: 35 minutes with greater than 50% of the time spent ascertaining the history of present illness from the patient and separately from the wife, discussing the current test results, impression, and plan of care.   LOS: 1 day   RAMA,CHRISTINA  [facility] Pager [phone].   *Please note that the hospitalists switch teams on Wednesdays. Please call the flow manager at [phone] if you are having difficulty reaching the hospitalist taking care of this patient as she can update you and provide the most up-to-date pager number of provider caring for the patient.  If 8PM-8AM, please contact night-coverage at [email or web address], password [facility]  12/19/2012, 7:39 AM
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
## 2012-12-19 NOTE — Progress Notes (Signed)
CRITICAL VALUE ALERT  Critical value received:  2.9  Date of notification:  12/19/12  Time of notification:  0355  Critical value read back:yes  Nurse who received alert:  Berton Bon RN  MD notified (1st page):  Virgia Land, NP  Time of first page:  0400  MD notified (2nd page):  Time of second page:  Responding MD:  Virgia Land NP  Time MD responded:  [phone]

## 2012-12-20 LAB — BASIC METABOLIC PANEL
BUN: 15 mg/dL (ref 6–23)
Creatinine, Ser: 0.95 mg/dL (ref 0.50–1.35)
GFR calc Af Amer: 85 mL/min — ABNORMAL LOW (ref >90)
GFR calc non Af Amer: 73 mL/min — ABNORMAL LOW (ref >90)
Potassium: 4.4 mEq/L (ref 3.5–5.1)

## 2012-12-20 LAB — LIPID PANEL
Cholesterol: 87 mg/dL (ref 0–200)
VLDL: 11 mg/dL (ref 0–40)

## 2012-12-20 LAB — URINE CULTURE
Colony Count: NO GROWTH
Culture: NO GROWTH

## 2012-12-20 MED ORDER — DONEPEZIL HCL 5 MG PO TABS: 5.0000 mg | ORAL_TABLET | Freq: Every day | ORAL | Status: DC

## 2012-12-20 MED ORDER — RISPERIDONE 0.25 MG PO TABS: 0.2500 mg | ORAL_TABLET | Freq: Two times a day (BID) | ORAL | Status: DC

## 2012-12-20 MED ORDER — DIPHENHYDRAMINE HCL 25 MG PO CAPS: 25.0000 mg | ORAL_CAPSULE | Freq: Three times a day (TID) | ORAL | Status: DC | PRN

## 2012-12-20 MED ORDER — AMLODIPINE BESYLATE 10 MG PO TABS: 10.0000 mg | ORAL_TABLET | Freq: Every day | ORAL | Status: DC

## 2012-12-20 MED ORDER — ZOLPIDEM TARTRATE 10 MG PO TABS: 5.0000 mg | ORAL_TABLET | Freq: Every evening | ORAL | Status: DC | PRN

## 2012-12-20 NOTE — Discharge Summary (Addendum)
Physician Discharge Summary  [name] UUV:[id number] DOB: 1924-07-07 DOA: 12/18/2012  PCP: Sonda Primes, MD  Admit date: 12/18/2012 Discharge date: 12/20/2012  Recommendations for Outpatient Follow-up:  1. F/U with PCP in one week for repeat blood pressure check and to assess effectiveness of Risperdal in  controlling behavioral agitation. 2. FYI - Patient is on amlodipine and simvastatin >20mg /day have reported cases of rhabdomyolysis. Simvastatin 40mg  was changed to lipitor 20mg  to avoid this drug interaction while the patient was hospitalized. Recommend consideration of changing his simvastatin to Lipitor in the outpatient setting.   Discharge Diagnoses:  Principal Problem:    Chest pain Active Problems:    HYPERLIPIDEMIA    DEPRESSION    HYPERTENSION    Dementia with behavioral disturbance    Diabetes mellitus, type 2   Discharge Condition: Improved.  Diet recommendation: Low-sodium, heart healthy, carbohydrate modified.  History of present illness:  [name] is an 79 y.o. male with a PMH of hypertension, hyperlipidemia, type 2 diabetes who was admitted on 12/18/2012 with chest pain. His wife also reported significant paranoia with his calling the police on her, accusing her of poisoning him, and accusing her of stealing all of their money after she moved money from account to prevent him from investing it in risky investments.  Hospital Course by problem:  Principal Problem:  Chest pain  -Admitted to telemetry to rule out acute coronary syndrome given significant risk factors.  -Troponins negative x4. D-dimer not elevated. TSH WNL. Fasting lipid panel shows excellent cholesterol control.  -Continue aspirin.  Active Problems:  UTI  -Urinalysis with nitrites and leukocytes. Urine culture negaive. D/C Rocephin.  Hypokalemia  -Repleted. Magnesium was within normal limits.  HYPERLIPIDEMIA  -Continue statin. Has excellent cholesterol control.  DEPRESSION   -Continue Klonopin and Lexapro.  HYPERTENSION  -Continue Norvasc and Diovan/HCT. Atenolol was placed on hold secondary to bradycardia. Dementia with behavioral disturbance, paranoia  -Continue Aricept.  -Psychiatry consultation performed 12/19/12, Risperdal started. -TSH, RPR and B12 levels WNL.  Diabetes mellitus, type 2  -Continue Glucotrol and Glucophage.  -Hemoglobin A1c 5.6%.  Procedures:  None.  Consultations:  Dr. Elsie Saas, Psychiatry.  Discharge Exam: Filed Vitals:   12/20/12 0436  BP: 134/50  Pulse: 47  Temp: 98.2 F (36.8 C)  Resp: 19   Filed Vitals:   12/19/12 0452 12/19/12 1334 12/19/12 2044 12/20/12 0436  BP: 155/64 161/57 133/57 134/50  Pulse: 52 50 45 47  Temp: 98.4 F (36.9 C) 97.9 F (36.6 C) 98.2 F (36.8 C) 98.2 F (36.8 C)  TempSrc: Oral Oral Oral Oral  Resp: 16 16 18 19   Height:      Weight:      SpO2: 98% 98% 97% 99%    Gen:  NAD, not as paranoid today. Cardiovascular:  RRR, No M/R/G Respiratory: Lungs CTAB Gastrointestinal: Abdomen soft, NT/ND with normal active bowel sounds. Extremities: No C/E/C   Discharge Instructions      Discharge Orders   Future Appointments Provider Department Dept Phone   03/07/2013 12:00 PM York Spaniel, MD [facility] [phone]   03/10/2013 3:00 PM Tresa Garter, MD [facility] [phone]   Future Orders Complete By Expires     Call MD for:  severe uncontrolled pain  As directed     Call MD for:  As directed     Scheduling Instructions:      Agitation, mental status changes, feeling paranoid.    Diet - low sodium heart  healthy  As directed     Diet Carb Modified  As directed     Discharge instructions  As directed     Comments:      Note, several of your blood pressure medications have been changed or placed on hold.  Note: A dose of greater than 5 mg of Ambien is not recommended in persons greater than the age of 37.  You were cared  for by Dr. Hillery Aldo  (a hospitalist) during your hospital stay. If you have any questions about your discharge medications or the care you received while you were in the hospital after you are discharged, you can call the unit and ask to speak with the hospitalist on call if the hospitalist that took care of you is not available. Once you are discharged, your primary care physician will handle any further medical issues. Please note that NO REFILLS for any discharge medications will be authorized once you are discharged, as it is imperative that you return to your primary care physician (or establish a relationship with a primary care physician if you do not have one) for your aftercare needs so that they can reassess your need for medications and monitor your lab values.  Any outstanding tests can be reviewed by your PCP at your follow up visit.    Increase activity slowly  As directed         Medication List    STOP taking these medications       atenolol 25 MG tablet  Commonly known as:  TENORMIN      TAKE these medications       amLODipine 10 MG tablet  Commonly known as:  NORVASC  Take 1 tablet (10 mg total) by mouth daily.     aspirin EC 81 MG tablet  Take 81 mg by mouth daily.     Cholecalciferol 1000 UNITS capsule  Take 1,000 Units by mouth daily.     clonazePAM 0.5 MG tablet  Commonly known as:  KLONOPIN  Take 1 tablet (0.5 mg total) by mouth 3 (three) times daily as needed for anxiety. For anxiety.     cyanocobalamin 1000 MCG/ML injection  Commonly known as:  (VITAMIN B-12)  Inject 1 mL (1,000 mcg total) into the muscle every 14 (fourteen) days.     diclofenac sodium 1 % Gel  Commonly known as:  VOLTAREN  Apply 2 g topically 2 (two) times daily as needed.     diphenhydrAMINE 25 mg capsule  Commonly known as:  BENADRYL  Take 1 capsule (25 mg total) by mouth every 8 (eight) hours as needed for allergies (Muscle spasms or restlessness).     donepezil 5 MG tablet   Commonly known as:  ARICEPT  Take 1 tablet (5 mg total) by mouth at bedtime.     escitalopram 20 MG tablet  Commonly known as:  LEXAPRO  Take 1 tablet (20 mg total) by mouth daily.     glipiZIDE 10 MG 24 hr tablet  Commonly known as:  GLUCOTROL XL  Take 1 tablet (10 mg total) by mouth daily.     ICAPS PO  Take 2 capsules by mouth daily.     metFORMIN 500 MG tablet  Commonly known as:  GLUCOPHAGE  Take 1,000 mg by mouth daily with breakfast. 1000mg  every morning and 500 mg at night     potassium chloride 10 MEQ tablet  Commonly known as:  K-DUR  Take 1 tablet (10 mEq total) by mouth  daily.     risperiDONE 0.25 MG tablet  Commonly known as:  RISPERDAL  Take 1 tablet (0.25 mg total) by mouth 2 (two) times daily.     simvastatin 40 MG tablet  Commonly known as:  ZOCOR  Take 1 tablet (40 mg total) by mouth at bedtime.     tamsulosin 0.4 MG Caps  Commonly known as:  FLOMAX  Take 1 capsule (0.4 mg total) by mouth daily.     valsartan-hydrochlorothiazide 320-12.5 MG per tablet  Commonly known as:  DIOVAN-HCT  Take 1 tablet by mouth daily.     VITAMIN B-6 PO  Take 1 tablet by mouth daily.     vitamin C 500 MG tablet  Commonly known as:  ASCORBIC ACID  Take 500 mg by mouth daily.     zolpidem 10 MG tablet  Commonly known as:  AMBIEN  Take 0.5 tablets (5 mg total) by mouth at bedtime as needed for sleep.       Follow-up Information   Follow up with Sonda Primes, MD. Schedule an appointment as soon as possible for a visit in 1 week.   Contact information:   [address] [place] [address] [phone]        The results of significant diagnostics from this hospitalization (including imaging, microbiology, ancillary and laboratory) are listed below for reference.    Significant Diagnostic Studies: Dg Chest 2 View  12/18/2012   *RADIOLOGY REPORT*  Clinical Data: Chest pain.  CHEST - 2 VIEW  Comparison: 09/29/2012  Findings: Heart  size and pulmonary vascularity are normal. Calcification in the thoracic aorta.  The lungs are clear.  No effusions.  No acute osseous abnormality.  IMPRESSION: No acute disease.   Original Report Authenticated By: Francene Boyers, M.D.   Ct Head Wo Contrast  12/18/2012   *RADIOLOGY REPORT*  Clinical Data: Right-sided headache  CT HEAD WITHOUT CONTRAST  Technique:  Contiguous axial images were obtained from the base of the skull through the vertex without contrast.  Comparison: CT 03/22/2012  Findings: Moderate atrophy and moderate chronic microvascular ischemic change in the white matter.  Chronic lacuna in the deep white matter on the right is unchanged.  Negative for acute infarct, hemorrhage, or mass.  Calvarium is intact.  There is atherosclerotic disease in the vertebral and carotid arteries.  IMPRESSION: Atrophy and chronic ischemia.  No acute abnormality.   Original Report Authenticated By: Janeece Riggers, M.D.    Labs:  Basic Metabolic Panel:  Recent Labs Lab 12/18/12 1950 12/19/12 0200 12/20/12 0410  NA 138 137 140  K 3.6 2.9* 4.4  CL 102 103 107  CO2 24 29 28   GLUCOSE 87 120* 88  BUN 18 17 15   CREATININE 0.88 0.78 0.95  CALCIUM 10.0 9.1 9.0  MG  --  1.9  --    GFR Estimated Creatinine Clearance: 44.9 ml/min (by C-G formula based on Cr of 0.95). Liver Function Tests:  Recent Labs Lab 12/18/12 1950  AST 16  ALT 14  ALKPHOS 66  BILITOT 1.1  PROT 7.4  ALBUMIN 3.6   CBC:  Recent Labs Lab 12/18/12 1950  WBC 5.7  NEUTROABS 3.1  HGB 11.8*  HCT 34.5*  MCV 96.6  PLT 191   Cardiac Enzymes:  Recent Labs Lab 12/18/12 1950 12/19/12 0200 12/19/12 0859 12/19/12 1421  TROPONINI <0.30 <0.30 <0.30 <0.30   CBG:  Recent Labs Lab 12/19/12 0736 12/19/12 1229 12/19/12 1729 12/20/12 0737 12/20/12  1143  GLUCAP 76 98 143* 89 174*   D-Dimer  Recent Labs  12/18/12 1950  DDIMER 0.32   Hgb A1c  Recent Labs  12/19/12 0200  HGBA1C 5.6   Lipid  Profile  Recent Labs  12/20/12 0410  CHOL 87  HDL 46  LDLCALC 30  TRIG 55  CHOLHDL 1.9   Thyroid function studies  Recent Labs  12/19/12 0200  TSH 0.947   Anemia work up  Recent Labs  12/19/12 0859  VITAMINB12 856   Microbiology Recent Results (from the past 240 hour(s))  URINE CULTURE     Status: None   Collection Time    12/18/12  9:01 PM      Result Value Range Status   Specimen Description URINE, CLEAN CATCH   Final   Special Requests NONE   Final   Culture  Setup Time 12/19/2012 02:25   Final   Colony Count NO GROWTH   Final   Culture NO GROWTH   Final   Report Status 12/20/2012 FINAL   Final    Time coordinating discharge: 35 minutes.  Signed:  Ortha Metts  Pager [phone] [facility] 12/20/2012, 12:44 PM

## 2012-12-20 NOTE — Progress Notes (Signed)
Physical Therapy Treatment Patient Details Name: MEHAR KIRKWOOD MRN: [id number] DOB: [birth year] Today's Date: 12/20/2012 Time: 7846-9629 PT Time Calculation (min): 32 min  PT Assessment / Plan / Recommendation Comments on Treatment Session  Spouse and pt remembered me from prior recent admission.  Nice.  Assisted pt OOB to amb in hallway.  Good safety cognition.  Slow but steady.  Pt plans to d/c to home with spouse.    Follow Up Recommendations  Home health PT;Supervision/Assistance - 24 hour     Does the patient have the potential to tolerate intense rehabilitation     Barriers to Discharge        Equipment Recommendations  None recommended by PT    Recommendations for Other Services    Frequency Min 3X/week   Plan      Precautions / Restrictions Precautions Precautions: Fall   Pertinent Vitals/Pain No c/o pain    Mobility  Bed Mobility Bed Mobility: Supine to Sit Supine to Sit: 5: Supervision Details for Bed Mobility Assistance: increased time Transfers Transfers: Sit to Stand;Stand to Sit Sit to Stand: 5: Supervision;4: Min guard;From bed Stand to Sit: 5: Supervision;4: Min guard Details for Transfer Assistance: verbal cues for safe technique and increased time Ambulation/Gait Ambulation/Gait Assistance: 5: Supervision;4: Min guard Ambulation Distance (Feet): 150 Feet (75 feet x 2) Assistive device: Rolling walker Ambulation/Gait Assistance Details: increased amb distance and tolerance.  Good safety gonition.  Slow but steady. Gait Pattern: Step-to pattern;Decreased step length - left Gait velocity: decreased    PT Goals                                                    progressing    Visit Information  Last PT Received On: 12/20/12    Subjective Data      Cognition       Balance   good  End of Session PT - End of Session Equipment Utilized During Treatment: Gait belt Activity Tolerance: Patient tolerated treatment well Patient left: in  chair;with call bell/phone within reach;with family/visitor present   Felecia Shelling  PTA [facility]  Acute  Rehab Pager      [phone]

## 2012-12-20 NOTE — Progress Notes (Signed)
Clinical Social Work Department CLINICAL SOCIAL WORK PSYCHIATRY SERVICE LINE ASSESSMENT 12/20/2012  Patient:  [name] [facility]  Account:  [id number]  Admit Date:  12/18/2012  Clinical Social Worker:  Unk Lightning, LCSW  Date/Time:  12/20/2012 12:00 N Referred by:  Physician  Date referred:  12/20/2012 Reason for Referral  Psychosocial assessment   Presenting Symptoms/Problems (In the person's/family's own words):   Psych consulted due to patient being anxious and paranoid.   Abuse/Neglect/Trauma History (check all that apply)  Denies history   Abuse/Neglect/Trauma Comments:   None reported   Psychiatric History (check all that apply)  Outpatient treatment   Psychiatric medications:  Lexapro 20 mg  Risperdal 0.25 mg   Current Mental Health Hospitalizations/Previous Mental Health History:   Patient saw a psychiatrist about 2-3 visits at [facility] but then refused to return. No further treatment and refuses any follow up treatment.   Current provider:   None   Place and Date:   N/A   Current Medications:   acetaminophen, clonazePAM, diclofenac sodium, diphenhydrAMINE, HYDROcodone-acetaminophen, morphine injection, ondansetron (ZOFRAN) IV, ondansetron, zolpidem            . amLODipine  10 mg Oral Daily  . aspirin EC  81 mg Oral Daily  . atenolol  25 mg Oral Daily  . atorvastatin  20 mg Oral q1800  . donepezil  5 mg Oral QHS  . enoxaparin (LOVENOX) injection  40 mg Subcutaneous QHS  . escitalopram  20 mg Oral Daily  . glipiZIDE  10 mg Oral QAC breakfast  . irbesartan  300 mg Oral Daily   And     . hydrochlorothiazide  12.5 mg Oral Daily  . insulin aspart  0-9 Units Subcutaneous TID WC  . metFORMIN  1,000 mg Oral Q breakfast  . metFORMIN  500 mg Oral Q supper  . multivitamin-lutein  1 capsule Oral Daily  . risperiDONE  0.25 mg Oral BID  . sodium chloride  3 mL Intravenous Q12H  . tamsulosin  0.4 mg Oral Daily   Previous Impatient Admission/Date/Reason:   None  reported   Emotional Health / Current Symptoms    Suicide/Self Harm  None reported   Suicide attempt in the past:   Patient denies SI or HI. Patient has had no previous suicide attempts.   Other harmful behavior:   None reported   Psychotic/Dissociative Symptoms  Paranoia   Other Psychotic/Dissociative Symptoms:   Wife reports that patient is paranoid at times. Wife reports that patient becomes anxious and paranoid in the morning and at night.    Attention/Behavioral Symptoms  Withdrawn   Other Attention / Behavioral Symptoms:   Patient drowsy and has limited engagement.    Cognitive Impairment  Orientation - Self  Orientation - Situation   Other Cognitive Impairment:   Wife reports patient has been diagnosed with dementia.    Mood and Adjustment  Lethargic    Stress, Anxiety, Trauma, Any Recent Loss/Stressor  None reported   Anxiety (frequency):   N/A   Phobia (specify):   N/A   Compulsive behavior (specify):   N/A   Obsessive behavior (specify):   N/A   Other:   N/A   Substance Abuse/Use  None   SBIRT completed (please refer for detailed history):  N  Self-reported substance use:   Patient denies substance use.   Urinary Drug Screen Completed:  N Alcohol level:   N/A    Environmental/Housing/Living Arrangement  Stable housing   Who is in the home:  Wife   Emergency contact:  [employer]   Patient's Strengths and Goals (patient's own words):   Patient has supportive wife.   Clinical Social Worker's Interpretive Summary:   CSW received referral to complete psychosocial assessment. CSW reviewed chart and met with patient and wife at bedside. CSW introduced myself and explained role.    Patient and wife have been married for over 40 years and patient has three children and several grandchildren. Patient's family lives all over the [place] and wife is only family member that lives in town. Patient lives with  wife who assists with care.    Wife reports that patient does well at home during the day but becomes anxious and agitated in the mornings and the evenings. Wife reports that patient is paranoid at times but feels this is related to his dementia. Wife reports that patient was never told that he has dementia.    Wife plans on taking husband home and DC and reports that they will DC today. CSW called psych MD who confirmed that medications have been adjusted and can DC home with wife. CSW is signing off but available if further needs arise.   Disposition:  Psych Clinical Social Worker signing off

## 2013-01-16 ENCOUNTER — Telehealth: Payer: Self-pay | Admitting: *Deleted

## 2013-01-16 NOTE — Telephone Encounter (Signed)
If he takes 1/2 tab he should get #45 with 1 ref Thx

## 2013-01-16 NOTE — Telephone Encounter (Signed)
Rec Rf req for Zolpidem 10 mg. Last sig written states he takes 1/2 po qhs prn. # 135 with 1 refill was given by Hillery Aldo, MD on 12/20/12(Rx states "No print"). Please advise ok to Rf

## 2013-01-17 MED ORDER — ZOLPIDEM TARTRATE 10 MG PO TABS: 5.0000 mg | ORAL_TABLET | Freq: Every evening | ORAL | Status: DC | PRN

## 2013-01-17 NOTE — Telephone Encounter (Signed)
Done

## 2013-02-13 ENCOUNTER — Ambulatory Visit: Payer: Self-pay | Admitting: Neurology

## 2013-03-07 ENCOUNTER — Ambulatory Visit (INDEPENDENT_AMBULATORY_CARE_PROVIDER_SITE_OTHER): Payer: Medicare Other | Admitting: Neurology

## 2013-03-07 ENCOUNTER — Encounter: Payer: Self-pay | Admitting: Neurology

## 2013-03-07 VITALS — BP 124/68 | HR 64

## 2013-03-07 DIAGNOSIS — R269 Unspecified abnormalities of gait and mobility: Secondary | ICD-10-CM

## 2013-03-07 DIAGNOSIS — F0390 Unspecified dementia without behavioral disturbance: Secondary | ICD-10-CM

## 2013-03-07 DIAGNOSIS — R413 Other amnesia: Secondary | ICD-10-CM | POA: Insufficient documentation

## 2013-03-07 NOTE — Patient Instructions (Signed)
Go back on the Lexapro 20 mg tablet by taking 1/2 tablet daily for 3 weeks, then take one full tablet daily.

## 2013-03-07 NOTE — Progress Notes (Signed)
Reason for visit: Memory disorder  [name] is an 79 y.o. male  History of present illness:  [name] is an 79 year old right-handed white male with a history of a progressive dementing illness associated with paranoia. The patient has had issues with spending money inappropriately, and managing his finances inappropriately. The patient has a gait disorder, and he uses a walker in the home environment. The patient has not had any recent falls. The patient was placed on Lexapro, and this seemed to help some of his behavior issues, but the patient himself decided not to take the medication. The patient is on clonazepam at this time. The patient complains of being nervous and jittery in the afternoons. The patient is sleeping fairly well at night. The patient returns to this office for an evaluation. The wife has decided not to pursue guardianship proceedings.  Past Medical History  Diagnosis Date  . Carotid artery stenosis   . Gait disturbance   . Anxiety   . GERD (gastroesophageal reflux disease)   . Vitamin B12 deficiency   . Depression   . Osteoarthritis   . Diabetes mellitus, type 2   . Hypertension   . Vitamin D deficiency   . Tremor   . Aortic stenosis   . Hyperlipidemia   . Diverticulosis   . Adenomatous polyp of colon   . Memory loss   . Polyneuropathy in diabetes(357.2)   . Neuropathy     Past Surgical History  Procedure Laterality Date  . Vasectomy    . Lumbar laminectomy      L5 Dr Ophelia Charter  . Transurethral resection of prostate  March 2007  . Cataract extraction, bilateral Bilateral   . Tonsillectomy and adenoidectomy    . Left foot      Family History  Problem Relation Age of Onset  . Diabetes Father   . Colon cancer Brother   . Heart disease Brother   . Diabetes Brother   . Parkinson's disease Sister   . Arthritis Sister     Social history:  reports that he has quit smoking. His smoking use included Cigars. He has never used smokeless  tobacco. He reports that he does not drink alcohol or use illicit drugs.    Allergies  Allergen Reactions  . Citalopram Hydrobromide     REACTION: "crazy"  . Duloxetine     REACTION: did not like    Medications:  Current Outpatient Prescriptions on File Prior to Visit  Medication Sig Dispense Refill  . amLODipine (NORVASC) 10 MG tablet Take 1 tablet (10 mg total) by mouth daily.      Marland Kitchen aspirin EC 81 MG tablet Take 81 mg by mouth daily.      . Cholecalciferol (VITAMIN D3) 1000 UNIT capsule Take 1,000 Units by mouth daily.       . clonazePAM (KLONOPIN) 0.5 MG tablet Take 1 tablet (0.5 mg total) by mouth 3 (three) times daily as needed for anxiety. For anxiety.  90 tablet  3  . cyanocobalamin (,VITAMIN B-12,) 1000 MCG/ML injection Inject 1 mL (1,000 mcg total) into the muscle every 14 (fourteen) days.  1 mL  11  . diclofenac sodium (VOLTAREN) 1 % GEL Apply 2 g topically 2 (two) times daily as needed.      . diphenhydrAMINE (BENADRYL) 25 mg capsule Take 1 capsule (25 mg total) by mouth every 8 (eight) hours as needed for allergies (Muscle spasms or restlessness).  30 capsule  0  . donepezil (ARICEPT) 5  MG tablet Take 1 tablet (5 mg total) by mouth at bedtime.  90 tablet  2  . glipiZIDE (GLUCOTROL XL) 10 MG 24 hr tablet Take 1 tablet (10 mg total) by mouth daily.  90 tablet  3  . metFORMIN (GLUCOPHAGE) 500 MG tablet Take 1,000 mg by mouth daily with breakfast. 1000mg  every morning and 500 mg at night      . Multiple Vitamins-Minerals (ICAPS PO) Take 2 capsules by mouth daily.      . Pyridoxine HCl (VITAMIN B-6 PO) Take 1 tablet by mouth daily.      . valsartan-hydrochlorothiazide (DIOVAN-HCT) 320-12.5 MG per tablet Take 1 tablet by mouth daily.  90 tablet  3  . vitamin C (ASCORBIC ACID) 500 MG tablet Take 500 mg by mouth daily.       No current facility-administered medications on file prior to visit.    ROS:  Out of a complete 14 system review of symptoms, the patient complains only of  the following symptoms, and all other reviewed systems are negative.  Weight loss, fatigue Skin rash, itching Blurred vision Headache, weakness, dizziness Depression, anxiety, decreased energy Insomnia  Blood pressure 124/68, pulse 64, weight 0 lb (0 kg).  Physical Exam  General: The patient is alert and cooperative at the time of the examination.  Skin: No significant peripheral edema is noted.   Neurologic Exam  Mental status: Mini-Mental status examination done today shows a total score 20/30.  Cranial nerves: Facial symmetry is present. Speech is normal, no aphasia or dysarthria is noted. Extraocular movements are full. The patient has prominent downbeat nystagmus bilaterally. Visual fields are full.  Motor: The patient has good strength in all 4 extremities.  Coordination: The patient has good finger-nose-finger and heel-to-shin bilaterally.  Gait and station: The patient is in a wheelchair, and he requires assistance with standing. Once up, the patient can walk short distances with assistance. Romberg is negative, the patient does not fall. Tandem gait was not attempted.  Reflexes: Deep tendon reflexes are symmetric.   Assessment/Plan:  1. Gait disorder  2. Memory disorder  The patient will be restarted on Lexapro this time. This seemed to help him quite a bit. The patient will he maintained on low-dose Aricept. The patient will followup in 6-7 months.  Marlan Palau MD 03/07/2013 7:10 PM  [facility] [address] [place], [place] [address]  Phone [phone] Fax [phone]

## 2013-03-09 ENCOUNTER — Other Ambulatory Visit (INDEPENDENT_AMBULATORY_CARE_PROVIDER_SITE_OTHER): Payer: Medicare Other

## 2013-03-09 DIAGNOSIS — I10 Essential (primary) hypertension: Secondary | ICD-10-CM

## 2013-03-09 DIAGNOSIS — E785 Hyperlipidemia, unspecified: Secondary | ICD-10-CM

## 2013-03-09 DIAGNOSIS — F0391 Unspecified dementia with behavioral disturbance: Secondary | ICD-10-CM

## 2013-03-09 DIAGNOSIS — E119 Type 2 diabetes mellitus without complications: Secondary | ICD-10-CM

## 2013-03-09 DIAGNOSIS — E538 Deficiency of other specified B group vitamins: Secondary | ICD-10-CM

## 2013-03-09 DIAGNOSIS — F411 Generalized anxiety disorder: Secondary | ICD-10-CM

## 2013-03-09 DIAGNOSIS — G4700 Insomnia, unspecified: Secondary | ICD-10-CM

## 2013-03-09 LAB — LIPID PANEL
HDL: 41 mg/dL (ref >39.00)
Total CHOL/HDL Ratio: 4

## 2013-03-09 LAB — BASIC METABOLIC PANEL
BUN: 21 mg/dL (ref 6–23)
Chloride: 108 mEq/L (ref 96–112)
Creatinine, Ser: 0.8 mg/dL (ref 0.4–1.5)
GFR: 93.28 mL/min (ref >60.00)

## 2013-03-09 LAB — TSH: TSH: 1.73 u[IU]/mL (ref 0.35–5.50)

## 2013-03-09 LAB — HEMOGLOBIN A1C: Hgb A1c MFr Bld: 6.1 % (ref 4.6–6.5)

## 2013-03-09 LAB — HEPATIC FUNCTION PANEL
AST: 13 U/L (ref 0–37)
Total Bilirubin: 1.5 mg/dL — ABNORMAL HIGH (ref 0.3–1.2)

## 2013-03-10 ENCOUNTER — Ambulatory Visit: Payer: MEDICARE | Admitting: Internal Medicine

## 2013-03-14 ENCOUNTER — Ambulatory Visit (INDEPENDENT_AMBULATORY_CARE_PROVIDER_SITE_OTHER): Payer: Medicare Other | Admitting: Internal Medicine

## 2013-03-14 ENCOUNTER — Other Ambulatory Visit: Payer: Self-pay | Admitting: *Deleted

## 2013-03-14 ENCOUNTER — Encounter: Payer: Self-pay | Admitting: Internal Medicine

## 2013-03-14 VITALS — BP 95/60 | HR 72 | Temp 98.0°F | Resp 12 | Wt 133.0 lb

## 2013-03-14 DIAGNOSIS — I10 Essential (primary) hypertension: Secondary | ICD-10-CM

## 2013-03-14 DIAGNOSIS — E119 Type 2 diabetes mellitus without complications: Secondary | ICD-10-CM

## 2013-03-14 DIAGNOSIS — M531 Cervicobrachial syndrome: Secondary | ICD-10-CM

## 2013-03-14 DIAGNOSIS — E538 Deficiency of other specified B group vitamins: Secondary | ICD-10-CM

## 2013-03-14 DIAGNOSIS — F0390 Unspecified dementia without behavioral disturbance: Secondary | ICD-10-CM

## 2013-03-14 DIAGNOSIS — E785 Hyperlipidemia, unspecified: Secondary | ICD-10-CM

## 2013-03-14 DIAGNOSIS — M5481 Occipital neuralgia: Secondary | ICD-10-CM

## 2013-03-14 DIAGNOSIS — F329 Major depressive disorder, single episode, unspecified: Secondary | ICD-10-CM

## 2013-03-14 DIAGNOSIS — Z23 Encounter for immunization: Secondary | ICD-10-CM

## 2013-03-14 DIAGNOSIS — F3289 Other specified depressive episodes: Secondary | ICD-10-CM

## 2013-03-14 LAB — GLUCOSE, POCT (MANUAL RESULT ENTRY): POC Glucose: 143 mg/dl — AB (ref 70–99)

## 2013-03-14 MED ORDER — CYANOCOBALAMIN 1000 MCG/ML IJ SOLN: 1000.0000 ug | INTRAMUSCULAR | Status: DC

## 2013-03-14 MED ORDER — AMLODIPINE BESYLATE 10 MG PO TABS: 5.0000 mg | ORAL_TABLET | Freq: Every day | ORAL | Status: DC

## 2013-03-14 MED ORDER — ZOLPIDEM TARTRATE 10 MG PO TABS: 10.0000 mg | ORAL_TABLET | Freq: Every evening | ORAL | Status: DC | PRN

## 2013-03-14 MED ORDER — DONEPEZIL HCL 5 MG PO TABS: 5.0000 mg | ORAL_TABLET | Freq: Every day | ORAL | Status: DC

## 2013-03-14 MED ORDER — POTASSIUM CHLORIDE ER 10 MEQ PO TBCR: 10.0000 meq | EXTENDED_RELEASE_TABLET | Freq: Two times a day (BID) | ORAL | Status: DC

## 2013-03-14 MED ORDER — CLONAZEPAM 0.5 MG PO TABS: 0.5000 mg | ORAL_TABLET | Freq: Three times a day (TID) | ORAL | Status: DC | PRN

## 2013-03-14 MED ORDER — CYANOCOBALAMIN 1000 MCG/ML IJ SOLN
1000.0000 ug | Freq: Once | INTRAMUSCULAR | Status: AC
  Administered 2013-03-14: 1000 ug via INTRAMUSCULAR

## 2013-03-14 MED ORDER — METFORMIN HCL 500 MG PO TABS: 1000.0000 mg | ORAL_TABLET | Freq: Every day | ORAL | Status: DC

## 2013-03-14 NOTE — Assessment & Plan Note (Signed)
Contour pillow We can do a block if needed

## 2013-03-14 NOTE — Assessment & Plan Note (Signed)
Continue with current prescription therapy as reflected on the Med list.  

## 2013-03-14 NOTE — Assessment & Plan Note (Signed)
Discussed - complicated management

## 2013-03-14 NOTE — Assessment & Plan Note (Signed)
Low BP  Reduce Norvasc to 5 mg/d

## 2013-03-14 NOTE — Patient Instructions (Signed)
Contour pillow  

## 2013-03-14 NOTE — Assessment & Plan Note (Signed)
Pt stopped his lexapro

## 2013-03-14 NOTE — Assessment & Plan Note (Signed)
Pt stopped simvastatin

## 2013-03-14 NOTE — Progress Notes (Signed)
   Subjective:     HPI C/o R HA in the back x months  The patient presents for a follow-up of  chronic hypertension, chronic dyslipidemia, type 2 diabetes better controlled, gait issues, LBP 8/10 at times, may be better with medicines - Lyrica prn; not using Sinemet, Duragesic. He started Remeron  - not taking  They saw a psychiatrist - given Seroquel - did not help - they stopped Zolpidem  He was paranoid again recently about wife having an affair etc. He is supposed to be on Namenda and risperidone, but is refusing to take them. He is taking Aricept    Wt Readings from Last 3 Encounters:  03/14/13 133 lb (60.328 kg)  12/18/12 130 lb 4.7 oz (59.1 kg)  12/09/12 132 lb (59.875 kg)   BP Readings from Last 3 Encounters:  03/14/13 95/60  03/07/13 124/68  12/20/12 134/50     Review of Systems  Constitutional: Positive for fatigue. Negative for appetite change and unexpected weight change.  HENT: Negative for nosebleeds, congestion, sneezing and trouble swallowing.   Eyes: Negative for itching and visual disturbance.  Cardiovascular: Negative for palpitations and leg swelling.  Gastrointestinal: Negative for diarrhea, blood in stool and abdominal distention.  Genitourinary: Negative for frequency and hematuria.  Musculoskeletal: Positive for gait problem. Negative for joint swelling.  Skin: Negative for rash.  Neurological: Negative for tremors and speech difficulty.  Psychiatric/Behavioral: Positive for decreased concentration. Negative for suicidal ideas, sleep disturbance, dysphoric mood and agitation. The patient is nervous/anxious.        Objective:   Physical Exam  Constitutional: He is oriented to person, place, and time. He appears well-developed. No distress.  HENT:  Mouth/Throat: Oropharynx is clear and moist.  Eyes: Conjunctivae are normal. Pupils are equal, round, and reactive to light.  Neck: Normal range of motion. No JVD present. No thyromegaly present.   Cardiovascular: Normal rate, regular rhythm, normal heart sounds and intact distal pulses.  Exam reveals no gallop and no friction rub.   No murmur heard. Pulmonary/Chest: Effort normal and breath sounds normal. No respiratory distress. He has no wheezes. He has no rales. He exhibits no tenderness.  Abdominal: Soft. Bowel sounds are normal. He exhibits no distension and no mass. There is no tenderness. There is no rebound and no guarding.  Musculoskeletal: Normal range of motion. He exhibits no edema and no tenderness.  Lymphadenopathy:    He has no cervical adenopathy.  Neurological: He is alert and oriented to person, place, and time. He displays abnormal reflex. No cranial nerve deficit. He exhibits abnormal muscle tone. Coordination abnormal.  W/c Arms w/cogwheel rigidity  Skin: Skin is warm and dry. No rash noted.  Psychiatric: His behavior is normal. Judgment and thought content normal.  depressed  R occip area is senitive  Subjective:       Assessment & Plan:      Assessment & Plan:

## 2013-03-23 ENCOUNTER — Encounter (HOSPITAL_COMMUNITY): Payer: Self-pay | Admitting: *Deleted

## 2013-03-23 ENCOUNTER — Emergency Department (HOSPITAL_COMMUNITY)
Admission: EM | Admit: 2013-03-23 | Discharge: 2013-03-23 | Disposition: A | Discharge status: Home / Self Care | Payer: Medicare Other | Attending: Emergency Medicine | Admitting: Emergency Medicine

## 2013-03-23 DIAGNOSIS — Z862 Personal history of diseases of the blood and blood-forming organs and certain disorders involving the immune mechanism: Secondary | ICD-10-CM | POA: Insufficient documentation

## 2013-03-23 DIAGNOSIS — M1990 Unspecified osteoarthritis, unspecified site: Secondary | ICD-10-CM | POA: Insufficient documentation

## 2013-03-23 DIAGNOSIS — Z87891 Personal history of nicotine dependence: Secondary | ICD-10-CM | POA: Insufficient documentation

## 2013-03-23 DIAGNOSIS — Z7982 Long term (current) use of aspirin: Secondary | ICD-10-CM | POA: Insufficient documentation

## 2013-03-23 DIAGNOSIS — F22 Delusional disorders: Secondary | ICD-10-CM | POA: Insufficient documentation

## 2013-03-23 DIAGNOSIS — Z8639 Personal history of other endocrine, nutritional and metabolic disease: Secondary | ICD-10-CM | POA: Insufficient documentation

## 2013-03-23 DIAGNOSIS — F329 Major depressive disorder, single episode, unspecified: Secondary | ICD-10-CM | POA: Insufficient documentation

## 2013-03-23 DIAGNOSIS — Z8719 Personal history of other diseases of the digestive system: Secondary | ICD-10-CM | POA: Insufficient documentation

## 2013-03-23 DIAGNOSIS — I10 Essential (primary) hypertension: Secondary | ICD-10-CM | POA: Insufficient documentation

## 2013-03-23 DIAGNOSIS — F411 Generalized anxiety disorder: Secondary | ICD-10-CM | POA: Insufficient documentation

## 2013-03-23 DIAGNOSIS — E1142 Type 2 diabetes mellitus with diabetic polyneuropathy: Secondary | ICD-10-CM | POA: Insufficient documentation

## 2013-03-23 DIAGNOSIS — Z79899 Other long term (current) drug therapy: Secondary | ICD-10-CM | POA: Insufficient documentation

## 2013-03-23 DIAGNOSIS — F3289 Other specified depressive episodes: Secondary | ICD-10-CM | POA: Insufficient documentation

## 2013-03-23 DIAGNOSIS — E1149 Type 2 diabetes mellitus with other diabetic neurological complication: Secondary | ICD-10-CM | POA: Insufficient documentation

## 2013-03-23 DIAGNOSIS — Z860100 Personal history of colon polyps, unspecified: Secondary | ICD-10-CM | POA: Insufficient documentation

## 2013-03-23 HISTORY — DX: Unspecified dementia, unspecified severity, without behavioral disturbance, psychotic disturbance, mood disturbance, and anxiety: F03.90

## 2013-03-23 LAB — URINALYSIS, ROUTINE W REFLEX MICROSCOPIC
Bilirubin Urine: NEGATIVE
Glucose, UA: NEGATIVE mg/dL
Hgb urine dipstick: NEGATIVE
Ketones, ur: NEGATIVE mg/dL
Leukocytes, UA: NEGATIVE
Nitrite: NEGATIVE
Protein, ur: 100 mg/dL — AB
Specific Gravity, Urine: 1.017 (ref 1.005–1.030)
Urobilinogen, UA: 1 mg/dL (ref 0.0–1.0)
pH: 6 (ref 5.0–8.0)

## 2013-03-23 LAB — URINE MICROSCOPIC-ADD ON

## 2013-03-23 NOTE — ED Notes (Signed)
Pt refusing blood draws.  Dr. Juleen China notified.

## 2013-03-23 NOTE — BHH Counselor (Addendum)
Writer consulted with Dr. Juleen China regarding the patient not meeting criteria for inpatient hospitalization and receiving outpatient resources for marriage counseling.   Patient reports that he is able to contact his sister and her care giver in the morning in order to pick him up from school.

## 2013-03-23 NOTE — ED Notes (Signed)
Patient's sister, Patsy Lager, may be called at [phone].

## 2013-03-23 NOTE — ED Notes (Signed)
Patient taking cab home, per request. This RN noted sufficient cash in patient's wallet and patient able to verbalize location he would be headed to.

## 2013-03-23 NOTE — ED Provider Notes (Signed)
CSN: [id number]     Arrival date & time 03/23/13  0104 History   First MD Initiated Contact with Patient 03/23/13 [phone]     Chief Complaint  Patient presents with  . Medical Clearance   (Consider location/radiation/quality/duration/timing/severity/associated sxs/prior Treatment) HPI  79 year old male brought in by police for evaluation. Patient reports that he got into a verbal argument with his wife over finances and then wanted to leave and spend the night in a hotel. He was concerned that he may be poisoned by his wife or otherwise harmed and may not wake up in the morning. He denies any suicidal homicidal ideation. He denies hearing any voices or having any visual hallucinations. He states that he does not feel he needs medical evaluation but would like to sleep here until morning. He has no complaints.  Past Medical History  Diagnosis Date  . Carotid artery stenosis   . Gait disturbance   . Anxiety   . GERD (gastroesophageal reflux disease)   . Vitamin B12 deficiency   . Depression   . Osteoarthritis   . Diabetes mellitus, type 2   . Hypertension   . Vitamin D deficiency   . Tremor   . Aortic stenosis   . Hyperlipidemia   . Diverticulosis   . Adenomatous polyp of colon   . Memory loss   . Polyneuropathy in diabetes(357.2)   . Neuropathy   . Dementia    Past Surgical History  Procedure Laterality Date  . Vasectomy    . Lumbar laminectomy      L5 Dr Ophelia Charter  . Transurethral resection of prostate  March 2007  . Cataract extraction, bilateral Bilateral   . Tonsillectomy and adenoidectomy    . Left foot     Family History  Problem Relation Age of Onset  . Diabetes Father   . Colon cancer Brother   . Heart disease Brother   . Diabetes Brother   . Parkinson's disease Sister   . Arthritis Sister    History  Substance Use Topics  . Smoking status: Former Smoker    Types: Cigars  . Smokeless tobacco: Never Used  . Alcohol Use: No    Review of Systems  All  systems reviewed and negative, other than as noted in HPI.   Allergies  Citalopram hydrobromide and Duloxetine  Home Medications   Current Outpatient Rx  Name  Route  Sig  Dispense  Refill  . amLODipine (NORVASC) 10 MG tablet   Oral   Take 0.5 tablets (5 mg total) by mouth daily.         Marland Kitchen aspirin EC 81 MG tablet   Oral   Take 81 mg by mouth daily.         . Cholecalciferol (VITAMIN D3) 1000 UNIT capsule   Oral   Take 1,000 Units by mouth daily.          . clonazePAM (KLONOPIN) 0.5 MG tablet   Oral   Take 1 tablet (0.5 mg total) by mouth 3 (three) times daily as needed for anxiety. For anxiety.   90 tablet   3   . cyanocobalamin (,VITAMIN B-12,) 1000 MCG/ML injection   Intramuscular   Inject 1 mL (1,000 mcg total) into the muscle every 14 (fourteen) days.   10 mL   3   . diphenhydrAMINE (BENADRYL) 25 mg capsule   Oral   Take 1 capsule (25 mg total) by mouth every 8 (eight) hours as needed for allergies (Muscle spasms or restlessness).  30 capsule   0   . donepezil (ARICEPT) 5 MG tablet   Oral   Take 1 tablet (5 mg total) by mouth at bedtime.   90 tablet   2   . glipiZIDE (GLUCOTROL XL) 10 MG 24 hr tablet   Oral   Take 1 tablet (10 mg total) by mouth daily.   90 tablet   3   . metFORMIN (GLUCOPHAGE) 500 MG tablet   Oral   Take 2 tablets (1,000 mg total) by mouth daily with breakfast. 1000mg  every morning and 500 mg at night   90 tablet   3   . Multiple Vitamins-Minerals (ICAPS PO)   Oral   Take 2 capsules by mouth daily.         . potassium chloride (K-DUR) 10 MEQ tablet   Oral   Take 1 tablet (10 mEq total) by mouth 2 (two) times daily.   90 tablet   3   . Pyridoxine HCl (VITAMIN B-6 PO)   Oral   Take 1 tablet by mouth daily.         . valsartan-hydrochlorothiazide (DIOVAN-HCT) 320-12.5 MG per tablet   Oral   Take 1 tablet by mouth daily.   90 tablet   3   . vitamin C (ASCORBIC ACID) 500 MG tablet   Oral   Take 500 mg by mouth  daily.         Marland Kitchen zolpidem (AMBIEN) 10 MG tablet   Oral   Take 1 tablet (10 mg total) by mouth at bedtime as needed for sleep.   90 tablet   1   . diclofenac sodium (VOLTAREN) 1 % GEL   Topical   Apply 2 g topically 2 (two) times daily as needed. For pain          BP 171/95  Pulse 108  Temp(Src) 98.5 F (36.9 C) (Oral)  Resp 20  SpO2 98% Physical Exam  Nursing note and vitals reviewed. Constitutional: He appears well-developed and well-nourished. No distress.  HENT:  Head: Normocephalic and atraumatic.  Eyes: Conjunctivae are normal. Right eye exhibits no discharge. Left eye exhibits no discharge.  Neck: Neck supple.  Cardiovascular: Normal rate, regular rhythm and normal heart sounds.  Exam reveals no gallop and no friction rub.   No murmur heard. Pulmonary/Chest: Effort normal and breath sounds normal. No respiratory distress.  Abdominal: Soft. He exhibits no distension. There is no tenderness.  Musculoskeletal: He exhibits no edema and no tenderness.  Neurological: He is alert. No cranial nerve deficit. He exhibits normal muscle tone. Coordination normal.  Skin: Skin is warm and dry.  Psychiatric: He has a normal mood and affect. His behavior is normal. Thought content normal.  Speech is clear. Aside from some vague paranoia concerning being possibly poisoned in his sleep, his responses to questioning are appropriate.     ED Course  Procedures (including critical care time) Labs Review Labs Reviewed  URINALYSIS, ROUTINE W REFLEX MICROSCOPIC - Abnormal; Notable for the following:    Protein, ur 100 (*)    All other components within normal limits  URINE MICROSCOPIC-ADD ON   Imaging Review No results found.  MDM   1. Paranoia     79 year old male brought in because of some paranoid thoughts. Per review of records, he seems to have a history of this. Recent labs reviewed. He has had blood work in the past three weeks. This does not appear to be new behavior  and I feel further emergent  testing is of little utility. Patient is otherwise not showing signs of psychosis and is calm/cooperative.  He is not suicidal or homicidal. I do not feel that he fits involuntary commitment criteria. He was assessed by the TSS team. Will hold patient in the emergency room until the morning and then discharge.    Raeford Razor, MD 03/28/13 1743

## 2013-03-23 NOTE — BH Assessment (Signed)
Assessment Note   Patient is an 79 year old white male.  Patient was alert and orientated X4 throughout the session.   Patient displayed no symptoms of confusion or impairment.  Patient reports that he called the police because he did not feel safe at home because he got into an argument with his wife over finances.  Documentation in the reports epic chart reports that his wife refuses to take out IVC papers on the patient.    Patient reports that he wanted the police to take him to a hotel but they brought him here.   Patient reports that he does not feel as if he needs a medical evaluation but would like to sleep here until morning.   Patient denies SI/HI.  Patient denies psychosis.  Patient denies previous psychiatric hospitalizations.  Patient denies a history of mental illness.  Patient denies Patient does not meet criteria for inpatient hospitalization.       Axis I: No diagnosis evident  Axis II: Deferred Axis III:  Past Medical History  Diagnosis Date  . Carotid artery stenosis   . Gait disturbance   . Anxiety   . GERD (gastroesophageal reflux disease)   . Vitamin B12 deficiency   . Depression   . Osteoarthritis   . Diabetes mellitus, type 2   . Hypertension   . Vitamin D deficiency   . Tremor   . Aortic stenosis   . Hyperlipidemia   . Diverticulosis   . Adenomatous polyp of colon   . Memory loss   . Polyneuropathy in diabetes(357.2)   . Neuropathy   . Dementia    Axis IV: other psychosocial or environmental problems and problems related to social environment Axis V: 51-60 moderate symptoms  Past Medical History:  Past Medical History  Diagnosis Date  . Carotid artery stenosis   . Gait disturbance   . Anxiety   . GERD (gastroesophageal reflux disease)   . Vitamin B12 deficiency   . Depression   . Osteoarthritis   . Diabetes mellitus, type 2   . Hypertension   . Vitamin D deficiency   . Tremor   . Aortic stenosis   . Hyperlipidemia   . Diverticulosis   .  Adenomatous polyp of colon   . Memory loss   . Polyneuropathy in diabetes(357.2)   . Neuropathy   . Dementia     Past Surgical History  Procedure Laterality Date  . Vasectomy    . Lumbar laminectomy      L5 Dr Ophelia Charter  . Transurethral resection of prostate  March 2007  . Cataract extraction, bilateral Bilateral   . Tonsillectomy and adenoidectomy    . Left foot      Family History:  Family History  Problem Relation Age of Onset  . Diabetes Father   . Colon cancer Brother   . Heart disease Brother   . Diabetes Brother   . Parkinson's disease Sister   . Arthritis Sister     Social History:  reports that he has quit smoking. His smoking use included Cigars. He has never used smokeless tobacco. He reports that he does not drink alcohol or use illicit drugs.  Additional Social History:     CIWA: CIWA-Ar BP: 171/95 mmHg Pulse Rate: 108 COWS:    Allergies:  Allergies  Allergen Reactions  . Citalopram Hydrobromide     REACTION: "crazy"  . Duloxetine     REACTION: did not like    Home Medications:  (Not in a  hospital admission)  OB/GYN Status:  No LMP for male patient.  General Assessment Data Location of Assessment: [facility] ED Is this a Tele or Face-to-Face Assessment?: Face-to-Face Is this an Initial Assessment or a Re-assessment for this encounter?: Initial Assessment Living Arrangements: Spouse/significant other Can pt return to current living arrangement?: Yes Admission Status: Voluntary Is patient capable of signing voluntary admission?: Yes Transfer from: Other (Comment) (Police brought the patient to the ER ) Referral Source: Self/Family/Friend  Medical Screening Exam [facility] Walk-in ONLY) Medical Exam completed: Yes  [facility] Crisis Care Plan Living Arrangements: Spouse/significant other  Education Status Is patient currently in school?: No  Risk to self Suicidal Ideation: No Suicidal Intent: No Is patient at risk for suicide?: No Suicidal Plan?:  No Access to Means: No What has been your use of drugs/alcohol within the last 12 months?: none  Previous Attempts/Gestures: No How many times?: 0 Other Self Harm Risks: none  Triggers for Past Attempts:  (na) Intentional Self Injurious Behavior: None Family Suicide History: No Recent stressful life event(s): Other (Comment) (Patient won the lottery.) Persecutory voices/beliefs?: No Depression: No Depression Symptoms:  (na) Substance abuse history and/or treatment for substance abuse?: No Suicide prevention information given to non-admitted patients: Not applicable  Risk to Others Homicidal Ideation: No Thoughts of Harm to Others: No Current Homicidal Intent: No Current Homicidal Plan: No Access to Homicidal Means: No Identified Victim: N/A History of harm to others?: No Assessment of Violence: None Noted Violent Behavior Description: calm Does patient have access to weapons?: No Criminal Charges Pending?: No Does patient have a court date: No  Psychosis Hallucinations: None noted Delusions: None noted  Mental Status Report Appear/Hygiene: Disheveled Eye Contact: Good Motor Activity: Freedom of movement Speech: Logical/coherent Level of Consciousness: Quiet/awake Mood: Anxious Affect: Appropriate to circumstance Anxiety Level: Minimal Thought Processes: Coherent;Relevant Judgement: Unimpaired Orientation: Person;Place;Time;Situation Obsessive Compulsive Thoughts/Behaviors: None  Cognitive Functioning Concentration: Decreased Memory: Recent Intact;Remote Intact IQ: Average Insight: Good Impulse Control: Good Appetite: Fair Weight Loss: 0 Weight Gain: 0 Sleep: Decreased Total Hours of Sleep: 3 Vegetative Symptoms: None  ADLScreening [facility] Assessment Services) Patient's cognitive ability adequate to safely complete daily activities?: Yes Patient able to express need for assistance with ADLs?: Yes Independently performs ADLs?: Yes (appropriate for  developmental age) (Patient uses a walker.)  Prior Inpatient Therapy Prior Inpatient Therapy: No Prior Therapy Dates: na Prior Therapy Facilty/Provider(s): na Reason for Treatment: na  Prior Outpatient Therapy Prior Outpatient Therapy: No Prior Therapy Dates: na Prior Therapy Facilty/Provider(s): na Reason for Treatment: na  ADL Screening (condition at time of admission) Patient's cognitive ability adequate to safely complete daily activities?: Yes Patient able to express need for assistance with ADLs?: Yes Independently performs ADLs?: Yes (appropriate for developmental age) (Patient uses a walker.)                  Additional Information 1:1 In Past 12 Months?: No CIRT Risk: No Elopement Risk: No Does patient have medical clearance?: Yes     Disposition:  Disposition Initial Assessment Completed for this Encounter: Yes Disposition of Patient: Other dispositions Other disposition(s): Other (Comment)  On Site Evaluation by:   Reviewed with Physician:    Phillip Heal LaVerne 03/23/2013 5:34 AM
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
## 2013-03-23 NOTE — ED Notes (Signed)
Charge talked with wife and told her pt did not want to have any visitors and that hospital staff could not discuss medical information with wife. Wife reported she is power of attorney but does not have paperwork on her. Charge informed wife that unless she has the documents with her wife cannot be told any information. pts wife going home to get documents.

## 2013-03-29 ENCOUNTER — Telehealth: Payer: Self-pay | Admitting: Neurology

## 2013-03-29 NOTE — Telephone Encounter (Signed)
I called the patient talked with the wife. The patient has had increased problems with agitation, calling the police frequently, paranoia. The patient does better on Lexapro, but he refuses to take the medication. I will try to work in revisit. In the meantime, he will stop the Aricept, and the Ambien will be reduced from 15 mg to 10 mg at night.

## 2013-03-31 ENCOUNTER — Ambulatory Visit (INDEPENDENT_AMBULATORY_CARE_PROVIDER_SITE_OTHER): Payer: Medicare Other | Admitting: Neurology

## 2013-03-31 ENCOUNTER — Ambulatory Visit: Payer: Self-pay | Admitting: Neurology

## 2013-03-31 ENCOUNTER — Telehealth: Payer: Self-pay | Admitting: Neurology

## 2013-03-31 ENCOUNTER — Encounter: Payer: Self-pay | Admitting: Neurology

## 2013-03-31 VITALS — BP 117/64 | HR 57 | Temp 98.0°F | Wt 135.0 lb

## 2013-03-31 DIAGNOSIS — F329 Major depressive disorder, single episode, unspecified: Secondary | ICD-10-CM

## 2013-03-31 DIAGNOSIS — R269 Unspecified abnormalities of gait and mobility: Secondary | ICD-10-CM

## 2013-03-31 DIAGNOSIS — R413 Other amnesia: Secondary | ICD-10-CM

## 2013-03-31 MED ORDER — SERTRALINE HCL 20 MG/ML PO CONC: ORAL | Status: DC

## 2013-03-31 NOTE — Telephone Encounter (Signed)
Pharmacy will order liquid form for patient.

## 2013-03-31 NOTE — Progress Notes (Signed)
Reason for visit: Dementia  [name] is an 79 y.o. male  History of present illness:  [name] is an 79 year old right-handed white male with a history of a progressive dementing illness. Over time, the patient has had increasing problems with paranoia and suspicion. The patient believes that his wife is stealing money from him, and stealing objects from him inside the house, and trying to poison him. The patient has repeatedly called the police, and the police confiscated two guns that he had at home. The patient has also called the FBI to discuss problems with the government. The patient has a lot of left hip and leg pain, and he does not walk that much in the house. The patient is on clonazepam taking 0.5 mg tablets 3 times a day if needed. The patient indicates that this does help some of his agitation. The patient however, stopped the Lexapro, but the wife indicated that this medication did help his behavior some. The patient wants to talk to an attorney to get his money back from his wife. The patient returns to this office for an evaluation.  Past Medical History  Diagnosis Date  . Carotid artery stenosis   . Gait disturbance   . Anxiety   . GERD (gastroesophageal reflux disease)   . Vitamin B12 deficiency   . Depression   . Osteoarthritis   . Diabetes mellitus, type 2   . Hypertension   . Vitamin D deficiency   . Tremor   . Aortic stenosis   . Hyperlipidemia   . Diverticulosis   . Adenomatous polyp of colon   . Memory loss   . Polyneuropathy in diabetes(357.2)   . Neuropathy   . Dementia     Past Surgical History  Procedure Laterality Date  . Vasectomy    . Lumbar laminectomy      L5 Dr Ophelia Charter  . Transurethral resection of prostate  March 2007  . Cataract extraction, bilateral Bilateral   . Tonsillectomy and adenoidectomy    . Left foot      Family History  Problem Relation Age of Onset  . Diabetes Father   . Colon cancer Brother   . Heart disease  Brother   . Diabetes Brother   . Parkinson's disease Sister   . Arthritis Sister     Social history:  reports that he has quit smoking. His smoking use included Cigars. He has never used smokeless tobacco. He reports that he does not drink alcohol or use illicit drugs.    Allergies  Allergen Reactions  . Citalopram Hydrobromide     REACTION: "crazy"  . Duloxetine     REACTION: did not like    Medications:  Current Outpatient Prescriptions on File Prior to Visit  Medication Sig Dispense Refill  . amLODipine (NORVASC) 10 MG tablet Take 0.5 tablets (5 mg total) by mouth daily.      Marland Kitchen aspirin EC 81 MG tablet Take 81 mg by mouth daily.      . Cholecalciferol (VITAMIN D3) 1000 UNIT capsule Take 1,000 Units by mouth daily.       . clonazePAM (KLONOPIN) 0.5 MG tablet Take 1 tablet (0.5 mg total) by mouth 3 (three) times daily as needed for anxiety. For anxiety.  90 tablet  3  . cyanocobalamin (,VITAMIN B-12,) 1000 MCG/ML injection Inject 1 mL (1,000 mcg total) into the muscle every 14 (fourteen) days.  10 mL  3  . diclofenac sodium (VOLTAREN) 1 % GEL Apply 2  g topically 2 (two) times daily as needed. For pain      . diphenhydrAMINE (BENADRYL) 25 mg capsule Take 1 capsule (25 mg total) by mouth every 8 (eight) hours as needed for allergies (Muscle spasms or restlessness).  30 capsule  0  . glipiZIDE (GLUCOTROL XL) 10 MG 24 hr tablet Take 1 tablet (10 mg total) by mouth daily.  90 tablet  3  . metFORMIN (GLUCOPHAGE) 500 MG tablet Take 2 tablets (1,000 mg total) by mouth daily with breakfast. 1000mg  every morning and 500 mg at night  90 tablet  3  . Multiple Vitamins-Minerals (ICAPS PO) Take 2 capsules by mouth daily.      . potassium chloride (K-DUR) 10 MEQ tablet Take 1 tablet (10 mEq total) by mouth 2 (two) times daily.  90 tablet  3  . Pyridoxine HCl (VITAMIN B-6 PO) Take 1 tablet by mouth daily.      . valsartan-hydrochlorothiazide (DIOVAN-HCT) 320-12.5 MG per tablet Take 1 tablet by mouth  daily.  90 tablet  3  . vitamin C (ASCORBIC ACID) 500 MG tablet Take 500 mg by mouth daily.      Marland Kitchen zolpidem (AMBIEN) 10 MG tablet Take 1 tablet (10 mg total) by mouth at bedtime as needed for sleep.  90 tablet  1  . donepezil (ARICEPT) 5 MG tablet Take 1 tablet (5 mg total) by mouth at bedtime.  90 tablet  2   No current facility-administered medications on file prior to visit.    ROS:  Out of a complete 14 system review of symptoms, the patient complains only of the following symptoms, and all other reviewed systems are negative.  Memory disturbance, confusion, weakness, dizziness Weight loss, fatigue Double vision Joint pain Depression, anxiety, decreased energy, insomnia  Blood pressure 117/64, pulse 57, temperature 98 F (36.7 C), temperature source Oral, weight 135 lb (61.236 kg).  Physical Exam  General: The patient is alert and cooperative at the time of the examination.  Skin: No significant peripheral edema is noted.   Neurologic Exam  Mental status: Mini-Mental status examination done today shows a total score 25/30.  Cranial nerves: Facial symmetry is present. Speech is normal, no aphasia or dysarthria is noted. The patient does have word finding problems, with rambling speech. Extraocular movements are full. Visual fields are full.  Motor: The patient has good strength in all 4 extremities.  Coordination: The patient has good finger-nose-finger and heel-to-shin bilaterally.  Gait and station: The patient is in a wheelchair, but he can stand with assistance. The patient will walk short distances with assistance. Tandem gait was not attempted.  Reflexes: Deep tendon reflexes are symmetric.   Assessment/Plan:  1. Progressive dementia  2. Paranoia, agitation  The patient is having increasing problems with behavior associated with paranoia and agitation. The patient is repetitively calling the police. The patient will be placed on Zoloft, with the liquid form  that the wife can add to his food to get the medication into him. The patient may require a referral to a psychiatrist, but the patient does not wish to pursue this avenue of treatment. The patient will followup in about 6 months. The patient is having increasing problems with word finding, and the Mini-Mental status examination confirms a progression in his memory problem. The patient was taken off of Aricept for now, as this sometimes can create paranoia and psychosis.  Marlan Palau MD 03/31/2013 2:05 PM  [facility] [address] [place], [place]  [address]  Phone [phone] Fax [phone]

## 2013-04-11 ENCOUNTER — Emergency Department (HOSPITAL_COMMUNITY)
Admission: EM | Admit: 2013-04-11 | Discharge: 2013-04-12 | Disposition: A | Discharge status: Other Acute Inpatient Hospital | Payer: Medicare Other | Attending: Emergency Medicine | Admitting: Emergency Medicine

## 2013-04-11 ENCOUNTER — Emergency Department (HOSPITAL_COMMUNITY): Payer: Medicare Other

## 2013-04-11 ENCOUNTER — Encounter (HOSPITAL_COMMUNITY): Payer: Self-pay | Admitting: Emergency Medicine

## 2013-04-11 DIAGNOSIS — E876 Hypokalemia: Secondary | ICD-10-CM

## 2013-04-11 DIAGNOSIS — F0391 Unspecified dementia with behavioral disturbance: Secondary | ICD-10-CM

## 2013-04-11 DIAGNOSIS — Z0289 Encounter for other administrative examinations: Secondary | ICD-10-CM | POA: Insufficient documentation

## 2013-04-11 DIAGNOSIS — F3289 Other specified depressive episodes: Secondary | ICD-10-CM | POA: Insufficient documentation

## 2013-04-11 DIAGNOSIS — E785 Hyperlipidemia, unspecified: Secondary | ICD-10-CM | POA: Insufficient documentation

## 2013-04-11 DIAGNOSIS — E559 Vitamin D deficiency, unspecified: Secondary | ICD-10-CM | POA: Insufficient documentation

## 2013-04-11 DIAGNOSIS — F22 Delusional disorders: Secondary | ICD-10-CM

## 2013-04-11 DIAGNOSIS — I6529 Occlusion and stenosis of unspecified carotid artery: Secondary | ICD-10-CM | POA: Insufficient documentation

## 2013-04-11 DIAGNOSIS — F0390 Unspecified dementia without behavioral disturbance: Secondary | ICD-10-CM | POA: Insufficient documentation

## 2013-04-11 DIAGNOSIS — M1990 Unspecified osteoarthritis, unspecified site: Secondary | ICD-10-CM | POA: Insufficient documentation

## 2013-04-11 DIAGNOSIS — Z87891 Personal history of nicotine dependence: Secondary | ICD-10-CM | POA: Insufficient documentation

## 2013-04-11 DIAGNOSIS — E1142 Type 2 diabetes mellitus with diabetic polyneuropathy: Secondary | ICD-10-CM | POA: Insufficient documentation

## 2013-04-11 DIAGNOSIS — K219 Gastro-esophageal reflux disease without esophagitis: Secondary | ICD-10-CM | POA: Insufficient documentation

## 2013-04-11 DIAGNOSIS — E538 Deficiency of other specified B group vitamins: Secondary | ICD-10-CM | POA: Insufficient documentation

## 2013-04-11 DIAGNOSIS — F329 Major depressive disorder, single episode, unspecified: Secondary | ICD-10-CM

## 2013-04-11 DIAGNOSIS — I359 Nonrheumatic aortic valve disorder, unspecified: Secondary | ICD-10-CM | POA: Insufficient documentation

## 2013-04-11 DIAGNOSIS — E1149 Type 2 diabetes mellitus with other diabetic neurological complication: Secondary | ICD-10-CM | POA: Insufficient documentation

## 2013-04-11 DIAGNOSIS — Z79899 Other long term (current) drug therapy: Secondary | ICD-10-CM | POA: Insufficient documentation

## 2013-04-11 DIAGNOSIS — Z7982 Long term (current) use of aspirin: Secondary | ICD-10-CM | POA: Insufficient documentation

## 2013-04-11 DIAGNOSIS — R339 Retention of urine, unspecified: Secondary | ICD-10-CM | POA: Insufficient documentation

## 2013-04-11 DIAGNOSIS — F411 Generalized anxiety disorder: Secondary | ICD-10-CM

## 2013-04-11 LAB — CBC WITH DIFFERENTIAL/PLATELET
Basophils Absolute: 0 10*3/uL (ref 0.0–0.1)
Basophils Relative: 0 % (ref 0–1)
Eosinophils Absolute: 0 10*3/uL (ref 0.0–0.7)
Eosinophils Relative: 1 % (ref 0–5)
Hemoglobin: 13.5 g/dL (ref 13.0–17.0)
Lymphs Abs: 3.2 10*3/uL (ref 0.7–4.0)
MCHC: 36.1 g/dL — ABNORMAL HIGH (ref 30.0–36.0)
MCV: 95.2 fL (ref 78.0–100.0)
Monocytes Relative: 14 % — ABNORMAL HIGH (ref 3–12)
Neutro Abs: 3 10*3/uL (ref 1.7–7.7)
Neutrophils Relative %: 41 % — ABNORMAL LOW (ref 43–77)
Platelets: 249 10*3/uL (ref 150–400)
RDW: 14.4 % (ref 11.5–15.5)

## 2013-04-11 LAB — URINALYSIS, ROUTINE W REFLEX MICROSCOPIC
Ketones, ur: NEGATIVE mg/dL
Leukocytes, UA: NEGATIVE
Nitrite: NEGATIVE
Specific Gravity, Urine: 1.017 (ref 1.005–1.030)
Urobilinogen, UA: 0.2 mg/dL (ref 0.0–1.0)
pH: 7 (ref 5.0–8.0)

## 2013-04-11 LAB — BASIC METABOLIC PANEL
Calcium: 10 mg/dL (ref 8.4–10.5)
Chloride: 104 mEq/L (ref 96–112)
Creatinine, Ser: 0.9 mg/dL (ref 0.50–1.35)
GFR calc Af Amer: 87 mL/min — ABNORMAL LOW (ref >90)
GFR calc non Af Amer: 75 mL/min — ABNORMAL LOW (ref >90)
Glucose, Bld: 98 mg/dL (ref 70–99)
Potassium: 3 mEq/L — ABNORMAL LOW (ref 3.5–5.1)
Sodium: 137 mEq/L (ref 135–145)

## 2013-04-11 LAB — RAPID URINE DRUG SCREEN, HOSP PERFORMED
Barbiturates: NOT DETECTED
Cocaine: NOT DETECTED
Opiates: NOT DETECTED
Tetrahydrocannabinol: NOT DETECTED

## 2013-04-11 LAB — URINE MICROSCOPIC-ADD ON

## 2013-04-11 LAB — ETHANOL: Alcohol, Ethyl (B): <11 mg/dL (ref 0–11)

## 2013-04-11 MED ORDER — STERILE WATER FOR INJECTION IJ SOLN
INTRAMUSCULAR | Status: AC
  Administered 2013-04-11: 10 mL
  Filled 2013-04-11: qty 10

## 2013-04-11 MED ORDER — ZOLPIDEM TARTRATE 10 MG PO TABS
10.0000 mg | ORAL_TABLET | Freq: Every evening | ORAL | Status: DC | PRN
  Administered 2013-04-11: 10 mg via ORAL
  Filled 2013-04-11: qty 1

## 2013-04-11 MED ORDER — POTASSIUM CHLORIDE ER 10 MEQ PO TBCR
10.0000 meq | EXTENDED_RELEASE_TABLET | Freq: Two times a day (BID) | ORAL | Status: DC
  Administered 2013-04-11 – 2013-04-12 (×3): 10 meq via ORAL
  Filled 2013-04-11 (×5): qty 1

## 2013-04-11 MED ORDER — OLANZAPINE 10 MG IM SOLR
2.5000 mg | Freq: Once | INTRAMUSCULAR | Status: AC
  Administered 2013-04-11: 2.5 mg via INTRAMUSCULAR
  Filled 2013-04-11: qty 10

## 2013-04-11 MED ORDER — POTASSIUM CHLORIDE CRYS ER 20 MEQ PO TBCR
40.0000 meq | EXTENDED_RELEASE_TABLET | Freq: Once | ORAL | Status: AC
  Administered 2013-04-12: 40 meq via ORAL
  Filled 2013-04-11: qty 2

## 2013-04-11 MED ORDER — AMLODIPINE BESYLATE 5 MG PO TABS
5.0000 mg | ORAL_TABLET | Freq: Every day | ORAL | Status: DC
  Administered 2013-04-11 – 2013-04-12 (×2): 5 mg via ORAL
  Filled 2013-04-11 (×3): qty 1

## 2013-04-11 MED ORDER — POTASSIUM CHLORIDE 20 MEQ/15ML (10%) PO LIQD
40.0000 meq | Freq: Once | ORAL | Status: AC
  Administered 2013-04-11: 40 meq via ORAL
  Filled 2013-04-11: qty 30

## 2013-04-11 MED ORDER — CLONAZEPAM 0.5 MG PO TABS
0.5000 mg | ORAL_TABLET | Freq: Three times a day (TID) | ORAL | Status: DC | PRN
  Administered 2013-04-12 (×2): 0.5 mg via ORAL
  Filled 2013-04-11 (×2): qty 1

## 2013-04-11 MED ORDER — SERTRALINE HCL 20 MG/ML PO CONC: 50.0000 mg | Freq: Every day | ORAL | Status: DC

## 2013-04-11 MED ORDER — IBUPROFEN 200 MG PO TABS
600.0000 mg | ORAL_TABLET | Freq: Three times a day (TID) | ORAL | Status: DC | PRN
  Administered 2013-04-12: 600 mg via ORAL
  Filled 2013-04-11: qty 3

## 2013-04-11 MED ORDER — GLIPIZIDE ER 10 MG PO TB24
10.0000 mg | ORAL_TABLET | Freq: Every day | ORAL | Status: DC
  Administered 2013-04-11: 10 mg via ORAL
  Filled 2013-04-11 (×3): qty 1

## 2013-04-11 MED ORDER — ONDANSETRON HCL 4 MG PO TABS: 4.0000 mg | ORAL_TABLET | Freq: Three times a day (TID) | ORAL | Status: DC | PRN

## 2013-04-11 MED ORDER — CLONAZEPAM 0.5 MG PO TABS: 0.5000 mg | ORAL_TABLET | Freq: Once | ORAL | Status: DC

## 2013-04-11 MED ORDER — ASPIRIN EC 81 MG PO TBEC
81.0000 mg | DELAYED_RELEASE_TABLET | Freq: Every day | ORAL | Status: DC
  Administered 2013-04-11 – 2013-04-12 (×2): 81 mg via ORAL
  Filled 2013-04-11 (×3): qty 1

## 2013-04-11 MED ORDER — SERTRALINE HCL 20 MG/ML PO CONC: 20.0000 mg | Freq: Every day | ORAL | Status: DC

## 2013-04-11 MED ORDER — METFORMIN HCL 500 MG PO TABS
500.0000 mg | ORAL_TABLET | Freq: Every day | ORAL | Status: DC
  Administered 2013-04-11: 500 mg via ORAL
  Filled 2013-04-11 (×3): qty 1

## 2013-04-11 MED ORDER — VALSARTAN-HYDROCHLOROTHIAZIDE 320-12.5 MG PO TABS: 1.0000 | ORAL_TABLET | Freq: Every day | ORAL | Status: DC

## 2013-04-11 MED ORDER — METFORMIN HCL 500 MG PO TABS
1000.0000 mg | ORAL_TABLET | Freq: Every day | ORAL | Status: DC
  Administered 2013-04-12: 1000 mg via ORAL
  Filled 2013-04-11 (×3): qty 2

## 2013-04-11 MED ORDER — ACETAMINOPHEN 325 MG PO TABS: 650.0000 mg | ORAL_TABLET | ORAL | Status: DC | PRN

## 2013-04-11 MED ORDER — IRBESARTAN 300 MG PO TABS
300.0000 mg | ORAL_TABLET | Freq: Every day | ORAL | Status: DC
  Administered 2013-04-11 – 2013-04-12 (×2): 300 mg via ORAL
  Filled 2013-04-11 (×3): qty 1

## 2013-04-11 MED ORDER — SERTRALINE HCL 20 MG/ML PO CONC
50.0000 mg | Freq: Every day | ORAL | Status: DC
  Administered 2013-04-11: 50 mg via ORAL
  Filled 2013-04-11 (×2): qty 2.5

## 2013-04-11 MED ORDER — HYDROCHLOROTHIAZIDE 12.5 MG PO CAPS
12.5000 mg | ORAL_CAPSULE | Freq: Every day | ORAL | Status: DC
  Administered 2013-04-11 – 2013-04-12 (×2): 12.5 mg via ORAL
  Filled 2013-04-11 (×3): qty 1

## 2013-04-11 NOTE — ED Notes (Signed)
Patient has 1 bag of belongings located in locker #26 in [place].
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
## 2013-04-11 NOTE — ED Notes (Signed)
Per pt, states unable to urinate since 6am -not drinking a lot of fluids per wife-gave him flomax around 7am

## 2013-04-11 NOTE — ED Notes (Signed)
Report given to Jessica, RN.

## 2013-04-11 NOTE — ED Notes (Addendum)
Pt has hx of dementia. Pt was in hospital in May for pneumonia, pt pulled out catheter during that stay, has been followed by urologist since then. Pt was taking flomax for urinary retension, but stopped taking the medicine due to he was urinating every 2 hours. Pts family reports he urinated at 0130 but not since then. Pt reported some lower abdominal pain and chest pain at 0600. Family unsure if pt has hx of a fib. Pt denies pain at present. rn and md assisted pt to use urinal. Pt urinated less than 5 ml.   Pt keeps requesting to see the police officer, pt angry and paranoid, saying he will not take medications and that his family member is blocking his 911 calls.
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
## 2013-04-11 NOTE — ED Notes (Signed)
Due to pt getting skin tear [employer] came and took pt and rn statement
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
## 2013-04-11 NOTE — Progress Notes (Addendum)
CSW followed up with [facility].  Per Victorino Dike, pts information is still pending review. CSW followed up with [facility]. Per, French Ana pt is still pending. She will have SW [facility] call CSW tomorrow morning. CSW followed up with [facility].  Per Lloyd Huger he did not have a referral for this pt, but currently does not have any geri-psych beds available   .Marva Panda, LCSWA  [phone] .04/11/2013

## 2013-04-11 NOTE — ED Notes (Signed)
Bed: WA26 Expected date:  Expected time:  Means of arrival:  Comments: 
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
## 2013-04-11 NOTE — Progress Notes (Signed)
Pt wife contact information Miklo Aken home [phone] cell [phone]. [facility] Dr. Anne Hahn.   Frutoso Schatz [phone]  ED CSW 04/11/2013 10:15am

## 2013-04-11 NOTE — Progress Notes (Signed)
CSW received call from [place] at [facility].  Pt has been accepted by Dr. Retia Passe.  Per Victorino Dike pts potassium levels are low and Dr. Prudencio Pair would like that treated prior to pt coming to [facility] in the am.  Victorino Dike also requests that a copy of pts IVC paperwork be faxed prior to transport.  Nurse report can be called in to [phone].  Daytime CSW to follow up with completing requests prior to transport.  CSW notified EDP Romeo Apple of need of treatment of pts potassium levels prior to transport in the am.  CSW notified Najah at [facility] that pt has been accepted by [facility].  Marva Panda, Theresia Majors  [phone]  .04/11/2013  11:45 pm
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
## 2013-04-11 NOTE — Progress Notes (Signed)
Pt referred to [facility] pending review.  CSW left message for [facility] and faxed referral.  No beds available at [place], [place].   Catha Gosselin, [place] [phone]  ED CSW 04/11/13 1304pm

## 2013-04-12 ENCOUNTER — Encounter: Payer: Self-pay | Admitting: Neurology

## 2013-04-12 ENCOUNTER — Telehealth: Payer: Self-pay | Admitting: Neurology

## 2013-04-12 LAB — BASIC METABOLIC PANEL
BUN: 16 mg/dL (ref 6–23)
CO2: 25 mEq/L (ref 19–32)
Chloride: 104 mEq/L (ref 96–112)
Creatinine, Ser: 0.94 mg/dL (ref 0.50–1.35)
GFR calc non Af Amer: 74 mL/min — ABNORMAL LOW (ref >90)
Glucose, Bld: 86 mg/dL (ref 70–99)
Potassium: 3.9 mEq/L (ref 3.5–5.1)
Sodium: 136 mEq/L (ref 135–145)

## 2013-04-12 MED ORDER — SERTRALINE HCL 50 MG PO TABS
50.0000 mg | ORAL_TABLET | Freq: Every day | ORAL | Status: DC
  Administered 2013-04-12: 50 mg via ORAL
  Filled 2013-04-12: qty 1

## 2013-04-12 NOTE — Consult Note (Signed)
[facility] Face-to-Face Psychiatry Consult   Reason for Consult:  Evaluation for inpatient treatment Referring Physician:  EDP  [name] is an 79 y.o. male.  Assessment: AXIS I:  Dementia with paranoia and behavior disturbance AXIS II:  Deferred AXIS III:   Past Medical History  Diagnosis Date  . Carotid artery stenosis   . Gait disturbance   . Anxiety   . GERD (gastroesophageal reflux disease)   . Vitamin B12 deficiency   . Depression   . Osteoarthritis   . Diabetes mellitus, type 2   . Hypertension   . Vitamin D deficiency   . Tremor   . Aortic stenosis   . Hyperlipidemia   . Diverticulosis   . Adenomatous polyp of colon   . Memory loss   . Polyneuropathy in diabetes(357.2)   . Neuropathy   . Dementia    AXIS IV:  other psychosocial or environmental problems and problems with primary support group AXIS V:  21-30 behavior considerably influenced by delusions or hallucinations OR serious impairment in judgment, communication OR inability to function in almost all areas  Plan:  Recommend geriatric psych inpatient treatment   Subjective:   [name] is a 79 y.o. male.  HPI: Patient is much improved today. Patient knows that his wife moved money from account but his reactions were amplified with the paranoia and anger from not being able to understand why money moved and rational behind it.  Patient was able to tell the day of week, month and year.  Patient was able to tell month prior (September) and months backwards but getting sept and Aug mixed up.  Patient relaxed sitting in bed and wife by his side.  At home prior to visit patient had called police multiple times, and wanted to contact FBI about his money being stolen; along with increased paranoia.  Patient states that he slept well last night and is eating well today.  Patient is much calmer today.  HPI Elements:   Location:  [facility] ED. Quality:  affecting patient mentally. Severity:  paranoid  about wife.  Past Psychiatric History: Past Medical History  Diagnosis Date  . Carotid artery stenosis   . Gait disturbance   . Anxiety   . GERD (gastroesophageal reflux disease)   . Vitamin B12 deficiency   . Depression   . Osteoarthritis   . Diabetes mellitus, type 2   . Hypertension   . Vitamin D deficiency   . Tremor   . Aortic stenosis   . Hyperlipidemia   . Diverticulosis   . Adenomatous polyp of colon   . Memory loss   . Polyneuropathy in diabetes(357.2)   . Neuropathy   . Dementia     reports that he has quit smoking. His smoking use included Cigars. He has never used smokeless tobacco. He reports that he does not drink alcohol or use illicit drugs. Family History  Problem Relation Age of Onset  . Diabetes Father   . Colon cancer Brother   . Heart disease Brother   . Diabetes Brother   . Parkinson's disease Sister   . Arthritis Sister    Family History Substance Abuse: No Family Supports: Yes, List: (spouse, 3 children ) Living Arrangements: Spouse/significant other Can pt return to current living arrangement?: Yes   Allergies:   Allergies  Allergen Reactions  . Citalopram Hydrobromide     REACTION: "crazy"  . Duloxetine     REACTION: did not like    ACT  Assessment Complete:  No:   Past Psychiatric History: Diagnosis:  Dementia with paranoia and behavioral disturbance  Hospitalizations:    Outpatient Care:    Substance Abuse Care:    Self-Mutilation:    Suicidal Attempts:    Homicidal Behaviors:     Violent Behaviors:     Place of Residence:   Marital Status:   Employed/Unemployed:   Education:   Family Supports:   Objective: Blood pressure 126/52, pulse 50, temperature 98.2 F (36.8 C), temperature source Oral, resp. rate 16, SpO2 99.00%.There is no weight on file to calculate BMI. Results for orders placed during the hospital encounter of 04/11/13 (from the past 72 hour(s))  CBC WITH DIFFERENTIAL     Status: Abnormal   Collection Time     04/11/13  8:55 AM      Result Value Range   WBC 7.2  4.0 - 10.5 K/uL   RBC 3.93 (*) 4.22 - 5.81 MIL/uL   Hemoglobin 13.5  13.0 - 17.0 g/dL   HCT 16.1 (*) 09.6 - 04.5 %   MCV 95.2  78.0 - 100.0 fL   MCH 34.4 (*) 26.0 - 34.0 pg   MCHC 36.1 (*) 30.0 - 36.0 g/dL   RDW 40.9  81.1 - 91.4 %   Platelets 249  150 - 400 K/uL   Neutrophils Relative % 41 (*) 43 - 77 %   Neutro Abs 3.0  1.7 - 7.7 K/uL   Lymphocytes Relative 44  12 - 46 %   Lymphs Abs 3.2  0.7 - 4.0 K/uL   Monocytes Relative 14 (*) 3 - 12 %   Monocytes Absolute 1.0  0.1 - 1.0 K/uL   Eosinophils Relative 1  0 - 5 %   Eosinophils Absolute 0.0  0.0 - 0.7 K/uL   Basophils Relative 0  0 - 1 %   Basophils Absolute 0.0  0.0 - 0.1 K/uL  BASIC METABOLIC PANEL     Status: Abnormal   Collection Time    04/11/13  8:55 AM      Result Value Range   Sodium 137  135 - 145 mEq/L   Potassium 3.0 (*) 3.5 - 5.1 mEq/L   Chloride 104  96 - 112 mEq/L   CO2 23  19 - 32 mEq/L   Glucose, Bld 98  70 - 99 mg/dL   BUN 16  6 - 23 mg/dL   Creatinine, Ser 7.82  0.50 - 1.35 mg/dL   Calcium 95.6  8.4 - 21.3 mg/dL   GFR calc non Af Amer 75 (*) >90 mL/min   GFR calc Af Amer 87 (*) >90 mL/min   Comment: (NOTE)     The eGFR has been calculated using the CKD EPI equation.     This calculation has not been validated in all clinical situations.     eGFR's persistently <90 mL/min signify possible Chronic Kidney     Disease.  URINALYSIS, ROUTINE W REFLEX MICROSCOPIC     Status: Abnormal   Collection Time    04/11/13  8:55 AM      Result Value Range   Color, Urine YELLOW  YELLOW   APPearance CLEAR  CLEAR   Specific Gravity, Urine 1.017  1.005 - 1.030   pH 7.0  5.0 - 8.0   Glucose, UA NEGATIVE  NEGATIVE mg/dL   Hgb urine dipstick NEGATIVE  NEGATIVE   Bilirubin Urine NEGATIVE  NEGATIVE   Ketones, ur NEGATIVE  NEGATIVE mg/dL   Protein, ur 30 (*) NEGATIVE  mg/dL   Urobilinogen, UA 0.2  0.0 - 1.0 mg/dL   Nitrite NEGATIVE  NEGATIVE   Leukocytes, UA NEGATIVE   NEGATIVE  ETHANOL     Status: None   Collection Time    04/11/13  8:55 AM      Result Value Range   Alcohol, Ethyl (B) <11  0 - 11 mg/dL   Comment:            LOWEST DETECTABLE LIMIT FOR     SERUM ALCOHOL IS 11 mg/dL     FOR MEDICAL PURPOSES ONLY  URINE RAPID DRUG SCREEN (HOSP PERFORMED)     Status: None   Collection Time    04/11/13  8:55 AM      Result Value Range   Opiates NONE DETECTED  NONE DETECTED   Cocaine NONE DETECTED  NONE DETECTED   Benzodiazepines NONE DETECTED  NONE DETECTED   Amphetamines NONE DETECTED  NONE DETECTED   Tetrahydrocannabinol NONE DETECTED  NONE DETECTED   Barbiturates NONE DETECTED  NONE DETECTED   Comment:            DRUG SCREEN FOR MEDICAL PURPOSES     ONLY.  IF CONFIRMATION IS NEEDED     FOR ANY PURPOSE, NOTIFY LAB     WITHIN 5 DAYS.                LOWEST DETECTABLE LIMITS     FOR URINE DRUG SCREEN     Drug Class       Cutoff (ng/mL)     Amphetamine      1000     Barbiturate      200     Benzodiazepine   200     Tricyclics       300     Opiates          300     Cocaine          300     THC              50  URINE MICROSCOPIC-ADD ON     Status: None   Collection Time    04/11/13  8:55 AM      Result Value Range   Squamous Epithelial / LPF RARE  RARE   WBC, UA 0-2  <3 WBC/hpf   Bacteria, UA RARE  RARE   Urine-Other MUCOUS PRESENT    GLUCOSE, CAPILLARY     Status: None   Collection Time    04/12/13  7:30 AM      Result Value Range   Glucose-Capillary 83  70 - 99 mg/dL   Comment 1 Notify RN    BASIC METABOLIC PANEL     Status: Abnormal   Collection Time    04/12/13  8:03 AM      Result Value Range   Sodium 136  135 - 145 mEq/L   Potassium 3.9  3.5 - 5.1 mEq/L   Comment: DELTA CHECK NOTED     REPEATED TO VERIFY     NO VISIBLE HEMOLYSIS   Chloride 104  96 - 112 mEq/L   CO2 25  19 - 32 mEq/L   Glucose, Bld 86  70 - 99 mg/dL   BUN 16  6 - 23 mg/dL   Creatinine, Ser 0.98  0.50 - 1.35 mg/dL   Calcium 9.4  8.4 - 11.9 mg/dL   GFR  calc non Af Amer 74 (*) >90 mL/min   GFR calc Af Denyse Dago  85 (*) >90 mL/min   Comment: (NOTE)     The eGFR has been calculated using the CKD EPI equation.     This calculation has not been validated in all clinical situations.     eGFR's persistently <90 mL/min signify possible Chronic Kidney     Disease.   Labs are reviewed and are pertinent for .  Current Facility-Administered Medications  Medication Dose Route Frequency Provider Last Rate Last Dose  . acetaminophen (TYLENOL) tablet 650 mg  650 mg Oral Q4H PRN Lyanne Co, MD      . amLODipine (NORVASC) tablet 5 mg  5 mg Oral Daily Lyanne Co, MD   5 mg at 04/12/13 1008  . aspirin EC tablet 81 mg  81 mg Oral Daily Lyanne Co, MD   81 mg at 04/12/13 1008  . clonazePAM (KLONOPIN) tablet 0.5 mg  0.5 mg Oral TID PRN Lyanne Co, MD   0.5 mg at 04/12/13 0402  . glipiZIDE (GLUCOTROL XL) 24 hr tablet 10 mg  10 mg Oral Daily Lyanne Co, MD   10 mg at 04/11/13 1131  . irbesartan (AVAPRO) tablet 300 mg  300 mg Oral Daily Lyanne Co, MD   300 mg at 04/12/13 1009   And  . hydrochlorothiazide (MICROZIDE) capsule 12.5 mg  12.5 mg Oral Daily Lyanne Co, MD   12.5 mg at 04/12/13 1009  . ibuprofen (ADVIL,MOTRIN) tablet 600 mg  600 mg Oral Q8H PRN Lyanne Co, MD      . metFORMIN (GLUCOPHAGE) tablet 1,000 mg  1,000 mg Oral Q breakfast Lyanne Co, MD   1,000 mg at 04/12/13 0844  . metFORMIN (GLUCOPHAGE) tablet 500 mg  500 mg Oral Q supper Lyanne Co, MD   500 mg at 04/11/13 1834  . ondansetron (ZOFRAN) tablet 4 mg  4 mg Oral Q8H PRN Lyanne Co, MD      . potassium chloride (K-DUR) CR tablet 10 mEq  10 mEq Oral BID Lyanne Co, MD   10 mEq at 04/12/13 1012  . sertraline (ZOLOFT) tablet 50 mg  50 mg Oral Daily Hurman Horn, MD   50 mg at 04/12/13 1012  . zolpidem (AMBIEN) tablet 10 mg  10 mg Oral QHS PRN Lyanne Co, MD   10 mg at 04/11/13 2100   Current Outpatient Prescriptions  Medication Sig Dispense Refill   . amLODipine (NORVASC) 10 MG tablet Take 0.5 tablets (5 mg total) by mouth daily.      Marland Kitchen aspirin EC 81 MG tablet Take 81 mg by mouth daily.      . Cholecalciferol (VITAMIN D3) 1000 UNIT capsule Take 1,000 Units by mouth daily.       . clonazePAM (KLONOPIN) 0.5 MG tablet Take 1 tablet (0.5 mg total) by mouth 3 (three) times daily as needed for anxiety. For anxiety.  90 tablet  3  . cyanocobalamin (,VITAMIN B-12,) 1000 MCG/ML injection Inject 1 mL (1,000 mcg total) into the muscle every 14 (fourteen) days.  10 mL  3  . diclofenac sodium (VOLTAREN) 1 % GEL Apply 2 g topically 2 (two) times daily as needed. For pain      . donepezil (ARICEPT) 5 MG tablet Take 1 tablet (5 mg total) by mouth at bedtime.  90 tablet  2  . glipiZIDE (GLUCOTROL XL) 10 MG 24 hr tablet Take 1 tablet (10 mg total) by mouth daily.  90 tablet  3  .  metFORMIN (GLUCOPHAGE) 500 MG tablet Take 2 tablets (1,000 mg total) by mouth daily with breakfast. 1000mg  every morning and 500 mg at night  90 tablet  3  . Multiple Vitamins-Minerals (ICAPS PO) Take 2 capsules by mouth daily.      . potassium chloride (K-DUR) 10 MEQ tablet Take 1 tablet (10 mEq total) by mouth 2 (two) times daily.  90 tablet  3  . Pyridoxine HCl (VITAMIN B-6 PO) Take 1 tablet by mouth daily.      . sertraline (ZOLOFT) 20 MG/ML concentrated solution Take 20 mg by mouth daily. One cc daily for one week, then take 2 cc daily      . valsartan-hydrochlorothiazide (DIOVAN-HCT) 320-12.5 MG per tablet Take 1 tablet by mouth daily.  90 tablet  3  . vitamin C (ASCORBIC ACID) 500 MG tablet Take 500 mg by mouth daily.      Marland Kitchen zolpidem (AMBIEN) 10 MG tablet Take 1 tablet (10 mg total) by mouth at bedtime as needed for sleep.  90 tablet  1    Psychiatric Specialty Exam:     Blood pressure 126/52, pulse 50, temperature 98.2 F (36.8 C), temperature source Oral, resp. rate 16, SpO2 99.00%.There is no weight on file to calculate BMI.  General Appearance: Disheveled  Eye  Contact::  Good  Speech:  Clear and Coherent  Volume:  Normal  Mood:  Anxious and Irritable  Affect:  Blunt  Thought Process:  Circumstantial and Disorganized  Orientation:  Other:  Person and time  Thought Content:  Paranoid Ideation and Rumination  Suicidal Thoughts:  No  Homicidal Thoughts:  No  Memory:  Immediate;   Poor Recent;   Poor Remote;   Poor  Judgement:  Impaired  Insight:  Lacking  Psychomotor Activity:  Restlessness  Concentration:  Poor  Recall:  Poor  Akathisia:  No  Handed:  Right  AIMS (if indicated):     Assets:  Housing Social Support Transportation  Sleep:      Face to face interview and consult with Dr. Lolly Mustache  Treatment Plan Summary: Daily contact with patient to assess and evaluate symptoms and progress in treatment Medication management  Disposition:  Will continue with current treatment plan for inpatient geri psych.  Patient accepted to [facility].  Rankin, Shuvon, FNP-BC 04/12/2013 11:00 AM  I have personally seen the patient and agreed with the findings and involved in the treatment plan. Kathryne Sharper, MD

## 2013-04-12 NOTE — Progress Notes (Addendum)
Pt accepted to [facility] by Dr. Perfecto Kingdom. CSW informed RN who will arrange sherrif transport and ptar. Per sheriff, they will call when they have transport available, at that time RN will need to inform ptar or gcem of transport to coordinate with sherrif. Sheriff transport [phone] and ptar [phone] or [phone]. Patient wife provided with contact information for [place] and driving directions. When patient is ready for transport RN to inform EDP of discharge and provide EDP with Dr. Arvella Nigh accepting physician name. Pt will discharge with IVC papers.   Catha Gosselin, LCSW [phone]  ED CSW .04/12/2013 1429pm

## 2013-04-12 NOTE — ED Notes (Signed)
Pt moved to room 23; wife has all of patients belongings and is taking them home with her. Will give report to oncoming RN regarding plan for discharge and transportation

## 2013-04-12 NOTE — ED Notes (Signed)
Bed: WA23 Expected date:  Expected time:  Means of arrival:  Comments: 

## 2013-04-12 NOTE — Progress Notes (Signed)
Pt wife requesting CSW speak with Dr. Anne Hahn regarding pt continuing to contact his lawyer, and her concerns with his new dementia diagnosis. CSW spoke with pt nurse regarding pt wife concerns and asked nurse to call wife to follow up at [phone] or home [phone].  Marland KitchenCatha Gosselin, [place] [phone]  ED CSW .04/12/2013 1012am

## 2013-04-12 NOTE — Telephone Encounter (Signed)
I called the social worker and I left a message. The patient clearly has dementia, with severe paranoia and agitation. [facility]. I'll write a letter for the wife concerning the dementia.

## 2013-04-12 NOTE — Progress Notes (Addendum)
Pt accepted to [facility], by Dr. Perfecto Kingdom pending potassium levels, and anticipated discharges today.  CSW spoke with EDP to order updated potassium level lab. CSW will fax along with pt ivc papers to [facility] to [phone].   Catha Gosselin, LCSW [phone]  ED CSW .04/12/2013 7:49am   Pt accepted to [facility] by Dr. Perfecto Kingdom. Pt bed anticipated to be available later today. CSW awaiting return call from [facility] admitting. Olegario Messier can be reached at [phone].   Marland KitchenCatha Gosselin, [place] [phone]  ED CSW .04/12/2013 11:33am

## 2013-04-12 NOTE — ED Notes (Signed)
Report called to Lynden Ang, Charity fundraiser at [facility], who stated understanding and denied questions/concerns

## 2013-04-12 NOTE — Telephone Encounter (Signed)
I have received two different calls in regards to this patient.  The first one was from [place] with [facility], who was called out to speak with patient.  He is in a constant dispute with his wife over money, very anxious and wife is concerned for her own safety.  She has faxed over an assessment on the patient and is trying to coordinate care to better serve patient.  The second call was from [place] a Child psychotherapist at [employer].  The patient is now an inpatient at [facility] and they are trying to get him in [facility].  She is trying to help wife and asked if Dr. Anne Hahn would write a letter for the wife that states the patient has a diagnosis of dementia and does not have the capacity to make his own decisions.  This way the wife would have something to present to bank and attorney, since there is no POA in place.  It seems as if there has been some issues with him call the attorney.   Please advise

## 2013-04-12 NOTE — BH Assessment (Signed)
Writer called [facility] to check bed availability. Per Olegario Messier, no available bed yet, but Olegario Messier will call as soon as one is available. Writer notified pt's RN [employer].  Evette Cristal, [place] Assessment Counselor

## 2013-04-12 NOTE — ED Notes (Signed)
PTAR and sheriff transport arranged to be here for pt ETA ;

## 2013-04-13 NOTE — Consult Note (Signed)
Note reviewed and agreed with  

## 2013-04-22 ENCOUNTER — Encounter (HOSPITAL_COMMUNITY): Payer: Self-pay | Admitting: Emergency Medicine

## 2013-04-22 ENCOUNTER — Emergency Department (HOSPITAL_COMMUNITY)
Admission: EM | Admit: 2013-04-22 | Discharge: 2013-04-22 | Disposition: A | Discharge status: Home / Self Care | Payer: MEDICARE | Attending: Emergency Medicine | Admitting: Emergency Medicine

## 2013-04-22 ENCOUNTER — Emergency Department (HOSPITAL_COMMUNITY): Payer: MEDICARE

## 2013-04-22 DIAGNOSIS — Z862 Personal history of diseases of the blood and blood-forming organs and certain disorders involving the immune mechanism: Secondary | ICD-10-CM | POA: Insufficient documentation

## 2013-04-22 DIAGNOSIS — S161XXA Strain of muscle, fascia and tendon at neck level, initial encounter: Secondary | ICD-10-CM

## 2013-04-22 DIAGNOSIS — Z7982 Long term (current) use of aspirin: Secondary | ICD-10-CM | POA: Insufficient documentation

## 2013-04-22 DIAGNOSIS — Z79899 Other long term (current) drug therapy: Secondary | ICD-10-CM | POA: Insufficient documentation

## 2013-04-22 DIAGNOSIS — F411 Generalized anxiety disorder: Secondary | ICD-10-CM | POA: Insufficient documentation

## 2013-04-22 DIAGNOSIS — M545 Low back pain, unspecified: Secondary | ICD-10-CM | POA: Insufficient documentation

## 2013-04-22 DIAGNOSIS — S0990XA Unspecified injury of head, initial encounter: Secondary | ICD-10-CM

## 2013-04-22 DIAGNOSIS — W19XXXA Unspecified fall, initial encounter: Secondary | ICD-10-CM

## 2013-04-22 DIAGNOSIS — Z8719 Personal history of other diseases of the digestive system: Secondary | ICD-10-CM | POA: Insufficient documentation

## 2013-04-22 DIAGNOSIS — M542 Cervicalgia: Secondary | ICD-10-CM | POA: Insufficient documentation

## 2013-04-22 DIAGNOSIS — F329 Major depressive disorder, single episode, unspecified: Secondary | ICD-10-CM | POA: Insufficient documentation

## 2013-04-22 DIAGNOSIS — I10 Essential (primary) hypertension: Secondary | ICD-10-CM | POA: Insufficient documentation

## 2013-04-22 DIAGNOSIS — G8911 Acute pain due to trauma: Secondary | ICD-10-CM | POA: Insufficient documentation

## 2013-04-22 DIAGNOSIS — E1149 Type 2 diabetes mellitus with other diabetic neurological complication: Secondary | ICD-10-CM | POA: Insufficient documentation

## 2013-04-22 DIAGNOSIS — F3289 Other specified depressive episodes: Secondary | ICD-10-CM | POA: Insufficient documentation

## 2013-04-22 DIAGNOSIS — E1142 Type 2 diabetes mellitus with diabetic polyneuropathy: Secondary | ICD-10-CM | POA: Insufficient documentation

## 2013-04-22 DIAGNOSIS — F0390 Unspecified dementia without behavioral disturbance: Secondary | ICD-10-CM | POA: Insufficient documentation

## 2013-04-22 DIAGNOSIS — R51 Headache: Secondary | ICD-10-CM | POA: Insufficient documentation

## 2013-04-22 DIAGNOSIS — Z860100 Personal history of colon polyps, unspecified: Secondary | ICD-10-CM | POA: Insufficient documentation

## 2013-04-22 DIAGNOSIS — Z8639 Personal history of other endocrine, nutritional and metabolic disease: Secondary | ICD-10-CM | POA: Insufficient documentation

## 2013-04-22 DIAGNOSIS — Z87891 Personal history of nicotine dependence: Secondary | ICD-10-CM | POA: Insufficient documentation

## 2013-04-22 DIAGNOSIS — S300XXA Contusion of lower back and pelvis, initial encounter: Secondary | ICD-10-CM

## 2013-04-22 DIAGNOSIS — M1990 Unspecified osteoarthritis, unspecified site: Secondary | ICD-10-CM | POA: Insufficient documentation

## 2013-04-22 MED ORDER — TRAMADOL HCL 50 MG PO TABS: 50.0000 mg | ORAL_TABLET | Freq: Four times a day (QID) | ORAL | Status: DC | PRN

## 2013-04-22 NOTE — ED Notes (Addendum)
Family reports that the patient fell while at [facility].  Family member states that the medical center completed x-rays and discharged the patient the next day. Pt states that while falling, he hit the back of his head and his back. Pt reports posterior headache, lower back pain, and right hip pain.  Family member states she made a follow up appointment for this coming Thursday, however pt reports increasing pain. Pt was sent to [facility] to manage aggressive behavior. Pt denies any injury, trauma, or additional fall since Wednesday.

## 2013-04-22 NOTE — ED Provider Notes (Signed)
CSN: [id number]     Arrival date & time 04/22/13  1159 History   First MD Initiated Contact with Patient 04/22/13 1201     Chief Complaint  Patient presents with  . Fall   (Consider location/radiation/quality/duration/timing/severity/associated sxs/prior Treatment) HPI Comments: Patient is an 79 year old male with past medical history of dementia. He was recently admitted to the [facility] behavioral unit. He was there for several days. On Wednesday when walking to the bathroom he was knocked over by a door which caused him to fall backward and strike the back of his head and lower back. He has been having pain in these areas since that time. His wife states that he had x-rays performed while he was there but these were unremarkable. He is having persistent discomfort when he tries to move and also describes headache. He denies any neck pain.  Patient is a 79 y.o. male presenting with fall. The history is provided by the patient.  Fall This is a new problem. Episode onset: 3 days ago. The problem occurs constantly. The problem has not changed since onset.Associated symptoms include headaches. Pertinent negatives include no chest pain and no shortness of breath. Associated symptoms comments: Back pain. The symptoms are aggravated by walking (Movement, palpation). Nothing relieves the symptoms. He has tried nothing for the symptoms.    Past Medical History  Diagnosis Date  . Carotid artery stenosis   . Gait disturbance   . Anxiety   . GERD (gastroesophageal reflux disease)   . Vitamin B12 deficiency   . Depression   . Osteoarthritis   . Diabetes mellitus, type 2   . Hypertension   . Vitamin D deficiency   . Tremor   . Aortic stenosis   . Hyperlipidemia   . Diverticulosis   . Adenomatous polyp of colon   . Memory loss   . Polyneuropathy in diabetes(357.2)   . Neuropathy   . Dementia    Past Surgical History  Procedure Laterality Date  . Vasectomy    . Lumbar laminectomy       L5 Dr Ophelia Charter  . Transurethral resection of prostate  March 2007  . Cataract extraction, bilateral Bilateral   . Tonsillectomy and adenoidectomy    . Left foot     Family History  Problem Relation Age of Onset  . Diabetes Father   . Colon cancer Brother   . Heart disease Brother   . Diabetes Brother   . Parkinson's disease Sister   . Arthritis Sister    History  Substance Use Topics  . Smoking status: Former Smoker    Types: Cigars  . Smokeless tobacco: Never Used  . Alcohol Use: No    Review of Systems  Respiratory: Negative for shortness of breath.   Cardiovascular: Negative for chest pain.  Neurological: Positive for headaches.  All other systems reviewed and are negative.    Allergies  Citalopram hydrobromide and Duloxetine  Home Medications   Current Outpatient Rx  Name  Route  Sig  Dispense  Refill  . amLODipine (NORVASC) 10 MG tablet   Oral   Take 0.5 tablets (5 mg total) by mouth daily.         Marland Kitchen aspirin EC 81 MG tablet   Oral   Take 81 mg by mouth daily.         . Cholecalciferol (VITAMIN D3) 1000 UNIT capsule   Oral   Take 1,000 Units by mouth daily.          Marland Kitchen  clonazePAM (KLONOPIN) 0.5 MG tablet   Oral   Take 1 tablet (0.5 mg total) by mouth 3 (three) times daily as needed for anxiety. For anxiety.   90 tablet   3   . cyanocobalamin (,VITAMIN B-12,) 1000 MCG/ML injection   Intramuscular   Inject 1 mL (1,000 mcg total) into the muscle every 14 (fourteen) days.   10 mL   3   . diclofenac sodium (VOLTAREN) 1 % GEL   Topical   Apply 2 g topically 2 (two) times daily as needed. For pain         . donepezil (ARICEPT) 5 MG tablet   Oral   Take 1 tablet (5 mg total) by mouth at bedtime.   90 tablet   2   . glipiZIDE (GLUCOTROL XL) 10 MG 24 hr tablet   Oral   Take 1 tablet (10 mg total) by mouth daily.   90 tablet   3   . metFORMIN (GLUCOPHAGE) 500 MG tablet   Oral   Take 2 tablets (1,000 mg total) by mouth daily with breakfast.  1000mg  every morning and 500 mg at night   90 tablet   3   . Multiple Vitamins-Minerals (ICAPS PO)   Oral   Take 2 capsules by mouth daily.         . potassium chloride (K-DUR) 10 MEQ tablet   Oral   Take 1 tablet (10 mEq total) by mouth 2 (two) times daily.   90 tablet   3   . Pyridoxine HCl (VITAMIN B-6 PO)   Oral   Take 1 tablet by mouth daily.         . sertraline (ZOLOFT) 20 MG/ML concentrated solution   Oral   Take 20 mg by mouth daily. One cc daily for one week, then take 2 cc daily         . valsartan-hydrochlorothiazide (DIOVAN-HCT) 320-12.5 MG per tablet   Oral   Take 1 tablet by mouth daily.   90 tablet   3   . vitamin C (ASCORBIC ACID) 500 MG tablet   Oral   Take 500 mg by mouth daily.         Marland Kitchen zolpidem (AMBIEN) 10 MG tablet   Oral   Take 1 tablet (10 mg total) by mouth at bedtime as needed for sleep.   90 tablet   1    BP 126/66  Pulse 64  Temp(Src) 98.3 F (36.8 C) (Oral)  Resp 17  SpO2 98% Physical Exam  Nursing note and vitals reviewed. Constitutional: He is oriented to person, place, and time. He appears well-developed and well-nourished.  Patient is an elderly male in no acute distress. He does appear to be somewhat confused  HENT:  Head: Normocephalic and atraumatic.  Mouth/Throat: Oropharynx is clear and moist.  Eyes: EOM are normal. Pupils are equal, round, and reactive to light.  Neck: Normal range of motion. Neck supple.  There is no cervical spine tenderness to palpation and no step-offs.  Cardiovascular: Normal rate, regular rhythm and normal heart sounds.   No murmur heard. Pulmonary/Chest: Effort normal and breath sounds normal. No respiratory distress. He has no wheezes.  Abdominal: Soft. Bowel sounds are normal. He exhibits no distension. There is no tenderness.  Musculoskeletal: Normal range of motion. He exhibits no edema.  There is tenderness to palpation in the lumbar region. There are no step-offs.   Lymphadenopathy:    He has no cervical adenopathy.  Neurological: He is  alert and oriented to person, place, and time. No cranial nerve deficit. He exhibits normal muscle tone. Coordination normal.  Patellar tendon reflexes are 1+ and equal bilaterally. Strength is 5 out of 5 in the bilateral lower extremities appear  Skin: Skin is warm and dry.    ED Course  Procedures (including critical care time) Labs Review Labs Reviewed - No data to display Imaging Review No results found.  EKG Interpretation   None       MDM  No diagnosis found. Patient presents here after a fall at [facility] unit 3 days ago. He is complaining of headache and back pain. CT of the head, cervical spine, and lumbar spine were performed and reveal no evidence for intracranial injury or vertebral fracture. He is neurologically intact and there is no lower extremity weakness or bowel or bladder complaints to suggest a cauda equina situation. I have offered pain medication which the patient is reluctant to take. He will be discharged to home with tramadol and instructions to followup with his doctor if not improving in the next 4-5 days.    Geoffery Lyons, MD 04/22/13 [phone]

## 2013-04-23 ENCOUNTER — Observation Stay (HOSPITAL_COMMUNITY)
Admission: EM | Admit: 2013-04-23 | Discharge: 2013-04-24 | Disposition: A | Discharge status: Home / Self Care | Payer: MEDICARE | Attending: Internal Medicine | Admitting: Internal Medicine

## 2013-04-23 ENCOUNTER — Emergency Department (HOSPITAL_COMMUNITY): Payer: MEDICARE

## 2013-04-23 ENCOUNTER — Encounter (HOSPITAL_COMMUNITY): Payer: Self-pay | Admitting: Emergency Medicine

## 2013-04-23 DIAGNOSIS — D126 Benign neoplasm of colon, unspecified: Secondary | ICD-10-CM

## 2013-04-23 DIAGNOSIS — J189 Pneumonia, unspecified organism: Secondary | ICD-10-CM

## 2013-04-23 DIAGNOSIS — K5790 Diverticulosis of intestine, part unspecified, without perforation or abscess without bleeding: Secondary | ICD-10-CM

## 2013-04-23 DIAGNOSIS — R413 Other amnesia: Secondary | ICD-10-CM

## 2013-04-23 DIAGNOSIS — F329 Major depressive disorder, single episode, unspecified: Secondary | ICD-10-CM

## 2013-04-23 DIAGNOSIS — I4891 Unspecified atrial fibrillation: Secondary | ICD-10-CM

## 2013-04-23 DIAGNOSIS — I701 Atherosclerosis of renal artery: Secondary | ICD-10-CM | POA: Insufficient documentation

## 2013-04-23 DIAGNOSIS — M1990 Unspecified osteoarthritis, unspecified site: Secondary | ICD-10-CM

## 2013-04-23 DIAGNOSIS — R079 Chest pain, unspecified: Secondary | ICD-10-CM

## 2013-04-23 DIAGNOSIS — R6889 Other general symptoms and signs: Secondary | ICD-10-CM

## 2013-04-23 DIAGNOSIS — M5481 Occipital neuralgia: Secondary | ICD-10-CM

## 2013-04-23 DIAGNOSIS — R112 Nausea with vomiting, unspecified: Secondary | ICD-10-CM

## 2013-04-23 DIAGNOSIS — F411 Generalized anxiety disorder: Secondary | ICD-10-CM

## 2013-04-23 DIAGNOSIS — R269 Unspecified abnormalities of gait and mobility: Secondary | ICD-10-CM

## 2013-04-23 DIAGNOSIS — R5381 Other malaise: Secondary | ICD-10-CM | POA: Insufficient documentation

## 2013-04-23 DIAGNOSIS — J9601 Acute respiratory failure with hypoxia: Secondary | ICD-10-CM

## 2013-04-23 DIAGNOSIS — G4700 Insomnia, unspecified: Secondary | ICD-10-CM

## 2013-04-23 DIAGNOSIS — F0391 Unspecified dementia with behavioral disturbance: Secondary | ICD-10-CM

## 2013-04-23 DIAGNOSIS — R1115 Cyclical vomiting syndrome unrelated to migraine: Principal | ICD-10-CM | POA: Insufficient documentation

## 2013-04-23 DIAGNOSIS — F03918 Unspecified dementia, unspecified severity, with other behavioral disturbance: Secondary | ICD-10-CM

## 2013-04-23 DIAGNOSIS — F0390 Unspecified dementia without behavioral disturbance: Secondary | ICD-10-CM

## 2013-04-23 DIAGNOSIS — E785 Hyperlipidemia, unspecified: Secondary | ICD-10-CM

## 2013-04-23 DIAGNOSIS — E538 Deficiency of other specified B group vitamins: Secondary | ICD-10-CM

## 2013-04-23 DIAGNOSIS — M25559 Pain in unspecified hip: Secondary | ICD-10-CM | POA: Insufficient documentation

## 2013-04-23 DIAGNOSIS — I2510 Atherosclerotic heart disease of native coronary artery without angina pectoris: Secondary | ICD-10-CM | POA: Insufficient documentation

## 2013-04-23 DIAGNOSIS — I4892 Unspecified atrial flutter: Secondary | ICD-10-CM

## 2013-04-23 DIAGNOSIS — I708 Atherosclerosis of other arteries: Secondary | ICD-10-CM | POA: Insufficient documentation

## 2013-04-23 DIAGNOSIS — F3289 Other specified depressive episodes: Secondary | ICD-10-CM

## 2013-04-23 DIAGNOSIS — I700 Atherosclerosis of aorta: Secondary | ICD-10-CM | POA: Insufficient documentation

## 2013-04-23 DIAGNOSIS — K449 Diaphragmatic hernia without obstruction or gangrene: Secondary | ICD-10-CM | POA: Insufficient documentation

## 2013-04-23 DIAGNOSIS — K5730 Diverticulosis of large intestine without perforation or abscess without bleeding: Secondary | ICD-10-CM | POA: Insufficient documentation

## 2013-04-23 DIAGNOSIS — R339 Retention of urine, unspecified: Secondary | ICD-10-CM

## 2013-04-23 DIAGNOSIS — E119 Type 2 diabetes mellitus without complications: Secondary | ICD-10-CM

## 2013-04-23 DIAGNOSIS — D72829 Elevated white blood cell count, unspecified: Secondary | ICD-10-CM

## 2013-04-23 DIAGNOSIS — I350 Nonrheumatic aortic (valve) stenosis: Secondary | ICD-10-CM

## 2013-04-23 DIAGNOSIS — R251 Tremor, unspecified: Secondary | ICD-10-CM

## 2013-04-23 DIAGNOSIS — I10 Essential (primary) hypertension: Secondary | ICD-10-CM

## 2013-04-23 HISTORY — DX: Cardiac arrhythmia, unspecified: I49.9

## 2013-04-23 LAB — COMPREHENSIVE METABOLIC PANEL
ALT: 11 U/L (ref 0–53)
AST: 15 U/L (ref 0–37)
Alkaline Phosphatase: 62 U/L (ref 39–117)
BUN: 22 mg/dL (ref 6–23)
CO2: 27 mEq/L (ref 19–32)
Calcium: 10.5 mg/dL (ref 8.4–10.5)
Chloride: 99 mEq/L (ref 96–112)
GFR calc Af Amer: 87 mL/min — ABNORMAL LOW (ref >90)
GFR calc non Af Amer: 75 mL/min — ABNORMAL LOW (ref >90)
Glucose, Bld: 194 mg/dL — ABNORMAL HIGH (ref 70–99)
Potassium: 3.6 mEq/L (ref 3.5–5.1)
Sodium: 139 mEq/L (ref 135–145)
Total Protein: 7.4 g/dL (ref 6.0–8.3)

## 2013-04-23 LAB — CBC
HCT: 37 % — ABNORMAL LOW (ref 39.0–52.0)
Hemoglobin: 12.9 g/dL — ABNORMAL LOW (ref 13.0–17.0)
MCH: 34.2 pg — ABNORMAL HIGH (ref 26.0–34.0)
Platelets: 245 10*3/uL (ref 150–400)
RBC: 3.77 MIL/uL — ABNORMAL LOW (ref 4.22–5.81)

## 2013-04-23 MED ORDER — SODIUM CHLORIDE 0.9 % IV SOLN
INTRAVENOUS | Status: DC
  Administered 2013-04-23: 23:00:00 via INTRAVENOUS

## 2013-04-23 MED ORDER — IOHEXOL 300 MG/ML  SOLN
50.0000 mL | Freq: Once | INTRAMUSCULAR | Status: AC | PRN
  Administered 2013-04-23: 50 mL via ORAL

## 2013-04-23 MED ORDER — ONDANSETRON HCL 4 MG/2ML IJ SOLN
4.0000 mg | Freq: Once | INTRAMUSCULAR | Status: AC
  Administered 2013-04-24: 4 mg via INTRAVENOUS
  Filled 2013-04-23: qty 2

## 2013-04-23 MED ORDER — ONDANSETRON HCL 4 MG/2ML IJ SOLN
4.0000 mg | Freq: Once | INTRAMUSCULAR | Status: AC
  Administered 2013-04-23: 4 mg via INTRAVENOUS
  Filled 2013-04-23: qty 2

## 2013-04-23 MED ORDER — IOHEXOL 300 MG/ML  SOLN
100.0000 mL | Freq: Once | INTRAMUSCULAR | Status: AC | PRN
  Administered 2013-04-23: 100 mL via INTRAVENOUS

## 2013-04-23 NOTE — ED Provider Notes (Signed)
CSN: [id number]     Arrival date & time 04/23/13  2044 History   First MD Initiated Contact with Patient 04/23/13 2216     Chief Complaint  Patient presents with  . Emesis   (Consider location/radiation/quality/duration/timing/severity/associated sxs/prior Treatment) HPI Patient brought to ED by wife for persist vomiting that began last night around midnight.  States he vomited for about 1.5 hours, went to sleep, woke up with left hip pain, relieved with voltaren and tramadol (prescribed yesterday).  Has since vomited everything he has tried to eat or drink.  Pt denies fever, chills, CP, SOB, cough, abdominal pain, diarrhea.  Thinks he may be constipated, last BM was two days ago.  Denies urinary symptoms.  Denies weakness or numbness of the extremities, lower extremity edema.  Per wife, patient has had progressive changes c/w dementia, recently admitted to [facility] for paranoia and depression.  While there he suffered a mechanical fall, was seen in ED immediately with negative workup, was also seen and discharged from our ED yesterday with negative head CT, CT cervical spine and lumbar spine.  Level V caveat for dementia.  Past Medical History  Diagnosis Date  . Carotid artery stenosis   . Gait disturbance   . Anxiety   . GERD (gastroesophageal reflux disease)   . Vitamin B12 deficiency   . Depression   . Osteoarthritis   . Diabetes mellitus, type 2   . Hypertension   . Vitamin D deficiency   . Tremor   . Aortic stenosis   . Hyperlipidemia   . Diverticulosis   . Adenomatous polyp of colon   . Memory loss   . Polyneuropathy in diabetes(357.2)   . Neuropathy   . Dementia    Past Surgical History  Procedure Laterality Date  . Vasectomy    . Lumbar laminectomy      L5 Dr Ophelia Charter  . Transurethral resection of prostate  March 2007  . Cataract extraction, bilateral Bilateral   . Tonsillectomy and adenoidectomy    . Left foot     Family History  Problem Relation Age of Onset   . Diabetes Father   . Colon cancer Brother   . Heart disease Brother   . Diabetes Brother   . Parkinson's disease Sister   . Arthritis Sister    History  Substance Use Topics  . Smoking status: Former Smoker    Types: Cigars  . Smokeless tobacco: Never Used  . Alcohol Use: No    Review of Systems  Unable to perform ROS: Dementia  Constitutional: Negative for fever and chills.  Respiratory: Negative for cough and shortness of breath.   Cardiovascular: Negative for chest pain.  Gastrointestinal: Positive for nausea and vomiting. Negative for abdominal pain and diarrhea.  Genitourinary: Negative for dysuria, urgency and frequency.  Musculoskeletal: Negative for back pain and neck pain.  Neurological: Negative for weakness and numbness.    Allergies  Citalopram hydrobromide and Duloxetine  Home Medications   Current Outpatient Rx  Name  Route  Sig  Dispense  Refill  . amLODipine (NORVASC) 10 MG tablet   Oral   Take 10 mg by mouth every morning.         Marland Kitchen aspirin EC 81 MG tablet   Oral   Take 81 mg by mouth at bedtime.          Marland Kitchen atenolol (TENORMIN) 25 MG tablet   Oral   Take 25 mg by mouth daily as needed (for high blood pressure).         Marland Kitchen  Cholecalciferol (VITAMIN D3) 1000 UNIT capsule   Oral   Take 1,000 Units by mouth every morning.          . clonazePAM (KLONOPIN) 0.5 MG tablet   Oral   Take 1 tablet (0.5 mg total) by mouth 3 (three) times daily as needed for anxiety. For anxiety.   90 tablet   3   . cyanocobalamin (,VITAMIN B-12,) 1000 MCG/ML injection   Intramuscular   Inject 1 mL (1,000 mcg total) into the muscle every 14 (fourteen) days.   10 mL   3   . diclofenac sodium (VOLTAREN) 1 % GEL   Topical   Apply 2 g topically 2 (two) times daily as needed (pain). For pain         . donepezil (ARICEPT) 5 MG tablet   Oral   Take 1 tablet (5 mg total) by mouth at bedtime.   90 tablet   2   . glipiZIDE (GLUCOTROL XL) 10 MG 24 hr tablet    Oral   Take 1 tablet (10 mg total) by mouth daily.   90 tablet   3   . metFORMIN (GLUCOPHAGE) 1000 MG tablet   Oral   Take 500-1,000 mg by mouth 2 (two) times daily with a meal. Take 1 tablet in the morning and 1/2 tablet in the evening         . Multiple Vitamins-Minerals (ICAPS PO)   Oral   Take 2 capsules by mouth every morning.          . potassium chloride (K-DUR) 10 MEQ tablet   Oral   Take 1 tablet (10 mEq total) by mouth 2 (two) times daily.   90 tablet   3   . pyridOXINE (VITAMIN B-6) 100 MG tablet   Oral   Take 100 mg by mouth every morning.         . traMADol (ULTRAM) 50 MG tablet   Oral   Take 1 tablet (50 mg total) by mouth every 6 (six) hours as needed for pain.   15 tablet   0   . valsartan-hydrochlorothiazide (DIOVAN-HCT) 320-12.5 MG per tablet   Oral   Take 1 tablet by mouth daily.   90 tablet   3   . vitamin C (ASCORBIC ACID) 500 MG tablet   Oral   Take 500 mg by mouth every morning.          . zolpidem (AMBIEN) 10 MG tablet   Oral   Take 10-15 mg by mouth at bedtime as needed for sleep.           BP 158/53  Pulse 55  Temp(Src) 97.9 F (36.6 C) (Oral)  Resp 16  Ht 5\' 3"  (1.6 m)  Wt 130 lb (58.968 kg)  BMI 23.03 kg/m2  SpO2 98% Physical Exam  Nursing note and vitals reviewed. Constitutional: He appears well-developed and well-nourished. No distress.  HENT:  Head: Normocephalic and atraumatic.  Neck: Neck supple.  Cardiovascular: Normal rate, regular rhythm and intact distal pulses.   Murmur heard. Pulmonary/Chest: Effort normal and breath sounds normal. No respiratory distress. He has no wheezes. He has no rales.  Abdominal: Soft. He exhibits no distension and no mass. There is tenderness in the left lower quadrant. There is no rebound and no guarding.  Musculoskeletal: He exhibits no edema.  Neurological: He is alert. He exhibits normal muscle tone.  Skin: He is not diaphoretic.    ED Course  Procedures (including  critical care time)  Labs Review Labs Reviewed  CBC - Abnormal; Notable for the following:    RBC 3.77 (*)    Hemoglobin 12.9 (*)    HCT 37.0 (*)    MCH 34.2 (*)    All other components within normal limits  COMPREHENSIVE METABOLIC PANEL - Abnormal; Notable for the following:    Glucose, Bld 194 (*)    GFR calc non Af Amer 75 (*)    GFR calc Af Amer 87 (*)    All other components within normal limits  LIPASE, BLOOD  URINALYSIS, ROUTINE W REFLEX MICROSCOPIC   Imaging Review Dg Hip Complete Left  04/23/2013   CLINICAL DATA:  Fall with left hip pain  EXAM: LEFT HIP - COMPLETE 2+ VIEW  COMPARISON:  10/05/2007 hip x-ray  FINDINGS: There is no evidence of hip fracture or dislocation. There is no evidence of inflammatory arthropathy or other focal bone abnormality. For age, degenerative hip narrowing bilaterally is mild. Extensive arterial calcification.  IMPRESSION: No evidence of acute osseous injury.   Electronically Signed   By: Tiburcio Pea M.D.   On: 04/23/2013 23:09   Ct Head Wo Contrast  04/22/2013   CLINICAL DATA:  Patient fell with bump to occiput and headache this past week  EXAM: CT HEAD WITHOUT CONTRAST  CT CERVICAL SPINE WITHOUT CONTRAST  TECHNIQUE: Multidetector CT imaging of the head and cervical spine was performed following the standard protocol without intravenous contrast. Multiplanar CT image reconstructions of the cervical spine were also generated.  COMPARISON:  Head CT 12/18/2012  FINDINGS: CT HEAD FINDINGS  Diffuse atrophy and low attenuation in the deep white matter. No change in this process. No vascular territory infarct, hemorrhage, or extra-axial fluid. Calvarium is intact.  CT CERVICAL SPINE FINDINGS  Normal alignment. No prevertebral soft tissue swelling. Multilevel degenerative disc disease. No fractures.  IMPRESSION: No acute intracranial abnormality. No acute traumatic injury involving the cervical spine.   Electronically Signed   By: Esperanza Heir M.D.   On:  04/22/2013 13:15   Ct Cervical Spine Wo Contrast  04/22/2013   CLINICAL DATA:  Patient fell with bump to occiput and headache this past week  EXAM: CT HEAD WITHOUT CONTRAST  CT CERVICAL SPINE WITHOUT CONTRAST  TECHNIQUE: Multidetector CT imaging of the head and cervical spine was performed following the standard protocol without intravenous contrast. Multiplanar CT image reconstructions of the cervical spine were also generated.  COMPARISON:  Head CT 12/18/2012  FINDINGS: CT HEAD FINDINGS  Diffuse atrophy and low attenuation in the deep white matter. No change in this process. No vascular territory infarct, hemorrhage, or extra-axial fluid. Calvarium is intact.  CT CERVICAL SPINE FINDINGS  Normal alignment. No prevertebral soft tissue swelling. Multilevel degenerative disc disease. No fractures.  IMPRESSION: No acute intracranial abnormality. No acute traumatic injury involving the cervical spine.   Electronically Signed   By: Esperanza Heir M.D.   On: 04/22/2013 13:15   Ct Lumbar Spine Wo Contrast  04/22/2013   CLINICAL DATA:  Recent fall, fell earlier this week, with lumbar spine pain  EXAM: CT LUMBAR SPINE WITHOUT CONTRAST  TECHNIQUE: Multidetector CT imaging of the lumbar spine was performed without intravenous contrast administration. Multiplanar CT image reconstructions were also generated.  COMPARISON:  MRI 06/19/2009, CT scan 04/06/2012  FINDINGS: There is mild convex left scoliosis of the lower lumbar spine. L5-S1, L4-5, and L3-4 show severe degenerative disc disease. There is moderate L2-3 and mild L1-2 degenerative disc disease. No fractures are identified. There is  evidence of prior lumbar spine surgery with left L5 posterior decompression via laminectomy. There is severe L5-S1 degenerative facet change. There is moderate L4-5 and L3-4 degenerative facet change. Milder degenerative facet changes seen throughout the remainder of the lumbar spine.  IMPRESSION: Significant chronic degenerative  change. No acute traumatic injury.   Electronically Signed   By: Esperanza Heir M.D.   On: 04/22/2013 14:22   Ct Abdomen Pelvis W Contrast  04/24/2013   CLINICAL DATA:  Back and leg pain. Previous lumbar surgery.  EXAM: CT ABDOMEN AND PELVIS WITH CONTRAST  TECHNIQUE: Multidetector CT imaging of the abdomen and pelvis was performed using the standard protocol following bolus administration of intravenous contrast.  CONTRAST:  50mL OMNIPAQUE IOHEXOL 300 MG/ML SOLN, OMNIPAQUE IOHEXOL 300 MG/ML SOLN  COMPARISON:  Previous day's exam and earlier studies  FINDINGS: Dependent atelectasis in the visualized lung bases. Extensive coronary calcifications. Extensive aortoiliac calcified plaque, involving visceral and renal branches. Unremarkable liver, gallbladder, spleen, adrenal glands, pancreas. Small bilateral renal cysts. No hydronephrosis. Small hiatal hernia. Stomach physiologically distended. Small bowel and colon nondilated. Innumerable descending and sigmoid diverticula. Urinary bladder physiologically distended. Moderate prostatic enlargement with central coarse calcifications. No ascites. No free air. No adenopathy localized. Multilevel spondylitic changes in the lower thoracic and lower lumbar spine.  IMPRESSION: 1. No acute abdominal process. 2. Sigmoid diverticulosis. 3. Hiatal hernia. 4. Atherosclerosis, including aortoiliac, renal, visceral, and coronary artery disease. Please note that although the presence of coronary artery calcium documents the presence of coronary artery disease, the severity of this disease and any potential stenosis cannot be assessed on this non-gated CT examination. Assessment for potential risk factor modification, dietary therapy or pharmacologic therapy may be warranted, if clinically indicated.   Electronically Signed   By: Oley Balm M.D.   On: 04/24/2013 00:16    EKG Interpretation   None      10:38 PM Discussed pt with Dr Freida Busman.  12:35 AM Pt with  persistent and profuse vomiting of CT PO contrast fluid.  CT without acute process.  Will admit to the hospital for persistent N/V.    12:56 AM Reviewed EKG with Dr Read Drivers.  New onset afib vs aflutter.  Depressions in V4, V5 are not new.     Date: 04/24/2013  Rate: 92  Rhythm: atrial flutter  QRS Axis: normal  Intervals: QT prolonged  ST/T Wave abnormalities: ST depressions laterally  Conduction Disutrbances:none  Narrative Interpretation:   Old EKG Reviewed: changes noted    MDM   1. Intractable nausea and vomiting   2. Atrial flutter     Pt with N/V that began overnight, persistent throughout day.  Pt no other current complaints - he did have left hip pain this morning from a recent fall but it was relieved with prescribed medications and he has been able to walk with his walker as normal. On exam, pt with tenderness to LLQ abdomen, CT abd/pelvis ordered, which is negative for acute process.  Pt continues to have profuse vomiting.  Unclear etiology.  CXR, troponin, EKG ordered.  EKG shows new onset afib vs aflutter. CXR negative. Troponin pending. Pt admitted to [facility] for further evaluation and treatment.  Discussed pt with Dr Toniann Fail who accepts the patient for admitted.      Trixie Dredge, PA-C 04/24/13 [phone]

## 2013-04-23 NOTE — ED Notes (Signed)
Pt seen in ED for pain in back and legs. Started with vomiting last pm. Today c/o HA, wife states increase in confusion. Pt alert to person and place.

## 2013-04-24 ENCOUNTER — Emergency Department (HOSPITAL_COMMUNITY): Payer: MEDICARE

## 2013-04-24 ENCOUNTER — Encounter (HOSPITAL_COMMUNITY): Payer: Self-pay | Admitting: Internal Medicine

## 2013-04-24 DIAGNOSIS — R1115 Cyclical vomiting syndrome unrelated to migraine: Principal | ICD-10-CM

## 2013-04-24 DIAGNOSIS — R112 Nausea with vomiting, unspecified: Secondary | ICD-10-CM | POA: Diagnosis present

## 2013-04-24 DIAGNOSIS — I4891 Unspecified atrial fibrillation: Secondary | ICD-10-CM

## 2013-04-24 DIAGNOSIS — F0390 Unspecified dementia without behavioral disturbance: Secondary | ICD-10-CM

## 2013-04-24 DIAGNOSIS — E119 Type 2 diabetes mellitus without complications: Secondary | ICD-10-CM

## 2013-04-24 LAB — CBC WITH DIFFERENTIAL/PLATELET
Basophils Absolute: 0 10*3/uL (ref 0.0–0.1)
Basophils Relative: 0 % (ref 0–1)
Eosinophils Absolute: 0 10*3/uL (ref 0.0–0.7)
Eosinophils Relative: 0 % (ref 0–5)
HCT: 32.7 % — ABNORMAL LOW (ref 39.0–52.0)
Hemoglobin: 11.2 g/dL — ABNORMAL LOW (ref 13.0–17.0)
Lymphocytes Relative: 33 % (ref 12–46)
Lymphs Abs: 2.2 10*3/uL (ref 0.7–4.0)
MCH: 33.8 pg (ref 26.0–34.0)
MCHC: 34.3 g/dL (ref 30.0–36.0)
MCV: 98.8 fL (ref 78.0–100.0)
Monocytes Absolute: 0.6 10*3/uL (ref 0.1–1.0)
Monocytes Relative: 9 % (ref 3–12)
Neutro Abs: 4 10*3/uL (ref 1.7–7.7)
RDW: 15.2 % (ref 11.5–15.5)

## 2013-04-24 LAB — GLUCOSE, CAPILLARY
Glucose-Capillary: 124 mg/dL — ABNORMAL HIGH (ref 70–99)
Glucose-Capillary: 116 mg/dL — ABNORMAL HIGH (ref 70–99)
Glucose-Capillary: 117 mg/dL — ABNORMAL HIGH (ref 70–99)

## 2013-04-24 LAB — COMPREHENSIVE METABOLIC PANEL
ALT: 10 U/L (ref 0–53)
Albumin: 3.2 g/dL — ABNORMAL LOW (ref 3.5–5.2)
Alkaline Phosphatase: 55 U/L (ref 39–117)
BUN: 21 mg/dL (ref 6–23)
Calcium: 9.7 mg/dL (ref 8.4–10.5)
Chloride: 104 mEq/L (ref 96–112)
Potassium: 3.5 mEq/L (ref 3.5–5.1)
Sodium: 141 mEq/L (ref 135–145)
Total Protein: 6.7 g/dL (ref 6.0–8.3)

## 2013-04-24 LAB — URINALYSIS, ROUTINE W REFLEX MICROSCOPIC
Bilirubin Urine: NEGATIVE
Ketones, ur: 15 mg/dL — AB
Nitrite: NEGATIVE
Specific Gravity, Urine: 1.035 — ABNORMAL HIGH (ref 1.005–1.030)
Urobilinogen, UA: 1 mg/dL (ref 0.0–1.0)
pH: 7.5 (ref 5.0–8.0)

## 2013-04-24 LAB — T3, FREE: T3, Free: 2.2 pg/mL — ABNORMAL LOW (ref 2.3–4.2)

## 2013-04-24 LAB — POCT I-STAT TROPONIN I: Troponin i, poc: 0.02 ng/mL (ref 0.00–0.08)

## 2013-04-24 MED ORDER — LORAZEPAM 2 MG/ML IJ SOLN
0.2500 mg | Freq: Three times a day (TID) | INTRAMUSCULAR | Status: DC | PRN
  Administered 2013-04-24: 0.25 mg via INTRAVENOUS
  Filled 2013-04-24: qty 1

## 2013-04-24 MED ORDER — ACETAMINOPHEN 650 MG RE SUPP: 650.0000 mg | Freq: Four times a day (QID) | RECTAL | Status: DC | PRN

## 2013-04-24 MED ORDER — INSULIN ASPART 100 UNIT/ML ~~LOC~~ SOLN: 0.0000 [IU] | Freq: Three times a day (TID) | SUBCUTANEOUS | Status: DC

## 2013-04-24 MED ORDER — ONDANSETRON 4 MG PO TBDP: 4.0000 mg | ORAL_TABLET | Freq: Three times a day (TID) | ORAL | Status: DC | PRN

## 2013-04-24 MED ORDER — LORAZEPAM BOLUS VIA INFUSION: 0.2500 mg | Freq: Three times a day (TID) | INTRAVENOUS | Status: DC | PRN

## 2013-04-24 MED ORDER — ACETAMINOPHEN 325 MG PO TABS: 650.0000 mg | ORAL_TABLET | Freq: Four times a day (QID) | ORAL | Status: DC | PRN

## 2013-04-24 MED ORDER — SODIUM CHLORIDE 0.9 % IV SOLN: INTRAVENOUS | Status: DC

## 2013-04-24 MED ORDER — SODIUM CHLORIDE 0.9 % IJ SOLN
3.0000 mL | Freq: Two times a day (BID) | INTRAMUSCULAR | Status: DC
  Administered 2013-04-24: 04:00:00 3 mL via INTRAVENOUS

## 2013-04-24 MED ORDER — SODIUM CHLORIDE 0.9 % IV SOLN
INTRAVENOUS | Status: DC
  Administered 2013-04-24: 04:00:00 via INTRAVENOUS

## 2013-04-24 MED ORDER — ONDANSETRON HCL 4 MG PO TABS: 4.0000 mg | ORAL_TABLET | Freq: Four times a day (QID) | ORAL | Status: DC | PRN

## 2013-04-24 MED ORDER — METOCLOPRAMIDE HCL 5 MG/ML IJ SOLN
10.0000 mg | Freq: Once | INTRAMUSCULAR | Status: AC
  Administered 2013-04-24: 10 mg via INTRAVENOUS
  Filled 2013-04-24: qty 2

## 2013-04-24 MED ORDER — METOPROLOL TARTRATE 1 MG/ML IV SOLN
2.5000 mg | Freq: Four times a day (QID) | INTRAVENOUS | Status: DC
  Administered 2013-04-24: 06:00:00 2.5 mg via INTRAVENOUS
  Filled 2013-04-24 (×2): qty 5

## 2013-04-24 MED ORDER — GLIPIZIDE ER 5 MG PO TB24: 5.0000 mg | ORAL_TABLET | Freq: Every day | ORAL | Status: DC

## 2013-04-24 MED ORDER — CHLORHEXIDINE GLUCONATE 0.12 % MT SOLN
15.0000 mL | Freq: Two times a day (BID) | OROMUCOSAL | Status: DC
  Filled 2013-04-24 (×4): qty 15

## 2013-04-24 MED ORDER — BIOTENE DRY MOUTH MT LIQD: 15.0000 mL | Freq: Two times a day (BID) | OROMUCOSAL | Status: DC

## 2013-04-24 MED ORDER — MORPHINE SULFATE 2 MG/ML IJ SOLN: 0.5000 mg | INTRAMUSCULAR | Status: DC | PRN

## 2013-04-24 MED ORDER — ENOXAPARIN SODIUM 40 MG/0.4ML ~~LOC~~ SOLN
40.0000 mg | SUBCUTANEOUS | Status: DC
  Filled 2013-04-24: qty 0.4

## 2013-04-24 MED ORDER — HYDRALAZINE HCL 20 MG/ML IJ SOLN: 10.0000 mg | INTRAMUSCULAR | Status: DC | PRN

## 2013-04-24 MED ORDER — ONDANSETRON HCL 4 MG/2ML IJ SOLN: 4.0000 mg | Freq: Four times a day (QID) | INTRAMUSCULAR | Status: DC | PRN

## 2013-04-24 MED ORDER — ONDANSETRON HCL 4 MG/2ML IJ SOLN: 4.0000 mg | Freq: Three times a day (TID) | INTRAMUSCULAR | Status: DC | PRN

## 2013-04-24 NOTE — Progress Notes (Signed)
[facility] PROGRESS NOTE  [name] ZOX:[id number] DOB: [birth year] DOA: 04/23/2013 PCP: Sonda Primes, MD  Assessment/Plan: 79 y.o. male history of dementia with paranoia who was just recently discharged from [facility] geriatrics psychiatric unit was brought to the ER after patient had persistent nausea vomiting from yesterday. Patient was at [facility] for a week. Patient has been having increasing paranoia   1. Nausea and vomiting - cause not clear ? Viral gastritis; resolved; no new symptoms, CT abd: No acute abdominal process; exam no s/s of actye abdomen;  -cont supportive care   2. PAF; resolved currently on NSR; d/w patient and his wife; due to gait instability, falls he felt to be not a good candide for [facility], cont ASA  3. Hypertension - cont home meds  4. Diabetes mellitus type 2 ; resume home meds; decrease glipizide   5. Dementia with paranoia - cont home regimen   6 generalized weakness;  Patient uses walker for the last year; no interested in PT/OT or rehab; or HHC   Code Status: full Family Communication: d/w his wife (indicate person spoken with, relationship, and if by phone, the number) Disposition Plan: pend PT eval    Consultants:  None   Procedures:  CT abd   Antibiotics:  None  (indicate start date, and stop date if known)  HPI/Subjective: Alert,   Objective: Filed Vitals:   04/24/13 0546  BP: 129/86  Pulse: 92  Temp: 98.2 F (36.8 C)  Resp: 18    Intake/Output Summary (Last 24 hours) at 04/24/13 1005 Last data filed at 04/24/13 0200  Gross per 24 hour  Intake      0 ml  Output      0 ml  Net      0 ml   Filed Weights   04/23/13 2048 04/24/13 0239 04/24/13 0546  Weight: 58.968 kg (130 lb) 57.108 kg (125 lb 14.4 oz) 57.017 kg (125 lb 11.2 oz)    Exam:   General:  alert  Cardiovascular: s1,s2 rrr  Respiratory: CTA BL   Abdomen: soft, nt, nd   Musculoskeletal: no edema    Data Reviewed: Basic  Metabolic Panel:  Recent Labs Lab 04/23/13 2150 04/24/13 0555  NA 139 141  K 3.6 3.5  CL 99 104  CO2 27 24  GLUCOSE 194* 131*  BUN 22 21  CREATININE 0.90 0.99  CALCIUM 10.5 9.7   Liver Function Tests:  Recent Labs Lab 04/23/13 2150 04/24/13 0555  AST 15 12  ALT 11 10  ALKPHOS 62 55  BILITOT 1.1 1.0  PROT 7.4 6.7  ALBUMIN 3.6 3.2*    Recent Labs Lab 04/23/13 2150  LIPASE 24   No results found for this basename: AMMONIA,  in the last 168 hours CBC:  Recent Labs Lab 04/23/13 2150 04/24/13 0555  WBC 5.4 6.9  NEUTROABS  --  4.0  HGB 12.9* 11.2*  HCT 37.0* 32.7*  MCV 98.1 98.8  PLT 245 214   Cardiac Enzymes:  Recent Labs Lab 04/24/13 0555  TROPONINI <0.30   BNP (last 3 results)  Recent Labs  12/18/12 1950  PROBNP 229.4   CBG:  Recent Labs Lab 04/24/13 0425 04/24/13 0759  GLUCAP 124* 116*    No results found for this or any previous visit (from the past 240 hour(s)).   Studies: Dg Chest 2 View  04/24/2013   CLINICAL DATA:  Abdominal pain.  Emesis.  EXAM: CHEST  2 VIEW  COMPARISON:  The 04/11/2013  FINDINGS: Chronic cardiomegaly. Convexity of the upper right mediastinum which is also stable over multiple years. No edema, asymmetric opacity, definite/significant effusion, or pneumothorax.  IMPRESSION: No active cardiopulmonary disease.   Electronically Signed   By: Tiburcio Pea M.D.   On: 04/24/2013 01:04   Dg Hip Complete Left  04/23/2013   CLINICAL DATA:  Fall with left hip pain  EXAM: LEFT HIP - COMPLETE 2+ VIEW  COMPARISON:  10/05/2007 hip x-ray  FINDINGS: There is no evidence of hip fracture or dislocation. There is no evidence of inflammatory arthropathy or other focal bone abnormality. For age, degenerative hip narrowing bilaterally is mild. Extensive arterial calcification.  IMPRESSION: No evidence of acute osseous injury.   Electronically Signed   By: Tiburcio Pea M.D.   On: 04/23/2013 23:09   Ct Head Wo Contrast  04/22/2013    CLINICAL DATA:  Patient fell with bump to occiput and headache this past week  EXAM: CT HEAD WITHOUT CONTRAST  CT CERVICAL SPINE WITHOUT CONTRAST  TECHNIQUE: Multidetector CT imaging of the head and cervical spine was performed following the standard protocol without intravenous contrast. Multiplanar CT image reconstructions of the cervical spine were also generated.  COMPARISON:  Head CT 12/18/2012  FINDINGS: CT HEAD FINDINGS  Diffuse atrophy and low attenuation in the deep white matter. No change in this process. No vascular territory infarct, hemorrhage, or extra-axial fluid. Calvarium is intact.  CT CERVICAL SPINE FINDINGS  Normal alignment. No prevertebral soft tissue swelling. Multilevel degenerative disc disease. No fractures.  IMPRESSION: No acute intracranial abnormality. No acute traumatic injury involving the cervical spine.   Electronically Signed   By: Esperanza Heir M.D.   On: 04/22/2013 13:15   Ct Cervical Spine Wo Contrast  04/22/2013   CLINICAL DATA:  Patient fell with bump to occiput and headache this past week  EXAM: CT HEAD WITHOUT CONTRAST  CT CERVICAL SPINE WITHOUT CONTRAST  TECHNIQUE: Multidetector CT imaging of the head and cervical spine was performed following the standard protocol without intravenous contrast. Multiplanar CT image reconstructions of the cervical spine were also generated.  COMPARISON:  Head CT 12/18/2012  FINDINGS: CT HEAD FINDINGS  Diffuse atrophy and low attenuation in the deep white matter. No change in this process. No vascular territory infarct, hemorrhage, or extra-axial fluid. Calvarium is intact.  CT CERVICAL SPINE FINDINGS  Normal alignment. No prevertebral soft tissue swelling. Multilevel degenerative disc disease. No fractures.  IMPRESSION: No acute intracranial abnormality. No acute traumatic injury involving the cervical spine.   Electronically Signed   By: Esperanza Heir M.D.   On: 04/22/2013 13:15   Ct Lumbar Spine Wo Contrast  04/22/2013    CLINICAL DATA:  Recent fall, fell earlier this week, with lumbar spine pain  EXAM: CT LUMBAR SPINE WITHOUT CONTRAST  TECHNIQUE: Multidetector CT imaging of the lumbar spine was performed without intravenous contrast administration. Multiplanar CT image reconstructions were also generated.  COMPARISON:  MRI 06/19/2009, CT scan 04/06/2012  FINDINGS: There is mild convex left scoliosis of the lower lumbar spine. L5-S1, L4-5, and L3-4 show severe degenerative disc disease. There is moderate L2-3 and mild L1-2 degenerative disc disease. No fractures are identified. There is evidence of prior lumbar spine surgery with left L5 posterior decompression via laminectomy. There is severe L5-S1 degenerative facet change. There is moderate L4-5 and L3-4 degenerative facet change. Milder degenerative facet changes seen throughout the remainder of the lumbar spine.  IMPRESSION: Significant chronic degenerative change. No acute traumatic injury.   Electronically  Signed   By: Esperanza Heir M.D.   On: 04/22/2013 14:22   Ct Abdomen Pelvis W Contrast  04/24/2013   CLINICAL DATA:  Back and leg pain. Previous lumbar surgery.  EXAM: CT ABDOMEN AND PELVIS WITH CONTRAST  TECHNIQUE: Multidetector CT imaging of the abdomen and pelvis was performed using the standard protocol following bolus administration of intravenous contrast.  CONTRAST:  50mL OMNIPAQUE IOHEXOL 300 MG/ML SOLN, OMNIPAQUE IOHEXOL 300 MG/ML SOLN  COMPARISON:  Previous day's exam and earlier studies  FINDINGS: Dependent atelectasis in the visualized lung bases. Extensive coronary calcifications. Extensive aortoiliac calcified plaque, involving visceral and renal branches. Unremarkable liver, gallbladder, spleen, adrenal glands, pancreas. Small bilateral renal cysts. No hydronephrosis. Small hiatal hernia. Stomach physiologically distended. Small bowel and colon nondilated. Innumerable descending and sigmoid diverticula. Urinary bladder physiologically distended.  Moderate prostatic enlargement with central coarse calcifications. No ascites. No free air. No adenopathy localized. Multilevel spondylitic changes in the lower thoracic and lower lumbar spine.  IMPRESSION: 1. No acute abdominal process. 2. Sigmoid diverticulosis. 3. Hiatal hernia. 4. Atherosclerosis, including aortoiliac, renal, visceral, and coronary artery disease. Please note that although the presence of coronary artery calcium documents the presence of coronary artery disease, the severity of this disease and any potential stenosis cannot be assessed on this non-gated CT examination. Assessment for potential risk factor modification, dietary therapy or pharmacologic therapy may be warranted, if clinically indicated.   Electronically Signed   By: Oley Balm M.D.   On: 04/24/2013 00:16    Scheduled Meds: . antiseptic oral rinse  15 mL Mouth Rinse q12n4p  . chlorhexidine  15 mL Mouth Rinse BID  . enoxaparin (LOVENOX) injection  40 mg Subcutaneous Q24H  . insulin aspart  0-9 Units Subcutaneous TID WC  . metoprolol  2.5 mg Intravenous Q6H  . sodium chloride  3 mL Intravenous Q12H   Continuous Infusions: . sodium chloride 75 mL/hr at 04/24/13 0335    Principal Problem:   Nausea & vomiting Active Problems:   HYPERTENSION   Dementia with behavioral disturbance   Diabetes   Atrial fibrillation    Time spent: >35 minutes     Esperanza Sheets  [facility] Pager [phone]. If 7PM-7AM, please contact night-coverage at [email or web address], password [facility] 04/24/2013, 10:05 AM  LOS: 1 day

## 2013-04-24 NOTE — H&P (Signed)
[facility] History and Physical  SHAKEEL DISNEY WUX:[id number] DOB: 1925-01-14 DOA: 04/23/2013  Referring physician: ER physician. PCP: Sonda Primes, MD   Chief Complaint: Nausea vomiting.  HPI: [name] is a 79 y.o. male history of dementia with paranoia who was just recently discharged from [facility] geriatrics psychiatric unit was brought to the ER after patient had persistent nausea vomiting from yesterday. Patient was at [facility] for a week. Patient has been having increasing paranoia and was admitted to [facility]. At [facility] as per the patient's wife no new medication changes were made as patient's wife wanted to discuss with patient's PCP before changes were made. Patient had a fall at the hospital as per the patient's wife and has had a CT head and C-spine done 2 days ago which was unremarkable for anything acute. Patient was brought to the ER last evening because of increasing nausea and vomiting. Patient has not had any diarrhea but did not complain of abdominal pain. Patient has been having left hip pain for which x-rays were done back showing nothing acute. The last time patient had bowel movement was 2 days ago. CT abdomen and pelvis did not show anything acute. Patient had another episode of vomiting in the ER after contrast was given. In addition patient's EKG shows new onset of atrial fibrillation. Patient will be admitted for further management.  Review of Systems: As presented in the history of presenting illness, rest negative.  Past Medical History  Diagnosis Date  . Carotid artery stenosis   . Gait disturbance   . Anxiety   . GERD (gastroesophageal reflux disease)   . Vitamin B12 deficiency   . Depression   . Osteoarthritis   . Diabetes mellitus, type 2   . Hypertension   . Vitamin D deficiency   . Tremor   . Aortic stenosis   . Hyperlipidemia   . Diverticulosis   . Adenomatous polyp of colon   . Memory loss    . Polyneuropathy in diabetes(357.2)   . Neuropathy   . Dementia    Past Surgical History  Procedure Laterality Date  . Vasectomy    . Lumbar laminectomy      L5 Dr Ophelia Charter  . Transurethral resection of prostate  March 2007  . Cataract extraction, bilateral Bilateral   . Tonsillectomy and adenoidectomy    . Left foot     Social History:  reports that he has quit smoking. His smoking use included Cigars. He has never used smokeless tobacco. He reports that he does not drink alcohol or use illicit drugs. Where does patient live home. Can patient participate in ADLs? No.  Allergies  Allergen Reactions  . Citalopram Hydrobromide Other (See Comments)    no reaction, refused to take  . Duloxetine Other (See Comments)    did not like, refused to continue    Family History:  Family History  Problem Relation Age of Onset  . Diabetes Father   . Colon cancer Brother   . Heart disease Brother   . Diabetes Brother   . Parkinson's disease Sister   . Arthritis Sister       Prior to Admission medications   Medication Sig Start Date End Date Taking? Authorizing Provider  amLODipine (NORVASC) 10 MG tablet Take 10 mg by mouth every morning.   Yes Historical Provider, MD  aspirin EC 81 MG tablet Take 81 mg by mouth at bedtime.    Yes Historical Provider, MD  atenolol (TENORMIN) 25  MG tablet Take 25 mg by mouth daily as needed (for high blood pressure).   Yes Historical Provider, MD  Cholecalciferol (VITAMIN D3) 1000 UNIT capsule Take 1,000 Units by mouth every morning.    Yes Historical Provider, MD  clonazePAM (KLONOPIN) 0.5 MG tablet Take 1 tablet (0.5 mg total) by mouth 3 (three) times daily as needed for anxiety. For anxiety. 03/14/13  Yes Georgina Quint Plotnikov, MD  cyanocobalamin (,VITAMIN B-12,) 1000 MCG/ML injection Inject 1 mL (1,000 mcg total) into the muscle every 14 (fourteen) days. 03/14/13  Yes Georgina Quint Plotnikov, MD  diclofenac sodium (VOLTAREN) 1 % GEL Apply 2 g topically 2 (two)  times daily as needed (pain). For pain   Yes Historical Provider, MD  donepezil (ARICEPT) 5 MG tablet Take 1 tablet (5 mg total) by mouth at bedtime. 03/14/13  Yes Georgina Quint Plotnikov, MD  glipiZIDE (GLUCOTROL XL) 10 MG 24 hr tablet Take 1 tablet (10 mg total) by mouth daily. 12/09/12  Yes Tresa Garter, MD  metFORMIN (GLUCOPHAGE) 1000 MG tablet Take 500-1,000 mg by mouth 2 (two) times daily with a meal. Take 1 tablet in the morning and 1/2 tablet in the evening   Yes Historical Provider, MD  Multiple Vitamins-Minerals (ICAPS PO) Take 2 capsules by mouth every morning.    Yes Historical Provider, MD  potassium chloride (K-DUR) 10 MEQ tablet Take 1 tablet (10 mEq total) by mouth 2 (two) times daily. 03/14/13  Yes Georgina Quint Plotnikov, MD  pyridOXINE (VITAMIN B-6) 100 MG tablet Take 100 mg by mouth every morning.   Yes Historical Provider, MD  traMADol (ULTRAM) 50 MG tablet Take 1 tablet (50 mg total) by mouth every 6 (six) hours as needed for pain. 04/22/13  Yes Geoffery Lyons, MD  valsartan-hydrochlorothiazide (DIOVAN-HCT) 320-12.5 MG per tablet Take 1 tablet by mouth daily. 12/09/12  Yes Georgina Quint Plotnikov, MD  vitamin C (ASCORBIC ACID) 500 MG tablet Take 500 mg by mouth every morning.    Yes Historical Provider, MD  zolpidem (AMBIEN) 10 MG tablet Take 10-15 mg by mouth at bedtime as needed for sleep.    Yes Historical Provider, MD    Physical Exam: Filed Vitals:   04/23/13 2048 04/24/13 0055  BP: 158/53 128/61  Pulse: 55 85  Temp: 97.9 F (36.6 C)   TempSrc: Oral   Resp: 16 20  Height: 5\' 3"  (1.6 m)   Weight: 58.968 kg (130 lb)   SpO2: 98% 98%     General:  Well-developed well-nourished.  Eyes: Anicteric no pallor.  ENT: No discharge from ears eyes nose mouth.  Neck: No mass felt.  Cardiovascular: S1-S2 heard.  Respiratory: No rhonchi or crepitations.  Abdomen: Soft nontender bowel sounds present.  Skin: No rash.  Musculoskeletal: No edema. Patient at this time does not  have pain on moving his extremities.  Psychiatric: Patient presently is not agitated.  Neurologic: Alert awake oriented to his name and place. Moves all extremities.  Labs on Admission:  Basic Metabolic Panel:  Recent Labs Lab 04/23/13 2150  NA 139  K 3.6  CL 99  CO2 27  GLUCOSE 194*  BUN 22  CREATININE 0.90  CALCIUM 10.5   Liver Function Tests:  Recent Labs Lab 04/23/13 2150  AST 15  ALT 11  ALKPHOS 62  BILITOT 1.1  PROT 7.4  ALBUMIN 3.6    Recent Labs Lab 04/23/13 2150  LIPASE 24   No results found for this basename: AMMONIA,  in the last 168  hours CBC:  Recent Labs Lab 04/23/13 2150  WBC 5.4  HGB 12.9*  HCT 37.0*  MCV 98.1  PLT 245   Cardiac Enzymes: No results found for this basename: CKTOTAL, CKMB, CKMBINDEX, TROPONINI,  in the last 168 hours  BNP (last 3 results)  Recent Labs  12/18/12 1950  PROBNP 229.4   CBG: No results found for this basename: GLUCAP,  in the last 168 hours  Radiological Exams on Admission: Dg Chest 2 View  04/24/2013   CLINICAL DATA:  Abdominal pain.  Emesis.  EXAM: CHEST  2 VIEW  COMPARISON:  The 04/11/2013  FINDINGS: Chronic cardiomegaly. Convexity of the upper right mediastinum which is also stable over multiple years. No edema, asymmetric opacity, definite/significant effusion, or pneumothorax.  IMPRESSION: No active cardiopulmonary disease.   Electronically Signed   By: Tiburcio Pea M.D.   On: 04/24/2013 01:04   Dg Hip Complete Left  04/23/2013   CLINICAL DATA:  Fall with left hip pain  EXAM: LEFT HIP - COMPLETE 2+ VIEW  COMPARISON:  10/05/2007 hip x-ray  FINDINGS: There is no evidence of hip fracture or dislocation. There is no evidence of inflammatory arthropathy or other focal bone abnormality. For age, degenerative hip narrowing bilaterally is mild. Extensive arterial calcification.  IMPRESSION: No evidence of acute osseous injury.   Electronically Signed   By: Tiburcio Pea M.D.   On: 04/23/2013 23:09    Ct Head Wo Contrast  04/22/2013   CLINICAL DATA:  Patient fell with bump to occiput and headache this past week  EXAM: CT HEAD WITHOUT CONTRAST  CT CERVICAL SPINE WITHOUT CONTRAST  TECHNIQUE: Multidetector CT imaging of the head and cervical spine was performed following the standard protocol without intravenous contrast. Multiplanar CT image reconstructions of the cervical spine were also generated.  COMPARISON:  Head CT 12/18/2012  FINDINGS: CT HEAD FINDINGS  Diffuse atrophy and low attenuation in the deep white matter. No change in this process. No vascular territory infarct, hemorrhage, or extra-axial fluid. Calvarium is intact.  CT CERVICAL SPINE FINDINGS  Normal alignment. No prevertebral soft tissue swelling. Multilevel degenerative disc disease. No fractures.  IMPRESSION: No acute intracranial abnormality. No acute traumatic injury involving the cervical spine.   Electronically Signed   By: Esperanza Heir M.D.   On: 04/22/2013 13:15   Ct Cervical Spine Wo Contrast  04/22/2013   CLINICAL DATA:  Patient fell with bump to occiput and headache this past week  EXAM: CT HEAD WITHOUT CONTRAST  CT CERVICAL SPINE WITHOUT CONTRAST  TECHNIQUE: Multidetector CT imaging of the head and cervical spine was performed following the standard protocol without intravenous contrast. Multiplanar CT image reconstructions of the cervical spine were also generated.  COMPARISON:  Head CT 12/18/2012  FINDINGS: CT HEAD FINDINGS  Diffuse atrophy and low attenuation in the deep white matter. No change in this process. No vascular territory infarct, hemorrhage, or extra-axial fluid. Calvarium is intact.  CT CERVICAL SPINE FINDINGS  Normal alignment. No prevertebral soft tissue swelling. Multilevel degenerative disc disease. No fractures.  IMPRESSION: No acute intracranial abnormality. No acute traumatic injury involving the cervical spine.   Electronically Signed   By: Esperanza Heir M.D.   On: 04/22/2013 13:15   Ct Lumbar  Spine Wo Contrast  04/22/2013   CLINICAL DATA:  Recent fall, fell earlier this week, with lumbar spine pain  EXAM: CT LUMBAR SPINE WITHOUT CONTRAST  TECHNIQUE: Multidetector CT imaging of the lumbar spine was performed without intravenous contrast administration. Multiplanar CT  image reconstructions were also generated.  COMPARISON:  MRI 06/19/2009, CT scan 04/06/2012  FINDINGS: There is mild convex left scoliosis of the lower lumbar spine. L5-S1, L4-5, and L3-4 show severe degenerative disc disease. There is moderate L2-3 and mild L1-2 degenerative disc disease. No fractures are identified. There is evidence of prior lumbar spine surgery with left L5 posterior decompression via laminectomy. There is severe L5-S1 degenerative facet change. There is moderate L4-5 and L3-4 degenerative facet change. Milder degenerative facet changes seen throughout the remainder of the lumbar spine.  IMPRESSION: Significant chronic degenerative change. No acute traumatic injury.   Electronically Signed   By: Esperanza Heir M.D.   On: 04/22/2013 14:22   Ct Abdomen Pelvis W Contrast  04/24/2013   CLINICAL DATA:  Back and leg pain. Previous lumbar surgery.  EXAM: CT ABDOMEN AND PELVIS WITH CONTRAST  TECHNIQUE: Multidetector CT imaging of the abdomen and pelvis was performed using the standard protocol following bolus administration of intravenous contrast.  CONTRAST:  50mL OMNIPAQUE IOHEXOL 300 MG/ML SOLN, OMNIPAQUE IOHEXOL 300 MG/ML SOLN  COMPARISON:  Previous day's exam and earlier studies  FINDINGS: Dependent atelectasis in the visualized lung bases. Extensive coronary calcifications. Extensive aortoiliac calcified plaque, involving visceral and renal branches. Unremarkable liver, gallbladder, spleen, adrenal glands, pancreas. Small bilateral renal cysts. No hydronephrosis. Small hiatal hernia. Stomach physiologically distended. Small bowel and colon nondilated. Innumerable descending and sigmoid diverticula. Urinary  bladder physiologically distended. Moderate prostatic enlargement with central coarse calcifications. No ascites. No free air. No adenopathy localized. Multilevel spondylitic changes in the lower thoracic and lower lumbar spine.  IMPRESSION: 1. No acute abdominal process. 2. Sigmoid diverticulosis. 3. Hiatal hernia. 4. Atherosclerosis, including aortoiliac, renal, visceral, and coronary artery disease. Please note that although the presence of coronary artery calcium documents the presence of coronary artery disease, the severity of this disease and any potential stenosis cannot be assessed on this non-gated CT examination. Assessment for potential risk factor modification, dietary therapy or pharmacologic therapy may be warranted, if clinically indicated.   Electronically Signed   By: Oley Balm M.D.   On: 04/24/2013 00:16    EKG: Independently reviewed. Atrial fibrillation with controlled rate.  Assessment/Plan Principal Problem:   Nausea & vomiting Active Problems:   HYPERTENSION   Dementia with behavioral disturbance   Diabetes   Atrial fibrillation   1. Nausea and vomiting - cause not clear. At this time I have ordered a sonogram of the abdomen to check for gallstones. I have kept patient n.p.o. for now. Gentle hydration and pain relief medications. 2. Atrial fibrillation new onset rate controlled presently - patient is initially on Atenolol. Since patient is n.p.o. and place patient on scheduled dose of metoprolol 2.5 mg IV every 6 hourly. Patient has CHADS2 score more than 2 and will be a candidate for anticoagulation which has to be considered later after the acute episode of nausea and vomiting and also consider that patient has gait issues and is a risk for falls. 3. Hypertension - since patient is n.p.o. I have placed patient on when necessary IV hydralazine for systolic blood pressure more than 160. Metoprolol 2.5 mg IV every 6 hourly for A. Fib. 4. Diabetes mellitus type 2 -  closely follow CBGs with sliding-scale coverage. Metformin on hold as patient has had a CAT scan with contrast. 5. Dementia with paranoia - for now since patient is n.p.o. when necessary Ativan 0.25 mg IV q. 8 hourly has been ordered. 6. Hyperlipidemia - continue statins  when patient can take orally.   Code Status:  Full code.  Family Communication:  Patient's wife at the bedside, who is also patient's health care power of attorney.  Disposition Plan:  Admit to inpatient.    Ilya Ess N. [facility] Pager [phone].  If 7PM-7AM, please contact night-coverage [email or web address] Password [email or web address] 04/24/2013, 2:27 AM

## 2013-04-24 NOTE — Discharge Summary (Signed)
Physician Discharge Summary  [name] RUE:[id number] DOB: [birth year] DOA: 04/23/2013  PCP: Sonda Primes, MD  Admit date: 04/23/2013 Discharge date: 04/24/2013  Time spent: >35 minutes  Recommendations for Outpatient Follow-up:  F/uwith PCP in 1-2 weeks as needed   Discharge Diagnoses:  Principal Problem:   Nausea & vomiting Active Problems:   HYPERTENSION   Dementia with behavioral disturbance   Diabetes   Atrial fibrillation   Discharge Condition: stable   Diet recommendation: heart healthy   Filed Weights   04/23/13 2048 04/24/13 0239 04/24/13 0546  Weight: 58.968 kg (130 lb) 57.108 kg (125 lb 14.4 oz) 57.017 kg (125 lb 11.2 oz)    History of present illness:  79 y.o. male history of dementia with paranoia who was just recently discharged from [facility] ger[facility] unit was brought to the ER after patient had persistent nausea vomiting from yesterday. Patient was at [facility] for a week. Patient has been having increasing paranoia   Hospital Course:  1. Nausea and vomiting - cause not clear ? Viral gastritis; resolved; no new symptoms, CT abd: No acute abdominal process; exam no s/s of actye abdomen;  -resolved ; cont supportive care   2. PAF; resolved currently on NSR; d/w patient and his wife; due to gait instability, falls he felt to be not a good candide for [facility], cont ASA, BB  3. Hypertension - cont home meds  4. Diabetes mellitus type 2 ; HA1C WNL; decrease glipizide; f/u with PCP  5. Dementia with paranoia - stable; cont home regimen  6. generalized weakness;  Patient uses walker for the last year; no interested in PT/OT or rehab; or HHC      Procedures: CT abd Consultations:  None   Discharge Exam: Filed Vitals:   04/24/13 0546  BP: 129/86  Pulse: 92  Temp: 98.2 F (36.8 C)  Resp: 18    General: alert Cardiovascular: s1,s2 rrr Respiratory: CTA BL   Discharge Instructions  Discharge Orders   Future  Appointments Provider Department Dept Phone   06/20/2013 2:30 PM Tresa Garter, MD [facility] [phone]   09/25/2013 3:30 PM York Spaniel, MD [facility] [phone]   Future Orders Complete By Expires   Diet - low sodium heart healthy  As directed    Discharge instructions  As directed    Comments:     Follow up with primary care doctor in 1-2 weeks   Increase activity slowly  As directed        Medication List         amLODipine 10 MG tablet  Commonly known as:  NORVASC  Take 10 mg by mouth every morning.     aspirin EC 81 MG tablet  Take 81 mg by mouth at bedtime.     atenolol 25 MG tablet  Commonly known as:  TENORMIN  Take 25 mg by mouth daily as needed (for high blood pressure).     Cholecalciferol 1000 UNITS capsule  Take 1,000 Units by mouth every morning.     clonazePAM 0.5 MG tablet  Commonly known as:  KLONOPIN  Take 1 tablet (0.5 mg total) by mouth 3 (three) times daily as needed for anxiety. For anxiety.     cyanocobalamin 1000 MCG/ML injection  Commonly known as:  (VITAMIN B-12)  Inject 1 mL (1,000 mcg total) into the muscle every 14 (fourteen) days.     diclofenac sodium 1 % Gel  Commonly known as:  VOLTAREN  Apply 2 g topically 2 (two) times daily as needed (pain). For pain     donepezil 5 MG tablet  Commonly known as:  ARICEPT  Take 1 tablet (5 mg total) by mouth at bedtime.     glipiZIDE 5 MG 24 hr tablet  Commonly known as:  GLIPIZIDE XL  Take 1 tablet (5 mg total) by mouth daily.     ICAPS PO  Take 2 capsules by mouth every morning.     metFORMIN 1000 MG tablet  Commonly known as:  GLUCOPHAGE  Take 500-1,000 mg by mouth 2 (two) times daily with a meal. Take 1 tablet in the morning and 1/2 tablet in the evening     ondansetron 4 MG disintegrating tablet  Commonly known as:  ZOFRAN ODT  Take 1 tablet (4 mg total) by mouth every 8 (eight) hours as needed for nausea.     potassium  chloride 10 MEQ tablet  Commonly known as:  K-DUR  Take 1 tablet (10 mEq total) by mouth 2 (two) times daily.     pyridOXINE 100 MG tablet  Commonly known as:  VITAMIN B-6  Take 100 mg by mouth every morning.     traMADol 50 MG tablet  Commonly known as:  ULTRAM  Take 1 tablet (50 mg total) by mouth every 6 (six) hours as needed for pain.     valsartan-hydrochlorothiazide 320-12.5 MG per tablet  Commonly known as:  DIOVAN-HCT  Take 1 tablet by mouth daily.     vitamin C 500 MG tablet  Commonly known as:  ASCORBIC ACID  Take 500 mg by mouth every morning.     zolpidem 10 MG tablet  Commonly known as:  AMBIEN  Take 10-15 mg by mouth at bedtime as needed for sleep.       Allergies  Allergen Reactions  . Citalopram Hydrobromide Other (See Comments)    no reaction, refused to take  . Duloxetine Other (See Comments)    did not like, refused to continue       Follow-up Information   Follow up with Sonda Primes, MD In 1 week.   Specialty:  Internal Medicine   Contact information:   [address] [place] [address] [phone]        The results of significant diagnostics from this hospitalization (including imaging, microbiology, ancillary and laboratory) are listed below for reference.    Significant Diagnostic Studies: Dg Chest 2 View  04/24/2013   CLINICAL DATA:  Abdominal pain.  Emesis.  EXAM: CHEST  2 VIEW  COMPARISON:  The 04/11/2013  FINDINGS: Chronic cardiomegaly. Convexity of the upper right mediastinum which is also stable over multiple years. No edema, asymmetric opacity, definite/significant effusion, or pneumothorax.  IMPRESSION: No active cardiopulmonary disease.   Electronically Signed   By: Tiburcio Pea M.D.   On: 04/24/2013 01:04   Dg Hip Complete Left  04/23/2013   CLINICAL DATA:  Fall with left hip pain  EXAM: LEFT HIP - COMPLETE 2+ VIEW  COMPARISON:  10/05/2007 hip x-ray  FINDINGS: There is no evidence of hip  fracture or dislocation. There is no evidence of inflammatory arthropathy or other focal bone abnormality. For age, degenerative hip narrowing bilaterally is mild. Extensive arterial calcification.  IMPRESSION: No evidence of acute osseous injury.   Electronically Signed   By: Tiburcio Pea M.D.   On: 04/23/2013 23:09   Ct Head Wo Contrast  04/22/2013   CLINICAL DATA:  Patient fell with bump to occiput and headache this past week  EXAM: CT HEAD WITHOUT CONTRAST  CT CERVICAL SPINE WITHOUT CONTRAST  TECHNIQUE: Multidetector CT imaging of the head and cervical spine was performed following the standard protocol without intravenous contrast. Multiplanar CT image reconstructions of the cervical spine were also generated.  COMPARISON:  Head CT 12/18/2012  FINDINGS: CT HEAD FINDINGS  Diffuse atrophy and low attenuation in the deep white matter. No change in this process. No vascular territory infarct, hemorrhage, or extra-axial fluid. Calvarium is intact.  CT CERVICAL SPINE FINDINGS  Normal alignment. No prevertebral soft tissue swelling. Multilevel degenerative disc disease. No fractures.  IMPRESSION: No acute intracranial abnormality. No acute traumatic injury involving the cervical spine.   Electronically Signed   By: Esperanza Heir M.D.   On: 04/22/2013 13:15   Ct Cervical Spine Wo Contrast  04/22/2013   CLINICAL DATA:  Patient fell with bump to occiput and headache this past week  EXAM: CT HEAD WITHOUT CONTRAST  CT CERVICAL SPINE WITHOUT CONTRAST  TECHNIQUE: Multidetector CT imaging of the head and cervical spine was performed following the standard protocol without intravenous contrast. Multiplanar CT image reconstructions of the cervical spine were also generated.  COMPARISON:  Head CT 12/18/2012  FINDINGS: CT HEAD FINDINGS  Diffuse atrophy and low attenuation in the deep white matter. No change in this process. No vascular territory infarct, hemorrhage, or extra-axial fluid. Calvarium is intact.  CT  CERVICAL SPINE FINDINGS  Normal alignment. No prevertebral soft tissue swelling. Multilevel degenerative disc disease. No fractures.  IMPRESSION: No acute intracranial abnormality. No acute traumatic injury involving the cervical spine.   Electronically Signed   By: Esperanza Heir M.D.   On: 04/22/2013 13:15   Ct Lumbar Spine Wo Contrast  04/22/2013   CLINICAL DATA:  Recent fall, fell earlier this week, with lumbar spine pain  EXAM: CT LUMBAR SPINE WITHOUT CONTRAST  TECHNIQUE: Multidetector CT imaging of the lumbar spine was performed without intravenous contrast administration. Multiplanar CT image reconstructions were also generated.  COMPARISON:  MRI 06/19/2009, CT scan 04/06/2012  FINDINGS: There is mild convex left scoliosis of the lower lumbar spine. L5-S1, L4-5, and L3-4 show severe degenerative disc disease. There is moderate L2-3 and mild L1-2 degenerative disc disease. No fractures are identified. There is evidence of prior lumbar spine surgery with left L5 posterior decompression via laminectomy. There is severe L5-S1 degenerative facet change. There is moderate L4-5 and L3-4 degenerative facet change. Milder degenerative facet changes seen throughout the remainder of the lumbar spine.  IMPRESSION: Significant chronic degenerative change. No acute traumatic injury.   Electronically Signed   By: Esperanza Heir M.D.   On: 04/22/2013 14:22   Ct Abdomen Pelvis W Contrast  04/24/2013   CLINICAL DATA:  Back and leg pain. Previous lumbar surgery.  EXAM: CT ABDOMEN AND PELVIS WITH CONTRAST  TECHNIQUE: Multidetector CT imaging of the abdomen and pelvis was performed using the standard protocol following bolus administration of intravenous contrast.  CONTRAST:  50mL OMNIPAQUE IOHEXOL 300 MG/ML SOLN, OMNIPAQUE IOHEXOL 300 MG/ML SOLN  COMPARISON:  Previous day's exam and earlier studies  FINDINGS: Dependent atelectasis in the visualized lung bases. Extensive coronary calcifications. Extensive  aortoiliac calcified plaque, involving visceral and renal branches. Unremarkable liver, gallbladder, spleen, adrenal glands, pancreas. Small bilateral renal cysts. No hydronephrosis. Small hiatal hernia. Stomach physiologically distended. Small bowel and colon nondilated. Innumerable descending and sigmoid diverticula. Urinary bladder physiologically distended. Moderate prostatic enlargement with central coarse calcifications.  No ascites. No free air. No adenopathy localized. Multilevel spondylitic changes in the lower thoracic and lower lumbar spine.  IMPRESSION: 1. No acute abdominal process. 2. Sigmoid diverticulosis. 3. Hiatal hernia. 4. Atherosclerosis, including aortoiliac, renal, visceral, and coronary artery disease. Please note that although the presence of coronary artery calcium documents the presence of coronary artery disease, the severity of this disease and any potential stenosis cannot be assessed on this non-gated CT examination. Assessment for potential risk factor modification, dietary therapy or pharmacologic therapy may be warranted, if clinically indicated.   Electronically Signed   By: Oley Balm M.D.   On: 04/24/2013 00:16   Dg Chest Port 1 View  04/11/2013   CLINICAL DATA:  Hypertensive diabetic with altered mental status.  EXAM: PORTABLE CHEST - 1 VIEW  COMPARISON:  The 12/18/2012.  FINDINGS: Central pulmonary vascular prominence without frank pulmonary edema. No segmental infiltrate or gross pneumothorax.  Heart size within normal limits.  Calcified aorta.  IMPRESSION: No acute abnormality. Please see above.   Electronically Signed   By: Bridgett Larsson M.D.   On: 04/11/2013 09:16    Microbiology: No results found for this or any previous visit (from the past 240 hour(s)).   Labs: Basic Metabolic Panel:  Recent Labs Lab 04/23/13 2150 04/24/13 0555  NA 139 141  K 3.6 3.5  CL 99 104  CO2 27 24  GLUCOSE 194* 131*  BUN 22 21  CREATININE 0.90 0.99  CALCIUM 10.5 9.7    Liver Function Tests:  Recent Labs Lab 04/23/13 2150 04/24/13 0555  AST 15 12  ALT 11 10  ALKPHOS 62 55  BILITOT 1.1 1.0  PROT 7.4 6.7  ALBUMIN 3.6 3.2*    Recent Labs Lab 04/23/13 2150  LIPASE 24   No results found for this basename: AMMONIA,  in the last 168 hours CBC:  Recent Labs Lab 04/23/13 2150 04/24/13 0555  WBC 5.4 6.9  NEUTROABS  --  4.0  HGB 12.9* 11.2*  HCT 37.0* 32.7*  MCV 98.1 98.8  PLT 245 214   Cardiac Enzymes:  Recent Labs Lab 04/24/13 0555  TROPONINI <0.30   BNP: BNP (last 3 results)  Recent Labs  12/18/12 1950  PROBNP 229.4   CBG:  Recent Labs Lab 04/24/13 0425 04/24/13 0759  GLUCAP 124* 116*       Signed:  Jonette Mate N  [facility] 04/24/2013, 10:30 AM

## 2013-04-25 ENCOUNTER — Ambulatory Visit: Payer: Self-pay | Admitting: Internal Medicine

## 2013-04-26 NOTE — ED Provider Notes (Signed)
Medical screening examination/treatment/procedure(s) were performed by non-physician practitioner and as supervising physician I was immediately available for consultation/collaboration.   Wojciech Willetts T [employer] Null, MD 04/26/13 1333 

## 2013-04-28 ENCOUNTER — Ambulatory Visit: Payer: Self-pay | Admitting: Internal Medicine

## 2013-05-01 ENCOUNTER — Telehealth: Payer: Self-pay | Admitting: *Deleted

## 2013-05-01 DIAGNOSIS — R339 Retention of urine, unspecified: Secondary | ICD-10-CM

## 2013-05-01 NOTE — Telephone Encounter (Signed)
Lucendia Herrlich, pts wife, called requesting an order for UA for pt.  States pt has not urinated at all today.  Further states pt will not drink fluids either.  Please advise

## 2013-05-02 NOTE — Telephone Encounter (Signed)
Ok UA Thx 

## 2013-05-02 NOTE — Telephone Encounter (Signed)
Spoke with pts wife advised order for UA sent

## 2013-05-03 ENCOUNTER — Other Ambulatory Visit (INDEPENDENT_AMBULATORY_CARE_PROVIDER_SITE_OTHER): Payer: MEDICARE

## 2013-05-03 DIAGNOSIS — R339 Retention of urine, unspecified: Secondary | ICD-10-CM

## 2013-05-03 LAB — URINALYSIS, ROUTINE W REFLEX MICROSCOPIC
Bilirubin Urine: NEGATIVE
Ketones, ur: NEGATIVE
Leukocytes, UA: NEGATIVE
Nitrite: NEGATIVE
RBC / HPF: NONE SEEN (ref 0–?)
Specific Gravity, Urine: >=1.03 (ref 1.000–1.030)
Total Protein, Urine: 30
pH: 6 (ref 5.0–8.0)

## 2013-05-05 ENCOUNTER — Telehealth: Payer: Self-pay | Admitting: Neurology

## 2013-05-05 ENCOUNTER — Telehealth: Payer: Self-pay | Admitting: *Deleted

## 2013-05-05 MED ORDER — ARIPIPRAZOLE 5 MG PO TABS: 5.0000 mg | ORAL_TABLET | Freq: Every day | ORAL | Status: DC

## 2013-05-05 NOTE — Telephone Encounter (Signed)
Take pt to [facility] ER pls Thx

## 2013-05-05 NOTE — Telephone Encounter (Signed)
[name], pts wife, called states pt has not been eating and has become combative.  She is wanting to place him in a psych facility for approx 30 days.  Please advise

## 2013-05-05 NOTE — Telephone Encounter (Signed)
I called the patient and I talked with the wife. The patient has returned from [facility] psychiatric hospital. The patient appears to be in a delirium state at this time, agitated, swinging at his wife. The patient is confused, urinating on the floor, with decreased food and fluid intake. The patient has refused to take all of his medications including his diabetic pills. The wife will try to get some liquid Zoloft into him, I will call in Abilify, but I am concerned that he will not take the medication. If the agitation and confusion persist, the wife is to contact 911, and get the patient to the hospital.

## 2013-05-08 ENCOUNTER — Encounter (HOSPITAL_COMMUNITY): Payer: Self-pay | Admitting: Emergency Medicine

## 2013-05-08 ENCOUNTER — Inpatient Hospital Stay (HOSPITAL_COMMUNITY)
Admission: EM | Admit: 2013-05-08 | Discharge: 2013-05-15 | DRG: 305 | Disposition: A | Discharge status: Hospice | Payer: MEDICARE | Attending: Internal Medicine | Admitting: Internal Medicine

## 2013-05-08 DIAGNOSIS — R339 Retention of urine, unspecified: Secondary | ICD-10-CM

## 2013-05-08 DIAGNOSIS — Z9119 Patient's noncompliance with other medical treatment and regimen: Secondary | ICD-10-CM

## 2013-05-08 DIAGNOSIS — F329 Major depressive disorder, single episode, unspecified: Secondary | ICD-10-CM | POA: Diagnosis present

## 2013-05-08 DIAGNOSIS — Z820 Family history of epilepsy and other diseases of the nervous system: Secondary | ICD-10-CM

## 2013-05-08 DIAGNOSIS — R456 Violent behavior: Secondary | ICD-10-CM

## 2013-05-08 DIAGNOSIS — D72829 Elevated white blood cell count, unspecified: Secondary | ICD-10-CM

## 2013-05-08 DIAGNOSIS — I350 Nonrheumatic aortic (valve) stenosis: Secondary | ICD-10-CM

## 2013-05-08 DIAGNOSIS — Z7982 Long term (current) use of aspirin: Secondary | ICD-10-CM

## 2013-05-08 DIAGNOSIS — R627 Adult failure to thrive: Secondary | ICD-10-CM

## 2013-05-08 DIAGNOSIS — E860 Dehydration: Secondary | ICD-10-CM | POA: Diagnosis present

## 2013-05-08 DIAGNOSIS — I4891 Unspecified atrial fibrillation: Secondary | ICD-10-CM

## 2013-05-08 DIAGNOSIS — E119 Type 2 diabetes mellitus without complications: Secondary | ICD-10-CM

## 2013-05-08 DIAGNOSIS — E538 Deficiency of other specified B group vitamins: Secondary | ICD-10-CM

## 2013-05-08 DIAGNOSIS — E1142 Type 2 diabetes mellitus with diabetic polyneuropathy: Secondary | ICD-10-CM | POA: Diagnosis present

## 2013-05-08 DIAGNOSIS — E872 Acidosis, unspecified: Secondary | ICD-10-CM

## 2013-05-08 DIAGNOSIS — Z66 Do not resuscitate: Secondary | ICD-10-CM | POA: Diagnosis present

## 2013-05-08 DIAGNOSIS — M5481 Occipital neuralgia: Secondary | ICD-10-CM

## 2013-05-08 DIAGNOSIS — J189 Pneumonia, unspecified organism: Secondary | ICD-10-CM

## 2013-05-08 DIAGNOSIS — E785 Hyperlipidemia, unspecified: Secondary | ICD-10-CM

## 2013-05-08 DIAGNOSIS — G4700 Insomnia, unspecified: Secondary | ICD-10-CM

## 2013-05-08 DIAGNOSIS — Z87891 Personal history of nicotine dependence: Secondary | ICD-10-CM

## 2013-05-08 DIAGNOSIS — F29 Unspecified psychosis not due to a substance or known physiological condition: Secondary | ICD-10-CM

## 2013-05-08 DIAGNOSIS — R269 Unspecified abnormalities of gait and mobility: Secondary | ICD-10-CM

## 2013-05-08 DIAGNOSIS — Z800 Family history of malignant neoplasm of digestive organs: Secondary | ICD-10-CM

## 2013-05-08 DIAGNOSIS — R451 Restlessness and agitation: Secondary | ICD-10-CM

## 2013-05-08 DIAGNOSIS — F3289 Other specified depressive episodes: Secondary | ICD-10-CM | POA: Diagnosis present

## 2013-05-08 DIAGNOSIS — Z833 Family history of diabetes mellitus: Secondary | ICD-10-CM

## 2013-05-08 DIAGNOSIS — Z515 Encounter for palliative care: Secondary | ICD-10-CM

## 2013-05-08 DIAGNOSIS — M1990 Unspecified osteoarthritis, unspecified site: Secondary | ICD-10-CM

## 2013-05-08 DIAGNOSIS — Z91199 Patient's noncompliance with other medical treatment and regimen due to unspecified reason: Secondary | ICD-10-CM

## 2013-05-08 DIAGNOSIS — F0390 Unspecified dementia without behavioral disturbance: Secondary | ICD-10-CM | POA: Diagnosis present

## 2013-05-08 DIAGNOSIS — N138 Other obstructive and reflux uropathy: Secondary | ICD-10-CM | POA: Diagnosis present

## 2013-05-08 DIAGNOSIS — F22 Delusional disorders: Secondary | ICD-10-CM | POA: Diagnosis present

## 2013-05-08 DIAGNOSIS — N401 Enlarged prostate with lower urinary tract symptoms: Secondary | ICD-10-CM | POA: Diagnosis present

## 2013-05-08 DIAGNOSIS — E1149 Type 2 diabetes mellitus with other diabetic neurological complication: Secondary | ICD-10-CM | POA: Diagnosis present

## 2013-05-08 DIAGNOSIS — G609 Hereditary and idiopathic neuropathy, unspecified: Secondary | ICD-10-CM | POA: Diagnosis present

## 2013-05-08 DIAGNOSIS — R6889 Other general symptoms and signs: Secondary | ICD-10-CM

## 2013-05-08 DIAGNOSIS — E876 Hypokalemia: Secondary | ICD-10-CM | POA: Diagnosis present

## 2013-05-08 DIAGNOSIS — Z794 Long term (current) use of insulin: Secondary | ICD-10-CM

## 2013-05-08 DIAGNOSIS — D126 Benign neoplasm of colon, unspecified: Secondary | ICD-10-CM

## 2013-05-08 DIAGNOSIS — K219 Gastro-esophageal reflux disease without esophagitis: Secondary | ICD-10-CM | POA: Diagnosis present

## 2013-05-08 DIAGNOSIS — F03918 Unspecified dementia, unspecified severity, with other behavioral disturbance: Secondary | ICD-10-CM | POA: Diagnosis present

## 2013-05-08 DIAGNOSIS — R112 Nausea with vomiting, unspecified: Secondary | ICD-10-CM

## 2013-05-08 DIAGNOSIS — F0391 Unspecified dementia with behavioral disturbance: Secondary | ICD-10-CM | POA: Diagnosis present

## 2013-05-08 DIAGNOSIS — Z781 Physical restraint status: Secondary | ICD-10-CM | POA: Diagnosis present

## 2013-05-08 DIAGNOSIS — J9601 Acute respiratory failure with hypoxia: Secondary | ICD-10-CM

## 2013-05-08 DIAGNOSIS — R7989 Other specified abnormal findings of blood chemistry: Secondary | ICD-10-CM

## 2013-05-08 DIAGNOSIS — I10 Essential (primary) hypertension: Principal | ICD-10-CM | POA: Diagnosis present

## 2013-05-08 DIAGNOSIS — I498 Other specified cardiac arrhythmias: Secondary | ICD-10-CM | POA: Diagnosis present

## 2013-05-08 DIAGNOSIS — F411 Generalized anxiety disorder: Secondary | ICD-10-CM | POA: Diagnosis present

## 2013-05-08 DIAGNOSIS — R251 Tremor, unspecified: Secondary | ICD-10-CM

## 2013-05-08 DIAGNOSIS — K5790 Diverticulosis of intestine, part unspecified, without perforation or abscess without bleeding: Secondary | ICD-10-CM

## 2013-05-08 DIAGNOSIS — R079 Chest pain, unspecified: Secondary | ICD-10-CM

## 2013-05-08 DIAGNOSIS — R413 Other amnesia: Secondary | ICD-10-CM

## 2013-05-08 HISTORY — DX: Benign prostatic hyperplasia without lower urinary tract symptoms: N40.0

## 2013-05-08 LAB — CBC WITH DIFFERENTIAL/PLATELET
Basophils Absolute: 0 10*3/uL (ref 0.0–0.1)
Eosinophils Relative: 0 % (ref 0–5)
Lymphocytes Relative: 30 % (ref 12–46)
MCH: 34 pg (ref 26.0–34.0)
Neutro Abs: 6 10*3/uL (ref 1.7–7.7)
Neutrophils Relative %: 60 % (ref 43–77)
Platelets: 314 10*3/uL (ref 150–400)
RBC: 3.77 MIL/uL — ABNORMAL LOW (ref 4.22–5.81)
RDW: 15.1 % (ref 11.5–15.5)
WBC: 10 10*3/uL (ref 4.0–10.5)

## 2013-05-08 LAB — BASIC METABOLIC PANEL
CO2: 22 mEq/L (ref 19–32)
Calcium: 10.1 mg/dL (ref 8.4–10.5)
Chloride: 104 mEq/L (ref 96–112)
Creatinine, Ser: 0.88 mg/dL (ref 0.50–1.35)
GFR calc Af Amer: 88 mL/min — ABNORMAL LOW (ref >90)
GFR calc non Af Amer: 76 mL/min — ABNORMAL LOW (ref >90)
Potassium: 3.9 mEq/L (ref 3.5–5.1)
Sodium: 141 mEq/L (ref 135–145)

## 2013-05-08 LAB — LACTIC ACID, PLASMA: Lactic Acid, Venous: 5.6 mmol/L — ABNORMAL HIGH (ref 0.5–2.2)

## 2013-05-08 MED ORDER — ASPIRIN EC 81 MG PO TBEC
81.0000 mg | DELAYED_RELEASE_TABLET | Freq: Every day | ORAL | Status: DC
  Administered 2013-05-09: 81 mg via ORAL
  Filled 2013-05-08 (×3): qty 1

## 2013-05-08 MED ORDER — GLIPIZIDE ER 5 MG PO TB24
5.0000 mg | ORAL_TABLET | Freq: Every day | ORAL | Status: DC
  Administered 2013-05-09: 5 mg via ORAL
  Filled 2013-05-08 (×2): qty 1

## 2013-05-08 MED ORDER — ARIPIPRAZOLE 5 MG PO TABS
5.0000 mg | ORAL_TABLET | Freq: Every day | ORAL | Status: DC
  Administered 2013-05-09 – 2013-05-11 (×2): 5 mg via ORAL
  Filled 2013-05-08 (×4): qty 1

## 2013-05-08 MED ORDER — SODIUM CHLORIDE 0.9 % IV BOLUS (SEPSIS)
1000.0000 mL | Freq: Once | INTRAVENOUS | Status: AC
Start: 2013-05-08 — End: 2013-05-09
  Administered 2013-05-08: 1000 mL via INTRAVENOUS

## 2013-05-08 MED ORDER — ACETAMINOPHEN 325 MG PO TABS: 650.0000 mg | ORAL_TABLET | Freq: Four times a day (QID) | ORAL | Status: DC | PRN

## 2013-05-08 MED ORDER — POTASSIUM CHLORIDE ER 10 MEQ PO TBCR
10.0000 meq | EXTENDED_RELEASE_TABLET | Freq: Two times a day (BID) | ORAL | Status: DC
  Filled 2013-05-08 (×3): qty 1

## 2013-05-08 MED ORDER — ATENOLOL 25 MG PO TABS
25.0000 mg | ORAL_TABLET | Freq: Every day | ORAL | Status: DC | PRN
  Filled 2013-05-08: qty 1

## 2013-05-08 MED ORDER — HYDROCHLOROTHIAZIDE 12.5 MG PO CAPS
12.5000 mg | ORAL_CAPSULE | Freq: Every day | ORAL | Status: DC
  Administered 2013-05-09 – 2013-05-14 (×5): 12.5 mg via ORAL
  Filled 2013-05-08 (×7): qty 1

## 2013-05-08 MED ORDER — TRAMADOL HCL 50 MG PO TABS: 50.0000 mg | ORAL_TABLET | Freq: Four times a day (QID) | ORAL | Status: DC | PRN

## 2013-05-08 MED ORDER — METFORMIN HCL 500 MG PO TABS
1000.0000 mg | ORAL_TABLET | Freq: Every day | ORAL | Status: DC
  Administered 2013-05-09: 1000 mg via ORAL
  Filled 2013-05-08 (×2): qty 2

## 2013-05-08 MED ORDER — CLONAZEPAM 0.5 MG PO TABS: 0.5000 mg | ORAL_TABLET | Freq: Three times a day (TID) | ORAL | Status: DC | PRN

## 2013-05-08 MED ORDER — AMLODIPINE BESYLATE 5 MG PO TABS
5.0000 mg | ORAL_TABLET | Freq: Every morning | ORAL | Status: DC
  Administered 2013-05-09 – 2013-05-11 (×2): 5 mg via ORAL
  Filled 2013-05-08 (×2): qty 1
  Filled 2013-05-08: qty 0.5
  Filled 2013-05-08: qty 1

## 2013-05-08 MED ORDER — VITAMIN C 500 MG PO TABS
500.0000 mg | ORAL_TABLET | Freq: Every morning | ORAL | Status: DC
  Administered 2013-05-09 – 2013-05-11 (×2): 500 mg via ORAL
  Filled 2013-05-08 (×4): qty 1

## 2013-05-08 MED ORDER — DONEPEZIL HCL 5 MG PO TABS
5.0000 mg | ORAL_TABLET | Freq: Every day | ORAL | Status: DC
  Filled 2013-05-08 (×2): qty 1

## 2013-05-08 MED ORDER — AMLODIPINE BESYLATE 5 MG PO TABS: 5.0000 mg | ORAL_TABLET | Freq: Every day | ORAL | Status: DC

## 2013-05-08 MED ORDER — SODIUM CHLORIDE 0.9 % IV BOLUS (SEPSIS)
1000.0000 mL | Freq: Once | INTRAVENOUS | Status: AC
  Administered 2013-05-08: 1000 mL via INTRAVENOUS

## 2013-05-08 MED ORDER — VITAMIN B-6 100 MG PO TABS
100.0000 mg | ORAL_TABLET | Freq: Every morning | ORAL | Status: DC
  Administered 2013-05-09 – 2013-05-11 (×2): 100 mg via ORAL
  Filled 2013-05-08 (×4): qty 1

## 2013-05-08 MED ORDER — ONDANSETRON 4 MG PO TBDP
4.0000 mg | ORAL_TABLET | Freq: Three times a day (TID) | ORAL | Status: DC | PRN
  Filled 2013-05-08: qty 1

## 2013-05-08 MED ORDER — VALSARTAN-HYDROCHLOROTHIAZIDE 320-12.5 MG PO TABS: 1.0000 | ORAL_TABLET | Freq: Every day | ORAL | Status: DC

## 2013-05-08 MED ORDER — HALOPERIDOL LACTATE 5 MG/ML IJ SOLN
2.5000 mg | Freq: Once | INTRAMUSCULAR | Status: AC
  Administered 2013-05-08: 2.5 mg via INTRAMUSCULAR
  Filled 2013-05-08: qty 1

## 2013-05-08 MED ORDER — IRBESARTAN 300 MG PO TABS
300.0000 mg | ORAL_TABLET | Freq: Every day | ORAL | Status: DC
  Administered 2013-05-09 – 2013-05-14 (×5): 300 mg via ORAL
  Filled 2013-05-08 (×7): qty 1

## 2013-05-08 MED ORDER — SERTRALINE HCL 25 MG PO TABS
25.0000 mg | ORAL_TABLET | Freq: Every day | ORAL | Status: DC
  Administered 2013-05-09 – 2013-05-15 (×6): 25 mg via ORAL
  Filled 2013-05-08 (×7): qty 1

## 2013-05-08 MED ORDER — METFORMIN HCL 500 MG PO TABS
500.0000 mg | ORAL_TABLET | Freq: Every day | ORAL | Status: DC
  Filled 2013-05-08: qty 1

## 2013-05-08 MED ORDER — ZOLPIDEM TARTRATE 10 MG PO TABS: 10.0000 mg | ORAL_TABLET | Freq: Every evening | ORAL | Status: DC | PRN

## 2013-05-08 NOTE — ED Notes (Signed)
Per EMS. Pt from home, hx of dementia. Was sent to ED by wife due to increased confusion. Pt was swearing at wife and wife felt out of control. Wife also concerned pt has not eaten in 24 hours.

## 2013-05-08 NOTE — Progress Notes (Signed)
Social worker and case [place] with patient's wife in reference to possible resources for care or placement. Patient was physically aggressive and combative toward's staff. Patient's wife verbalizes difficulty in caring for the patient at home.
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
## 2013-05-08 NOTE — Progress Notes (Signed)
EDCM spoke to patient and patient's wife at bedside.  Patient's son currently leaving for the evening.  As per patient's son, "I live in [place] so I don't live far and can be here quick if I needed too."  Patient currenlty lives with his wife.  Patient's wife reports he has a walker, bathroom guard rails and a bedside commode x2.  Patient's wife reports he needs help with bathing, but patient able to dress himself.  Patient's wife reports that he is able to walk with a walker without difficulty but lately he hasn't because he has been so weak.  Patient's wife reports she has a sitter stay with the patient two times a week.  Patient reports, "I would like to take him home, but I don't know if he will take his medications or not."  [facility] asked patient if she felt safe.  Patient's wife stated, "Sometimes, but lately no."  Patient's wife reports he has slipped out of bed to the floor, but did not hurt himself yesterday.  Patient's wife reports she does not want him to go back to [facility], "He fell there, and it was a bad experience."  Patient's wife reports she has been looking into places for patient to go such as the [facility] and the [facility].  EDCM provided patient with a list of home health agencies in [facility] who can provide a visiting RN, PT, OT aide and Child psychotherapist.  EDCM asked patient's wife how she felt about home health.  Patient's wife replied, "I'm not sure.  People have come out before to help him with his medications but he still doesn't take them."  Patient's wife very anxious in the room.  Everytime the patient moved in the bed, patient's wife would jump.  As per ED RN patient grabbed his wife's hand and started bending it backwards.  No further needs at this time.  Discussed patient with EDP.
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
## 2013-05-08 NOTE — BH Assessment (Signed)
Assessment Note  [name] is an 79 y.o. male with a history of dementia.  Pt currently lives with his wife of 53 years.  Pt has no other local supports.   Pt presented to the ED after being increasingly aggressive at home, refusing to eat, and take medications.  Pt recently discharged from [facility] ger[facility] facility.  Pt was irritable and combative towards staff upon arrival and CSW witnessed pt being aggressive/combative towards his wife because he thought she was "taking my cards and cheating" even though he and his wife were not playing cards and there were no cards in the room.  Pt was cooperative during most of the assessment but became increasingly agitated.   Pt denies SI/HI/AH/VH.  Pt denies being depressed.  Pt reports that he has not been eating "because I'm not hungry".  Pt is a pt of Dr. Anne Hahn who is treating him for his dementia.    Pt was extremely agitated during his visit.  CSW spoke with wife who was at his bedside and she reports she feels he has become "more agitated and aggressive since he came back from [place]".   Wife considering home health services and possible respite in a memory care facility.   CSW recommends that pt have psych consult in the am when he might be less agitated to evaluate him for possible inpatient stabilization. CSW also recommends consult with nurse case management concerning possible home health resources.   CSW consulted with EDP Docherty concerning recommendations.  Axis I: Mood Disorder NOS and Dementia Axis II: Deferred Axis III:  Past Medical History  Diagnosis Date  . Carotid artery stenosis   . Gait disturbance   . Anxiety   . GERD (gastroesophageal reflux disease)   . Vitamin B12 deficiency   . Depression   . Osteoarthritis   . Diabetes mellitus, type 2   . Hypertension   . Vitamin D deficiency   . Tremor   . Aortic stenosis   . Hyperlipidemia   . Diverticulosis   . Adenomatous polyp of colon   . Memory loss    . Polyneuropathy in diabetes(357.2)   . Neuropathy   . Dementia   . Dysrhythmia    Axis IV: other psychosocial or environmental problems, problems related to social environment and problems with primary support group Axis V: 31-40 impairment in reality testing  Past Medical History:  Past Medical History  Diagnosis Date  . Carotid artery stenosis   . Gait disturbance   . Anxiety   . GERD (gastroesophageal reflux disease)   . Vitamin B12 deficiency   . Depression   . Osteoarthritis   . Diabetes mellitus, type 2   . Hypertension   . Vitamin D deficiency   . Tremor   . Aortic stenosis   . Hyperlipidemia   . Diverticulosis   . Adenomatous polyp of colon   . Memory loss   . Polyneuropathy in diabetes(357.2)   . Neuropathy   . Dementia   . Dysrhythmia     Past Surgical History  Procedure Laterality Date  . Vasectomy    . Lumbar laminectomy      L5 Dr Ophelia Charter  . Transurethral resection of prostate  March 2007  . Cataract extraction, bilateral Bilateral   . Tonsillectomy and adenoidectomy    . Left foot      Family History:  Family History  Problem Relation Age of Onset  . Diabetes Father   . Colon cancer Brother   .  Heart disease Brother   . Diabetes Brother   . Parkinson's disease Sister   . Arthritis Sister     Social History:  reports that he has quit smoking. His smoking use included Cigars. He has never used smokeless tobacco. He reports that he does not drink alcohol or use illicit drugs.  Additional Social History:     CIWA: CIWA-Ar BP: 208/95 mmHg Pulse Rate: 73 COWS:    Allergies:  Allergies  Allergen Reactions  . Citalopram Hydrobromide Other (See Comments)    no reaction, refused to take  . Duloxetine Other (See Comments)    did not like, refused to continue    Home Medications:  (Not in a hospital admission)  OB/GYN Status:  No LMP for male patient.  General Assessment Data Location of Assessment: [facility] ED Is this a Tele or  Face-to-Face Assessment?: Face-to-Face Living Arrangements: Spouse/significant other Can pt return to current living arrangement?: Yes Admission Status: Voluntary Is patient capable of signing voluntary admission?: No Transfer from: Home Referral Source: Self/Family/Friend     [facility] Crisis Care Plan Living Arrangements: Spouse/significant other  Education Status Is patient currently in school?: No  Risk to self Suicidal Ideation: No Suicidal Intent: No Is patient at risk for suicide?: No Suicidal Plan?: No Access to Means: No What has been your use of drugs/alcohol within the last 12 months?: none Previous Attempts/Gestures: No How many times?: 0 Other Self Harm Risks: none Triggers for Past Attempts: None known Intentional Self Injurious Behavior: None Family Suicide History: Unknown Recent stressful life event(s): Other (Comment) (pt recently inpatient in a geriatric pysch facility) Persecutory voices/beliefs?: No Depression: No Depression Symptoms:  (none) Substance abuse history and/or treatment for substance abuse?: No Suicide prevention information given to non-admitted patients: Not applicable  Risk to Others Homicidal Ideation: No Thoughts of Harm to Others: No Current Homicidal Intent: No Current Homicidal Plan: No Access to Homicidal Means: No Identified Victim: none History of harm to others?: Yes (Pt has been aggressive with wife) Assessment of Violence: On admission Violent Behavior Description:  (combative with staff) Does patient have access to weapons?: No Criminal Charges Pending?: No Does patient have a court date: No  Psychosis Hallucinations: None noted Delusions: None noted  Mental Status Report Appear/Hygiene: Disheveled Eye Contact: Fair Motor Activity: Freedom of movement Speech: Logical/coherent Level of Consciousness: Alert;Restless;Combative;Irritable Mood: Anxious;Angry Affect: Irritable Anxiety Level: Minimal Thought Processes:  Coherent;Relevant Judgement: Impaired Orientation: Person;Place Obsessive Compulsive Thoughts/Behaviors: Minimal  Cognitive Functioning Concentration: Decreased Memory: Remote Intact;Recent Impaired IQ: Average Insight: Poor Impulse Control: Poor Appetite: Poor Weight Loss:  (unknown) Weight Gain: 0 Sleep: No Change Total Hours of Sleep:  (UTA/pt became irritated by questions ) Vegetative Symptoms:  (UTA)  ADLScreening [facility] Assessment Services) Patient's cognitive ability adequate to safely complete daily activities?: No Patient able to express need for assistance with ADLs?: Yes Independently performs ADLs?: No  Prior Inpatient Therapy Prior Inpatient Therapy: Yes Prior Therapy Dates: 03/2013 (Per pts wife ) Prior Therapy Facilty/Provider(s):  [facility]) Reason for Treatment:  (Dementia/Paranoia/Agressiveness)  Prior Outpatient Therapy Prior Outpatient Therapy: Yes Prior Therapy Dates: ongoing (per pts wife) Prior Therapy Facilty/Provider(s): Dr. Anne Hahn (Dr. Anne Hahn) Reason for Treatment: Dementia  ADL Screening (condition at time of admission) Patient's cognitive ability adequate to safely complete daily activities?: No Patient able to express need for assistance with ADLs?: Yes Independently performs ADLs?: No                  Additional Information 1:1  In Past 12 Months?: No CIRT Risk: No Elopement Risk: No Does patient have medical clearance?: Yes     Disposition:  Disposition Initial Assessment Completed for this Encounter: Yes Disposition of Patient: Other dispositions (Disposition pending psych consult) Type of inpatient treatment program: Adult Other disposition(s): Other (Comment) (Disposition pending psych consult)  On Site Evaluation by:   Reviewed with Physician:    Lexine Baton 05/08/2013 10:29 PM

## 2013-05-08 NOTE — Telephone Encounter (Signed)
Spoke with pt advised of MDs message 

## 2013-05-09 ENCOUNTER — Inpatient Hospital Stay (HOSPITAL_COMMUNITY): Payer: MEDICARE

## 2013-05-09 ENCOUNTER — Encounter (HOSPITAL_COMMUNITY): Payer: Self-pay | Admitting: Emergency Medicine

## 2013-05-09 DIAGNOSIS — I10 Essential (primary) hypertension: Secondary | ICD-10-CM | POA: Insufficient documentation

## 2013-05-09 DIAGNOSIS — F29 Unspecified psychosis not due to a substance or known physiological condition: Secondary | ICD-10-CM

## 2013-05-09 DIAGNOSIS — E872 Acidosis: Secondary | ICD-10-CM

## 2013-05-09 DIAGNOSIS — R269 Unspecified abnormalities of gait and mobility: Secondary | ICD-10-CM

## 2013-05-09 LAB — CREATININE, SERUM
GFR calc Af Amer: >90 mL/min (ref >90)
GFR calc non Af Amer: 79 mL/min — ABNORMAL LOW (ref >90)

## 2013-05-09 LAB — URINALYSIS, ROUTINE W REFLEX MICROSCOPIC
Ketones, ur: NEGATIVE mg/dL
Leukocytes, UA: NEGATIVE
Nitrite: NEGATIVE
Specific Gravity, Urine: 1.015 (ref 1.005–1.030)
pH: 6.5 (ref 5.0–8.0)

## 2013-05-09 LAB — URINE MICROSCOPIC-ADD ON

## 2013-05-09 LAB — CBC
HCT: 32.5 % — ABNORMAL LOW (ref 39.0–52.0)
Hemoglobin: 11.5 g/dL — ABNORMAL LOW (ref 13.0–17.0)
MCHC: 35.4 g/dL (ref 30.0–36.0)
RBC: 3.33 MIL/uL — ABNORMAL LOW (ref 4.22–5.81)
WBC: 7.3 10*3/uL (ref 4.0–10.5)

## 2013-05-09 LAB — GLUCOSE, CAPILLARY
Glucose-Capillary: 155 mg/dL — ABNORMAL HIGH (ref 70–99)
Glucose-Capillary: 124 mg/dL — ABNORMAL HIGH (ref 70–99)
Glucose-Capillary: 107 mg/dL — ABNORMAL HIGH (ref 70–99)

## 2013-05-09 LAB — LACTIC ACID, PLASMA: Lactic Acid, Venous: 2.6 mmol/L — ABNORMAL HIGH (ref 0.5–2.2)

## 2013-05-09 LAB — TROPONIN I: Troponin I: <0.3 ng/mL (ref <0.30)

## 2013-05-09 LAB — RAPID URINE DRUG SCREEN, HOSP PERFORMED: Opiates: NOT DETECTED

## 2013-05-09 LAB — MRSA PCR SCREENING: MRSA by PCR: NEGATIVE

## 2013-05-09 MED ORDER — ACETAMINOPHEN 325 MG PO TABS: 650.0000 mg | ORAL_TABLET | Freq: Four times a day (QID) | ORAL | Status: DC | PRN

## 2013-05-09 MED ORDER — ATENOLOL 25 MG PO TABS
25.0000 mg | ORAL_TABLET | Freq: Every day | ORAL | Status: DC
  Administered 2013-05-09: 25 mg via ORAL
  Filled 2013-05-09 (×2): qty 1

## 2013-05-09 MED ORDER — HYDRALAZINE HCL 20 MG/ML IJ SOLN
10.0000 mg | INTRAMUSCULAR | Status: DC | PRN
  Administered 2013-05-09 – 2013-05-11 (×2): 10 mg via INTRAVENOUS
  Filled 2013-05-09 (×2): qty 1

## 2013-05-09 MED ORDER — LORAZEPAM 2 MG/ML IJ SOLN
1.0000 mg | Freq: Once | INTRAMUSCULAR | Status: AC
  Administered 2013-05-09: 1 mg via INTRAVENOUS
  Filled 2013-05-09: qty 1

## 2013-05-09 MED ORDER — CHOLECALCIFEROL 25 MCG (1000 UT) PO CAPS
1000.0000 [IU] | ORAL_CAPSULE | Freq: Every morning | ORAL | Status: DC
  Administered 2013-05-09 – 2013-05-11 (×2): 1000 [IU] via ORAL
  Filled 2013-05-09 (×4): qty 1

## 2013-05-09 MED ORDER — ACETAMINOPHEN 650 MG RE SUPP: 650.0000 mg | Freq: Four times a day (QID) | RECTAL | Status: DC | PRN

## 2013-05-09 MED ORDER — ZOLPIDEM TARTRATE 5 MG PO TABS: 15.0000 mg | ORAL_TABLET | Freq: Every day | ORAL | Status: DC

## 2013-05-09 MED ORDER — POTASSIUM CHLORIDE ER 10 MEQ PO TBCR
20.0000 meq | EXTENDED_RELEASE_TABLET | Freq: Every day | ORAL | Status: DC
  Administered 2013-05-11: 11:00:00 20 meq via ORAL
  Filled 2013-05-09 (×3): qty 2

## 2013-05-09 MED ORDER — DOCUSATE SODIUM 100 MG PO CAPS
100.0000 mg | ORAL_CAPSULE | Freq: Two times a day (BID) | ORAL | Status: DC
  Administered 2013-05-09 – 2013-05-15 (×11): 100 mg via ORAL
  Filled 2013-05-09 (×14): qty 1

## 2013-05-09 MED ORDER — LORAZEPAM 2 MG/ML IJ SOLN
1.0000 mg | INTRAMUSCULAR | Status: DC | PRN
  Administered 2013-05-09: 1 mg via INTRAVENOUS
  Administered 2013-05-09: 2 mg via INTRAVENOUS
  Administered 2013-05-10: 1 mg via INTRAVENOUS
  Administered 2013-05-10: 2 mg via INTRAVENOUS
  Administered 2013-05-11 (×2): 1 mg via INTRAVENOUS
  Filled 2013-05-09 (×7): qty 1

## 2013-05-09 MED ORDER — CLONAZEPAM 0.5 MG PO TABS: 0.5000 mg | ORAL_TABLET | Freq: Three times a day (TID) | ORAL | Status: DC

## 2013-05-09 MED ORDER — SENNA 8.6 MG PO TABS
1.0000 | ORAL_TABLET | Freq: Two times a day (BID) | ORAL | Status: DC
  Administered 2013-05-09 – 2013-05-15 (×10): 8.6 mg via ORAL
  Filled 2013-05-09 (×11): qty 1

## 2013-05-09 MED ORDER — SODIUM CHLORIDE 0.9 % IV BOLUS (SEPSIS)
1000.0000 mL | Freq: Once | INTRAVENOUS | Status: AC
  Administered 2013-05-09: 1000 mL via INTRAVENOUS

## 2013-05-09 MED ORDER — ZOLPIDEM TARTRATE 5 MG PO TABS
5.0000 mg | ORAL_TABLET | Freq: Every day | ORAL | Status: DC
  Administered 2013-05-09 – 2013-05-13 (×5): 5 mg via ORAL
  Filled 2013-05-09 (×5): qty 1

## 2013-05-09 MED ORDER — CLONIDINE HCL 0.1 MG PO TABS
0.1000 mg | ORAL_TABLET | Freq: Three times a day (TID) | ORAL | Status: DC
  Administered 2013-05-09: 0.1 mg via ORAL
  Filled 2013-05-09 (×3): qty 1

## 2013-05-09 MED ORDER — DICLOFENAC SODIUM 1 % TD GEL
2.0000 g | Freq: Two times a day (BID) | TRANSDERMAL | Status: DC | PRN
  Filled 2013-05-09: qty 100

## 2013-05-09 MED ORDER — CLONAZEPAM 0.5 MG PO TABS
0.5000 mg | ORAL_TABLET | Freq: Three times a day (TID) | ORAL | Status: DC
  Administered 2013-05-09 – 2013-05-11 (×6): 0.5 mg via ORAL
  Filled 2013-05-09 (×8): qty 1

## 2013-05-09 MED ORDER — POLYETHYLENE GLYCOL 3350 17 G PO PACK
17.0000 g | PACK | Freq: Every day | ORAL | Status: DC | PRN
  Filled 2013-05-09: qty 1

## 2013-05-09 MED ORDER — RISPERIDONE 1 MG/ML PO SOLN
0.5000 mg | Freq: Two times a day (BID) | ORAL | Status: DC
  Administered 2013-05-09 – 2013-05-11 (×4): 0.5 mg via ORAL
  Filled 2013-05-09 (×8): qty 0.5

## 2013-05-09 MED ORDER — SODIUM CHLORIDE 0.9 % IV BOLUS (SEPSIS)
1000.0000 mL | INTRAVENOUS | Status: DC | PRN
  Administered 2013-05-10 (×2): 1000 mL via INTRAVENOUS

## 2013-05-09 MED ORDER — SODIUM CHLORIDE 0.9 % IJ SOLN
3.0000 mL | Freq: Two times a day (BID) | INTRAMUSCULAR | Status: DC
  Administered 2013-05-09: 3 mL via INTRAVENOUS
  Administered 2013-05-09: 10 mL via INTRAVENOUS
  Administered 2013-05-12 – 2013-05-15 (×4): 3 mL via INTRAVENOUS

## 2013-05-09 MED ORDER — SODIUM CHLORIDE 0.9 % IV SOLN
Freq: Once | INTRAVENOUS | Status: AC
Start: 2013-05-09 — End: 2013-05-09
  Administered 2013-05-09: 05:00:00 via INTRAVENOUS

## 2013-05-09 MED ORDER — SODIUM CHLORIDE 0.9 % IV SOLN
INTRAVENOUS | Status: DC
  Administered 2013-05-09: 12:00:00 via INTRAVENOUS

## 2013-05-09 MED ORDER — ENOXAPARIN SODIUM 40 MG/0.4ML ~~LOC~~ SOLN
40.0000 mg | SUBCUTANEOUS | Status: DC
  Administered 2013-05-09: 40 mg via SUBCUTANEOUS
  Filled 2013-05-09 (×2): qty 0.4

## 2013-05-09 MED ORDER — INSULIN ASPART 100 UNIT/ML ~~LOC~~ SOLN
0.0000 [IU] | Freq: Three times a day (TID) | SUBCUTANEOUS | Status: DC
  Administered 2013-05-09 – 2013-05-11 (×2): 1 [IU] via SUBCUTANEOUS
  Administered 2013-05-11: 2 [IU] via SUBCUTANEOUS
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
## 2013-05-09 NOTE — ED Notes (Signed)
EDP wanted to see if BP meds could be given prior to transferring to room.
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
## 2013-05-09 NOTE — Progress Notes (Signed)
Pt became agitated and verbally abusive to spouse before she left this evening. He was having cyclic conversations regarding playing cards. Last week per spouse his topic was jewelry, she states that each week its something different. His spouse was very tearful and feels she has done her best to keep him at home but she is getting bruised and psychologically drained. Her physician has given her medication to help her cope with caring for a spouse with dementia.   The patient's son will arrive on Thursday, he has not seen the level of dementia progression. He has made request that he not return to [place] because of the "[employer]" or to [facility].   [facility] has done a preadmission assessment, they also have a current open placement. His spouse has visited the location and feels it is suitable.
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
## 2013-05-09 NOTE — Progress Notes (Signed)
Discussed at Long Length of Stay Meeting:  Recommendations to start pt on Depakote, Seroquel and namenda for dementia regimen

## 2013-05-09 NOTE — Progress Notes (Signed)
Noticed that patient's blood pressure declined significantly since 1500.  Will d/c clonidine and put hold parameters on his other medications

## 2013-05-09 NOTE — Progress Notes (Signed)
Pt pending psychiatric evaluation. CSW aware that patient may need memory care once psychiatrically and medically stable. Pt wife interested in [facility] and [facility]. CSW will help facilitate pt disposition, however awaiting further evaluation.   Catha Gosselin, [place] [phone]  ED CSW 05/09/2013 812am
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
## 2013-05-09 NOTE — Progress Notes (Signed)
UR completed 

## 2013-05-10 DIAGNOSIS — I10 Essential (primary) hypertension: Secondary | ICD-10-CM

## 2013-05-10 DIAGNOSIS — F911 Conduct disorder, childhood-onset type: Secondary | ICD-10-CM

## 2013-05-10 DIAGNOSIS — F0390 Unspecified dementia without behavioral disturbance: Secondary | ICD-10-CM

## 2013-05-10 DIAGNOSIS — E872 Acidosis: Secondary | ICD-10-CM

## 2013-05-10 LAB — CBC
MCHC: 35.4 g/dL (ref 30.0–36.0)
MCV: 97.5 fL (ref 78.0–100.0)
Platelets: 208 10*3/uL (ref 150–400)
RBC: 2.84 MIL/uL — ABNORMAL LOW (ref 4.22–5.81)
RDW: 15.3 % (ref 11.5–15.5)
WBC: 5.7 10*3/uL (ref 4.0–10.5)

## 2013-05-10 LAB — BASIC METABOLIC PANEL
CO2: 23 mEq/L (ref 19–32)
Chloride: 111 mEq/L (ref 96–112)
Creatinine, Ser: 0.84 mg/dL (ref 0.50–1.35)
GFR calc non Af Amer: 77 mL/min — ABNORMAL LOW (ref >90)

## 2013-05-10 LAB — URINALYSIS W MICROSCOPIC + REFLEX CULTURE
Glucose, UA: NEGATIVE mg/dL
Hgb urine dipstick: NEGATIVE
Ketones, ur: NEGATIVE mg/dL
Leukocytes, UA: NEGATIVE
Protein, ur: NEGATIVE mg/dL
pH: 6 (ref 5.0–8.0)

## 2013-05-10 LAB — LACTIC ACID, PLASMA: Lactic Acid, Venous: 0.8 mmol/L (ref 0.5–2.2)

## 2013-05-10 LAB — GLUCOSE, CAPILLARY: Glucose-Capillary: 74 mg/dL (ref 70–99)

## 2013-05-10 MED ORDER — ENSURE PUDDING PO PUDG
1.0000 | ORAL | Status: DC
  Administered 2013-05-10 – 2013-05-12 (×3): 1 via ORAL
  Filled 2013-05-10 (×6): qty 1

## 2013-05-10 MED ORDER — SODIUM CHLORIDE 0.9 % IV SOLN
INTRAVENOUS | Status: DC
  Administered 2013-05-10 – 2013-05-12 (×3): via INTRAVENOUS
  Filled 2013-05-10 (×6): qty 1000

## 2013-05-10 MED ORDER — POTASSIUM CHLORIDE 10 MEQ/100ML IV SOLN
10.0000 meq | INTRAVENOUS | Status: AC
  Administered 2013-05-10 (×3): 10 meq via INTRAVENOUS
  Filled 2013-05-10 (×3): qty 100

## 2013-05-10 MED ORDER — POTASSIUM CHLORIDE 10 MEQ/100ML IV SOLN
10.0000 meq | INTRAVENOUS | Status: AC
  Administered 2013-05-10: 10 meq via INTRAVENOUS
  Filled 2013-05-10 (×4): qty 100

## 2013-05-10 MED ORDER — ADULT MULTIVITAMIN W/MINERALS CH
1.0000 | ORAL_TABLET | Freq: Every day | ORAL | Status: DC
  Administered 2013-05-11: 17:00:00 1 via ORAL
  Filled 2013-05-10 (×3): qty 1

## 2013-05-10 MED ORDER — DEXTROSE 50 % IV SOLN
25.0000 mL | Freq: Once | INTRAVENOUS | Status: AC | PRN
  Administered 2013-05-10: 25 mL via INTRAVENOUS

## 2013-05-10 MED ORDER — POTASSIUM CHLORIDE 20 MEQ/15ML (10%) PO LIQD
20.0000 meq | Freq: Once | ORAL | Status: DC
  Filled 2013-05-10: qty 15

## 2013-05-10 MED ORDER — DEXTROSE 50 % IV SOLN
INTRAVENOUS | Status: AC
  Filled 2013-05-10: qty 50

## 2013-05-10 NOTE — Progress Notes (Signed)
Order to hold Lovenox due to bloody urine.

## 2013-05-10 NOTE — Evaluation (Signed)
I have reviewed this note and agree with all findings. Kati Ilka Lovick, PT, DPT Pager: [phone]   

## 2013-05-10 NOTE — Consult Note (Signed)
Psychiatry Progress Note    Assessment: AXIS I:  Psychotic Disorder NOS AXIS II:  Deferred AXIS III:   Past Medical History  Diagnosis Date  . Carotid artery stenosis   . Gait disturbance   . Anxiety   . GERD (gastroesophageal reflux disease)   . Vitamin B12 deficiency   . Depression   . Osteoarthritis   . Diabetes mellitus, type 2   . Hypertension   . Vitamin D deficiency   . Tremor   . Aortic stenosis     murmur  . Hyperlipidemia   . Diverticulosis   . Adenomatous polyp of colon   . Memory loss   . Polyneuropathy in diabetes(357.2)   . Dementia   . Dysrhythmia   . BPH (benign prostatic hyperplasia)    AXIS IV:  other psychosocial or environmental problems and problems with primary support group AXIS V:  11-20 some danger of hurting self or others possible OR occasionally fails to maintain minimal personal hygiene OR gross impairment in communication  Plan:  Recommend psychiatric Inpatient admission when medically cleared.  Subjective:   [name] is a 79 y.o. male patient admitted with dementia agitation and elevated blood pressure.  HPI:  Patient seen chart reviewed.  Patient was given Ativan last night and again this morning because of agitation.  Patient was sleeping most of the day.  I spoke to his wife, patient has been eating and drinking however he remains paranoid.  He is on Risperdal 0.5 mg twice a day. I also noticed that he is on Abilify 5 mg which can be discontinued if he is on Risperdal.  There is no need to give 2 antipsychotic medication.  Past Psychiatric History: Past Medical History  Diagnosis Date  . Carotid artery stenosis   . Gait disturbance   . Anxiety   . GERD (gastroesophageal reflux disease)   . Vitamin B12 deficiency   . Depression   . Osteoarthritis   . Diabetes mellitus, type 2   . Hypertension   . Vitamin D deficiency   . Tremor   . Aortic stenosis     murmur  . Hyperlipidemia   . Diverticulosis   . Adenomatous polyp  of colon   . Memory loss   . Polyneuropathy in diabetes(357.2)   . Dementia   . Dysrhythmia   . BPH (benign prostatic hyperplasia)     reports that he has quit smoking. His smoking use included Cigars. He has never used smokeless tobacco. He reports that he does not drink alcohol or use illicit drugs. Family History  Problem Relation Age of Onset  . Diabetes Father   . Colon cancer Brother   . Heart disease Brother   . Diabetes Brother   . Parkinson's disease Sister   . Arthritis Sister   . Alzheimer's disease Sister    Family History Substance Abuse: No Family Supports: Yes, List: (wife) Living Arrangements: Spouse/significant other Can pt return to current living arrangement?: Yes Abuse/Neglect [facility]) Physical Abuse: Denies Verbal Abuse: Denies Sexual Abuse: Denies Allergies:   Allergies  Allergen Reactions  . Citalopram Hydrobromide Other (See Comments)    no reaction, refused to take  . Duloxetine Other (See Comments)    did not like, refused to continue    ACT Assessment Complete:  Yes:    Educational Status    Risk to Self: Risk to self Suicidal Ideation: No Suicidal Intent: No Is patient at risk for suicide?: No Suicidal Plan?: No Access to Means:  No What has been your use of drugs/alcohol within the last 12 months?: none Previous Attempts/Gestures: No How many times?: 0 Other Self Harm Risks: none Triggers for Past Attempts: None known Intentional Self Injurious Behavior: None Family Suicide History: Unknown Recent stressful life event(s): Other (Comment) (pt recently inpatient in a geriatric pysch facility) Persecutory voices/beliefs?: No Depression: No Depression Symptoms:  (none) Substance abuse history and/or treatment for substance abuse?: No Suicide prevention information given to non-admitted patients: Not applicable  Risk to Others: Risk to Others Homicidal Ideation: No Thoughts of Harm to Others: No Current Homicidal Intent: No Current  Homicidal Plan: No Access to Homicidal Means: No Identified Victim: none History of harm to others?: Yes (Pt has been aggressive with wife) Assessment of Violence: On admission Violent Behavior Description:  (combative with staff) Does patient have access to weapons?: No Criminal Charges Pending?: No Does patient have a court date: No  Abuse: Abuse/Neglect Assessment (Assessment to be complete while patient is alone) Physical Abuse: Denies Verbal Abuse: Denies Sexual Abuse: Denies Exploitation of patient/patient's resources: Denies Self-Neglect: Denies  Prior Inpatient Therapy: Prior Inpatient Therapy Prior Inpatient Therapy: Yes Prior Therapy Dates: 03/2013 (Per pts wife ) Prior Therapy Facilty/Provider(s):  [facility]) Reason for Treatment:  (Dementia/Paranoia/Agressiveness)  Prior Outpatient Therapy: Prior Outpatient Therapy Prior Outpatient Therapy: Yes Prior Therapy Dates: ongoing (per pts wife) Prior Therapy Facilty/Provider(s): Dr. Anne Hahn (Dr. Anne Hahn) Reason for Treatment: Dementia  Additional Information: Additional Information 1:1 In Past 12 Months?: No CIRT Risk: No Elopement Risk: No Does patient have medical clearance?: Yes                  Objective: Blood pressure 140/64, pulse 54, temperature 98.1 F (36.7 C), temperature source Oral, resp. rate 13, height 5\' 3"  (1.6 m), weight 131 lb 6.3 oz (59.6 kg), SpO2 99.00%.Body mass index is 23.28 kg/(m^2). Results for orders placed during the hospital encounter of 05/08/13 (from the past 72 hour(s))  CBC WITH DIFFERENTIAL     Status: Abnormal   Collection Time    05/08/13  7:11 PM      Result Value Range   WBC 10.0  4.0 - 10.5 K/uL   RBC 3.77 (*) 4.22 - 5.81 MIL/uL   Hemoglobin 12.8 (*) 13.0 - 17.0 g/dL   HCT 08.6 (*) 57.8 - 46.9 %   MCV 98.1  78.0 - 100.0 fL   MCH 34.0  26.0 - 34.0 pg   MCHC 34.6  30.0 - 36.0 g/dL   RDW 62.9  52.8 - 41.3 %   Platelets 314  150 - 400 K/uL    Neutrophils Relative % 60  43 - 77 %   Neutro Abs 6.0  1.7 - 7.7 K/uL   Lymphocytes Relative 30  12 - 46 %   Lymphs Abs 3.0  0.7 - 4.0 K/uL   Monocytes Relative 11  3 - 12 %   Monocytes Absolute 1.1 (*) 0.1 - 1.0 K/uL   Eosinophils Relative 0  0 - 5 %   Eosinophils Absolute 0.0  0.0 - 0.7 K/uL   Basophils Relative 0  0 - 1 %   Basophils Absolute 0.0  0.0 - 0.1 K/uL  BASIC METABOLIC PANEL     Status: Abnormal   Collection Time    05/08/13  7:11 PM      Result Value Range   Sodium 141  135 - 145 mEq/L   Potassium 3.9  3.5 - 5.1 mEq/L   Chloride 104  96 - 112 mEq/L   CO2 22  19 - 32 mEq/L   Glucose, Bld 148 (*) 70 - 99 mg/dL   BUN 17  6 - 23 mg/dL   Creatinine, Ser 1.61  0.50 - 1.35 mg/dL   Calcium 09.6  8.4 - 04.5 mg/dL   GFR calc non Af Amer 76 (*) >90 mL/min   GFR calc Af Amer 88 (*) >90 mL/min   Comment: (NOTE)     The eGFR has been calculated using the CKD EPI equation.     This calculation has not been validated in all clinical situations.     eGFR's persistently <90 mL/min signify possible Chronic Kidney     Disease.  LACTIC ACID, PLASMA     Status: Abnormal   Collection Time    05/08/13  7:11 PM      Result Value Range   Lactic Acid, Venous 5.6 (*) 0.5 - 2.2 mmol/L  LACTIC ACID, PLASMA     Status: Abnormal   Collection Time    05/08/13 11:30 PM      Result Value Range   Lactic Acid, Venous 5.9 (*) 0.5 - 2.2 mmol/L  URINALYSIS, ROUTINE W REFLEX MICROSCOPIC     Status: Abnormal   Collection Time    05/09/13 12:07 AM      Result Value Range   Color, Urine YELLOW  YELLOW   APPearance CLEAR  CLEAR   Specific Gravity, Urine 1.015  1.005 - 1.030   pH 6.5  5.0 - 8.0   Glucose, UA NEGATIVE  NEGATIVE mg/dL   Hgb urine dipstick MODERATE (*) NEGATIVE   Bilirubin Urine NEGATIVE  NEGATIVE   Ketones, ur NEGATIVE  NEGATIVE mg/dL   Protein, ur 30 (*) NEGATIVE mg/dL   Urobilinogen, UA 0.2  0.0 - 1.0 mg/dL   Nitrite NEGATIVE  NEGATIVE   Leukocytes, UA NEGATIVE  NEGATIVE   URINE MICROSCOPIC-ADD ON     Status: None   Collection Time    05/09/13 12:07 AM      Result Value Range   Squamous Epithelial / LPF RARE  RARE   WBC, UA 0-2  <3 WBC/hpf   RBC / HPF 21-50  <3 RBC/hpf  LACTIC ACID, PLASMA     Status: Abnormal   Collection Time    05/09/13  7:15 AM      Result Value Range   Lactic Acid, Venous 6.1 (*) 0.5 - 2.2 mmol/L  GLUCOSE, CAPILLARY     Status: Abnormal   Collection Time    05/09/13  7:33 AM      Result Value Range   Glucose-Capillary 155 (*) 70 - 99 mg/dL  MRSA PCR SCREENING     Status: None   Collection Time    05/09/13 10:33 AM      Result Value Range   MRSA by PCR NEGATIVE  NEGATIVE   Comment:            The GeneXpert MRSA Assay (FDA     approved for NASAL specimens     only), is one component of a     comprehensive MRSA colonization     surveillance program. It is not     intended to diagnose MRSA     infection nor to guide or     monitor treatment for     MRSA infections.  TROPONIN I     Status: None   Collection Time    05/09/13 11:00 AM      Result Value  Range   Troponin I <0.30  <0.30 ng/mL   Comment:            Due to the release kinetics of cTnI,     a negative result within the first hours     of the onset of symptoms does not rule out     myocardial infarction with certainty.     If myocardial infarction is still suspected,     repeat the test at appropriate intervals.  CBC     Status: Abnormal   Collection Time    05/09/13 11:00 AM      Result Value Range   WBC 7.3  4.0 - 10.5 K/uL   RBC 3.33 (*) 4.22 - 5.81 MIL/uL   Hemoglobin 11.5 (*) 13.0 - 17.0 g/dL   HCT 16.1 (*) 09.6 - 04.5 %   MCV 97.6  78.0 - 100.0 fL   MCH 34.5 (*) 26.0 - 34.0 pg   MCHC 35.4  30.0 - 36.0 g/dL   RDW 40.9  81.1 - 91.4 %   Platelets 275  150 - 400 K/uL  CREATININE, SERUM     Status: Abnormal   Collection Time    05/09/13 11:00 AM      Result Value Range   Creatinine, Ser 0.78  0.50 - 1.35 mg/dL   GFR calc non Af Amer 79 (*) >90  mL/min   GFR calc Af Amer >90  >90 mL/min   Comment: (NOTE)     The eGFR has been calculated using the CKD EPI equation.     This calculation has not been validated in all clinical situations.     eGFR's persistently <90 mL/min signify possible Chronic Kidney     Disease.  URINE RAPID DRUG SCREEN (HOSP PERFORMED)     Status: None   Collection Time    05/09/13 12:23 PM      Result Value Range   Opiates NONE DETECTED  NONE DETECTED   Cocaine NONE DETECTED  NONE DETECTED   Benzodiazepines NONE DETECTED  NONE DETECTED   Amphetamines NONE DETECTED  NONE DETECTED   Tetrahydrocannabinol NONE DETECTED  NONE DETECTED   Barbiturates NONE DETECTED  NONE DETECTED   Comment:            DRUG SCREEN FOR MEDICAL PURPOSES     ONLY.  IF CONFIRMATION IS NEEDED     FOR ANY PURPOSE, NOTIFY LAB     WITHIN 5 DAYS.                LOWEST DETECTABLE LIMITS     FOR URINE DRUG SCREEN     Drug Class       Cutoff (ng/mL)     Amphetamine      1000     Barbiturate      200     Benzodiazepine   200     Tricyclics       300     Opiates          300     Cocaine          300     THC              50  GLUCOSE, CAPILLARY     Status: Abnormal   Collection Time    05/09/13 12:27 PM      Result Value Range   Glucose-Capillary 150 (*) 70 - 99 mg/dL  GLUCOSE, CAPILLARY     Status: Abnormal   Collection  Time    05/09/13  4:31 PM      Result Value Range   Glucose-Capillary 124 (*) 70 - 99 mg/dL   Comment 1 Documented in Chart     Comment 2 Notify RN    LACTIC ACID, PLASMA     Status: Abnormal   Collection Time    05/09/13  5:10 PM      Result Value Range   Lactic Acid, Venous 2.6 (*) 0.5 - 2.2 mmol/L  GLUCOSE, CAPILLARY     Status: Abnormal   Collection Time    05/09/13  9:30 PM      Result Value Range   Glucose-Capillary 107 (*) 70 - 99 mg/dL   Comment 1 Documented in Chart     Comment 2 Notify RN    CBC     Status: Abnormal   Collection Time    05/10/13  3:05 AM      Result Value Range   WBC 5.7   4.0 - 10.5 K/uL   RBC 2.84 (*) 4.22 - 5.81 MIL/uL   Hemoglobin 9.8 (*) 13.0 - 17.0 g/dL   HCT 56.2 (*) 13.0 - 86.5 %   MCV 97.5  78.0 - 100.0 fL   MCH 34.5 (*) 26.0 - 34.0 pg   MCHC 35.4  30.0 - 36.0 g/dL   RDW 78.4  69.6 - 29.5 %   Platelets 208  150 - 400 K/uL   Comment: RESULT REPEATED AND VERIFIED     DELTA CHECK NOTED     SPECIMEN CHECKED FOR CLOTS  BASIC METABOLIC PANEL     Status: Abnormal   Collection Time    05/10/13  3:05 AM      Result Value Range   Sodium 139  135 - 145 mEq/L   Potassium 2.7 (*) 3.5 - 5.1 mEq/L   Comment: DELTA CHECK NOTED     REPEATED TO VERIFY     CRITICAL RESULT CALLED TO, READ BACK BY AND VERIFIED WITH:     COBB,D RN @0417  ON 11.12.2014 BY MCREYNOLDS,B   Chloride 111  96 - 112 mEq/L   CO2 23  19 - 32 mEq/L   Glucose, Bld 57 (*) 70 - 99 mg/dL   BUN 9  6 - 23 mg/dL   Creatinine, Ser 2.84  0.50 - 1.35 mg/dL   Calcium 7.9 (*) 8.4 - 10.5 mg/dL   GFR calc non Af Amer 77 (*) >90 mL/min   GFR calc Af Amer 89 (*) >90 mL/min   Comment: (NOTE)     The eGFR has been calculated using the CKD EPI equation.     This calculation has not been validated in all clinical situations.     eGFR's persistently <90 mL/min signify possible Chronic Kidney     Disease.  LACTIC ACID, PLASMA     Status: None   Collection Time    05/10/13  3:05 AM      Result Value Range   Lactic Acid, Venous 0.8  0.5 - 2.2 mmol/L  GLUCOSE, CAPILLARY     Status: None   Collection Time    05/10/13  8:09 AM      Result Value Range   Glucose-Capillary 74  70 - 99 mg/dL   Comment 1 Documented in Chart     Comment 2 Notify RN    GLUCOSE, CAPILLARY     Status: Abnormal   Collection Time    05/10/13 11:51 AM      Result Value Range  Glucose-Capillary 66 (*) 70 - 99 mg/dL   Comment 1 Documented in Chart     Comment 2 Notify RN     Comment 3 Hypo Protocol    GLUCOSE, CAPILLARY     Status: Abnormal   Collection Time    05/10/13 12:35 PM      Result Value Range   Glucose-Capillary  116 (*) 70 - 99 mg/dL   Labs are reviewed and are pertinent for increased lactic Acid.  Current Facility-Administered Medications  Medication Dose Route Frequency Provider Last Rate Last Dose  . acetaminophen (TYLENOL) tablet 650 mg  650 mg Oral Q6H PRN Renae Fickle, MD       Or  . acetaminophen (TYLENOL) suppository 650 mg  650 mg Rectal Q6H PRN Renae Fickle, MD      . amLODipine (NORVASC) tablet 5 mg  5 mg Oral q morning - 10a Renae Fickle, MD   5 mg at 05/09/13 0936  . ARIPiprazole (ABILIFY) tablet 5 mg  5 mg Oral Daily Shanna Cisco, MD   5 mg at 05/09/13 1152  . aspirin EC tablet 81 mg  81 mg Oral Daily Shanna Cisco, MD   81 mg at 05/09/13 0936  . Cholecalciferol 1,000 Units  1,000 Units Oral q morning - 10a Renae Fickle, MD   1,000 Units at 05/09/13 1305  . clonazePAM (KLONOPIN) tablet 0.5 mg  0.5 mg Oral TID Renae Fickle, MD   0.5 mg at 05/09/13 2232  . dextrose 50 % solution           . diclofenac sodium (VOLTAREN) 1 % transdermal gel 2 g  2 g Topical BID PRN Renae Fickle, MD      . docusate sodium (COLACE) capsule 100 mg  100 mg Oral BID Renae Fickle, MD   100 mg at 05/09/13 2233  . enoxaparin (LOVENOX) injection 40 mg  40 mg Subcutaneous Q24H Renae Fickle, MD   40 mg at 05/09/13 1700  . feeding supplement (ENSURE) (ENSURE) pudding 1 Container  1 Container Oral Q24H Lorraine Lax, RD      . hydrALAZINE (APRESOLINE) injection 10 mg  10 mg Intravenous Q4H PRN Renae Fickle, MD   10 mg at 05/09/13 1317  . irbesartan (AVAPRO) tablet 300 mg  300 mg Oral Daily Renae Fickle, MD   300 mg at 05/09/13 1149   And  . hydrochlorothiazide (MICROZIDE) capsule 12.5 mg  12.5 mg Oral Daily Renae Fickle, MD   12.5 mg at 05/09/13 0936  . insulin aspart (novoLOG) injection 0-9 Units  0-9 Units Subcutaneous TID WC Renae Fickle, MD   1 Units at 05/09/13 1700  . LORazepam (ATIVAN) injection 1-2 mg  1-2 mg Intravenous Q4H PRN Renae Fickle, MD   2 mg at 05/10/13  0541  . multivitamin with minerals tablet 1 tablet  1 tablet Oral Daily Reanne Vista Lawman, RD      . ondansetron (ZOFRAN-ODT) disintegrating tablet 4 mg  4 mg Oral Q8H PRN Shanna Cisco, MD      . polyethylene glycol (MIRALAX / GLYCOLAX) packet 17 g  17 g Oral Daily PRN Renae Fickle, MD      . potassium chloride (K-DUR) CR tablet 20 mEq  20 mEq Oral Daily Renae Fickle, MD      . potassium chloride 20 MEQ/15ML (10%) liquid 20 mEq  20 mEq Oral Once Onalee Hua Tat, MD      . pyridOXINE (VITAMIN B-6) tablet 100 mg  100 mg Oral q  morning - 10a Shanna Cisco, MD   100 mg at 05/09/13 1153  . risperiDONE (RISPERDAL) 1 MG/ML oral solution 0.5 mg  0.5 mg Oral BID Leda Gauze, NP   0.5 mg at 05/09/13 2233  . senna (SENOKOT) tablet 8.6 mg  1 tablet Oral BID Renae Fickle, MD   8.6 mg at 05/09/13 2233  . sertraline (ZOLOFT) tablet 25 mg  25 mg Oral Daily Shanna Cisco, MD   25 mg at 05/09/13 1305  . sodium chloride 0.9 % 1,000 mL with potassium chloride 20 mEq infusion   Intravenous Continuous Catarina Hartshorn, MD 75 mL/hr at 05/10/13 1149    . sodium chloride 0.9 % bolus 1,000 mL  1,000 mL Intravenous Q1H PRN Renae Fickle, MD   1,000 mL at 05/10/13 0318  . sodium chloride 0.9 % injection 3 mL  3 mL Intravenous Q12H Renae Fickle, MD   3 mL at 05/09/13 2234  . traMADol (ULTRAM) tablet 50 mg  50 mg Oral Q6H PRN Shanna Cisco, MD      . vitamin C (ASCORBIC ACID) tablet 500 mg  500 mg Oral q morning - 10a Shanna Cisco, MD   500 mg at 05/09/13 1153  . zolpidem (AMBIEN) tablet 5 mg  5 mg Oral QHS Leda Gauze, NP   5 mg at 05/09/13 2233   Plan His liquid Risperdal 0.5 mg can be further increased to 3 times a day if not medically contraindicated.  Continue when necessary Ativan for severe agitation.  Discontinue Abilify. If his paranoia and behavior does not get better patient may require inpatient psychiatric services.  Please call consultation liaison services if you have any  further questions at [phone].  ARFEEN,SYED T. 05/10/2013 2:52 PM

## 2013-05-10 NOTE — Progress Notes (Signed)
Pt's wife called out that pt is vomiting, he vomited food he was feeding him; no coughing or signs of aspiration. Pt is awake and following commands to swish mouth with water and spit. Pt verbalizes that she knows not to attempt to feed him if he is not fully awake. Pt states "I'm ok, just thirsty". Pt also has blood in his urine, with small clot coming out. Dr. Arbutus Leas here on unit, and aware.

## 2013-05-10 NOTE — Progress Notes (Addendum)
[facility] PROGRESS NOTE  [name] ZOX:[id number] DOB: Feb 18, 1925 DOA: 05/08/2013 PCP: Sonda Primes, MD  Assessment/Plan: Malignant hypertensioncreatinine stable.  -Troponin neg, ECG grossly stable, and no headache.  -Bps may also be somewhat falsely elevated due to agitation, however, previous admissions, his pressures were not this high despite agitated behavior.  - transfer to telemetry - Restart home medications included, norvasc, and ARB  -d/c atenolol due to bradycardia - will not restart clonidine due to concerns for reflexive hypertension given patient's mentation and refusal to take medication. - Add prn hydralazine  - Will try to minimize blood draws and address agitation  Lactic acidosis: -May be due to dehydration as per wife, has not eaten or drank much recently  - Discontinue metformin as this may have contributed - Aggressive hydration  - Repeat lactic acid 0.8 Agitation due to dementia  - QTc prolonged so will try to minimize QTc prolonging medications where possible  - Continue abilify  - Schedule clonazepam  - Psychiatry consultation appreciated  - Sitter at bedside  - No evidence of PNA or UTI  - Okay to stop donepezil per wife's request  -Continue Risperdal liquid 0.5 mg twice a day -Transfer to [facility] if no improvement Depression and anxiety with paranoid features  - Continue sertraline  - Psychiatry assistance appreciated  T2DM  - Hold glipizide and metformin  - Start low dose SSI -Hemoglobin A1c 6.1 on 03/09/2013 Hypokalemia -Replete -Check magnesium Family Communication:   Pt at beside Disposition Plan:   SNF vs [facility]       Procedures/Studies: Dg Chest 2 View  04/24/2013   CLINICAL DATA:  Abdominal pain.  Emesis.  EXAM: CHEST  2 VIEW  COMPARISON:  The 04/11/2013  FINDINGS: Chronic cardiomegaly. Convexity of the upper right mediastinum which is also stable over multiple years. No edema, asymmetric opacity, definite/significant  effusion, or pneumothorax.  IMPRESSION: No active cardiopulmonary disease.   Electronically Signed   By: Tiburcio Pea M.D.   On: 04/24/2013 01:04   Dg Hip Complete Left  04/23/2013   CLINICAL DATA:  Fall with left hip pain  EXAM: LEFT HIP - COMPLETE 2+ VIEW  COMPARISON:  10/05/2007 hip x-ray  FINDINGS: There is no evidence of hip fracture or dislocation. There is no evidence of inflammatory arthropathy or other focal bone abnormality. For age, degenerative hip narrowing bilaterally is mild. Extensive arterial calcification.  IMPRESSION: No evidence of acute osseous injury.   Electronically Signed   By: Tiburcio Pea M.D.   On: 04/23/2013 23:09   Ct Head Wo Contrast  04/22/2013   CLINICAL DATA:  Patient fell with bump to occiput and headache this past week  EXAM: CT HEAD WITHOUT CONTRAST  CT CERVICAL SPINE WITHOUT CONTRAST  TECHNIQUE: Multidetector CT imaging of the head and cervical spine was performed following the standard protocol without intravenous contrast. Multiplanar CT image reconstructions of the cervical spine were also generated.  COMPARISON:  Head CT 12/18/2012  FINDINGS: CT HEAD FINDINGS  Diffuse atrophy and low attenuation in the deep white matter. No change in this process. No vascular territory infarct, hemorrhage, or extra-axial fluid. Calvarium is intact.  CT CERVICAL SPINE FINDINGS  Normal alignment. No prevertebral soft tissue swelling. Multilevel degenerative disc disease. No fractures.  IMPRESSION: No acute intracranial abnormality. No acute traumatic injury involving the cervical spine.   Electronically Signed   By: Esperanza Heir M.D.   On: 04/22/2013 13:15   Ct Cervical Spine Wo Contrast  04/22/2013   CLINICAL  DATA:  Patient fell with bump to occiput and headache this past week  EXAM: CT HEAD WITHOUT CONTRAST  CT CERVICAL SPINE WITHOUT CONTRAST  TECHNIQUE: Multidetector CT imaging of the head and cervical spine was performed following the standard protocol without  intravenous contrast. Multiplanar CT image reconstructions of the cervical spine were also generated.  COMPARISON:  Head CT 12/18/2012  FINDINGS: CT HEAD FINDINGS  Diffuse atrophy and low attenuation in the deep white matter. No change in this process. No vascular territory infarct, hemorrhage, or extra-axial fluid. Calvarium is intact.  CT CERVICAL SPINE FINDINGS  Normal alignment. No prevertebral soft tissue swelling. Multilevel degenerative disc disease. No fractures.  IMPRESSION: No acute intracranial abnormality. No acute traumatic injury involving the cervical spine.   Electronically Signed   By: Esperanza Heir M.D.   On: 04/22/2013 13:15   Ct Lumbar Spine Wo Contrast  04/22/2013   CLINICAL DATA:  Recent fall, fell earlier this week, with lumbar spine pain  EXAM: CT LUMBAR SPINE WITHOUT CONTRAST  TECHNIQUE: Multidetector CT imaging of the lumbar spine was performed without intravenous contrast administration. Multiplanar CT image reconstructions were also generated.  COMPARISON:  MRI 06/19/2009, CT scan 04/06/2012  FINDINGS: There is mild convex left scoliosis of the lower lumbar spine. L5-S1, L4-5, and L3-4 show severe degenerative disc disease. There is moderate L2-3 and mild L1-2 degenerative disc disease. No fractures are identified. There is evidence of prior lumbar spine surgery with left L5 posterior decompression via laminectomy. There is severe L5-S1 degenerative facet change. There is moderate L4-5 and L3-4 degenerative facet change. Milder degenerative facet changes seen throughout the remainder of the lumbar spine.  IMPRESSION: Significant chronic degenerative change. No acute traumatic injury.   Electronically Signed   By: Esperanza Heir M.D.   On: 04/22/2013 14:22   Ct Abdomen Pelvis W Contrast  04/24/2013   CLINICAL DATA:  Back and leg pain. Previous lumbar surgery.  EXAM: CT ABDOMEN AND PELVIS WITH CONTRAST  TECHNIQUE: Multidetector CT imaging of the abdomen and pelvis was performed  using the standard protocol following bolus administration of intravenous contrast.  CONTRAST:  50mL OMNIPAQUE IOHEXOL 300 MG/ML SOLN, OMNIPAQUE IOHEXOL 300 MG/ML SOLN  COMPARISON:  Previous day's exam and earlier studies  FINDINGS: Dependent atelectasis in the visualized lung bases. Extensive coronary calcifications. Extensive aortoiliac calcified plaque, involving visceral and renal branches. Unremarkable liver, gallbladder, spleen, adrenal glands, pancreas. Small bilateral renal cysts. No hydronephrosis. Small hiatal hernia. Stomach physiologically distended. Small bowel and colon nondilated. Innumerable descending and sigmoid diverticula. Urinary bladder physiologically distended. Moderate prostatic enlargement with central coarse calcifications. No ascites. No free air. No adenopathy localized. Multilevel spondylitic changes in the lower thoracic and lower lumbar spine.  IMPRESSION: 1. No acute abdominal process. 2. Sigmoid diverticulosis. 3. Hiatal hernia. 4. Atherosclerosis, including aortoiliac, renal, visceral, and coronary artery disease. Please note that although the presence of coronary artery calcium documents the presence of coronary artery disease, the severity of this disease and any potential stenosis cannot be assessed on this non-gated CT examination. Assessment for potential risk factor modification, dietary therapy or pharmacologic therapy may be warranted, if clinically indicated.   Electronically Signed   By: Oley Balm M.D.   On: 04/24/2013 00:16   Dg Chest Port 1 View  05/09/2013   CLINICAL DATA:  Increased agitation.  EXAM: PORTABLE CHEST - 1 VIEW  COMPARISON:  04/24/2013  FINDINGS: Heart size and vascularity are normal. Right peritracheal soft tissue density stable and most likely  is vascular ectasia.  Lungs are clear without infiltrate or effusion.  IMPRESSION: No active disease.   Electronically Signed   By: Marlan Palau M.D.   On: 05/09/2013 12:01   Dg Chest Port 1  View  04/11/2013   CLINICAL DATA:  Hypertensive diabetic with altered mental status.  EXAM: PORTABLE CHEST - 1 VIEW  COMPARISON:  The 12/18/2012.  FINDINGS: Central pulmonary vascular prominence without frank pulmonary edema. No segmental infiltrate or gross pneumothorax.  Heart size within normal limits.  Calcified aorta.  IMPRESSION: No acute abnormality. Please see above.   Electronically Signed   By: Bridgett Larsson M.D.   On: 04/11/2013 09:16         Subjective: Patient is pleasantly confused. Denies any headache, chest pain, shortness breath, abdominal pain. No reports of respiratory distress, vomiting, diarrhea.  Objective: Filed Vitals:   05/10/13 0400 05/10/13 0500 05/10/13 0600 05/10/13 0700  BP: 115/45 151/51 148/52 126/20  Pulse: 47 51 48 49  Temp: 97.9 F (36.6 C)     TempSrc: Axillary     Resp: 16 15 18 14   Height:      Weight: 59.6 kg (131 lb 6.3 oz)     SpO2: 100% 100% 98% 99%    Intake/Output Summary (Last 24 hours) at 05/10/13 0833 Last data filed at 05/10/13 0500  Gross per 24 hour  Intake 5527.5 ml  Output    100 ml  Net 5427.5 ml   Weight change:  Exam:   General:  Pt is alert, follows commands appropriately, not in acute distress  HEENT: No icterus, No thrush, No neck mass, [place]/AT; no meningismus  Cardiovascular: RRR, S1/S2, no rubs, no gallops  Respiratory: Poor respiratory effort but clear to auscultation.  Abdomen: Soft/+BS, non tender, non distended, no guarding  Extremities: No edema, No lymphangitis, No petechiae, No rashes, no synovitis  Data Reviewed: Basic Metabolic Panel:  Recent Labs Lab 05/08/13 1911 05/09/13 1100 05/10/13 0305  NA 141  --  139  K 3.9  --  2.7*  CL 104  --  111  CO2 22  --  23  GLUCOSE 148*  --  57*  BUN 17  --  9  CREATININE 0.88 0.78 0.84  CALCIUM 10.1  --  7.9*   Liver Function Tests: No results found for this basename: AST, ALT, ALKPHOS, BILITOT, PROT, ALBUMIN,  in the last 168 hours No results  found for this basename: LIPASE, AMYLASE,  in the last 168 hours No results found for this basename: AMMONIA,  in the last 168 hours CBC:  Recent Labs Lab 05/08/13 1911 05/09/13 1100 05/10/13 0305  WBC 10.0 7.3 5.7  NEUTROABS 6.0  --   --   HGB 12.8* 11.5* 9.8*  HCT 37.0* 32.5* 27.7*  MCV 98.1 97.6 97.5  PLT 314 275 208   Cardiac Enzymes:  Recent Labs Lab 05/09/13 1100  TROPONINI <0.30   BNP: No components found with this basename: POCBNP,  CBG:  Recent Labs Lab 05/09/13 0733 05/09/13 1227 05/09/13 1631 05/09/13 2130  GLUCAP 155* 150* 124* 107*    Recent Results (from the past 240 hour(s))  MRSA PCR SCREENING     Status: None   Collection Time    05/09/13 10:33 AM      Result Value Range Status   MRSA by PCR NEGATIVE  NEGATIVE Final   Comment:            The GeneXpert MRSA Assay (FDA  approved for NASAL specimens     only), is one component of a     comprehensive MRSA colonization     surveillance program. It is not     intended to diagnose MRSA     infection nor to guide or     monitor treatment for     MRSA infections.     Scheduled Meds: . amLODipine  5 mg Oral q morning - 10a  . ARIPiprazole  5 mg Oral Daily  . aspirin EC  81 mg Oral Daily  . Cholecalciferol  1,000 Units Oral q morning - 10a  . clonazePAM  0.5 mg Oral TID  . docusate sodium  100 mg Oral BID  . enoxaparin (LOVENOX) injection  40 mg Subcutaneous Q24H  . irbesartan  300 mg Oral Daily   And  . hydrochlorothiazide  12.5 mg Oral Daily  . insulin aspart  0-9 Units Subcutaneous TID WC  . potassium chloride  20 mEq Oral Daily  . potassium chloride  10 mEq Intravenous Q1 Hr x 4  . potassium chloride  20 mEq Oral Once  . pyridOXINE  100 mg Oral q morning - 10a  . risperiDONE  0.5 mg Oral BID  . senna  1 tablet Oral BID  . sertraline  25 mg Oral Daily  . sodium chloride  3 mL Intravenous Q12H  . vitamin C  500 mg Oral q morning - 10a  . zolpidem  5 mg Oral QHS   Continuous  Infusions: . sodium chloride 0.9 % 1,000 mL with potassium chloride 20 mEq infusion       Marthe Dant, DO  [facility] Pager [phone]  If 7PM-7AM, please contact night-coverage [email or web address] Password [facility] 05/10/2013, 8:33 AM   LOS: 2 days

## 2013-05-10 NOTE — Progress Notes (Addendum)
INITIAL NUTRITION ASSESSMENT  DOCUMENTATION CODES Per approved criteria  -Not Applicable   INTERVENTION: Recommend advancing diet to Regular Provide Magic Cup BID Provide Ensure Pudding once daily Provide Multivitamin with minerals daily  NUTRITION DIAGNOSIS: Inadequate oral intake related to poor appetite as evidenced by 7% wt loss and PO intake <50% of meals per pt's wife.   Goal: Pt to meet >/= 90% of their estimated nutrition needs   Monitor:  PO intake Weight Labs  Reason for Assessment: Malnutrition Screening Tool, score of 3  79 y.o. male  Admitting Dx: Malignant hypertension  ASSESSMENT: 79 y.o. year-old male with history of HTN, HLD, T2DM with peripheral neuropathy, progressive dementia with agitation and violent behaviors who currently lives at home with his wife who presents with increasing agitation.   Pt asleep at time of visit. Discussed pt with pt's wife ate bedside who reports pt was given Ativan early this morning and has been asleep all morning. Per wife, pt has had a poor appetite for over a month and has been eating <50% of what he normally eats. Pt's usual body weight is 135 lbs; per weight history pt's weight dropped to 125 lbs (04/24/13)- 7% wt loss. Wife states pt has been drinking milkshakes well for her; pt likes milkshakes, ice cream, pancakes, hamburgers, and mashed potatoes. Wife states that pt usually eats a regular diet and she denies chewing/swallowing difficulty; wife requesting diet be changed from Dysphagia 2 to Regular to improve pt's acceptance of food.  Pt has a history of Vitamin B12 deficiency; per chart pt receives Vitam B12 injections every 14 days.   Height: Ht Readings from Last 1 Encounters:  05/09/13 5\' 3"  (1.6 m)    Weight: Wt Readings from Last 1 Encounters:  05/10/13 131 lb 6.3 oz (59.6 kg)  Weight on admission: 128 lbs  Ideal Body Weight: 124 lbs  % Ideal Body Weight: 106%  Wt Readings from Last 10 Encounters:   05/10/13 131 lb 6.3 oz (59.6 kg)  04/24/13 125 lb 11.2 oz (57.017 kg)  03/31/13 135 lb (61.236 kg)  03/14/13 133 lb (60.328 kg)  12/18/12 130 lb 4.7 oz (59.1 kg)  12/09/12 132 lb (59.875 kg)  10/07/12 134 lb (60.782 kg)  09/29/12 144 lb 13.5 oz (65.7 kg)  09/23/12 136 lb (61.689 kg)  09/20/12 136 lb (61.689 kg)    Usual Body Weight: 135 lbs  % Usual Body Weight: 95%  BMI:  Body mass index is 23.28 kg/(m^2).  Estimated Nutritional Needs: Kcal: 1500-1700 Protein: 70-80 grams Fluid: 1.7 L/day  Skin: intact  Diet Order: Dysphagia  EDUCATION NEEDS: -No education needs identified at this time   Intake/Output Summary (Last 24 hours) at 05/10/13 1129 Last data filed at 05/10/13 0500  Gross per 24 hour  Intake 4527.5 ml  Output    100 ml  Net 4427.5 ml    Last BM: PTA   Labs:   Recent Labs Lab 05/08/13 1911 05/09/13 1100 05/10/13 0305  NA 141  --  139  K 3.9  --  2.7*  CL 104  --  111  CO2 22  --  23  BUN 17  --  9  CREATININE 0.88 0.78 0.84  CALCIUM 10.1  --  7.9*  GLUCOSE 148*  --  57*    CBG (last 3)   Recent Labs  05/09/13 1631 05/09/13 2130 05/10/13 0809  GLUCAP 124* 107* 74    Scheduled Meds: . amLODipine  5 mg Oral q morning -  10a  . ARIPiprazole  5 mg Oral Daily  . aspirin EC  81 mg Oral Daily  . Cholecalciferol  1,000 Units Oral q morning - 10a  . clonazePAM  0.5 mg Oral TID  . docusate sodium  100 mg Oral BID  . enoxaparin (LOVENOX) injection  40 mg Subcutaneous Q24H  . irbesartan  300 mg Oral Daily   And  . hydrochlorothiazide  12.5 mg Oral Daily  . insulin aspart  0-9 Units Subcutaneous TID WC  . potassium chloride  20 mEq Oral Daily  . potassium chloride  10 mEq Intravenous Q1 Hr x 4  . potassium chloride  20 mEq Oral Once  . pyridOXINE  100 mg Oral q morning - 10a  . risperiDONE  0.5 mg Oral BID  . senna  1 tablet Oral BID  . sertraline  25 mg Oral Daily  . sodium chloride  3 mL Intravenous Q12H  . vitamin C  500 mg Oral  q morning - 10a  . zolpidem  5 mg Oral QHS    Continuous Infusions: . sodium chloride 0.9 % 1,000 mL with potassium chloride 20 mEq infusion      Past Medical History  Diagnosis Date  . Carotid artery stenosis   . Gait disturbance   . Anxiety   . GERD (gastroesophageal reflux disease)   . Vitamin B12 deficiency   . Depression   . Osteoarthritis   . Diabetes mellitus, type 2   . Hypertension   . Vitamin D deficiency   . Tremor   . Aortic stenosis     murmur  . Hyperlipidemia   . Diverticulosis   . Adenomatous polyp of colon   . Memory loss   . Polyneuropathy in diabetes(357.2)   . Dementia   . Dysrhythmia   . BPH (benign prostatic hyperplasia)     Past Surgical History  Procedure Laterality Date  . Vasectomy    . Lumbar laminectomy      L5 Dr Ophelia Charter  . Transurethral resection of prostate  March 2007  . Cataract extraction, bilateral Bilateral   . Tonsillectomy and adenoidectomy    . Left foot      fracture of left foot    Ian Malkin RD, LDN Inpatient Clinical Dietitian Pager: [phone] After Hours Pager: [phone]
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
## 2013-05-10 NOTE — Progress Notes (Signed)
OT Cancellation Note  Patient Details Name: [name] MRN: [id number] DOB: [birth year]   Cancelled Treatment:    Reason Eval/Treat Not Completed: Fatigue/lethargy limiting ability to participate  Alba Cory 05/10/2013, 11:58 AM

## 2013-05-10 NOTE — Progress Notes (Signed)
RN paged secondary to pt receiving prn bolus for lowered BP and HR in the 30-50 range. NP to bedside. Pt unable to participate in ROS secondary to dementia. Tele shows 1st degree heart block. HR up into the 50s. IV NS bolus going for low BP. Holding BP meds and BB for now. Cont to follow.  Jimmye Norman, NP [facility]

## 2013-05-10 NOTE — Progress Notes (Signed)
CRITICAL VALUE ALERT  Critical value received:  K=2.7  Date of notification:  05/10/13  Time of notification:  04:45  Critical value read back:yes  Nurse who received alert:  Guinevere Scarlet, RN  MD notified (1st page):  [facility], Donnamarie Poag, NP  Time of first page:  04:45  MD notified (2nd page):  Time of second page:  Responding MD:  Revonda Humphrey, NP  Time MD responded:  04:45

## 2013-05-11 DIAGNOSIS — F329 Major depressive disorder, single episode, unspecified: Secondary | ICD-10-CM

## 2013-05-11 DIAGNOSIS — E119 Type 2 diabetes mellitus without complications: Secondary | ICD-10-CM

## 2013-05-11 LAB — BASIC METABOLIC PANEL
BUN: 7 mg/dL (ref 6–23)
CO2: 21 mEq/L (ref 19–32)
Calcium: 8.6 mg/dL (ref 8.4–10.5)
GFR calc non Af Amer: 76 mL/min — ABNORMAL LOW (ref >90)
Glucose, Bld: 95 mg/dL (ref 70–99)
Sodium: 143 mEq/L (ref 135–145)

## 2013-05-11 LAB — GLUCOSE, CAPILLARY
Glucose-Capillary: 96 mg/dL (ref 70–99)
Glucose-Capillary: 173 mg/dL — ABNORMAL HIGH (ref 70–99)
Glucose-Capillary: 139 mg/dL — ABNORMAL HIGH (ref 70–99)

## 2013-05-11 LAB — MAGNESIUM: Magnesium: 1.6 mg/dL (ref 1.5–2.5)

## 2013-05-11 MED ORDER — LORAZEPAM 2 MG/ML IJ SOLN: 2.0000 mg | Freq: Once | INTRAMUSCULAR | Status: DC

## 2013-05-11 MED ORDER — AMLODIPINE BESYLATE 10 MG PO TABS
10.0000 mg | ORAL_TABLET | Freq: Every morning | ORAL | Status: DC
  Filled 2013-05-11: qty 1

## 2013-05-11 MED ORDER — DONEPEZIL HCL 5 MG PO TABS
5.0000 mg | ORAL_TABLET | Freq: Every day | ORAL | Status: DC
  Administered 2013-05-11: 23:00:00 5 mg via ORAL
  Filled 2013-05-11 (×2): qty 1

## 2013-05-11 MED ORDER — POTASSIUM CHLORIDE CRYS ER 20 MEQ PO TBCR
40.0000 meq | EXTENDED_RELEASE_TABLET | Freq: Once | ORAL | Status: AC
  Administered 2013-05-11: 12:00:00 40 meq via ORAL
  Filled 2013-05-11: qty 2
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
## 2013-05-11 NOTE — Progress Notes (Signed)
Clinical Social Work Department CLINICAL SOCIAL WORK PSYCHIATRY SERVICE LINE ASSESSMENT 05/11/2013  Patient:  [name] [facility]  Account:  [id number]  Admit Date:  05/08/2013  Clinical Social Worker:  Unk Lightning, LCSW  Date/Time:  05/11/2013 04:00 PM Referred by:  Physician  Date referred:  05/11/2013 Reason for Referral  Psychosocial assessment   Presenting Symptoms/Problems (In the person's/family's own words):   Psych consulted due to dementia   Abuse/Neglect/Trauma History (check all that apply)  Denies history   Abuse/Neglect/Trauma Comments:   Psychiatric History (check all that apply)  Outpatient treatment   Psychiatric medications:  Klonopin 0.5 mg  Ativan 2 mg  Risperdal 1 mg  Zoloft 25 mg   Current Mental Health Hospitalizations/Previous Mental Health History:   Patient has a neurologist that prescribes psychotropic medications for behaviors related to dementia.   Current provider:   Dr. Anne Hahn   Place and Date:   [place], [place]   Current Medications:   Scheduled Meds:      . [START ON 05/12/2013] amLODipine  10 mg Oral q morning - 10a  . Cholecalciferol  1,000 Units Oral q morning - 10a  . clonazePAM  0.5 mg Oral TID  . docusate sodium  100 mg Oral BID  . donepezil  5 mg Oral QHS  . feeding supplement (ENSURE)  1 Container Oral Q24H  . irbesartan  300 mg Oral Daily   And     . hydrochlorothiazide  12.5 mg Oral Daily  . insulin aspart  0-9 Units Subcutaneous TID WC  . LORazepam  2 mg Intramuscular Once  . multivitamin with minerals  1 tablet Oral Daily  . potassium chloride  20 mEq Oral Daily  . potassium chloride  20 mEq Oral Once  . pyridOXINE  100 mg Oral q morning - 10a  . risperiDONE  0.5 mg Oral BID  . senna  1 tablet Oral BID  . sertraline  25 mg Oral Daily  . sodium chloride  3 mL Intravenous Q12H  . vitamin C  500 mg Oral q morning - 10a  . zolpidem  5 mg Oral QHS        Continuous Infusions:      . sodium chloride 0.9 % 1,000 mL with  potassium chloride 20 mEq infusion 75 mL/hr at 05/11/13 0242          PRN Meds:.acetaminophen, acetaminophen, diclofenac sodium, hydrALAZINE, LORazepam, ondansetron, polyethylene glycol, sodium chloride, traMADol       Previous Impatient Admission/Date/Reason:   Patient recently DC from [place].   Emotional Health / Current Symptoms    Suicide/Self Harm  None reported   Suicide attempt in the past:   Patient not reporting any SI or HI at this time.   Other harmful behavior:   None reported   Psychotic/Dissociative Symptoms  Paranoia   Other Psychotic/Dissociative Symptoms:   Wife reports that patient is paranoid and often accuses her of stealing money and lying to him.    Attention/Behavioral Symptoms  Physicial aggression  Verbal aggression   Other Attention / Behavioral Symptoms:   Patient combative towards staff.    Cognitive Impairment  Orientation - Self   Other Cognitive Impairment:    Mood and Adjustment  Labile    Stress, Anxiety, Trauma, Any Recent Loss/Stressor  None reported   Anxiety (frequency):   N/A   Phobia (specify):   N/A   Compulsive behavior (specify):   N/A   Obsessive behavior (specify):   N/A   Other:  N/A   Substance Abuse/Use  None   SBIRT completed (please refer for detailed history):  N  Self-reported substance use:   Patient does not use any substances.   Urinary Drug Screen Completed:  Y Alcohol level:   N/A    Environmental/Housing/Living Arrangement  Stable housing   Who is in the home:   Wife   Emergency contact:  Faye-wife   Financial  Medicare   Patient's Strengths and Goals (patient's own words):   Patient has supportive family.   Clinical Social Worker's Interpretive Summary:   CSW received referral to complete psychosocial assessment. CSW reviewed chart and met with wife in hallway. Patient currently agitated and requiring assistance from nursing staff.    Wife reports that patient was  recently at [facility] and that she feels like patient's behaviors are worse since being DC from the hospital. Wife reports that patient has been combative and aggressive for several months but that aggression has increased.    When patient was at [facility], [employer] came to evaluate patient and wife has continued to be in contact with patient. Wife continues to desire placement at [facility] and feels that she needs additional help.    Wife reports that patient has been non compliant with medications recently and is worried that recent medication changes are not effective. Wife continues to report that she does not want patient to be placed at a psych hospital again. CSW discussed case with MD who reports he will order PMT to complete GOC meeting with family.    CSW will continue to follow.   Disposition:  Recommend Psych CSW continuing to support while in hospital  [place], [place] [phone]

## 2013-05-11 NOTE — Plan of Care (Signed)
Problem: Phase III Progression Outcomes Goal: Voiding independently Outcome: Progressing incontinent.

## 2013-05-11 NOTE — Progress Notes (Signed)
From 3pm Patient became agitated and combative kicking and hitting staff, gave 1mg  of IV ativan not effective, tried to give the 2nd dose patient disconnected IV, then IV started leaking. will not take his medication and will not let any one touch him to calm him down Notified Dr. Arbutus Leas, at the bedside talking to patient's wife. Will continue to assess patient.

## 2013-05-11 NOTE — Progress Notes (Signed)
Palliative Medicine Team consult for goals of care discussion received; meeting scheduled by PMT provider Lorinda Creed, NP for tomorrow Friday 05/12/13 @ 9:00 am   Valente David, RN 05/11/2013, 4:57 PM Palliative Medicine Team RN Liaison [phone]

## 2013-05-11 NOTE — Progress Notes (Signed)
Clinical Social Work  CSW met with wife at bedside who is interested in placement at [facility]. Wife has spoken to [place] at ALF and reports they have evaluated patient in the past. CSW received permission to contact ALF. CSW spoke with [facility] who is agreeable to review referral. Information faxed. CSW also submitted for pasarr after staffing case with [facility].  CSW provided wife with SNF list and explained alternative options regarding placement. Wife would be interested in [facility] or [address] as well. CSW left contact information and will continue to follow.  CSW completed psychosocial assessment with wife as well. CSW will complete assessment and place in chart at later time.  [place], [place] [phone]

## 2013-05-11 NOTE — Progress Notes (Addendum)
Clinical Social Work Department CLINICAL SOCIAL WORK PLACEMENT NOTE 05/11/2013  Patient:  [name], [name]  Account Number:  [id number] Admit date:  05/08/2013  Clinical Social Worker:  Unk Lightning, LCSW  Date/time:  05/11/2013 04:00 PM  Clinical Social Work is seeking post-discharge placement for this patient at the following level of care:   SKILLED NURSING   (*CSW will update this form in Epic as items are completed)   05/11/2013  Patient/family provided with [facility] Department of Clinical Social Work's list of facilities offering this level of care within the geographic area requested by the patient (or if unable, by the patient's family).  05/11/2013  Patient/family informed of their freedom to choose among providers that offer the needed level of care, that participate in Medicare, Medicaid or managed care program needed by the patient, have an available bed and are willing to accept the patient.  05/11/2013  Patient/family informed of [facility]' ownership interest in [facility], as well as of the fact that they are under no obligation to receive care at this facility.  PASARR submitted to EDS on 05/11/2013 PASARR number received from EDS on   FL2 transmitted to all facilities in geographic area requested by pt/family on  05/11/2013 FL2 transmitted to all facilities within larger geographic area on   Patient informed that his/her managed care company has contracts with or will negotiate with  certain facilities, including the following:     Patient/family informed of bed offers received:  05/11/2013 Patient chooses bed at [facility] memory care unit Physician recommends and patient chooses bed at    Patient to be transferred to  on  [facility] memory care unit on 05/15/2013 Patient to be transferred to facility by ambulance Sharin Mons)  The following physician request were entered in Epic:   Additional Comments:

## 2013-05-11 NOTE — Progress Notes (Signed)
[facility] PROGRESS NOTE  [name] ZOX:[id number] DOB: Nov 23, 1924 DOA: 05/08/2013 PCP: Sonda Primes, MD  Assessment/Plan: Malignant hypertension -creatinine stable.  -Troponin neg, ECG grossly stable, and no headache.  -Bps may also be somewhat falsely elevated due to agitation, however, previous admissions, his pressures were not this high despite agitated behavior.  - Restart home medications included, norvasc, and ARB -increase amlodipine to 10mg  daily  -d/c atenolol due to bradycardia  - will not restart clonidine due to concerns for reflexive hypertension given patient's mentation and refusal to take medication.  - Add prn hydralazine  - Will try to minimize blood draws and address agitation  Lactic acidosis:  -May be due to dehydration as per wife, has not eaten or drank much recently  - Discontinue metformin as this may have contributed  - Aggressive hydration  - Repeat lactic acid 0.8  Agitation due to dementia  - QTc prolonged so will try to minimize QTc prolonging medications where possible  - d/c abilify per psychiatry recommendations - Schedule clonazepam  - Psychiatry consultation appreciated  - consult palliative medicine for goals of care due to multiple hospital and [facility] admissions - No evidence of PNA or UTI  - restart aricept -Continue Risperdal liquid 0.5 mg twice a day  -Transfer to [facility] if no improvement  -Check serum B12, RBC folate, ammonia -TSH 1.240 Depression and anxiety with paranoid features  - Continue sertraline  - Psychiatry assistance appreciated  T2DM  - Hold glipizide and metformin  - Start low dose SSI  -Hemoglobin A1c 6.1 on 03/09/2013  Hypokalemia  -Replete  -Check magnesium--1.6 Family Communication: Pt at beside  Disposition Plan: SNF vs [facility]          Procedures/Studies: Dg Chest 2 View  04/24/2013   CLINICAL DATA:  Abdominal pain.  Emesis.  EXAM: CHEST  2 VIEW  COMPARISON:  The 04/11/2013  FINDINGS: Chronic  cardiomegaly. Convexity of the upper right mediastinum which is also stable over multiple years. No edema, asymmetric opacity, definite/significant effusion, or pneumothorax.  IMPRESSION: No active cardiopulmonary disease.   Electronically Signed   By: Tiburcio Pea M.D.   On: 04/24/2013 01:04   Dg Hip Complete Left  04/23/2013   CLINICAL DATA:  Fall with left hip pain  EXAM: LEFT HIP - COMPLETE 2+ VIEW  COMPARISON:  10/05/2007 hip x-ray  FINDINGS: There is no evidence of hip fracture or dislocation. There is no evidence of inflammatory arthropathy or other focal bone abnormality. For age, degenerative hip narrowing bilaterally is mild. Extensive arterial calcification.  IMPRESSION: No evidence of acute osseous injury.   Electronically Signed   By: Tiburcio Pea M.D.   On: 04/23/2013 23:09   Ct Head Wo Contrast  04/22/2013   CLINICAL DATA:  Patient fell with bump to occiput and headache this past week  EXAM: CT HEAD WITHOUT CONTRAST  CT CERVICAL SPINE WITHOUT CONTRAST  TECHNIQUE: Multidetector CT imaging of the head and cervical spine was performed following the standard protocol without intravenous contrast. Multiplanar CT image reconstructions of the cervical spine were also generated.  COMPARISON:  Head CT 12/18/2012  FINDINGS: CT HEAD FINDINGS  Diffuse atrophy and low attenuation in the deep white matter. No change in this process. No vascular territory infarct, hemorrhage, or extra-axial fluid. Calvarium is intact.  CT CERVICAL SPINE FINDINGS  Normal alignment. No prevertebral soft tissue swelling. Multilevel degenerative disc disease. No fractures.  IMPRESSION: No acute intracranial abnormality. No acute traumatic injury involving the cervical spine.  Electronically Signed   By: Esperanza Heir M.D.   On: 04/22/2013 13:15   Ct Cervical Spine Wo Contrast  04/22/2013   CLINICAL DATA:  Patient fell with bump to occiput and headache this past week  EXAM: CT HEAD WITHOUT CONTRAST  CT CERVICAL SPINE  WITHOUT CONTRAST  TECHNIQUE: Multidetector CT imaging of the head and cervical spine was performed following the standard protocol without intravenous contrast. Multiplanar CT image reconstructions of the cervical spine were also generated.  COMPARISON:  Head CT 12/18/2012  FINDINGS: CT HEAD FINDINGS  Diffuse atrophy and low attenuation in the deep white matter. No change in this process. No vascular territory infarct, hemorrhage, or extra-axial fluid. Calvarium is intact.  CT CERVICAL SPINE FINDINGS  Normal alignment. No prevertebral soft tissue swelling. Multilevel degenerative disc disease. No fractures.  IMPRESSION: No acute intracranial abnormality. No acute traumatic injury involving the cervical spine.   Electronically Signed   By: Esperanza Heir M.D.   On: 04/22/2013 13:15   Ct Lumbar Spine Wo Contrast  04/22/2013   CLINICAL DATA:  Recent fall, fell earlier this week, with lumbar spine pain  EXAM: CT LUMBAR SPINE WITHOUT CONTRAST  TECHNIQUE: Multidetector CT imaging of the lumbar spine was performed without intravenous contrast administration. Multiplanar CT image reconstructions were also generated.  COMPARISON:  MRI 06/19/2009, CT scan 04/06/2012  FINDINGS: There is mild convex left scoliosis of the lower lumbar spine. L5-S1, L4-5, and L3-4 show severe degenerative disc disease. There is moderate L2-3 and mild L1-2 degenerative disc disease. No fractures are identified. There is evidence of prior lumbar spine surgery with left L5 posterior decompression via laminectomy. There is severe L5-S1 degenerative facet change. There is moderate L4-5 and L3-4 degenerative facet change. Milder degenerative facet changes seen throughout the remainder of the lumbar spine.  IMPRESSION: Significant chronic degenerative change. No acute traumatic injury.   Electronically Signed   By: Esperanza Heir M.D.   On: 04/22/2013 14:22   Ct Abdomen Pelvis W Contrast  04/24/2013   CLINICAL DATA:  Back and leg pain.  Previous lumbar surgery.  EXAM: CT ABDOMEN AND PELVIS WITH CONTRAST  TECHNIQUE: Multidetector CT imaging of the abdomen and pelvis was performed using the standard protocol following bolus administration of intravenous contrast.  CONTRAST:  50mL OMNIPAQUE IOHEXOL 300 MG/ML SOLN, OMNIPAQUE IOHEXOL 300 MG/ML SOLN  COMPARISON:  Previous day's exam and earlier studies  FINDINGS: Dependent atelectasis in the visualized lung bases. Extensive coronary calcifications. Extensive aortoiliac calcified plaque, involving visceral and renal branches. Unremarkable liver, gallbladder, spleen, adrenal glands, pancreas. Small bilateral renal cysts. No hydronephrosis. Small hiatal hernia. Stomach physiologically distended. Small bowel and colon nondilated. Innumerable descending and sigmoid diverticula. Urinary bladder physiologically distended. Moderate prostatic enlargement with central coarse calcifications. No ascites. No free air. No adenopathy localized. Multilevel spondylitic changes in the lower thoracic and lower lumbar spine.  IMPRESSION: 1. No acute abdominal process. 2. Sigmoid diverticulosis. 3. Hiatal hernia. 4. Atherosclerosis, including aortoiliac, renal, visceral, and coronary artery disease. Please note that although the presence of coronary artery calcium documents the presence of coronary artery disease, the severity of this disease and any potential stenosis cannot be assessed on this non-gated CT examination. Assessment for potential risk factor modification, dietary therapy or pharmacologic therapy may be warranted, if clinically indicated.   Electronically Signed   By: Oley Balm M.D.   On: 04/24/2013 00:16   Dg Chest Port 1 View  05/09/2013   CLINICAL DATA:  Increased agitation.  EXAM:  PORTABLE CHEST - 1 VIEW  COMPARISON:  04/24/2013  FINDINGS: Heart size and vascularity are normal. Right peritracheal soft tissue density stable and most likely is vascular ectasia.  Lungs are clear without  infiltrate or effusion.  IMPRESSION: No active disease.   Electronically Signed   By: Marlan Palau M.D.   On: 05/09/2013 12:01         Subjective: Patient continues to have intermittent agitation and aggressive behavior. He is pleasantly confused. Denies any headache, chest pain, shortness breath, abdominal pain, fevers, chills. No reports of respiratory distress or vomiting.  Objective: Filed Vitals:   05/10/13 2100 05/11/13 0541 05/11/13 0948 05/11/13 1440  BP: 169/63 157/56 192/75 145/43  Pulse: 55 56 103 64  Temp: 97.8 F (36.6 C) 97.5 F (36.4 C)  97.8 F (36.6 C)  TempSrc: Oral Axillary  Oral  Resp: 14 20  20   Height:      Weight:      SpO2: 99% 96%  98%    Intake/Output Summary (Last 24 hours) at 05/11/13 1638 Last data filed at 05/11/13 1500  Gross per 24 hour  Intake   1470 ml  Output   2550 ml  Net  -1080 ml   Weight change:  Exam:   General:  Pt is alert, does notfollows commands appropriately, not in acute distress; aggitated  HEENT: No icterus, [place]/AT  Cardiovascular: RRR, S1/S2  Respiratory: CTA bilaterally anterior  Abdomen: Soft/+BS, non tender, non distended, no guarding  Extremities: No edema, No lymphangiti  Data Reviewed: Basic Metabolic Panel:  Recent Labs Lab 05/08/13 1911 05/09/13 1100 05/10/13 0305 05/11/13 0512  NA 141  --  139 143  K 3.9  --  2.7* 3.2*  CL 104  --  111 115*  CO2 22  --  23 21  GLUCOSE 148*  --  57* 95  BUN 17  --  9 7  CREATININE 0.88 0.78 0.84 0.87  CALCIUM 10.1  --  7.9* 8.6  MG  --   --   --  1.6   Liver Function Tests: No results found for this basename: AST, ALT, ALKPHOS, BILITOT, PROT, ALBUMIN,  in the last 168 hours No results found for this basename: LIPASE, AMYLASE,  in the last 168 hours No results found for this basename: AMMONIA,  in the last 168 hours CBC:  Recent Labs Lab 05/08/13 1911 05/09/13 1100 05/10/13 0305  WBC 10.0 7.3 5.7  NEUTROABS 6.0  --   --   HGB 12.8* 11.5* 9.8*   HCT 37.0* 32.5* 27.7*  MCV 98.1 97.6 97.5  PLT 314 275 208   Cardiac Enzymes:  Recent Labs Lab 05/09/13 1100  TROPONINI <0.30   BNP: No components found with this basename: POCBNP,  CBG:  Recent Labs Lab 05/10/13 1235 05/10/13 1654 05/10/13 2153 05/11/13 0801 05/11/13 1207  GLUCAP 116* 102* 96 96 173*    Recent Results (from the past 240 hour(s))  MRSA PCR SCREENING     Status: None   Collection Time    05/09/13 10:33 AM      Result Value Range Status   MRSA by PCR NEGATIVE  NEGATIVE Final   Comment:            The GeneXpert MRSA Assay (FDA     approved for NASAL specimens     only), is one component of a     comprehensive MRSA colonization     surveillance program. It is not     intended  to diagnose MRSA     infection nor to guide or     monitor treatment for     MRSA infections.     Scheduled Meds: . [START ON 05/12/2013] amLODipine  10 mg Oral q morning - 10a  . Cholecalciferol  1,000 Units Oral q morning - 10a  . clonazePAM  0.5 mg Oral TID  . docusate sodium  100 mg Oral BID  . donepezil  5 mg Oral QHS  . feeding supplement (ENSURE)  1 Container Oral Q24H  . irbesartan  300 mg Oral Daily   And  . hydrochlorothiazide  12.5 mg Oral Daily  . insulin aspart  0-9 Units Subcutaneous TID WC  . LORazepam  2 mg Intramuscular Once  . multivitamin with minerals  1 tablet Oral Daily  . potassium chloride  20 mEq Oral Daily  . potassium chloride  20 mEq Oral Once  . pyridOXINE  100 mg Oral q morning - 10a  . risperiDONE  0.5 mg Oral BID  . senna  1 tablet Oral BID  . sertraline  25 mg Oral Daily  . sodium chloride  3 mL Intravenous Q12H  . vitamin C  500 mg Oral q morning - 10a  . zolpidem  5 mg Oral QHS   Continuous Infusions: . sodium chloride 0.9 % 1,000 mL with potassium chloride 20 mEq infusion 75 mL/hr at 05/11/13 0242     Bubba Vanbenschoten, DO  [facility] Pager [phone]  If 7PM-7AM, please contact night-coverage [email or web address] Password  [email or web address] 05/11/2013, 4:38 PM   LOS: 3 days

## 2013-05-12 DIAGNOSIS — Z515 Encounter for palliative care: Secondary | ICD-10-CM

## 2013-05-12 DIAGNOSIS — R627 Adult failure to thrive: Secondary | ICD-10-CM

## 2013-05-12 DIAGNOSIS — R451 Restlessness and agitation: Secondary | ICD-10-CM

## 2013-05-12 DIAGNOSIS — IMO0002 Reserved for concepts with insufficient information to code with codable children: Secondary | ICD-10-CM

## 2013-05-12 LAB — GLUCOSE, CAPILLARY: Glucose-Capillary: 109 mg/dL — ABNORMAL HIGH (ref 70–99)

## 2013-05-12 LAB — VITAMIN B12: Vitamin B-12: 721 pg/mL (ref 211–911)

## 2013-05-12 LAB — BASIC METABOLIC PANEL
BUN: 7 mg/dL (ref 6–23)
CO2: 25 mEq/L (ref 19–32)
Calcium: 9 mg/dL (ref 8.4–10.5)
Chloride: 108 mEq/L (ref 96–112)
Creatinine, Ser: 0.76 mg/dL (ref 0.50–1.35)

## 2013-05-12 LAB — RPR: RPR Ser Ql: NONREACTIVE

## 2013-05-12 MED ORDER — CLONAZEPAM 1 MG PO TABS
1.0000 mg | ORAL_TABLET | Freq: Three times a day (TID) | ORAL | Status: DC
  Administered 2013-05-12 – 2013-05-15 (×8): 1 mg via ORAL
  Filled 2013-05-12 (×10): qty 1

## 2013-05-12 MED ORDER — MORPHINE SULFATE (CONCENTRATE) 10 MG /0.5 ML PO SOLN: 5.0000 mg | ORAL | Status: DC | PRN

## 2013-05-12 MED ORDER — DONEPEZIL HCL 5 MG PO TABS
5.0000 mg | ORAL_TABLET | Freq: Every day | ORAL | Status: DC
  Administered 2013-05-12 – 2013-05-14 (×3): 5 mg via ORAL
  Filled 2013-05-12 (×4): qty 1

## 2013-05-12 MED ORDER — LORAZEPAM 2 MG/ML IJ SOLN: 2.0000 mg | INTRAMUSCULAR | Status: DC | PRN

## 2013-05-12 MED ORDER — RISPERIDONE 1 MG/ML PO SOLN
1.0000 mg | Freq: Two times a day (BID) | ORAL | Status: DC
  Administered 2013-05-12 – 2013-05-15 (×6): 1 mg via ORAL
  Filled 2013-05-12 (×7): qty 1
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
## 2013-05-12 NOTE — Progress Notes (Signed)
Clinical Social Work  CSW met with patient and Games developer at bedside. Wife plans to complete paperwork on Saturday for a Monday admission. [facility] confirms that pasarr is needed for admission. CSW called and spoke with pasarr representative [facility]) who reports she does not have to complete a face to face evaluation and believes that number should be posted on Monday.   CSW will continue to follow to assist with placement.  Unk Lightning, LCSW (Coverage for [employer])

## 2013-05-12 NOTE — Progress Notes (Signed)
Got alert from CMT saying patient was tachy in the 140s. Went in to check on patient and patient was awake, but sitting quietly in bed. Took patient's vitals: temp 97.3 axillary, BP 167/74 (down from 198/92 earlier), RR 20 and patient satting at 99% on room air. Called [facility] who ordered EKG. EKG showed sinus tach. Patient continues to sustain in the 120s, but does not appear to be uncomfortable. Asked patient if he were having pains or shortness of breath, but he was not able to give RN clear answer. Notified [facility] of EKG results and she said just to watch the patient for now. Will continue to monitor.  [name]

## 2013-05-12 NOTE — Progress Notes (Signed)
[facility] PROGRESS NOTE  [name] ZOX:[id number] DOB: [birth year] DOA: 05/08/2013 PCP: Sonda Primes, MD  Assessment/Plan: Malignant hypertension  -improved, but is now less of an issue as the patient's care has been directed toward comfort -Troponin neg, ECG grossly stable, and no headache.  -Bps may also be somewhat falsely elevated due to agitation, however, previous admissions, his pressures were not this high despite agitated behavior.  - previously Restarted home medications included, norvasc, and ARB  -increased amlodipine to 10mg  daily 05/11/13 -d/c atenolol due to bradycardia  - will not restart clonidine due to concerns for reflexive hypertension given patient's mentation and refusal to take medication.  - Will try to minimize blood draws and address agitation  Goals of Care -case discussed with Lorinda Creed -Palliative medicine was consulted -Family meeting was conducted on 05/12/2013 -Family and wife agreed to make the patient comfort care -Risperdal and clonazepam were adjusted -His medications were minimized with a focus on comfort care -Telemetry and IV were discontinued in hopes this will lessen the patient's agitation in hopes for hospice placement on 05/15/2013 -Restart Aricept at this time as discontinuation may cause precipitous decline in his dementia and increased agitation Lactic acidosis:  -May be due to dehydration as per wife, has not eaten or drank much recently  - Discontinue metformin as this may have contributed  - Aggressive hydration  - Repeat lactic acid 0.8  Agitation due to dementia  -increased Clonazepam and riperdal adjusted with a focus on comfort - QTc prolonged so will try to minimize QTc prolonging medications where possible  - d/c abilify per psychiatry recommendations  - Schedule clonazepam  - Psychiatry consultation appreciated  - consulted palliative medicine on 05/11/2013 for goals of care due to multiple hospital and [facility]  admissions  - No evidence of PNA or UTI  - restart aricept  -Continue Risperdal liquid--increase to 1 mg twice a day  -Transfer to [facility] if no improvement  -Check serum B12, RBC folate, ammonia  -TSH 1.240  Depression and anxiety with paranoid features  - Continue sertraline  - Psychiatry assistance appreciated  T2DM  -Discontinue Accu-Cheks as the patient's care is now focused on comfort - Hold glipizide and metformin  -Hemoglobin A1c 6.1 on 03/09/2013  Hypokalemia  -Will not recheck any further labs as goal is now focused on comfort care -Check magnesium--1.6  Family Communication: wife at beside  Disposition Plan: SNF with hospice          Procedures/Studies: Dg Chest 2 View  04/24/2013   CLINICAL DATA:  Abdominal pain.  Emesis.  EXAM: CHEST  2 VIEW  COMPARISON:  The 04/11/2013  FINDINGS: Chronic cardiomegaly. Convexity of the upper right mediastinum which is also stable over multiple years. No edema, asymmetric opacity, definite/significant effusion, or pneumothorax.  IMPRESSION: No active cardiopulmonary disease.   Electronically Signed   By: Tiburcio Pea M.D.   On: 04/24/2013 01:04   Dg Hip Complete Left  04/23/2013   CLINICAL DATA:  Fall with left hip pain  EXAM: LEFT HIP - COMPLETE 2+ VIEW  COMPARISON:  10/05/2007 hip x-ray  FINDINGS: There is no evidence of hip fracture or dislocation. There is no evidence of inflammatory arthropathy or other focal bone abnormality. For age, degenerative hip narrowing bilaterally is mild. Extensive arterial calcification.  IMPRESSION: No evidence of acute osseous injury.   Electronically Signed   By: Tiburcio Pea M.D.   On: 04/23/2013 23:09   Ct Head Wo Contrast  04/22/2013   CLINICAL  DATA:  Patient fell with bump to occiput and headache this past week  EXAM: CT HEAD WITHOUT CONTRAST  CT CERVICAL SPINE WITHOUT CONTRAST  TECHNIQUE: Multidetector CT imaging of the head and cervical spine was performed following the standard protocol  without intravenous contrast. Multiplanar CT image reconstructions of the cervical spine were also generated.  COMPARISON:  Head CT 12/18/2012  FINDINGS: CT HEAD FINDINGS  Diffuse atrophy and low attenuation in the deep white matter. No change in this process. No vascular territory infarct, hemorrhage, or extra-axial fluid. Calvarium is intact.  CT CERVICAL SPINE FINDINGS  Normal alignment. No prevertebral soft tissue swelling. Multilevel degenerative disc disease. No fractures.  IMPRESSION: No acute intracranial abnormality. No acute traumatic injury involving the cervical spine.   Electronically Signed   By: Esperanza Heir M.D.   On: 04/22/2013 13:15   Ct Cervical Spine Wo Contrast  04/22/2013   CLINICAL DATA:  Patient fell with bump to occiput and headache this past week  EXAM: CT HEAD WITHOUT CONTRAST  CT CERVICAL SPINE WITHOUT CONTRAST  TECHNIQUE: Multidetector CT imaging of the head and cervical spine was performed following the standard protocol without intravenous contrast. Multiplanar CT image reconstructions of the cervical spine were also generated.  COMPARISON:  Head CT 12/18/2012  FINDINGS: CT HEAD FINDINGS  Diffuse atrophy and low attenuation in the deep white matter. No change in this process. No vascular territory infarct, hemorrhage, or extra-axial fluid. Calvarium is intact.  CT CERVICAL SPINE FINDINGS  Normal alignment. No prevertebral soft tissue swelling. Multilevel degenerative disc disease. No fractures.  IMPRESSION: No acute intracranial abnormality. No acute traumatic injury involving the cervical spine.   Electronically Signed   By: Esperanza Heir M.D.   On: 04/22/2013 13:15   Ct Lumbar Spine Wo Contrast  04/22/2013   CLINICAL DATA:  Recent fall, fell earlier this week, with lumbar spine pain  EXAM: CT LUMBAR SPINE WITHOUT CONTRAST  TECHNIQUE: Multidetector CT imaging of the lumbar spine was performed without intravenous contrast administration. Multiplanar CT image  reconstructions were also generated.  COMPARISON:  MRI 06/19/2009, CT scan 04/06/2012  FINDINGS: There is mild convex left scoliosis of the lower lumbar spine. L5-S1, L4-5, and L3-4 show severe degenerative disc disease. There is moderate L2-3 and mild L1-2 degenerative disc disease. No fractures are identified. There is evidence of prior lumbar spine surgery with left L5 posterior decompression via laminectomy. There is severe L5-S1 degenerative facet change. There is moderate L4-5 and L3-4 degenerative facet change. Milder degenerative facet changes seen throughout the remainder of the lumbar spine.  IMPRESSION: Significant chronic degenerative change. No acute traumatic injury.   Electronically Signed   By: Esperanza Heir M.D.   On: 04/22/2013 14:22   Ct Abdomen Pelvis W Contrast  04/24/2013   CLINICAL DATA:  Back and leg pain. Previous lumbar surgery.  EXAM: CT ABDOMEN AND PELVIS WITH CONTRAST  TECHNIQUE: Multidetector CT imaging of the abdomen and pelvis was performed using the standard protocol following bolus administration of intravenous contrast.  CONTRAST:  50mL OMNIPAQUE IOHEXOL 300 MG/ML SOLN, OMNIPAQUE IOHEXOL 300 MG/ML SOLN  COMPARISON:  Previous day's exam and earlier studies  FINDINGS: Dependent atelectasis in the visualized lung bases. Extensive coronary calcifications. Extensive aortoiliac calcified plaque, involving visceral and renal branches. Unremarkable liver, gallbladder, spleen, adrenal glands, pancreas. Small bilateral renal cysts. No hydronephrosis. Small hiatal hernia. Stomach physiologically distended. Small bowel and colon nondilated. Innumerable descending and sigmoid diverticula. Urinary bladder physiologically distended. Moderate prostatic enlargement with central  coarse calcifications. No ascites. No free air. No adenopathy localized. Multilevel spondylitic changes in the lower thoracic and lower lumbar spine.  IMPRESSION: 1. No acute abdominal process. 2. Sigmoid  diverticulosis. 3. Hiatal hernia. 4. Atherosclerosis, including aortoiliac, renal, visceral, and coronary artery disease. Please note that although the presence of coronary artery calcium documents the presence of coronary artery disease, the severity of this disease and any potential stenosis cannot be assessed on this non-gated CT examination. Assessment for potential risk factor modification, dietary therapy or pharmacologic therapy may be warranted, if clinically indicated.   Electronically Signed   By: Oley Balm M.D.   On: 04/24/2013 00:16   Dg Chest Port 1 View  05/09/2013   CLINICAL DATA:  Increased agitation.  EXAM: PORTABLE CHEST - 1 VIEW  COMPARISON:  04/24/2013  FINDINGS: Heart size and vascularity are normal. Right peritracheal soft tissue density stable and most likely is vascular ectasia.  Lungs are clear without infiltrate or effusion.  IMPRESSION: No active disease.   Electronically Signed   By: Marlan Palau M.D.   On: 05/09/2013 12:01         Subjective: Patient was less agitated this afternoon. He is pleasantly confused. Denies any headache, chest pain, shortness breath, abdominal pain.  Objective: Filed Vitals:   05/11/13 2257 05/11/13 2356 05/12/13 0433 05/12/13 1535  BP: 198/92 167/74 140/65 163/71  Pulse: 82 125 75 66  Temp: 97.3 F (36.3 C) 97.3 F (36.3 C) 97.8 F (36.6 C) 97.2 F (36.2 C)  TempSrc: Oral  Oral Oral  Resp: 20  18 20   Height:      Weight:      SpO2: 100% 99% 99% 99%    Intake/Output Summary (Last 24 hours) at 05/12/13 1753 Last data filed at 05/12/13 1700  Gross per 24 hour  Intake   1830 ml  Output   2650 ml  Net   -820 ml   Weight change:  Exam:   General:  Pt is alert,  not in acute distress  HEENT: No icterus, No thrush, [place]/AT  Cardiovascular: RRR, S1/S2, no rubs  Respiratory: CTA bilaterally, no wheezing, no crackles, no rhonchi  Abdomen: Soft/+BS, non tender, non distended, no guarding  Extremities: No edema, No  lymphangitis, No petechiae, No rashes, no synovitis  Data Reviewed: Basic Metabolic Panel:  Recent Labs Lab 05/08/13 1911 05/09/13 1100 05/10/13 0305 05/11/13 0512 05/12/13 0501  NA 141  --  139 143 140  K 3.9  --  2.7* 3.2* 3.4*  CL 104  --  111 115* 108  CO2 22  --  23 21 25   GLUCOSE 148*  --  57* 95 117*  BUN 17  --  9 7 7   CREATININE 0.88 0.78 0.84 0.87 0.76  CALCIUM 10.1  --  7.9* 8.6 9.0  MG  --   --   --  1.6  --    Liver Function Tests: No results found for this basename: AST, ALT, ALKPHOS, BILITOT, PROT, ALBUMIN,  in the last 168 hours No results found for this basename: LIPASE, AMYLASE,  in the last 168 hours  Recent Labs Lab 05/12/13 0501  AMMONIA 29   CBC:  Recent Labs Lab 05/08/13 1911 05/09/13 1100 05/10/13 0305  WBC 10.0 7.3 5.7  NEUTROABS 6.0  --   --   HGB 12.8* 11.5* 9.8*  HCT 37.0* 32.5* 27.7*  MCV 98.1 97.6 97.5  PLT 314 275 208   Cardiac Enzymes:  Recent Labs Lab 05/09/13  1100  TROPONINI <0.30   BNP: No components found with this basename: POCBNP,  CBG:  Recent Labs Lab 05/11/13 0801 05/11/13 1207 05/11/13 1728 05/11/13 2055 05/12/13 0736  GLUCAP 96 173* 139* 120* 109*    Recent Results (from the past 240 hour(s))  MRSA PCR SCREENING     Status: None   Collection Time    05/09/13 10:33 AM      Result Value Range Status   MRSA by PCR NEGATIVE  NEGATIVE Final   Comment:            The GeneXpert MRSA Assay (FDA     approved for NASAL specimens     only), is one component of a     comprehensive MRSA colonization     surveillance program. It is not     intended to diagnose MRSA     infection nor to guide or     monitor treatment for     MRSA infections.     Scheduled Meds: . clonazePAM  1 mg Oral TID  . docusate sodium  100 mg Oral BID  . donepezil  5 mg Oral QHS  . feeding supplement (ENSURE)  1 Container Oral Q24H  . irbesartan  300 mg Oral Daily   And  . hydrochlorothiazide  12.5 mg Oral Daily  .  risperiDONE  1 mg Oral BID  . senna  1 tablet Oral BID  . sertraline  25 mg Oral Daily  . sodium chloride  3 mL Intravenous Q12H  . zolpidem  5 mg Oral QHS   Continuous Infusions:    Sydnie Sigmund, DO  [facility] Pager [phone]  If 7PM-7AM, please contact night-coverage [email or web address] Password [email or web address] 05/12/2013, 5:53 PM   LOS: 4 days

## 2013-05-12 NOTE — Care Management Note (Addendum)
    Page 1 of 2   05/15/2013     11:43:36 AM   CARE MANAGEMENT NOTE 05/15/2013  Patient:  [name], [name]   Account Number:  [id number]  Date Initiated:  05/12/2013  Documentation initiated by:  Lanier Clam  Subjective/Objective Assessment:   79 Y/O M ADMITTED W/MALIGNANT HTN.RU:EAVWUJWJ,XBJYNWGN.     Action/Plan:   FROM HOME W/SPOUSE.HAS GONE TO [place] IN PAST.   Anticipated DC Date:  05/15/2013   Anticipated DC Plan:  ASSISTED LIVING / REST HOME      DC Planning Services  CM consult      Choice offered to / List presented to:  C-3 Spouse   DME arranged  HOSPITAL BED  OVERBED TABLE  GEL OVERLAY      DME agency  [facility]     HH arranged  HH-1 RN      [facility] agency  [facility] [place]   Status of service:  Completed, signed off Medicare Important Message given?   (If response is "NO", the following Medicare IM given date fields will be blank) Date Medicare IM given:   Date Additional Medicare IM given:    Discharge Disposition:  ASSISTED LIVING  Per UR Regulation:  Reviewed for med. necessity/level of care/duration of stay  If discussed at Long Length of Stay Meetings, dates discussed:    Comments:  05/15/13 Haille Pardi RN,BSN NCM [phone] PMT-AL[facility] W/HOSPICE SERVICES.REFERRAL FOR HOSPICE CHOICE.SPOUSE CHOSE [facility] CONTRACTS W/ [facility].SPOUSE REFERS TO TALK ABOUT HOSPICE SERVICES OUTSIDE OF PATIENT'S RM,STATES HE BECOMES AGITATED WHEN HOSPICE IS MENTIONED.TC [facility] LIASON MARGIE AWARE OF CHOICE,& DME-HOSP BED,MATTRESS,OVERBED TABLE.ALREADY HAS RW,W/C,3N1.NOT RECEIVENG 02.[facility] DME [place] REP AWARE OF DME NEEDED.SW FOR TRANSPORT TO [facility].  05/12/13 Darcel Zick RN,BSN NCM [phone] PSYCH-OTPT.NOTED SW FOLLOWING FOR PLACEMENT,PASSAR.SEEN BY [facility].

## 2013-05-12 NOTE — Consult Note (Signed)
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  Lives at home with his wife, where it has become increasingly difficult and unsafe for her to try to care for him.  Has been hospitalized at [facility]  in the recent past.    Today's conversation had to help family clarify GOC and anticipatory care needs     This NP Lorinda Creed reviewed medical records, received report from team, assessed the patient and then meet at the patient's bedside along with his wife and two sons  to discuss diagnosis prognosis, GOC, EOL wishes disposition and options.   A detailed discussion was had today regarding advanced directives.  Concepts specific to code status, artifical feeding and hydration, continued IV antibiotics and rehospitalization was had.  The difference between a aggressive medical intervention path  and a palliative comfort care path for this patient at this time was had.  Values and goals of care important to patient and family were attempted to be elicited.  Concept of Hospice and Palliative Care were discussed  Natural trajectory and expectations at EOL were discussed.  Questions and concerns addressed.  Hard Choices booklet left for review. Family encouraged to call with questions or concerns.  PMT will continue to support holistically.     Goals of Care: 1.  Code Status:DNR/DNI   2. Scope of Treatment: 1. Vital Signs: daily  2. Respiratory/Oxygen:for comfort only 3. Nutritional Support/Tube Feeds: no artifical feeding now or in the  future 4. Antibiotics:determine use when infection occurs, oral only 5. Blood Products: none 6. VHQ:IONG 7. Review of Medications to be discontinued:minimize for comfort 8. Labs:none 9. Telemetry:none    4. Disposition:  Hopeful for AL Memory unit with hospice services if eligible.  With full comfort appraoch, continued physical and functional decline, poor po intake, weight loss, no re hospitalizations, Iv fluids or antibiotics, or other medical interventions to prolong life I believe this patient is hospice eligible with a prognosis of six months or less.   3. Symptom Management:   1. Anxiety/Agitation:      -increase Klonopin to 1 mg po TID      -increase Risperdal      -continue Zoloft 25 mg daily       -Ativan 2 mg every 4 hrs prn IV  2. Pain/Dyspnea: Roxanol 5 mg po every 2 hrs prn 3. Bowel Regimen:Senna one tablet BID   4. Psychosocial: Emotional support to family.  This has been a very difficult situation over the past one year related to associated behaviors with the patients dementia.  Both son live out of town and are her today to help their step mother with decisions.  All understand the overall poor prognosis and the hope is for comfort quality and dignity.     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  Lives at home with his wife, where it has become increasingly difficult and unsafe for her to try to care for him.  Has been hospitalized at [facility]  in the recent past. Other co-morbidities HTN, DM, urinary retention, OA, hyperlipidemia.   ROS: unable to illicit due to altered cognition    PMH:  Past Medical History  Diagnosis Date  . Carotid artery stenosis   .  Gait disturbance   . Anxiety   . GERD (gastroesophageal reflux disease)   . Vitamin B12 deficiency   . Depression   . Osteoarthritis   . Diabetes mellitus, type 2   . Hypertension   . Vitamin D deficiency   . Tremor   . Aortic stenosis     murmur  . Hyperlipidemia   . Diverticulosis   . Adenomatous polyp of colon   . Memory loss   . Polyneuropathy in diabetes(357.2)   . Dementia   . Dysrhythmia   . BPH (benign prostatic hyperplasia)      PSH: Past Surgical History  Procedure Laterality Date  . Vasectomy    . Lumbar laminectomy      L5 Dr Ophelia Charter  . Transurethral resection of prostate  March 2007  . Cataract extraction, bilateral Bilateral   . Tonsillectomy and adenoidectomy    . Left foot      fracture of left foot   I have reviewed the FH and SH and  If appropriate update it with new information. Allergies  Allergen Reactions  . Citalopram Hydrobromide Other (See Comments)    no reaction, refused to take  . Duloxetine Other (See Comments)    did not like, refused to continue   Scheduled Meds: . amLODipine  10 mg Oral q morning - 10a  . Cholecalciferol  1,000 Units Oral q morning - 10a  . clonazePAM  0.5 mg Oral TID  . docusate sodium  100 mg Oral BID  . donepezil  5 mg Oral QHS  . feeding supplement (ENSURE)  1 Container Oral Q24H  . irbesartan  300 mg Oral Daily   And  . hydrochlorothiazide  12.5 mg Oral Daily  . insulin aspart  0-9 Units Subcutaneous TID WC  . LORazepam  2 mg Intramuscular Once  . multivitamin with minerals  1 tablet Oral Daily  . potassium chloride  20 mEq Oral Daily  . potassium chloride  20 mEq Oral Once  . pyridOXINE  100 mg Oral q morning - 10a  . risperiDONE  0.5 mg Oral BID  . senna  1 tablet Oral BID  . sertraline  25 mg Oral Daily  . sodium chloride  3 mL Intravenous Q12H  . vitamin C  500 mg Oral q morning - 10a  . zolpidem  5 mg Oral QHS   Continuous Infusions: . sodium chloride 0.9 % 1,000 mL with potassium chloride 20  mEq infusion 75 mL/hr at 05/12/13 0716   PRN Meds:.acetaminophen, acetaminophen, diclofenac sodium, hydrALAZINE, LORazepam, ondansetron, polyethylene glycol, sodium chloride, traMADol    BP 140/65  Pulse 75  Temp(Src) 97.8 F (36.6 C) (Oral)  Resp 18  Ht 5\' 3"  (1.6 m)  Wt 59.6 kg (131 lb 6.3 oz)  BMI 23.28 kg/m2  SpO2 99%   PPS:30 %   Intake/Output Summary (Last 24 hours) at 05/12/13 0851 Last data filed at 05/12/13 0800  Gross per 24 hour  Intake  2300 ml  Output   3125 ml  Net   -825 ml    Physical Exam:  General: chronically ill appearing frail elderly male HEENT:  Mm, no exudate, + temporal muscle wasting Chest:   Diminished in bases CVS: RRR Abdomen:soft NT +BS Ext: without edmea Neuro:oreinted only to person, unable to name his sons  Labs: CBC    Component Value Date/Time   WBC 5.7 05/10/2013 0305   RBC 2.84* 05/10/2013 0305   HGB 9.8* 05/10/2013 0305   HCT 27.7* 05/10/2013 0305   PLT 208 05/10/2013 0305   MCV 97.5 05/10/2013 0305   MCH 34.5* 05/10/2013 0305   MCHC 35.4 05/10/2013 0305   RDW 15.3 05/10/2013 0305   LYMPHSABS 3.0 05/08/2013 1911   MONOABS 1.1* 05/08/2013 1911   EOSABS 0.0 05/08/2013 1911   BASOSABS 0.0 05/08/2013 1911    BMET    Component Value Date/Time   NA 140 05/12/2013 0501   K 3.4* 05/12/2013 0501   CL 108 05/12/2013 0501   CO2 25 05/12/2013 0501   GLUCOSE 117* 05/12/2013 0501   GLUCOSE 120* 07/01/2006 0952   BUN 7 05/12/2013 0501   CREATININE 0.76 05/12/2013 0501   CALCIUM 9.0 05/12/2013 0501   GFRNONAA 80* 05/12/2013 0501   GFRAA >90 05/12/2013 0501    CMP     Component Value Date/Time   NA 140 05/12/2013 0501   K 3.4* 05/12/2013 0501   CL 108 05/12/2013 0501   CO2 25 05/12/2013 0501   GLUCOSE 117* 05/12/2013 0501   GLUCOSE 120* 07/01/2006 0952   BUN 7 05/12/2013 0501   CREATININE 0.76 05/12/2013 0501   CALCIUM 9.0 05/12/2013 0501   PROT 6.7 04/24/2013 0555   ALBUMIN 3.2* 04/24/2013 0555   AST 12  04/24/2013 0555   ALT 10 04/24/2013 0555   ALKPHOS 55 04/24/2013 0555   BILITOT 1.0 04/24/2013 0555   GFRNONAA 80* 05/12/2013 0501   GFRAA >90 05/12/2013 0501       Time In Time Out Total Time Spent with Patient Total Overall Time  0845 1015 80 min 90 min    Greater than 50%  of this time was spent counseling and coordinating care related to the above assessment and plan.  Lorinda Creed NP  Palliative Medicine Team Team Phone # [phone] Pager [phone]  Discussed with Dr Tat

## 2013-05-13 DIAGNOSIS — I4891 Unspecified atrial fibrillation: Secondary | ICD-10-CM

## 2013-05-13 DIAGNOSIS — R627 Adult failure to thrive: Secondary | ICD-10-CM

## 2013-05-13 NOTE — Progress Notes (Signed)
[facility] PROGRESS NOTE  [name] ZOX:[id number] DOB: 1924/09/01 DOA: 05/08/2013 PCP: Sonda Primes, MD  Assessment/Plan: Malignant hypertension  -improved, but is now less of an issue as the patient's care has been directed toward comfort  -Troponin neg, ECG grossly stable, and no headache.  -Bps may also be somewhat falsely elevated due to agitation - previously Restarted home medications included, norvasc, and ARB  -increased amlodipine to 10mg  daily 05/11/13  -d/c atenolol due to bradycardia  - will not restart clonidine due to concerns for reflexive hypertension given patient's mentation and refusal to take medication.  - Will try to minimize blood draws and address agitation  Goals of Care  -case discussed with Lorinda Creed  -Family meeting was conducted with palliative medicine on 05/12/2013  -Family and wife agreed to make the patient comfort care  -Risperdal and clonazepam were adjusted for pt's aggitation -His medications were minimized with a focus on comfort care  -Telemetry and IV were discontinued in hopes this will lessen the patient's agitation in hopes for hospice placement on 05/15/2013  -Restart Aricept at this time as discontinuation may cause precipitous decline in his dementia and increased agitation  Lactic acidosis:  -May be due to dehydration as per wife, has not eaten or drank much recently  - Discontinue metformin as this may have contributed  - Aggressive hydration  - Repeat lactic acid 0.8  Agitation due to dementia  -increased Clonazepam and riperdal adjusted with a focus on comfort  - QTc prolonged--no longer active issue as care is comfort focused - d/c abilify per psychiatry recommendations  - Schedule clonazepam  - Psychiatry consultation appreciated  - appreciate Palliative med - No evidence of PNA or UTI  - restart aricept  -Continue Risperdal liquid--increase to 1 mg twice a day  -serum B12--721, RBC folate--677, ammonia--29   -TSH 1.240  Depression and anxiety with paranoid features  - Continue sertraline  - Psychiatry assistance appreciated  T2DM  -Discontinue Accu-Cheks as the patient's care is now focused on comfort  - Hold glipizide and metformin  -Hemoglobin A1c 6.1 on 03/09/2013  Hypokalemia  -Will not recheck any further labs as goal is now focused on comfort care  -Check magnesium--1.6  Family Communication: wife at beside  Disposition Plan: SNF with hospice on 05/15/13          Procedures/Studies: Dg Chest 2 View  04/24/2013   CLINICAL DATA:  Abdominal pain.  Emesis.  EXAM: CHEST  2 VIEW  COMPARISON:  The 04/11/2013  FINDINGS: Chronic cardiomegaly. Convexity of the upper right mediastinum which is also stable over multiple years. No edema, asymmetric opacity, definite/significant effusion, or pneumothorax.  IMPRESSION: No active cardiopulmonary disease.   Electronically Signed   By: Tiburcio Pea M.D.   On: 04/24/2013 01:04   Dg Hip Complete Left  04/23/2013   CLINICAL DATA:  Fall with left hip pain  EXAM: LEFT HIP - COMPLETE 2+ VIEW  COMPARISON:  10/05/2007 hip x-ray  FINDINGS: There is no evidence of hip fracture or dislocation. There is no evidence of inflammatory arthropathy or other focal bone abnormality. For age, degenerative hip narrowing bilaterally is mild. Extensive arterial calcification.  IMPRESSION: No evidence of acute osseous injury.   Electronically Signed   By: Tiburcio Pea M.D.   On: 04/23/2013 23:09   Ct Head Wo Contrast  04/22/2013   CLINICAL DATA:  Patient fell with bump to occiput and headache this past week  EXAM: CT HEAD WITHOUT CONTRAST  CT CERVICAL  SPINE WITHOUT CONTRAST  TECHNIQUE: Multidetector CT imaging of the head and cervical spine was performed following the standard protocol without intravenous contrast. Multiplanar CT image reconstructions of the cervical spine were also generated.  COMPARISON:  Head CT 12/18/2012  FINDINGS: CT HEAD FINDINGS  Diffuse  atrophy and low attenuation in the deep white matter. No change in this process. No vascular territory infarct, hemorrhage, or extra-axial fluid. Calvarium is intact.  CT CERVICAL SPINE FINDINGS  Normal alignment. No prevertebral soft tissue swelling. Multilevel degenerative disc disease. No fractures.  IMPRESSION: No acute intracranial abnormality. No acute traumatic injury involving the cervical spine.   Electronically Signed   By: Esperanza Heir M.D.   On: 04/22/2013 13:15   Ct Cervical Spine Wo Contrast  04/22/2013   CLINICAL DATA:  Patient fell with bump to occiput and headache this past week  EXAM: CT HEAD WITHOUT CONTRAST  CT CERVICAL SPINE WITHOUT CONTRAST  TECHNIQUE: Multidetector CT imaging of the head and cervical spine was performed following the standard protocol without intravenous contrast. Multiplanar CT image reconstructions of the cervical spine were also generated.  COMPARISON:  Head CT 12/18/2012  FINDINGS: CT HEAD FINDINGS  Diffuse atrophy and low attenuation in the deep white matter. No change in this process. No vascular territory infarct, hemorrhage, or extra-axial fluid. Calvarium is intact.  CT CERVICAL SPINE FINDINGS  Normal alignment. No prevertebral soft tissue swelling. Multilevel degenerative disc disease. No fractures.  IMPRESSION: No acute intracranial abnormality. No acute traumatic injury involving the cervical spine.   Electronically Signed   By: Esperanza Heir M.D.   On: 04/22/2013 13:15   Ct Lumbar Spine Wo Contrast  04/22/2013   CLINICAL DATA:  Recent fall, fell earlier this week, with lumbar spine pain  EXAM: CT LUMBAR SPINE WITHOUT CONTRAST  TECHNIQUE: Multidetector CT imaging of the lumbar spine was performed without intravenous contrast administration. Multiplanar CT image reconstructions were also generated.  COMPARISON:  MRI 06/19/2009, CT scan 04/06/2012  FINDINGS: There is mild convex left scoliosis of the lower lumbar spine. L5-S1, L4-5, and L3-4 show severe  degenerative disc disease. There is moderate L2-3 and mild L1-2 degenerative disc disease. No fractures are identified. There is evidence of prior lumbar spine surgery with left L5 posterior decompression via laminectomy. There is severe L5-S1 degenerative facet change. There is moderate L4-5 and L3-4 degenerative facet change. Milder degenerative facet changes seen throughout the remainder of the lumbar spine.  IMPRESSION: Significant chronic degenerative change. No acute traumatic injury.   Electronically Signed   By: Esperanza Heir M.D.   On: 04/22/2013 14:22   Ct Abdomen Pelvis W Contrast  04/24/2013   CLINICAL DATA:  Back and leg pain. Previous lumbar surgery.  EXAM: CT ABDOMEN AND PELVIS WITH CONTRAST  TECHNIQUE: Multidetector CT imaging of the abdomen and pelvis was performed using the standard protocol following bolus administration of intravenous contrast.  CONTRAST:  50mL OMNIPAQUE IOHEXOL 300 MG/ML SOLN, OMNIPAQUE IOHEXOL 300 MG/ML SOLN  COMPARISON:  Previous day's exam and earlier studies  FINDINGS: Dependent atelectasis in the visualized lung bases. Extensive coronary calcifications. Extensive aortoiliac calcified plaque, involving visceral and renal branches. Unremarkable liver, gallbladder, spleen, adrenal glands, pancreas. Small bilateral renal cysts. No hydronephrosis. Small hiatal hernia. Stomach physiologically distended. Small bowel and colon nondilated. Innumerable descending and sigmoid diverticula. Urinary bladder physiologically distended. Moderate prostatic enlargement with central coarse calcifications. No ascites. No free air. No adenopathy localized. Multilevel spondylitic changes in the lower thoracic and lower lumbar spine.  IMPRESSION: 1. No acute abdominal process. 2. Sigmoid diverticulosis. 3. Hiatal hernia. 4. Atherosclerosis, including aortoiliac, renal, visceral, and coronary artery disease. Please note that although the presence of coronary artery calcium documents the  presence of coronary artery disease, the severity of this disease and any potential stenosis cannot be assessed on this non-gated CT examination. Assessment for potential risk factor modification, dietary therapy or pharmacologic therapy may be warranted, if clinically indicated.   Electronically Signed   By: Oley Balm M.D.   On: 04/24/2013 00:16   Dg Chest Port 1 View  05/09/2013   CLINICAL DATA:  Increased agitation.  EXAM: PORTABLE CHEST - 1 VIEW  COMPARISON:  04/24/2013  FINDINGS: Heart size and vascularity are normal. Right peritracheal soft tissue density stable and most likely is vascular ectasia.  Lungs are clear without infiltrate or effusion.  IMPRESSION: No active disease.   Electronically Signed   By: Marlan Palau M.D.   On: 05/09/2013 12:01         Subjective: Patient still having intermittent episodes of agitation but less frequently. No episodes of vomiting, diarrhea, respiratory distress.  Objective: Filed Vitals:   05/12/13 1535 05/12/13 2130 05/13/13 0437 05/13/13 0945  BP: 163/71 183/76 110/65   Pulse: 66  118   Temp: 97.2 F (36.2 C) 98.3 F (36.8 C) 97.2 F (36.2 C)   TempSrc: Oral Oral Oral   Resp: 20 18 18 18   Height:      Weight:      SpO2: 99% 99% 99%     Intake/Output Summary (Last 24 hours) at 05/13/13 1504 Last data filed at 05/13/13 0501  Gross per 24 hour  Intake    120 ml  Output   2025 ml  Net  -1905 ml   Weight change:  Exam:   General:  Pt is alert, does not follow commands appropriately, not in acute distress  HEENT: No icterus,[place]/AT  Cardiovascular: RRR, S1/S2, no rubs  Respiratory: Diminished breath sounds but clear to auscultation. No wheezing.  Abdomen: Soft/+BS, non tender, non distended, no guarding  Extremities: No edema, No lymphangitis  Data Reviewed: Basic Metabolic Panel:  Recent Labs Lab 05/08/13 1911 05/09/13 1100 05/10/13 0305 05/11/13 0512 05/12/13 0501  NA 141  --  139 143 140  K 3.9  --  2.7*  3.2* 3.4*  CL 104  --  111 115* 108  CO2 22  --  23 21 25   GLUCOSE 148*  --  57* 95 117*  BUN 17  --  9 7 7   CREATININE 0.88 0.78 0.84 0.87 0.76  CALCIUM 10.1  --  7.9* 8.6 9.0  MG  --   --   --  1.6  --    Liver Function Tests: No results found for this basename: AST, ALT, ALKPHOS, BILITOT, PROT, ALBUMIN,  in the last 168 hours No results found for this basename: LIPASE, AMYLASE,  in the last 168 hours  Recent Labs Lab 05/12/13 0501  AMMONIA 29   CBC:  Recent Labs Lab 05/08/13 1911 05/09/13 1100 05/10/13 0305  WBC 10.0 7.3 5.7  NEUTROABS 6.0  --   --   HGB 12.8* 11.5* 9.8*  HCT 37.0* 32.5* 27.7*  MCV 98.1 97.6 97.5  PLT 314 275 208   Cardiac Enzymes:  Recent Labs Lab 05/09/13 1100  TROPONINI <0.30   BNP: No components found with this basename: POCBNP,  CBG:  Recent Labs Lab 05/11/13 0801 05/11/13 1207 05/11/13 1728 05/11/13 2055 05/12/13 0736  GLUCAP 96 173* 139* 120* 109*    Recent Results (from the past 240 hour(s))  MRSA PCR SCREENING     Status: None   Collection Time    05/09/13 10:33 AM      Result Value Range Status   MRSA by PCR NEGATIVE  NEGATIVE Final   Comment:            The GeneXpert MRSA Assay (FDA     approved for NASAL specimens     only), is one component of a     comprehensive MRSA colonization     surveillance program. It is not     intended to diagnose MRSA     infection nor to guide or     monitor treatment for     MRSA infections.     Scheduled Meds: . clonazePAM  1 mg Oral TID  . docusate sodium  100 mg Oral BID  . donepezil  5 mg Oral QHS  . feeding supplement (ENSURE)  1 Container Oral Q24H  . irbesartan  300 mg Oral Daily   And  . hydrochlorothiazide  12.5 mg Oral Daily  . risperiDONE  1 mg Oral BID  . senna  1 tablet Oral BID  . sertraline  25 mg Oral Daily  . sodium chloride  3 mL Intravenous Q12H  . zolpidem  5 mg Oral QHS   Continuous Infusions:    Rhea Kaelin, DO  [facility] Pager  [phone]  If 7PM-7AM, please contact night-coverage [email or web address] Password [email or web address] 05/13/2013, 3:04 PM   LOS: 5 days
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
## 2013-05-14 NOTE — Progress Notes (Addendum)
Patient's urine output noted to be minimal. Dr. Arbutus Leas aware.  Patient did have very large bowel movement today. Erskin Burnet RN

## 2013-05-14 NOTE — Progress Notes (Signed)
[facility] PROGRESS NOTE  [name] ZOX:[id number] DOB: [birth year] DOA: 05/08/2013 PCP: Sonda Primes, MD  Assessment/Plan: Malignant hypertension  -improved, but is now less of an issue as the patient's care has been directed toward comfort  -Troponin neg, ECG grossly stable, and no headache.  -Bps may also be somewhat falsely elevated due to agitation  - previously Restarted home medications included, norvasc, and ARB  -increased amlodipine to 10mg  daily 05/11/13  -d/c atenolol due to bradycardia  - will not restart clonidine due to concerns for reflexive hypertension given patient's mentation and refusal to take medication.  - Will try to minimize blood draws and address agitation  Goals of Care  -case discussed with Lorinda Creed  -Family meeting was conducted with palliative medicine on 05/12/2013  -Family and wife agreed to make the patient comfort care  -Risperdal and clonazepam were adjusted for pt's aggitation  -His medications were minimized with a focus on comfort care  -Telemetry and IV were discontinued in hopes this will lessen the patient's agitation in hopes for hospice placement on 05/15/2013  -Restart Aricept at this time as discontinuation may cause precipitous decline in his dementia and increased agitation  -offered emotional support and prayer Lactic acidosis:  -May be due to dehydration as per wife, has not eaten or drank much recently  - Discontinue metformin as this may have contributed  - Aggressive hydration  - Repeat lactic acid 0.8  Agitation due to dementia  -increased Clonazepam and riperdal adjusted with a focus on comfort  - QTc prolonged--no longer active issue as care is comfort focused  - d/c abilify per psychiatry recommendations  - Schedule clonazepam  - Psychiatry consultation appreciated  - appreciate Palliative med  - No evidence of PNA or UTI  - restart aricept  -Continue Risperdal liquid--increase to 1 mg twice a day   -serum B12--721, RBC folate--677, ammonia--29  -TSH 1.240  Depression and anxiety with paranoid features  - Continue sertraline  - Psychiatry assistance appreciated  T2DM  -Discontinue Accu-Cheks as the patient's care is now focused on comfort  - Hold glipizide and metformin  -Hemoglobin A1c 6.1 on 03/09/2013  Hypokalemia  -Will not recheck any further labs as goal is now focused on comfort care  -Check magnesium--1.6  Family Communication: wife at beside  Disposition Plan: SNF with hospice on 05/15/13            Procedures/Studies: Dg Chest 2 View  04/24/2013   CLINICAL DATA:  Abdominal pain.  Emesis.  EXAM: CHEST  2 VIEW  COMPARISON:  The 04/11/2013  FINDINGS: Chronic cardiomegaly. Convexity of the upper right mediastinum which is also stable over multiple years. No edema, asymmetric opacity, definite/significant effusion, or pneumothorax.  IMPRESSION: No active cardiopulmonary disease.   Electronically Signed   By: Tiburcio Pea M.D.   On: 04/24/2013 01:04   Dg Hip Complete Left  04/23/2013   CLINICAL DATA:  Fall with left hip pain  EXAM: LEFT HIP - COMPLETE 2+ VIEW  COMPARISON:  10/05/2007 hip x-ray  FINDINGS: There is no evidence of hip fracture or dislocation. There is no evidence of inflammatory arthropathy or other focal bone abnormality. For age, degenerative hip narrowing bilaterally is mild. Extensive arterial calcification.  IMPRESSION: No evidence of acute osseous injury.   Electronically Signed   By: Tiburcio Pea M.D.   On: 04/23/2013 23:09   Ct Head Wo Contrast  04/22/2013   CLINICAL DATA:  Patient fell with bump to occiput and headache this  past week  EXAM: CT HEAD WITHOUT CONTRAST  CT CERVICAL SPINE WITHOUT CONTRAST  TECHNIQUE: Multidetector CT imaging of the head and cervical spine was performed following the standard protocol without intravenous contrast. Multiplanar CT image reconstructions of the cervical spine were also generated.  COMPARISON:  Head CT  12/18/2012  FINDINGS: CT HEAD FINDINGS  Diffuse atrophy and low attenuation in the deep white matter. No change in this process. No vascular territory infarct, hemorrhage, or extra-axial fluid. Calvarium is intact.  CT CERVICAL SPINE FINDINGS  Normal alignment. No prevertebral soft tissue swelling. Multilevel degenerative disc disease. No fractures.  IMPRESSION: No acute intracranial abnormality. No acute traumatic injury involving the cervical spine.   Electronically Signed   By: Esperanza Heir M.D.   On: 04/22/2013 13:15   Ct Cervical Spine Wo Contrast  04/22/2013   CLINICAL DATA:  Patient fell with bump to occiput and headache this past week  EXAM: CT HEAD WITHOUT CONTRAST  CT CERVICAL SPINE WITHOUT CONTRAST  TECHNIQUE: Multidetector CT imaging of the head and cervical spine was performed following the standard protocol without intravenous contrast. Multiplanar CT image reconstructions of the cervical spine were also generated.  COMPARISON:  Head CT 12/18/2012  FINDINGS: CT HEAD FINDINGS  Diffuse atrophy and low attenuation in the deep white matter. No change in this process. No vascular territory infarct, hemorrhage, or extra-axial fluid. Calvarium is intact.  CT CERVICAL SPINE FINDINGS  Normal alignment. No prevertebral soft tissue swelling. Multilevel degenerative disc disease. No fractures.  IMPRESSION: No acute intracranial abnormality. No acute traumatic injury involving the cervical spine.   Electronically Signed   By: Esperanza Heir M.D.   On: 04/22/2013 13:15   Ct Lumbar Spine Wo Contrast  04/22/2013   CLINICAL DATA:  Recent fall, fell earlier this week, with lumbar spine pain  EXAM: CT LUMBAR SPINE WITHOUT CONTRAST  TECHNIQUE: Multidetector CT imaging of the lumbar spine was performed without intravenous contrast administration. Multiplanar CT image reconstructions were also generated.  COMPARISON:  MRI 06/19/2009, CT scan 04/06/2012  FINDINGS: There is mild convex left scoliosis of the lower  lumbar spine. L5-S1, L4-5, and L3-4 show severe degenerative disc disease. There is moderate L2-3 and mild L1-2 degenerative disc disease. No fractures are identified. There is evidence of prior lumbar spine surgery with left L5 posterior decompression via laminectomy. There is severe L5-S1 degenerative facet change. There is moderate L4-5 and L3-4 degenerative facet change. Milder degenerative facet changes seen throughout the remainder of the lumbar spine.  IMPRESSION: Significant chronic degenerative change. No acute traumatic injury.   Electronically Signed   By: Esperanza Heir M.D.   On: 04/22/2013 14:22   Ct Abdomen Pelvis W Contrast  04/24/2013   CLINICAL DATA:  Back and leg pain. Previous lumbar surgery.  EXAM: CT ABDOMEN AND PELVIS WITH CONTRAST  TECHNIQUE: Multidetector CT imaging of the abdomen and pelvis was performed using the standard protocol following bolus administration of intravenous contrast.  CONTRAST:  50mL OMNIPAQUE IOHEXOL 300 MG/ML SOLN, OMNIPAQUE IOHEXOL 300 MG/ML SOLN  COMPARISON:  Previous day's exam and earlier studies  FINDINGS: Dependent atelectasis in the visualized lung bases. Extensive coronary calcifications. Extensive aortoiliac calcified plaque, involving visceral and renal branches. Unremarkable liver, gallbladder, spleen, adrenal glands, pancreas. Small bilateral renal cysts. No hydronephrosis. Small hiatal hernia. Stomach physiologically distended. Small bowel and colon nondilated. Innumerable descending and sigmoid diverticula. Urinary bladder physiologically distended. Moderate prostatic enlargement with central coarse calcifications. No ascites. No free air. No adenopathy localized. Multilevel  spondylitic changes in the lower thoracic and lower lumbar spine.  IMPRESSION: 1. No acute abdominal process. 2. Sigmoid diverticulosis. 3. Hiatal hernia. 4. Atherosclerosis, including aortoiliac, renal, visceral, and coronary artery disease. Please note that although the  presence of coronary artery calcium documents the presence of coronary artery disease, the severity of this disease and any potential stenosis cannot be assessed on this non-gated CT examination. Assessment for potential risk factor modification, dietary therapy or pharmacologic therapy may be warranted, if clinically indicated.   Electronically Signed   By: Oley Balm M.D.   On: 04/24/2013 00:16   Dg Chest Port 1 View  05/09/2013   CLINICAL DATA:  Increased agitation.  EXAM: PORTABLE CHEST - 1 VIEW  COMPARISON:  04/24/2013  FINDINGS: Heart size and vascularity are normal. Right peritracheal soft tissue density stable and most likely is vascular ectasia.  Lungs are clear without infiltrate or effusion.  IMPRESSION: No active disease.   Electronically Signed   By: Marlan Palau M.D.   On: 05/09/2013 12:01         Subjective: Patient is resting comfortably. No periods of agitation today. Not much oral intake. No respiratory distress, vomiting, diarrhea.  Objective: Filed Vitals:   05/13/13 2029 05/14/13 0511 05/14/13 1102 05/14/13 1419  BP: 115/57 112/48 138/58 101/54  Pulse: 75 58 62 69  Temp: 98.1 F (36.7 C) 97.1 F (36.2 C)  97.4 F (36.3 C)  TempSrc: Axillary Axillary  Oral  Resp: 18 19  18   Height:      Weight:      SpO2: 97% 97%  96%    Intake/Output Summary (Last 24 hours) at 05/14/13 1830 Last data filed at 05/14/13 1419  Gross per 24 hour  Intake     60 ml  Output    225 ml  Net   -165 ml   Weight change:  Exam:   General:  Pt is resting comfortably, not in acute distress  HEENT: No icterus, , [place]/AT  Cardiovascular: RRR, S1/S2, no rubs  Respiratory: Diminished breath sounds. No wheezing.  Abdomen: Soft/+BS, non tender  Extremities: No edema,  Data Reviewed: Basic Metabolic Panel:  Recent Labs Lab 05/08/13 1911 05/09/13 1100 05/10/13 0305 05/11/13 0512 05/12/13 0501  NA 141  --  139 143 140  K 3.9  --  2.7* 3.2* 3.4*  CL 104  --  111 115*  108  CO2 22  --  23 21 25   GLUCOSE 148*  --  57* 95 117*  BUN 17  --  9 7 7   CREATININE 0.88 0.78 0.84 0.87 0.76  CALCIUM 10.1  --  7.9* 8.6 9.0  MG  --   --   --  1.6  --    Liver Function Tests: No results found for this basename: AST, ALT, ALKPHOS, BILITOT, PROT, ALBUMIN,  in the last 168 hours No results found for this basename: LIPASE, AMYLASE,  in the last 168 hours  Recent Labs Lab 05/12/13 0501  AMMONIA 29   CBC:  Recent Labs Lab 05/08/13 1911 05/09/13 1100 05/10/13 0305  WBC 10.0 7.3 5.7  NEUTROABS 6.0  --   --   HGB 12.8* 11.5* 9.8*  HCT 37.0* 32.5* 27.7*  MCV 98.1 97.6 97.5  PLT 314 275 208   Cardiac Enzymes:  Recent Labs Lab 05/09/13 1100  TROPONINI <0.30   BNP: No components found with this basename: POCBNP,  CBG:  Recent Labs Lab 05/11/13 0801 05/11/13 1207 05/11/13 1728 05/11/13 2055  05/12/13 0736  GLUCAP 96 173* 139* 120* 109*    Recent Results (from the past 240 hour(s))  MRSA PCR SCREENING     Status: None   Collection Time    05/09/13 10:33 AM      Result Value Range Status   MRSA by PCR NEGATIVE  NEGATIVE Final   Comment:            The GeneXpert MRSA Assay (FDA     approved for NASAL specimens     only), is one component of a     comprehensive MRSA colonization     surveillance program. It is not     intended to diagnose MRSA     infection nor to guide or     monitor treatment for     MRSA infections.     Scheduled Meds: . clonazePAM  1 mg Oral TID  . docusate sodium  100 mg Oral BID  . donepezil  5 mg Oral QHS  . feeding supplement (ENSURE)  1 Container Oral Q24H  . irbesartan  300 mg Oral Daily   And  . hydrochlorothiazide  12.5 mg Oral Daily  . risperiDONE  1 mg Oral BID  . senna  1 tablet Oral BID  . sertraline  25 mg Oral Daily  . sodium chloride  3 mL Intravenous Q12H  . zolpidem  5 mg Oral QHS   Continuous Infusions:    Brendi Mccarroll, DO  [facility] Pager [phone]  If 7PM-7AM, please contact  night-coverage [email or web address] Password [email or web address] 05/14/2013, 6:30 PM   LOS: 6 days

## 2013-05-15 ENCOUNTER — Telehealth: Payer: Self-pay | Admitting: *Deleted

## 2013-05-15 MED ORDER — RISPERIDONE 1 MG/ML PO SOLN: 1.0000 mg | Freq: Two times a day (BID) | ORAL | Status: DC

## 2013-05-15 MED ORDER — MORPHINE SULFATE (CONCENTRATE) 10 MG /0.5 ML PO SOLN: 5.0000 mg | ORAL | Status: DC | PRN

## 2013-05-15 MED ORDER — CLONAZEPAM 1 MG PO TABS: 1.0000 mg | ORAL_TABLET | Freq: Three times a day (TID) | ORAL | Status: DC

## 2013-05-15 NOTE — Progress Notes (Signed)
Notified by Lanier Clam CMRN, patient and family request services of [facility]) after discharge.   Patient information reviewed with Dr Jacalyn Lefevre [facility] Staff Physician and hospice eligibility confirmed.  Spoke with patient's wife away from bedside to initiate education related to hospice services, philosophy and team approach to care she voiced good understanding of information provided. Wife was tearful during visit, shared she is feeling overwhelmed by the patient's quick decline "we were just planning to go back to [place]" emotional support offered  Per notes plan is to d/c to [facility] today by non-emergent transport CSW is following up with ALF staff to confirm DME delivery; DNR and MOST forms to be sent with patient's paperwork to facility  DME Complete Package D: fully electric hospital bed with full rails and AP&P mattress and over-bed table to be delivered to ALF [facility] - CMRN has notified [facility] representative and equipment order was placed  Initial paperwork faxed to [facility]  Completed d/c summary will need to be faxed to [facility] @ [phone] when final Please notify [facility] when patient is ready to leave unit at d/c call [phone] (or [phone] if after 5 pm);  [facility] information and contact numbers also given to  during visit.   Above information shared with Jacklynn Lewis LCSWA Please call with any questions or concerns   Valente David, RN 05/15/2013, 1:06 PM [facility] Palliative Medicine Team RN Liaison [phone]

## 2013-05-15 NOTE — Progress Notes (Signed)
Progress Note from the Palliative Medicine Team at [facility]  Subjective: patient is sitting up in bed, assisted with his breakfast, wife at bedside  -continued conversation regarding natural trajectory of dementia and expectations at EOL   Objective: Allergies  Allergen Reactions  . Citalopram Hydrobromide Other (See Comments)    no reaction, refused to take  . Duloxetine Other (See Comments)    did not like, refused to continue   Scheduled Meds: . clonazePAM  1 mg Oral TID  . docusate sodium  100 mg Oral BID  . donepezil  5 mg Oral QHS  . feeding supplement (ENSURE)  1 Container Oral Q24H  . risperiDONE  1 mg Oral BID  . senna  1 tablet Oral BID  . sertraline  25 mg Oral Daily  . sodium chloride  3 mL Intravenous Q12H  . zolpidem  5 mg Oral QHS   Continuous Infusions:  PRN Meds:.acetaminophen, acetaminophen, diclofenac sodium, LORazepam, morphine CONCENTRATE, ondansetron  BP 145/59  Pulse 56  Temp(Src) 97.5 F (36.4 C) (Oral)  Resp 20  Ht 5\' 3"  (1.6 m)  Wt 59.6 kg (131 lb 6.3 oz)  BMI 23.28 kg/m2  SpO2 99%   PPS:30 %     Intake/Output Summary (Last 24 hours) at 05/15/13 0852 Last data filed at 05/14/13 1419  Gross per 24 hour  Intake     60 ml  Output      0 ml  Net     60 ml       Physical Exam:  General: elderly frail white male HEENT:  Mm, no exudate Chest:   CTA CVS: RRR Abdomen:Soft NT +BS Ext:  Without edema Neuro: moves all extremities,  Psych: calm and cooperative, oriented to person and place  Labs: CBC    Component Value Date/Time   WBC 5.7 05/10/2013 0305   RBC 2.84* 05/10/2013 0305   HGB 9.8* 05/10/2013 0305   HCT 27.7* 05/10/2013 0305   PLT 208 05/10/2013 0305   MCV 97.5 05/10/2013 0305   MCH 34.5* 05/10/2013 0305   MCHC 35.4 05/10/2013 0305   RDW 15.3 05/10/2013 0305   LYMPHSABS 3.0 05/08/2013 1911   MONOABS 1.1* 05/08/2013 1911   EOSABS 0.0 05/08/2013 1911   BASOSABS 0.0 05/08/2013 1911    BMET    Component Value  Date/Time   NA 140 05/12/2013 0501   K 3.4* 05/12/2013 0501   CL 108 05/12/2013 0501   CO2 25 05/12/2013 0501   GLUCOSE 117* 05/12/2013 0501   GLUCOSE 120* 07/01/2006 0952   BUN 7 05/12/2013 0501   CREATININE 0.76 05/12/2013 0501   CALCIUM 9.0 05/12/2013 0501   GFRNONAA 80* 05/12/2013 0501   GFRAA >90 05/12/2013 0501    CMP     Component Value Date/Time   NA 140 05/12/2013 0501   K 3.4* 05/12/2013 0501   CL 108 05/12/2013 0501   CO2 25 05/12/2013 0501   GLUCOSE 117* 05/12/2013 0501   GLUCOSE 120* 07/01/2006 0952   BUN 7 05/12/2013 0501   CREATININE 0.76 05/12/2013 0501   CALCIUM 9.0 05/12/2013 0501   PROT 6.7 04/24/2013 0555   ALBUMIN 3.2* 04/24/2013 0555   AST 12 04/24/2013 0555   ALT 10 04/24/2013 0555   ALKPHOS 55 04/24/2013 0555   BILITOT 1.0 04/24/2013 0555   GFRNONAA 80* 05/12/2013 0501   GFRAA >90 05/12/2013 0501       Assessment and Plan: 1. Code Status: DNR/DNI-comfort is main focus of care 2. Symptom Control:  Agitation: Klonopin 1 mg TID/ Risperdal 1 mg BID/Zoloft 25 mg daily  3. Psycho/Social:  Emotional support offered to wife.  Continued support and education regarding the path of comfort in the facility with the assistance of hospice.  Questions and concerns addressed 4. Spiritual  Wife is comforted by her faith and pray 5. Disposition:  To AL with hospice today  Patient Documents Completed or Given: Document Given Completed  Advanced Directives Pkt    MOST yes   DNR    Gone from My Sight    Hard Choices yes     Time In Time Out Total Time Spent with Patient Total Overall Time  0815 0845 30 min 30 min    Greater than 50%  of this time was spent counseling and coordinating care related to the above assessment and plan.  Lorinda Creed NP  Palliative Medicine Team Team Phone # [phone] Pager [phone]  Discussed with Dr Tat 1

## 2013-05-15 NOTE — Progress Notes (Signed)
Pt for discharge to [facility] today with [facility] [place].  CSW faxed pt discharge information to ALF and awaiting ALF to review discharge information and notify this CSW that pt can transfer to ALF.  CSW to facilitate pt discharge needs when ALF reviews and confirmed discharge information.  Jacklynn Lewis, MSW, LCSWA (coverage for Becky Sax, [place]) Clinical Social Work [phone]

## 2013-05-15 NOTE — Progress Notes (Signed)
[facility]  Patient Status: [facility]  [facility] is providing the following services: Hospice Pkg D  If patient discharges after hours, please call [phone].   [name] 05/15/2013, 11:08 AM

## 2013-05-15 NOTE — Progress Notes (Signed)
Patient discharged to [facility] via ambulance, wife at the bedside during transportation. Scabs on on arms from skin tears. No pressure ulcer noted. Patient alert and oriented to self, follows simple command, no distress notes. F/U with plan of care at the facility.

## 2013-05-15 NOTE — Telephone Encounter (Signed)
[name] from Hospice called states pt was d'ced with Hospice to [facility], requesting whether Dr Posey Rea will be attending MD with symptom management from Hospice attending.  Please advise

## 2013-05-15 NOTE — Telephone Encounter (Signed)
Per verbal from [facility] Dr Posey Rea will assume care.

## 2013-05-15 NOTE — Progress Notes (Signed)
Pt discharging to [facility] memory care unit with [facility] [place] to follow at ALF.  CSW received return phone call from [facility] confirming that facility had reviewed and confirmed discharge information and facility can accept pt.  CSW discussed that equipment had been ordered for pt including a hospital bed and [facility] memory care unit stated that they could accept pt to facility before equipment arrived as they have a bed that pt can be placed in until hospital bed arrives.   CSW facilitated pt discharge needs including contacting facility, discussing with pt wife in hallway, providing RN phone number to call report, and arranging ambulance transport for pt to [facility]. (Service Request ID#:  [id number]).  No further social work needs identified at this time.   CSW signing off.   Jacklynn Lewis, MSW, LCSWA  Clinical Social Work [phone]

## 2013-06-01 ENCOUNTER — Telehealth: Payer: Self-pay | Admitting: Internal Medicine

## 2013-06-01 NOTE — Telephone Encounter (Signed)
Ok to work in Thx 

## 2013-06-01 NOTE — Telephone Encounter (Signed)
Mr. [name] was in the hospital and is now in [id number] on [place].  He is having swelling in the legs.  His meds were changed while in the hospital and need to be gone over.  Mrs. Kady wants him to be seen here.  The [facility] bus only transports on [place] and Tues and stops at 4:00.  Could he be worked in between 11 and 1:30 Tues?

## 2013-06-02 ENCOUNTER — Telehealth: Payer: Self-pay | Admitting: Family Medicine

## 2013-06-02 NOTE — Telephone Encounter (Signed)
Appt scheduled for 1:30 on Tues Dec. 9.

## 2013-06-02 NOTE — Telephone Encounter (Signed)
Call from hospice nurse Mandy: Pt has been running fevers since last night - Tmax 101 now 99, and less responsive than usual  Has a MOST order- declines any hospitalizations/ ER trips or IV meds but if open to antibiotics if needed  Wanted order to get a portable cxr  I ok'd that and she will call back with a result

## 2013-06-06 ENCOUNTER — Telehealth: Payer: Self-pay | Admitting: Internal Medicine

## 2013-06-06 ENCOUNTER — Ambulatory Visit (INDEPENDENT_AMBULATORY_CARE_PROVIDER_SITE_OTHER): Payer: MEDICARE | Admitting: Internal Medicine

## 2013-06-06 ENCOUNTER — Encounter: Payer: Self-pay | Admitting: Internal Medicine

## 2013-06-06 ENCOUNTER — Other Ambulatory Visit: Payer: MEDICARE

## 2013-06-06 VITALS — BP 200/110 | HR 88 | Temp 97.4°F | Resp 16 | Wt 142.0 lb

## 2013-06-06 DIAGNOSIS — R609 Edema, unspecified: Secondary | ICD-10-CM | POA: Insufficient documentation

## 2013-06-06 DIAGNOSIS — I10 Essential (primary) hypertension: Secondary | ICD-10-CM

## 2013-06-06 DIAGNOSIS — E538 Deficiency of other specified B group vitamins: Secondary | ICD-10-CM

## 2013-06-06 DIAGNOSIS — E119 Type 2 diabetes mellitus without complications: Secondary | ICD-10-CM

## 2013-06-06 MED ORDER — POTASSIUM CHLORIDE ER 10 MEQ PO TBCR: 10.0000 meq | EXTENDED_RELEASE_TABLET | Freq: Every day | ORAL | Status: DC

## 2013-06-06 MED ORDER — LOSARTAN POTASSIUM 50 MG PO TABS: 50.0000 mg | ORAL_TABLET | Freq: Every day | ORAL | Status: DC

## 2013-06-06 MED ORDER — FUROSEMIDE 20 MG PO TABS: 20.0000 mg | ORAL_TABLET | Freq: Every day | ORAL | Status: DC

## 2013-06-06 MED ORDER — CYANOCOBALAMIN 1000 MCG/ML IJ SOLN
1000.0000 ug | Freq: Once | INTRAMUSCULAR | Status: AC
  Administered 2013-06-06: 1000 ug via INTRAMUSCULAR

## 2013-06-06 NOTE — Telephone Encounter (Signed)
Mrs. Capek would like to know if she should look into a skilled nursing facility now to go ahead and place [name].  FYI:  Mr. Dom would not let them take his blood in the lab today.

## 2013-06-06 NOTE — Progress Notes (Signed)
Subjective:    Patient ID: [name], male    DOB: Oct 18, 1924, 79 y.o.   MRN: [id number]  HPI  C/o edema and wt gain C/o PNA on CXR - started Levaquin  The patient presents for a follow-up of  chronic hypertension, chronic dyslipidemia, type 2 diabetes controlled with medicines, paranoid disorder, dementia  Kathlene November is living at [facility]. Hospice is involved.  BP Readings from Last 3 Encounters:  06/06/13 200/110  05/15/13 145/59  04/24/13 129/86   Wt Readings from Last 3 Encounters:  06/06/13 142 lb (64.411 kg)  05/10/13 131 lb 6.3 oz (59.6 kg)  04/24/13 125 lb 11.2 oz (57.017 kg)          Review of Systems  Constitutional: Positive for unexpected weight change. Negative for appetite change and fatigue.  HENT: Negative for congestion, nosebleeds, sneezing, sore throat and trouble swallowing.   Eyes: Negative for itching and visual disturbance.  Respiratory: Negative for cough.   Cardiovascular: Positive for leg swelling. Negative for chest pain and palpitations.  Gastrointestinal: Negative for nausea, diarrhea, blood in stool and abdominal distention.  Genitourinary: Negative for frequency and hematuria.  Musculoskeletal: Negative for back pain, gait problem, joint swelling and neck pain.  Skin: Negative for rash.  Neurological: Positive for weakness. Negative for dizziness, tremors, speech difficulty and light-headedness.  Psychiatric/Behavioral: Positive for behavioral problems, confusion, sleep disturbance, self-injury, dysphoric mood and decreased concentration. Negative for suicidal ideas and agitation. The patient is nervous/anxious.        Objective:   Physical Exam  Constitutional: He is oriented to person, place, and time. He appears well-developed. No distress.  NAD In a w/c  HENT:  Mouth/Throat: Oropharynx is clear and moist.  Eyes: Conjunctivae are normal. Pupils are equal, round, and reactive to light.  Neck: Normal range of motion. No JVD  present. No thyromegaly present.  Cardiovascular: Normal rate, regular rhythm, normal heart sounds and intact distal pulses.  Exam reveals no gallop and no friction rub.   No murmur heard. Pulmonary/Chest: Effort normal and breath sounds normal. No respiratory distress. He has no wheezes. He has no rales. He exhibits no tenderness.  Abdominal: Soft. Bowel sounds are normal. He exhibits no distension and no mass. There is no tenderness. There is no rebound and no guarding.  Musculoskeletal: Normal range of motion. He exhibits edema (1+ B). He exhibits no tenderness.  Lymphadenopathy:    He has no cervical adenopathy.  Neurological: He is alert and oriented to person, place, and time. He has normal reflexes. No cranial nerve deficit. He exhibits normal muscle tone. He displays a negative Romberg sign. Coordination abnormal. Gait normal.  No meningeal signs  Skin: Skin is warm and dry. No rash noted. No erythema.  Psychiatric: Judgment normal.    Lab Results  Component Value Date   WBC 5.7 05/10/2013   HGB 9.8* 05/10/2013   HCT 27.7* 05/10/2013   PLT 208 05/10/2013   GLUCOSE 117* 05/12/2013   CHOL 174 03/09/2013   TRIG 69.0 03/09/2013   HDL 41.00 03/09/2013   LDLDIRECT 141.6 07/11/2007   LDLCALC 119* 03/09/2013   ALT 10 04/24/2013   AST 12 04/24/2013   NA 140 05/12/2013   K 3.4* 05/12/2013   CL 108 05/12/2013   CREATININE 0.76 05/12/2013   BUN 7 05/12/2013   CO2 25 05/12/2013   TSH 1.240 04/24/2013   PSA 1.29 03/09/2013   INR 1.00 09/24/2012   HGBA1C 6.1 03/09/2013  Assessment & Plan:

## 2013-06-06 NOTE — Telephone Encounter (Signed)
Yes, she should. Thx

## 2013-06-06 NOTE — Assessment & Plan Note (Signed)
Continue with current prescription therapy as reflected on the Med list.  

## 2013-06-06 NOTE — Assessment & Plan Note (Signed)
Off Rx now Labs

## 2013-06-06 NOTE — Progress Notes (Signed)
Pre visit review using our clinic review tool, if applicable. No additional management support is needed unless otherwise documented below in the visit note. 

## 2013-06-06 NOTE — Assessment & Plan Note (Signed)
12/14 Furosemide Klor-con

## 2013-06-06 NOTE — Patient Instructions (Signed)
Increase Furosemide to 40 mg a day if swelling is not better in 3 days

## 2013-06-06 NOTE — Assessment & Plan Note (Signed)
Worse Added Furosemide and Losartan

## 2013-06-07 ENCOUNTER — Telehealth: Payer: Self-pay | Admitting: *Deleted

## 2013-06-07 NOTE — Telephone Encounter (Signed)
Please advise 

## 2013-06-07 NOTE — Telephone Encounter (Signed)
I called Shelly Bombard. I have sent a letter concerning this patient, and the attorneys office will convert this into an affidavit. If this accurately reflects what I have indicated the letter, I will be happy to sign this document. They'll fax this over tomorrow.

## 2013-06-07 NOTE — Telephone Encounter (Signed)
I called Ms. [name] from the attorney office. I called her at [phone]. I left a message, I will call back later today. I talked with the wife, and got this number to call. The wife is going for guardianship, the patient is in [facility]. The patient currently has pneumonia.

## 2013-06-07 NOTE — Telephone Encounter (Signed)
[name] is aware.

## 2013-06-12 ENCOUNTER — Telehealth: Payer: Self-pay | Admitting: *Deleted

## 2013-06-12 NOTE — Telephone Encounter (Signed)
Rf req for Ambien 10 mg 1 po qhs. Ok to Rf?

## 2013-06-12 NOTE — Telephone Encounter (Signed)
OK to fill this prescription with additional refills x3 Thank you!  

## 2013-06-13 MED ORDER — ZOLPIDEM TARTRATE 10 MG PO TABS: 10.0000 mg | ORAL_TABLET | Freq: Every evening | ORAL | Status: DC | PRN

## 2013-06-13 NOTE — Telephone Encounter (Signed)
Rx printed/faxed to pharmacy at [phone].

## 2013-06-14 ENCOUNTER — Other Ambulatory Visit: Payer: Self-pay | Admitting: Internal Medicine

## 2013-06-14 MED ORDER — FUROSEMIDE 20 MG PO TABS: 40.0000 mg | ORAL_TABLET | Freq: Every day | ORAL | Status: DC

## 2013-06-14 MED ORDER — LEVOFLOXACIN 500 MG PO TABS: 500.0000 mg | ORAL_TABLET | Freq: Every day | ORAL | Status: DC

## 2013-06-19 ENCOUNTER — Other Ambulatory Visit: Payer: Self-pay | Admitting: Internal Medicine

## 2013-06-19 MED ORDER — LEVOFLOXACIN 500 MG PO TABS: 500.0000 mg | ORAL_TABLET | Freq: Every day | ORAL | Status: DC

## 2013-06-19 MED ORDER — FUROSEMIDE 20 MG PO TABS: 40.0000 mg | ORAL_TABLET | Freq: Every day | ORAL | Status: DC

## 2013-06-20 ENCOUNTER — Encounter (HOSPITAL_COMMUNITY): Payer: Self-pay | Admitting: Emergency Medicine

## 2013-06-20 ENCOUNTER — Emergency Department (HOSPITAL_COMMUNITY)
Admission: EM | Admit: 2013-06-20 | Discharge: 2013-06-20 | Disposition: A | Discharge status: Skilled Nursing Facility | Attending: Emergency Medicine | Admitting: Emergency Medicine

## 2013-06-20 ENCOUNTER — Ambulatory Visit: Payer: Self-pay | Admitting: Internal Medicine

## 2013-06-20 ENCOUNTER — Emergency Department (HOSPITAL_COMMUNITY)

## 2013-06-20 DIAGNOSIS — W06XXXA Fall from bed, initial encounter: Secondary | ICD-10-CM | POA: Insufficient documentation

## 2013-06-20 DIAGNOSIS — F3289 Other specified depressive episodes: Secondary | ICD-10-CM | POA: Insufficient documentation

## 2013-06-20 DIAGNOSIS — J159 Unspecified bacterial pneumonia: Secondary | ICD-10-CM | POA: Insufficient documentation

## 2013-06-20 DIAGNOSIS — Z860100 Personal history of colon polyps, unspecified: Secondary | ICD-10-CM | POA: Insufficient documentation

## 2013-06-20 DIAGNOSIS — R011 Cardiac murmur, unspecified: Secondary | ICD-10-CM | POA: Insufficient documentation

## 2013-06-20 DIAGNOSIS — Y939 Activity, unspecified: Secondary | ICD-10-CM | POA: Insufficient documentation

## 2013-06-20 DIAGNOSIS — Z8639 Personal history of other endocrine, nutritional and metabolic disease: Secondary | ICD-10-CM | POA: Insufficient documentation

## 2013-06-20 DIAGNOSIS — Z87448 Personal history of other diseases of urinary system: Secondary | ICD-10-CM | POA: Insufficient documentation

## 2013-06-20 DIAGNOSIS — M1990 Unspecified osteoarthritis, unspecified site: Secondary | ICD-10-CM | POA: Insufficient documentation

## 2013-06-20 DIAGNOSIS — S0003XA Contusion of scalp, initial encounter: Secondary | ICD-10-CM | POA: Insufficient documentation

## 2013-06-20 DIAGNOSIS — Z79899 Other long term (current) drug therapy: Secondary | ICD-10-CM | POA: Insufficient documentation

## 2013-06-20 DIAGNOSIS — E1142 Type 2 diabetes mellitus with diabetic polyneuropathy: Secondary | ICD-10-CM | POA: Insufficient documentation

## 2013-06-20 DIAGNOSIS — F329 Major depressive disorder, single episode, unspecified: Secondary | ICD-10-CM | POA: Insufficient documentation

## 2013-06-20 DIAGNOSIS — E1149 Type 2 diabetes mellitus with other diabetic neurological complication: Secondary | ICD-10-CM | POA: Insufficient documentation

## 2013-06-20 DIAGNOSIS — Z87891 Personal history of nicotine dependence: Secondary | ICD-10-CM | POA: Insufficient documentation

## 2013-06-20 DIAGNOSIS — F0280 Dementia in other diseases classified elsewhere without behavioral disturbance: Secondary | ICD-10-CM | POA: Insufficient documentation

## 2013-06-20 DIAGNOSIS — J189 Pneumonia, unspecified organism: Secondary | ICD-10-CM

## 2013-06-20 DIAGNOSIS — I10 Essential (primary) hypertension: Secondary | ICD-10-CM | POA: Insufficient documentation

## 2013-06-20 DIAGNOSIS — Y9210 Unspecified residential institution as the place of occurrence of the external cause: Secondary | ICD-10-CM | POA: Insufficient documentation

## 2013-06-20 DIAGNOSIS — F411 Generalized anxiety disorder: Secondary | ICD-10-CM | POA: Insufficient documentation

## 2013-06-20 DIAGNOSIS — S0083XA Contusion of other part of head, initial encounter: Secondary | ICD-10-CM

## 2013-06-20 DIAGNOSIS — Z792 Long term (current) use of antibiotics: Secondary | ICD-10-CM | POA: Insufficient documentation

## 2013-06-20 DIAGNOSIS — G309 Alzheimer's disease, unspecified: Secondary | ICD-10-CM | POA: Insufficient documentation

## 2013-06-20 LAB — BASIC METABOLIC PANEL
BUN: 18 mg/dL (ref 6–23)
Calcium: 8.7 mg/dL (ref 8.4–10.5)
Chloride: 105 mEq/L (ref 96–112)
Creatinine, Ser: 1.08 mg/dL (ref 0.50–1.35)
GFR calc non Af Amer: 60 mL/min — ABNORMAL LOW (ref >90)
Glucose, Bld: 124 mg/dL — ABNORMAL HIGH (ref 70–99)
Sodium: 142 mEq/L (ref 135–145)

## 2013-06-20 LAB — CBC WITH DIFFERENTIAL/PLATELET
Basophils Absolute: 0 10*3/uL (ref 0.0–0.1)
Eosinophils Absolute: 0 10*3/uL (ref 0.0–0.7)
Eosinophils Relative: 0 % (ref 0–5)
HCT: 38 % — ABNORMAL LOW (ref 39.0–52.0)
Lymphs Abs: 2.3 10*3/uL (ref 0.7–4.0)
MCH: 34.2 pg — ABNORMAL HIGH (ref 26.0–34.0)
MCV: 100 fL (ref 78.0–100.0)
Monocytes Absolute: 0.8 10*3/uL (ref 0.1–1.0)
Neutro Abs: 1.3 10*3/uL — ABNORMAL LOW (ref 1.7–7.7)
Platelets: 233 10*3/uL (ref 150–400)
RDW: 15.5 % (ref 11.5–15.5)

## 2013-06-20 MED ORDER — LEVOFLOXACIN 500 MG PO TABS
500.0000 mg | ORAL_TABLET | Freq: Once | ORAL | Status: AC
  Administered 2013-06-20: 500 mg via ORAL
  Filled 2013-06-20: qty 1

## 2013-06-20 NOTE — ED Notes (Signed)
Pt is from an Alzheimer's unit, pt was found on the floor lying on his back by a staff member, staff is unsure of how long pt was on the floor, however they do two hour checks. EMS reports the pt is a hospice pt and is a DNR, EMS reports the pt is in Afib, pt's wife reported that the pt was once in Afib before however he cardioverted on his own and it never returned. Pt is oriented to self. Pt was hypertensive upon initial assessment by EMS 190/90, pt's BP en route to department was 140/70. Pt has a hx of diabetes. CBG 116 as reported by EMS.

## 2013-06-20 NOTE — ED Provider Notes (Signed)
CSN: [id number]     Arrival date & time 06/20/13  0121 History   First MD Initiated Contact with Patient 06/20/13 0242     Chief Complaint  Patient presents with  . Fall    HPI   gentleman from the Alzheimer's care facility. He fell out of his bed and has a contusion to his forehead. His wife states that she received a call that he had fallen and she arrived in that same year. She states he seems his normal self to her. She does confirm that he has had a cough for several weeks. She states an x-ray a few days ago and had a confirm pneumonia. His posterior started Levaquin today but did not. Her knowledge she has not been febrile. He has had a cough. He does have a history of paroxysmal age of fibrillation. His wife states that he is intermittently in atrial fibrillation but he is not anticoagulated she is able to confirm.  Past Medical History  Diagnosis Date  . Carotid artery stenosis   . Gait disturbance   . Anxiety   . GERD (gastroesophageal reflux disease)   . Vitamin B12 deficiency   . Depression   . Osteoarthritis   . Diabetes mellitus, type 2   . Hypertension   . Vitamin D deficiency   . Tremor   . Aortic stenosis     murmur  . Hyperlipidemia   . Diverticulosis   . Adenomatous polyp of colon   . Memory loss   . Polyneuropathy in diabetes(357.2)   . Dementia   . Dysrhythmia   . BPH (benign prostatic hyperplasia)    Past Surgical History  Procedure Laterality Date  . Vasectomy    . Lumbar laminectomy      L5 Dr Ophelia Charter  . Transurethral resection of prostate  March 2007  . Cataract extraction, bilateral Bilateral   . Tonsillectomy and adenoidectomy    . Left foot      fracture of left foot   Family History  Problem Relation Age of Onset  . Diabetes Father   . Colon cancer Brother   . Heart disease Brother   . Diabetes Brother   . Parkinson's disease Sister   . Arthritis Sister   . Alzheimer's disease Sister    History  Substance Use Topics  . Smoking  status: Former Smoker    Types: Cigars  . Smokeless tobacco: Never Used  . Alcohol Use: No    Review of Systems  Unable to perform ROS   Allergies  Citalopram hydrobromide and Duloxetine  Home Medications   Current Outpatient Rx  Name  Route  Sig  Dispense  Refill  . acetaminophen (TYLENOL) 325 MG tablet   Oral   Take 650 mg by mouth every 6 (six) hours as needed.         . clonazePAM (KLONOPIN) 1 MG tablet   Oral   Take 1 tablet (1 mg total) by mouth 3 (three) times daily.   90 tablet   0   . diclofenac sodium (VOLTAREN) 1 % GEL   Topical   Apply 2 g topically 2 (two) times daily as needed (pain left ankle and left hip). For pain         . donepezil (ARICEPT) 5 MG tablet   Oral   Take 1 tablet (5 mg total) by mouth at bedtime.   90 tablet   2   . furosemide (LASIX) 20 MG tablet   Oral   Take  2 tablets (40 mg total) by mouth daily.   60 tablet   5   . levofloxacin (LEVAQUIN) 500 MG tablet   Oral   Take 1 tablet (500 mg total) by mouth daily.   10 tablet   0   . losartan (COZAAR) 50 MG tablet   Oral   Take 1 tablet (50 mg total) by mouth daily.   30 tablet   5   . Morphine Sulfate (MORPHINE CONCENTRATE) 10 mg / 0.5 ml concentrated solution   Oral   Take 0.25 mLs (5 mg total) by mouth every 2 (two) hours as needed for moderate pain or shortness of breath.   30 mL   0   . ondansetron (ZOFRAN ODT) 4 MG disintegrating tablet   Oral   Take 1 tablet (4 mg total) by mouth every 8 (eight) hours as needed for nausea.   20 tablet   0   . potassium chloride (KLOR-CON 10) 10 MEQ tablet   Oral   Take 1 tablet (10 mEq total) by mouth daily.   30 tablet   5   . risperiDONE (RISPERDAL) 1 MG/ML oral solution   Oral   Take 1 mL (1 mg total) by mouth 2 (two) times daily.   30 mL   0   . sertraline (ZOLOFT) 25 MG tablet   Oral   Take 25 mg by mouth daily.         Marland Kitchen zolpidem (AMBIEN) 10 MG tablet   Oral   Take 1-1.5 tablets (10-15 mg total) by  mouth at bedtime as needed for sleep.   30 tablet   3    BP 158/89  Pulse 119  Temp(Src) 97.5 F (36.4 C) (Oral)  Resp 24  SpO2 96% Physical Exam  Constitutional:  Elderly adult male in a cervical collar. Contusion of left forehead.  HENT:  No blood over the TMs mastoids or from ears nose or mouth  Eyes:  No periorbital ecchymosis  Neck:  Complains of pain primarily in the lateral lower neck.  Cardiovascular: Normal rate.  An irregularly irregular rhythm present.  Irregularly irregular rhythm. Controlled rate. No crackles or rales.  Pulmonary/Chest:  Normal pulmonary exam without crackles or rales. I am unable to auscultate infiltrate noted on CT  Abdominal:  Benign abdomen  Musculoskeletal:  Moving all his extremities without difficulty  Neurological:  No focal weakness  Skin:  Forehead contusion    ED Course  Procedures (including critical care time) Labs Review Labs Reviewed  CBC WITH DIFFERENTIAL - Abnormal; Notable for the following:    RBC 3.80 (*)    HCT 38.0 (*)    MCH 34.2 (*)    Neutrophils Relative % 30 (*)    Neutro Abs 1.3 (*)    Lymphocytes Relative 52 (*)    Monocytes Relative 17 (*)    All other components within normal limits  BASIC METABOLIC PANEL - Abnormal; Notable for the following:    Glucose, Bld 124 (*)    GFR calc non Af Amer 60 (*)    GFR calc Af Amer 70 (*)    All other components within normal limits   Imaging Review Dg Chest 1 View  06/20/2013   CLINICAL DATA:  Status post fall; chest pain.  EXAM: CHEST - 1 VIEW  COMPARISON:  Chest radiograph performed 05/09/2013  FINDINGS: The lungs are well-aerated. Mild vascular congestion may be transient in nature. There is no evidence of focal opacification, pleural effusion or pneumothorax.  The cardiomediastinal silhouette is borderline normal in size. No acute osseous abnormalities are seen.  IMPRESSION: Mild vascular congestion may be transient in nature; lungs remain grossly clear. No  displaced rib fracture seen.   Electronically Signed   By: Roanna Raider M.D.   On: 06/20/2013 03:55   Ct Head Wo Contrast  06/20/2013   CLINICAL DATA:  Found on floor. Hypertension. Concern for head injury.  EXAM: CT HEAD WITHOUT CONTRAST  CT CERVICAL SPINE WITHOUT CONTRAST  TECHNIQUE: Multidetector CT imaging of the head and cervical spine was performed following the standard protocol without intravenous contrast. Multiplanar CT image reconstructions of the cervical spine were also generated.  COMPARISON:  CT of the head performed 04/22/2013  FINDINGS: CT HEAD FINDINGS:  There is no evidence of acute infarction, mass lesion, or intra- or extra-axial hemorrhage on CT.  Prominence of the ventricles and sulci reflects mild to moderate cortical volume loss. Scattered periventricular and subcortical white matter change likely reflects small vessel ischemic microangiopathy, with chronic ischemic change at the basal ganglia bilaterally. A small chronic lacunar infarct is noted within the right cerebellar hemisphere.  The brainstem and fourth ventricle are within normal limits. The cerebral hemispheres demonstrate grossly normal gray-white differentiation. No mass effect or midline shift is seen.  There is no evidence of fracture; visualized osseous structures are unremarkable in appearance. The visualized portions of the orbits are within normal limits. There is partial opacification of the maxillary sinuses, more prominent on the left. The remaining paranasal sinuses and mastoid air cells are well-aerated. No significant soft tissue abnormalities are seen.  CT CERVICAL SPINE FINDINGS:  There is no evidence of fracture or subluxation. Vertebral bodies demonstrate normal height and alignment. Multilevel disc space narrowing is noted along the cervical spine, with scattered anterior and posterior disc osteophyte complexes. Scattered facet disease is noted along the cervical spine. Prevertebral soft tissues are within  normal limits.  The thyroid gland is unremarkable in appearance. New focal airspace opacity is noted at the right lung apex; this may reflect a mild infectious process. Relatively dense calcification is noted at the carotid bifurcations bilaterally.  IMPRESSION: 1. No evidence of traumatic intracranial injury or fracture. 2. No evidence of fracture or subluxation along the cervical spine. 3. New focal airspace opacity at the right lung apex; this may reflect a mild infectious process. 4. Mild to moderate cortical volume loss and scattered small vessel ischemic microangiopathy. 5. Chronic ischemic change at the basal ganglia bilaterally; small chronic lacunar infarct within the right cerebellar hemisphere. 6. Partial opacification of the maxillary sinuses, more prominent on the left. 7. Degenerative change noted along the cervical spine. 8. Relatively dense calcification at the carotid bifurcations bilaterally. Carotid ultrasound would be helpful for further evaluation, when and as deemed clinically appropriate.   Electronically Signed   By: Roanna Raider M.D.   On: 06/20/2013 03:19   Ct Cervical Spine Wo Contrast  06/20/2013   CLINICAL DATA:  Found on floor. Hypertension. Concern for head injury.  EXAM: CT HEAD WITHOUT CONTRAST  CT CERVICAL SPINE WITHOUT CONTRAST  TECHNIQUE: Multidetector CT imaging of the head and cervical spine was performed following the standard protocol without intravenous contrast. Multiplanar CT image reconstructions of the cervical spine were also generated.  COMPARISON:  CT of the head performed 04/22/2013  FINDINGS: CT HEAD FINDINGS:  There is no evidence of acute infarction, mass lesion, or intra- or extra-axial hemorrhage on CT.  Prominence of the ventricles and sulci reflects mild to  moderate cortical volume loss. Scattered periventricular and subcortical white matter change likely reflects small vessel ischemic microangiopathy, with chronic ischemic change at the basal ganglia  bilaterally. A small chronic lacunar infarct is noted within the right cerebellar hemisphere.  The brainstem and fourth ventricle are within normal limits. The cerebral hemispheres demonstrate grossly normal gray-white differentiation. No mass effect or midline shift is seen.  There is no evidence of fracture; visualized osseous structures are unremarkable in appearance. The visualized portions of the orbits are within normal limits. There is partial opacification of the maxillary sinuses, more prominent on the left. The remaining paranasal sinuses and mastoid air cells are well-aerated. No significant soft tissue abnormalities are seen.  CT CERVICAL SPINE FINDINGS:  There is no evidence of fracture or subluxation. Vertebral bodies demonstrate normal height and alignment. Multilevel disc space narrowing is noted along the cervical spine, with scattered anterior and posterior disc osteophyte complexes. Scattered facet disease is noted along the cervical spine. Prevertebral soft tissues are within normal limits.  The thyroid gland is unremarkable in appearance. New focal airspace opacity is noted at the right lung apex; this may reflect a mild infectious process. Relatively dense calcification is noted at the carotid bifurcations bilaterally.  IMPRESSION: 1. No evidence of traumatic intracranial injury or fracture. 2. No evidence of fracture or subluxation along the cervical spine. 3. New focal airspace opacity at the right lung apex; this may reflect a mild infectious process. 4. Mild to moderate cortical volume loss and scattered small vessel ischemic microangiopathy. 5. Chronic ischemic change at the basal ganglia bilaterally; small chronic lacunar infarct within the right cerebellar hemisphere. 6. Partial opacification of the maxillary sinuses, more prominent on the left. 7. Degenerative change noted along the cervical spine. 8. Relatively dense calcification at the carotid bifurcations bilaterally. Carotid  ultrasound would be helpful for further evaluation, when and as deemed clinically appropriate.   Electronically Signed   By: Roanna Raider M.D.   On: 06/20/2013 03:19    EKG Interpretation   None       MDM   1. Forehead contusion, initial encounter   2. Community acquired pneumonia    CAT scan of his head and neck show no acute processes. Plain chest x-ray does not demonstrate obvious pneumonia. CT of his neck as evidenced demonstrate subtle infiltrate in the right lung apex consistent with pneumonia. He was given his first dose of Levaquin here. His wife states this was confirmed on x-ray 2 days ago and he is supposed to started Levaquin today but did not. He is not tachypneic, he is not tachycardic, he is not hypoxemic. He is not appear ill. I think he is perfectly appropriate for outpatient treatment of his pneumonia as planned.    Rolland Porter, MD 06/21/13 [phone]

## 2013-06-28 ENCOUNTER — Encounter: Payer: Self-pay | Admitting: Internal Medicine

## 2013-07-04 ENCOUNTER — Other Ambulatory Visit (INDEPENDENT_AMBULATORY_CARE_PROVIDER_SITE_OTHER): Payer: Medicare Other

## 2013-07-04 ENCOUNTER — Ambulatory Visit (INDEPENDENT_AMBULATORY_CARE_PROVIDER_SITE_OTHER): Payer: Medicare Other | Admitting: Internal Medicine

## 2013-07-04 ENCOUNTER — Encounter: Payer: Self-pay | Admitting: Internal Medicine

## 2013-07-04 ENCOUNTER — Other Ambulatory Visit: Payer: Self-pay | Admitting: *Deleted

## 2013-07-04 VITALS — BP 130/76 | HR 68 | Temp 96.1°F | Resp 16 | Wt 124.0 lb

## 2013-07-04 DIAGNOSIS — F411 Generalized anxiety disorder: Secondary | ICD-10-CM

## 2013-07-04 DIAGNOSIS — M545 Low back pain, unspecified: Secondary | ICD-10-CM

## 2013-07-04 DIAGNOSIS — J189 Pneumonia, unspecified organism: Secondary | ICD-10-CM

## 2013-07-04 DIAGNOSIS — E538 Deficiency of other specified B group vitamins: Secondary | ICD-10-CM

## 2013-07-04 DIAGNOSIS — F0390 Unspecified dementia without behavioral disturbance: Secondary | ICD-10-CM

## 2013-07-04 DIAGNOSIS — E119 Type 2 diabetes mellitus without complications: Secondary | ICD-10-CM

## 2013-07-04 LAB — CBC WITH DIFFERENTIAL/PLATELET
BASOS PCT: 0.3 % (ref 0.0–3.0)
Basophils Absolute: 0 10*3/uL (ref 0.0–0.1)
EOS ABS: 0 10*3/uL (ref 0.0–0.7)
EOS PCT: 0.6 % (ref 0.0–5.0)
HCT: 34.2 % — ABNORMAL LOW (ref 39.0–52.0)
Hemoglobin: 11.6 g/dL — ABNORMAL LOW (ref 13.0–17.0)
LYMPHS PCT: 27.5 % (ref 12.0–46.0)
Lymphs Abs: 1.7 10*3/uL (ref 0.7–4.0)
MCHC: 33.9 g/dL (ref 30.0–36.0)
MCV: 98.8 fl (ref 78.0–100.0)
Monocytes Absolute: 0.8 10*3/uL (ref 0.1–1.0)
Monocytes Relative: 13.3 % — ABNORMAL HIGH (ref 3.0–12.0)
Neutro Abs: 3.6 10*3/uL (ref 1.4–7.7)
Neutrophils Relative %: 58.3 % (ref 43.0–77.0)
PLATELETS: 168 10*3/uL (ref 150.0–400.0)
RBC: 3.46 Mil/uL — AB (ref 4.22–5.81)
RDW: 15.3 % — ABNORMAL HIGH (ref 11.5–14.6)
WBC: 6.1 10*3/uL (ref 4.5–10.5)

## 2013-07-04 LAB — BASIC METABOLIC PANEL
BUN: 13 mg/dL (ref 6–23)
CALCIUM: 8.8 mg/dL (ref 8.4–10.5)
CO2: 32 mEq/L (ref 19–32)
Chloride: 103 mEq/L (ref 96–112)
Creatinine, Ser: 1.1 mg/dL (ref 0.4–1.5)
GFR: 69.53 mL/min (ref >60.00)
Glucose, Bld: 152 mg/dL — ABNORMAL HIGH (ref 70–99)
Potassium: 3.6 mEq/L (ref 3.5–5.1)
Sodium: 140 mEq/L (ref 135–145)

## 2013-07-04 LAB — TSH: TSH: 0.65 u[IU]/mL (ref 0.35–5.50)

## 2013-07-04 LAB — GLUCOSE, POCT (MANUAL RESULT ENTRY): POC Glucose: 196 mg/dl — AB (ref 70–99)

## 2013-07-04 LAB — HEMOGLOBIN A1C: HEMOGLOBIN A1C: 6.8 % — AB (ref 4.6–6.5)

## 2013-07-04 MED ORDER — CYANOCOBALAMIN 1000 MCG/ML IJ SOLN
1000.0000 ug | Freq: Once | INTRAMUSCULAR | Status: AC
  Administered 2013-07-04: 1000 ug via INTRAMUSCULAR

## 2013-07-04 NOTE — Progress Notes (Signed)
Pre visit review using our clinic review tool, if applicable. No additional management support is needed unless otherwise documented below in the visit note. 

## 2013-07-04 NOTE — Progress Notes (Signed)
Subjective:    HPI  F/u edema and wt gain F/u PNA on CXR - finished Levaquin  The patient presents for a follow-up of  chronic hypertension, chronic dyslipidemia, type 2 diabetes controlled with medicines, paranoid disorder, dementia  [name] is living at [facility]. Hospice is involved.  BP Readings from Last 3 Encounters:  07/04/13 130/76  06/20/13 119/61  06/06/13 200/110   Wt Readings from Last 3 Encounters:  07/04/13 124 lb (56.246 kg)  06/06/13 142 lb (64.411 kg)  05/10/13 131 lb 6.3 oz (59.6 kg)          Review of Systems  Constitutional: Positive for unexpected weight change. Negative for appetite change and fatigue.  HENT: Negative for congestion, nosebleeds, sneezing, sore throat and trouble swallowing.   Eyes: Negative for itching and visual disturbance.  Respiratory: Negative for cough.   Cardiovascular: Positive for leg swelling. Negative for chest pain and palpitations.  Gastrointestinal: Negative for nausea, diarrhea, blood in stool and abdominal distention.  Genitourinary: Negative for frequency and hematuria.  Musculoskeletal: Negative for back pain, gait problem, joint swelling and neck pain.  Skin: Negative for rash.  Neurological: Positive for weakness. Negative for dizziness, tremors, speech difficulty and light-headedness.  Psychiatric/Behavioral: Positive for behavioral problems, confusion, sleep disturbance, self-injury, dysphoric mood and decreased concentration. Negative for suicidal ideas and agitation. The patient is nervous/anxious.        Objective:   Physical Exam  Constitutional: He is oriented to person, place, and time. He appears well-developed. No distress.  NAD In a w/c  HENT:  Mouth/Throat: Oropharynx is clear and moist.  Eyes: Conjunctivae are normal. Pupils are equal, round, and reactive to light.  Neck: Normal range of motion. No JVD present. No thyromegaly present.  Cardiovascular: Normal rate, regular rhythm, normal  heart sounds and intact distal pulses.  Exam reveals no gallop and no friction rub.   No murmur heard. Pulmonary/Chest: Effort normal and breath sounds normal. No respiratory distress. He has no wheezes. He has no rales. He exhibits no tenderness.  Abdominal: Soft. Bowel sounds are normal. He exhibits no distension and no mass. There is no tenderness. There is no rebound and no guarding.  Musculoskeletal: Normal range of motion. He exhibits edema (1+ B). He exhibits no tenderness.  Lymphadenopathy:    He has no cervical adenopathy.  Neurological: He is alert and oriented to person, place, and time. He has normal reflexes. No cranial nerve deficit. He exhibits normal muscle tone. He displays a negative Romberg sign. Coordination abnormal. Gait normal.  No meningeal signs  Skin: Skin is warm and dry. No rash noted. No erythema.  Psychiatric: Judgment normal. His affect is blunt. His affect is not angry. He is slowed. Thought content is paranoid and delusional. Cognition and memory are impaired.   Confused LS is tender Can't stand, weak - he is in a w/c  Lab Results  Component Value Date   WBC 4.4 06/20/2013   HGB 13.0 06/20/2013   HCT 38.0* 06/20/2013   PLT 233 06/20/2013   GLUCOSE 124* 06/20/2013   CHOL 174 03/09/2013   TRIG 69.0 03/09/2013   HDL 41.00 03/09/2013   LDLDIRECT 141.6 07/11/2007   LDLCALC 119* 03/09/2013   ALT 10 04/24/2013   AST 12 04/24/2013   NA 142 06/20/2013   K 3.7 06/20/2013   CL 105 06/20/2013   CREATININE 1.08 06/20/2013   BUN 18 06/20/2013   CO2 27 06/20/2013   TSH 1.240 04/24/2013   PSA 1.29 03/09/2013  INR 1.00 09/24/2012   HGBA1C 6.1 03/09/2013          Assessment & Plan:

## 2013-07-04 NOTE — Assessment & Plan Note (Signed)
Continue with current prescription prn therapy as reflected on the Med list.  

## 2013-07-04 NOTE — Assessment & Plan Note (Addendum)
Off rx 

## 2013-07-04 NOTE — Assessment & Plan Note (Signed)
Alzheimer's disease - progressing 2/13 -paranoid ideation

## 2013-07-04 NOTE — Telephone Encounter (Signed)
Caller requesting to know if pt is healthy enough to sit through a hearing next week. If not, she is requesting a letter stating why. Caller represents pt's wife, Mrs. Cuthrell. Please advise.

## 2013-07-04 NOTE — Assessment & Plan Note (Signed)
Continue with current prescription therapy as reflected on the Med list.  

## 2013-07-04 NOTE — Telephone Encounter (Signed)
I saw him last on 06/06/13. I doubt he can sit through a hearing. He is very weak. Who this letter needs to go to? Thx

## 2013-07-04 NOTE — Telephone Encounter (Signed)
He is on Clonazepam per our chart. OK to ref x3 mo Thx

## 2013-07-04 NOTE — Assessment & Plan Note (Signed)
Better - finished Levaquin

## 2013-07-04 NOTE — Telephone Encounter (Signed)
The law firm representing Mrs. Ivanoff is who called.   Also--[facility] is requesting lorazepam Rx be faxed to them for pt if he was not given one at [facility]. Please advise. Fax to [phone]

## 2013-07-05 ENCOUNTER — Other Ambulatory Visit: Payer: Self-pay | Admitting: *Deleted

## 2013-07-05 ENCOUNTER — Telehealth: Payer: Self-pay | Admitting: *Deleted

## 2013-07-05 MED ORDER — CLONAZEPAM 1 MG PO TABS: 1.0000 mg | ORAL_TABLET | Freq: Three times a day (TID) | ORAL | Status: DC

## 2013-07-05 NOTE — Telephone Encounter (Signed)
Spouse phoned in inquiring if assisted facility that patient is in had called requesting his klor-con (potassium), then stated she thinks they must've meant his clonopin....she will call back with clarification.

## 2013-07-05 NOTE — Telephone Encounter (Signed)
Faxed letter from MD dated 07/05/13 to above per MD.

## 2013-07-05 NOTE — Telephone Encounter (Signed)
Error below- request was for Clonazepam. Will fax clonazepam rx to [facility].

## 2013-07-11 ENCOUNTER — Encounter: Payer: Self-pay | Admitting: Internal Medicine

## 2013-07-31 ENCOUNTER — Encounter (HOSPITAL_COMMUNITY): Payer: Self-pay | Admitting: Emergency Medicine

## 2013-07-31 ENCOUNTER — Emergency Department (HOSPITAL_COMMUNITY)

## 2013-07-31 ENCOUNTER — Emergency Department (HOSPITAL_COMMUNITY)
Admission: EM | Admit: 2013-07-31 | Discharge: 2013-08-01 | Disposition: A | Discharge status: Home / Self Care | Attending: Emergency Medicine | Admitting: Emergency Medicine

## 2013-07-31 DIAGNOSIS — R262 Difficulty in walking, not elsewhere classified: Secondary | ICD-10-CM | POA: Insufficient documentation

## 2013-07-31 DIAGNOSIS — W19XXXA Unspecified fall, initial encounter: Secondary | ICD-10-CM

## 2013-07-31 DIAGNOSIS — Z791 Long term (current) use of non-steroidal anti-inflammatories (NSAID): Secondary | ICD-10-CM | POA: Insufficient documentation

## 2013-07-31 DIAGNOSIS — S0990XA Unspecified injury of head, initial encounter: Secondary | ICD-10-CM | POA: Insufficient documentation

## 2013-07-31 DIAGNOSIS — F329 Major depressive disorder, single episode, unspecified: Secondary | ICD-10-CM | POA: Insufficient documentation

## 2013-07-31 DIAGNOSIS — Z860100 Personal history of colon polyps, unspecified: Secondary | ICD-10-CM | POA: Insufficient documentation

## 2013-07-31 DIAGNOSIS — S8002XA Contusion of left knee, initial encounter: Secondary | ICD-10-CM

## 2013-07-31 DIAGNOSIS — S7002XA Contusion of left hip, initial encounter: Secondary | ICD-10-CM

## 2013-07-31 DIAGNOSIS — S7000XA Contusion of unspecified hip, initial encounter: Secondary | ICD-10-CM | POA: Insufficient documentation

## 2013-07-31 DIAGNOSIS — Z9181 History of falling: Secondary | ICD-10-CM | POA: Insufficient documentation

## 2013-07-31 DIAGNOSIS — Z8639 Personal history of other endocrine, nutritional and metabolic disease: Secondary | ICD-10-CM | POA: Insufficient documentation

## 2013-07-31 DIAGNOSIS — I10 Essential (primary) hypertension: Secondary | ICD-10-CM | POA: Insufficient documentation

## 2013-07-31 DIAGNOSIS — M1990 Unspecified osteoarthritis, unspecified site: Secondary | ICD-10-CM | POA: Insufficient documentation

## 2013-07-31 DIAGNOSIS — F3289 Other specified depressive episodes: Secondary | ICD-10-CM | POA: Insufficient documentation

## 2013-07-31 DIAGNOSIS — F411 Generalized anxiety disorder: Secondary | ICD-10-CM | POA: Insufficient documentation

## 2013-07-31 DIAGNOSIS — S61409A Unspecified open wound of unspecified hand, initial encounter: Secondary | ICD-10-CM | POA: Insufficient documentation

## 2013-07-31 DIAGNOSIS — R296 Repeated falls: Secondary | ICD-10-CM | POA: Insufficient documentation

## 2013-07-31 DIAGNOSIS — I498 Other specified cardiac arrhythmias: Secondary | ICD-10-CM | POA: Insufficient documentation

## 2013-07-31 DIAGNOSIS — Y9389 Activity, other specified: Secondary | ICD-10-CM | POA: Insufficient documentation

## 2013-07-31 DIAGNOSIS — Z8719 Personal history of other diseases of the digestive system: Secondary | ICD-10-CM | POA: Insufficient documentation

## 2013-07-31 DIAGNOSIS — G8929 Other chronic pain: Secondary | ICD-10-CM | POA: Insufficient documentation

## 2013-07-31 DIAGNOSIS — F0390 Unspecified dementia without behavioral disturbance: Secondary | ICD-10-CM | POA: Insufficient documentation

## 2013-07-31 DIAGNOSIS — S61412A Laceration without foreign body of left hand, initial encounter: Secondary | ICD-10-CM

## 2013-07-31 DIAGNOSIS — Y9210 Unspecified residential institution as the place of occurrence of the external cause: Secondary | ICD-10-CM | POA: Insufficient documentation

## 2013-07-31 DIAGNOSIS — E1149 Type 2 diabetes mellitus with other diabetic neurological complication: Secondary | ICD-10-CM | POA: Insufficient documentation

## 2013-07-31 DIAGNOSIS — M25519 Pain in unspecified shoulder: Secondary | ICD-10-CM | POA: Insufficient documentation

## 2013-07-31 DIAGNOSIS — R011 Cardiac murmur, unspecified: Secondary | ICD-10-CM | POA: Insufficient documentation

## 2013-07-31 DIAGNOSIS — Z87891 Personal history of nicotine dependence: Secondary | ICD-10-CM | POA: Insufficient documentation

## 2013-07-31 DIAGNOSIS — Z87448 Personal history of other diseases of urinary system: Secondary | ICD-10-CM | POA: Insufficient documentation

## 2013-07-31 DIAGNOSIS — S8000XA Contusion of unspecified knee, initial encounter: Secondary | ICD-10-CM | POA: Insufficient documentation

## 2013-07-31 DIAGNOSIS — E1142 Type 2 diabetes mellitus with diabetic polyneuropathy: Secondary | ICD-10-CM | POA: Insufficient documentation

## 2013-07-31 DIAGNOSIS — Z79899 Other long term (current) drug therapy: Secondary | ICD-10-CM | POA: Insufficient documentation

## 2013-07-31 MED ORDER — HYDROCODONE-ACETAMINOPHEN 5-325 MG PO TABS
1.0000 | ORAL_TABLET | Freq: Once | ORAL | Status: AC
  Administered 2013-07-31: 1 via ORAL
  Filled 2013-07-31: qty 1

## 2013-07-31 NOTE — ED Provider Notes (Signed)
Medical screening examination/treatment/procedure(s) were conducted as a shared visit with non-physician practitioner(s) and myself.  I personally evaluated the patient during the encounter.  EKG Interpretation   None      Pt seems to recall fall well without apparent amnesia; acting at baseline per family in ED.  Babette Relic, MD 08/04/13 [phone]

## 2013-07-31 NOTE — ED Notes (Signed)
Per EMS  pt had an un witnessed fall at [facility]. EMS found pt in front of room door (blocking door). Pt c/o left hip pain. No obvious trauma noted. Skin tear noted to right hand. Pt alert and baseline, MAE randomly.

## 2013-07-31 NOTE — ED Notes (Signed)
Bed: YK59 Expected date:  Expected time:  Means of arrival:  Comments: EMS 80yo M fall

## 2013-07-31 NOTE — ED Provider Notes (Signed)
CSN: [id number]     Arrival date & time 07/31/13  2130 History   First MD Initiated Contact with Patient 07/31/13 2132     Chief Complaint  Patient presents with  . Fall   (Consider location/radiation/quality/duration/timing/severity/associated sxs/prior Treatment) HPI [name] is a 80 y.o. male who presents to Emergency Department after an unwitnessed fall at his NH facility. Per wife, pt normally ambulates with a walker, or a wheel chair. Hx of unsteady gate and frequent falls. Today, per wife, he has had increased difficulty walking due to chronic left foot pain. Wife states she left for the day to go home, and pt apparently got up, and fell. Pt fell blocking the door. Pt states he remembers falling because had trouble turning with the walker. Pt was unable to get up. EMS had to help pt up on the stretcher. Pt states he hit back of his head on the ground. States pain in left hip and left knee. Pt denies remembering feeling dizzy. Currently denies any chest pain, abdominal pain, back pain.   Past Medical History  Diagnosis Date  . Carotid artery stenosis   . Gait disturbance   . Anxiety   . GERD (gastroesophageal reflux disease)   . Vitamin B12 deficiency   . Depression   . Osteoarthritis   . Diabetes mellitus, type 2   . Hypertension   . Vitamin D deficiency   . Tremor   . Aortic stenosis     murmur  . Hyperlipidemia   . Diverticulosis   . Adenomatous polyp of colon   . Memory loss   . Polyneuropathy in diabetes(357.2)   . Dementia   . Dysrhythmia   . BPH (benign prostatic hyperplasia)    Past Surgical History  Procedure Laterality Date  . Vasectomy    . Lumbar laminectomy      L5 Dr Ophelia Charter  . Transurethral resection of prostate  March 2007  . Cataract extraction, bilateral Bilateral   . Tonsillectomy and adenoidectomy    . Left foot      fracture of left foot   Family History  Problem Relation Age of Onset  . Diabetes Father   . Colon cancer Brother   .  Heart disease Brother   . Diabetes Brother   . Parkinson's disease Sister   . Arthritis Sister   . Alzheimer's disease Sister    History  Substance Use Topics  . Smoking status: Former Smoker    Types: Cigars  . Smokeless tobacco: Never Used  . Alcohol Use: No    Review of Systems  Constitutional: Negative for fever and chills.  Respiratory: Negative for cough, chest tightness and shortness of breath.   Cardiovascular: Negative for chest pain, palpitations and leg swelling.  Gastrointestinal: Negative for nausea, vomiting, abdominal pain, diarrhea and abdominal distention.  Genitourinary: Negative for dysuria, urgency, frequency and hematuria.  Musculoskeletal: Positive for arthralgias and gait problem. Negative for neck pain and neck stiffness.  Skin: Negative for rash.  Allergic/Immunologic: Negative for immunocompromised state.  Neurological: Positive for headaches. Negative for dizziness, weakness, light-headedness and numbness.    Allergies  Citalopram hydrobromide and Duloxetine  Home Medications   Current Outpatient Rx  Name  Route  Sig  Dispense  Refill  . acetaminophen (TYLENOL) 325 MG tablet   Oral   Take 650 mg by mouth every 6 (six) hours as needed.         . clonazePAM (KLONOPIN) 1 MG tablet   Oral  Take 1 tablet (1 mg total) by mouth 3 (three) times daily.   90 tablet   1   . diclofenac sodium (VOLTAREN) 1 % GEL   Topical   Apply 2 g topically 2 (two) times daily as needed (pain left ankle and left hip). For pain         . donepezil (ARICEPT) 5 MG tablet   Oral   Take 1 tablet (5 mg total) by mouth at bedtime.   90 tablet   2   . furosemide (LASIX) 40 MG tablet   Oral   Take 40 mg by mouth daily.         Marland Kitchen losartan (COZAAR) 50 MG tablet   Oral   Take 1 tablet (50 mg total) by mouth daily.   30 tablet   5   . Morphine Sulfate (MORPHINE CONCENTRATE) 10 mg / 0.5 ml concentrated solution   Oral   Take 0.25 mLs (5 mg total) by mouth  every 2 (two) hours as needed for moderate pain or shortness of breath.   30 mL   0   . ondansetron (ZOFRAN ODT) 4 MG disintegrating tablet   Oral   Take 1 tablet (4 mg total) by mouth every 8 (eight) hours as needed for nausea.   20 tablet   0   . potassium chloride (KLOR-CON 10) 10 MEQ tablet   Oral   Take 1 tablet (10 mEq total) by mouth daily.   30 tablet   5   . risperiDONE (RISPERDAL) 1 MG/ML oral solution   Oral   Take 1 mL (1 mg total) by mouth 2 (two) times daily.   30 mL   0   . senna (SENOKOT) 8.6 MG TABS tablet   Oral   Take 1-2 tablets by mouth daily as needed for mild constipation.         . sertraline (ZOLOFT) 25 MG tablet   Oral   Take 25 mg by mouth daily.         Marland Kitchen zolpidem (AMBIEN) 10 MG tablet   Oral   Take 1-1.5 tablets (10-15 mg total) by mouth at bedtime as needed for sleep.   30 tablet   3    BP 161/60  Pulse 54  Temp(Src) 97.9 F (36.6 C) (Oral)  Resp 16  SpO2 96% Physical Exam  Nursing note and vitals reviewed. Constitutional: He is oriented to person, place, and time. He appears well-developed and well-nourished. No distress.  HENT:  Head: Normocephalic and atraumatic.  Right Ear: External ear normal.  Left Ear: External ear normal.  Mouth/Throat: Oropharynx is clear and moist.  Eyes: Conjunctivae are normal. Pupils are equal, round, and reactive to light.  Neck: Normal range of motion. Neck supple.  Cardiovascular: Normal rate and regular rhythm.   Murmur heard. Pulmonary/Chest: Effort normal. No respiratory distress. He has no wheezes. He has no rales.  Abdominal: Soft. Bowel sounds are normal. He exhibits no distension. There is no tenderness. There is no rebound.  Musculoskeletal: He exhibits no edema.  Tender to the left hip, over greater trochanter. Pain with ROM. Full ROM passively with flexion, internal and external rotation.  Normal appearance. Normal appearing left knee with full ROM. No tenderness. Tender over left  ankle, pain with ROM.   Neurological: He is alert and oriented to person, place, and time. No cranial nerve deficit. Coordination normal.  Skin: Skin is warm and dry.  Skin tear to the dorsal left hand  ED Course  Procedures (including critical care time) Labs Review Labs Reviewed - No data to display Imaging Review Dg Hip Complete Left  07/31/2013   CLINICAL DATA:  Fall.  Left hip pain  EXAM: LEFT HIP - COMPLETE 2+ VIEW  COMPARISON:  04/23/2013  FINDINGS: No acute hip fracture or dislocation. No acute pelvic ring fracture. Remote superior ramus fracture on the right. Advanced degenerative disc disease throughout the imaged lumbar spine. The appearance of the descending and sigmoid colon is consistent with diverticulosis.  IMPRESSION: No acute osseous abnormality.   Electronically Signed   By: Jorje Guild M.D.   On: 07/31/2013 22:27   Dg Ankle Complete Left  07/31/2013   CLINICAL DATA:  Fall.  Ankle pain  EXAM: LEFT ANKLE COMPLETE - 3+ VIEW  COMPARISON:  12/20/2006  FINDINGS: Remote ankle fracture with healed lateral posterior and medial angulation. There is advanced posttraumatic tibiotalar osteoarthritis. No acute abnormality such as fracture or dislocation. Second toe amputation incidentally noted. Extensive arterial and soft tissue calcification.  IMPRESSION: 1. No acute osseous abnormality. 2. Remote distal tibia and fibular fractures with advanced posttraumatic tibiotalar osteoarthritis.   Electronically Signed   By: Jorje Guild M.D.   On: 07/31/2013 22:31   Ct Head Wo Contrast  07/31/2013   CLINICAL DATA:  Fall.  EXAM: CT HEAD WITHOUT CONTRAST  CT CERVICAL SPINE WITHOUT CONTRAST  TECHNIQUE: Multidetector CT imaging of the head and cervical spine was performed following the standard protocol without intravenous contrast. Multiplanar CT image reconstructions of the cervical spine were also generated.  COMPARISON:  Head and cervical spine CT scan 04/22/2013.  FINDINGS: CT HEAD FINDINGS   There is cortical atrophy and chronic microvascular ischemic change, stable in appearance. No evidence of acute intracranial abnormality including infarction, hemorrhage, mass lesion, mass effect, midline shift or abnormal extra-axial fluid collection is identified. Mild mucosal thickening right maxillary sinus is noted. No hydrocephalus or pneumocephalus. The calvarium is intact.  CT CERVICAL SPINE FINDINGS  No fracture or malalignment of the cervical spine is identified. There is multilevel degenerative disc disease and facet arthropathy. Lung apices are clear.  IMPRESSION: No acute finding head or cervical spine.  Atrophy and chronic microvascular ischemic change.  Cervical spondylosis.   Electronically Signed   By: Inge Rise M.D.   On: 07/31/2013 22:31   Ct Cervical Spine Wo Contrast  07/31/2013   CLINICAL DATA:  Fall.  EXAM: CT HEAD WITHOUT CONTRAST  CT CERVICAL SPINE WITHOUT CONTRAST  TECHNIQUE: Multidetector CT imaging of the head and cervical spine was performed following the standard protocol without intravenous contrast. Multiplanar CT image reconstructions of the cervical spine were also generated.  COMPARISON:  Head and cervical spine CT scan 04/22/2013.  FINDINGS: CT HEAD FINDINGS  There is cortical atrophy and chronic microvascular ischemic change, stable in appearance. No evidence of acute intracranial abnormality including infarction, hemorrhage, mass lesion, mass effect, midline shift or abnormal extra-axial fluid collection is identified. Mild mucosal thickening right maxillary sinus is noted. No hydrocephalus or pneumocephalus. The calvarium is intact.  CT CERVICAL SPINE FINDINGS  No fracture or malalignment of the cervical spine is identified. There is multilevel degenerative disc disease and facet arthropathy. Lung apices are clear.  IMPRESSION: No acute finding head or cervical spine.  Atrophy and chronic microvascular ischemic change.  Cervical spondylosis.   Electronically Signed    By: Inge Rise M.D.   On: 07/31/2013 22:31   Dg Knee Complete 4 Views Left  07/31/2013  CLINICAL DATA:  Fall with left knee pain.  EXAM: LEFT KNEE - COMPLETE 4+ VIEW  COMPARISON:  None.  FINDINGS: No acute fracture, malalignment, or joint effusion. Diffuse arterial calcification. No significant degenerative change for age. Mild chondrocalcinosis. Osteopenia.  IMPRESSION: No acute osseous abnormality.   Electronically Signed   By: Jorje Guild M.D.   On: 07/31/2013 22:28    EKG Interpretation   None       MDM   1. Fall   2. Contusion of hip, left   3. Skin tear of left hand without complication   4. Contusion of left knee   5. Minor head injury     Patient in emergency department after what sounds like a mechanical fall. He is complaining of headache, left hip in knee pain. Has had some ankle pain in the last several days which he states most likely cause him to fall. X-rays obtained and are all unremarkable. Patient has followup scheduled tomorrow with his primary care Dr. Patient denies any dizziness, generalized weakness, chest pain, shortness of breath. His heart rate was found to be bradycardic, records reviewed and he does have history of bradycardia in the past. His blood pressure is normal here. There is no increased dizziness or change in HR with standing in ED. Discussed and also seen by Dr. Stevie Kern. I will discharge him home with followup tomorrow the primary care physician as scheduled. Pt is able to ambulate with assistance in emergency department.   Filed Vitals:   07/31/13 2118 07/31/13 2125 07/31/13 2247  BP:  161/60 167/63  Pulse:  54 48  Temp:  97.9 F (36.6 C)   TempSrc:  Oral   Resp:  16 16  SpO2: 98% 96% 96%     Radford Pease A Verline Kong, PA-C 07/31/13 2329

## 2013-07-31 NOTE — ED Notes (Signed)
Return from radiology. Wife at bedside.

## 2013-07-31 NOTE — Discharge Instructions (Signed)
Continue all regular medications and fall precautions. Follow up with primary care doctor as scheduled.    Fall Prevention and Home Safety Falls cause injuries and can affect all age groups. It is possible to use preventive measures to significantly decrease the likelihood of falls. There are many simple measures which can make your home safer and prevent falls. OUTDOORS  Repair cracks and edges of walkways and driveways.  Remove high doorway thresholds.  Trim shrubbery on the main path into your home.  Have good outside lighting.  Clear walkways of tools, rocks, debris, and clutter.  Check that handrails are not broken and are securely fastened. Both sides of steps should have handrails.  Have leaves, snow, and ice cleared regularly.  Use sand or salt on walkways during winter months.  In the garage, clean up grease or oil spills. BATHROOM  Install night lights.  Install grab bars by the toilet and in the tub and shower.  Use non-skid mats or decals in the tub or shower.  Place a plastic non-slip stool in the shower to sit on, if needed.  Keep floors dry and clean up all water on the floor immediately.  Remove soap buildup in the tub or shower on a regular basis.  Secure bath mats with non-slip, double-sided rug tape.  Remove throw rugs and tripping hazards from the floors. BEDROOMS  Install night lights.  Make sure a bedside light is easy to reach.  Do not use oversized bedding.  Keep a telephone by your bedside.  Have a firm chair with side arms to use for getting dressed.  Remove throw rugs and tripping hazards from the floor. KITCHEN  Keep handles on pots and pans turned toward the center of the stove. Use back burners when possible.  Clean up spills quickly and allow time for drying.  Avoid walking on wet floors.  Avoid hot utensils and knives.  Position shelves so they are not too high or low.  Place commonly used objects within easy  reach.  If necessary, use a sturdy step stool with a grab bar when reaching.  Keep electrical cables out of the way.  Do not use floor polish or wax that makes floors slippery. If you must use wax, use non-skid floor wax.  Remove throw rugs and tripping hazards from the floor. STAIRWAYS  Never leave objects on stairs.  Place handrails on both sides of stairways and use them. Fix any loose handrails. Make sure handrails on both sides of the stairways are as long as the stairs.  Check carpeting to make sure it is firmly attached along stairs. Make repairs to worn or loose carpet promptly.  Avoid placing throw rugs at the top or bottom of stairways, or properly secure the rug with carpet tape to prevent slippage. Get rid of throw rugs, if possible.  Have an electrician put in a light switch at the top and bottom of the stairs. OTHER FALL PREVENTION TIPS  Wear low-heel or rubber-soled shoes that are supportive and fit well. Wear closed toe shoes.  When using a stepladder, make sure it is fully opened and both spreaders are firmly locked. Do not climb a closed stepladder.  Add color or contrast paint or tape to grab bars and handrails in your home. Place contrasting color strips on first and last steps.  Learn and use mobility aids as needed. Install an electrical emergency response system.  Turn on lights to avoid dark areas. Replace light bulbs that burn out immediately.  Get light switches that glow.  Arrange furniture to create clear pathways. Keep furniture in the same place.  Firmly attach carpet with non-skid or double-sided tape.  Eliminate uneven floor surfaces.  Select a carpet pattern that does not visually hide the edge of steps.  Be aware of all pets. OTHER HOME SAFETY TIPS  Set the water temperature for 120 F (48.8 C).  Keep emergency numbers on or near the telephone.  Keep smoke detectors on every level of the home and near sleeping areas. Document Released:  06/05/2002 Document Revised: 12/15/2011 Document Reviewed: 09/04/2011 [facility] Patient Information 2014 [place].

## 2013-08-01 ENCOUNTER — Encounter: Payer: Self-pay | Admitting: Internal Medicine

## 2013-08-01 ENCOUNTER — Ambulatory Visit (INDEPENDENT_AMBULATORY_CARE_PROVIDER_SITE_OTHER): Payer: Medicare Other | Admitting: Internal Medicine

## 2013-08-01 VITALS — BP 172/80 | HR 64 | Temp 98.2°F | Resp 16 | Wt 134.0 lb

## 2013-08-01 DIAGNOSIS — I10 Essential (primary) hypertension: Secondary | ICD-10-CM

## 2013-08-01 DIAGNOSIS — M545 Low back pain, unspecified: Secondary | ICD-10-CM

## 2013-08-01 DIAGNOSIS — F411 Generalized anxiety disorder: Secondary | ICD-10-CM

## 2013-08-01 DIAGNOSIS — W19XXXA Unspecified fall, initial encounter: Secondary | ICD-10-CM

## 2013-08-01 DIAGNOSIS — E119 Type 2 diabetes mellitus without complications: Secondary | ICD-10-CM

## 2013-08-01 DIAGNOSIS — Y92009 Unspecified place in unspecified non-institutional (private) residence as the place of occurrence of the external cause: Secondary | ICD-10-CM

## 2013-08-01 DIAGNOSIS — E538 Deficiency of other specified B group vitamins: Secondary | ICD-10-CM

## 2013-08-01 DIAGNOSIS — F0390 Unspecified dementia without behavioral disturbance: Secondary | ICD-10-CM

## 2013-08-01 LAB — GLUCOSE, POCT (MANUAL RESULT ENTRY): POC GLUCOSE: 127 mg/dL — AB (ref 70–99)

## 2013-08-01 MED ORDER — CYANOCOBALAMIN 1000 MCG/ML IJ SOLN
1000.0000 ug | Freq: Once | INTRAMUSCULAR | Status: AC
  Administered 2013-08-01: 1000 ug via INTRAMUSCULAR

## 2013-08-01 NOTE — Assessment & Plan Note (Signed)
He was seen in the ER last night for a  1.  Fall   2.  Contusion of hip, left   3.  Skin tear of left hand without complication   4.  Contusion of left knee   5.  Minor head injury   Patient in emergency department after what sounds like a mechanical fall. He is complaining of headache, left hip in knee pain. Has had some ankle pain in the last several days which he states most likely cause him to fall. X-rays obtained and are all unremarkable.  Will watch

## 2013-08-01 NOTE — Assessment & Plan Note (Signed)
Continue with current prescription therapy as reflected on the Med list.  

## 2013-08-01 NOTE — Progress Notes (Signed)
Subjective:    HPI  He was seen in the ER last night for a  1.  Fall   2.  Contusion of hip, left   3.  Skin tear of left hand without complication   4.  Contusion of left knee   5.  Minor head injury   Patient in emergency department after what sounds like a mechanical fall. He is complaining of headache, left hip in knee pain. Has had some ankle pain in the last several days which he states most likely cause him to fall. X-rays obtained and are all unremarkable.   F/u edema and wt gain F/u PNA on CXR - finished Levaquin  The patient presents for a follow-up of  chronic hypertension, chronic dyslipidemia, type 2 diabetes controlled with medicines, paranoid disorder, dementia  [name] is living at [facility]. Hospice is involved.  BP Readings from Last 3 Encounters:  08/01/13 172/80  07/31/13 177/76  07/04/13 130/76   Wt Readings from Last 3 Encounters:  08/01/13 134 lb (60.782 kg)  07/04/13 124 lb (56.246 kg)  06/06/13 142 lb (64.411 kg)          Review of Systems  Constitutional: Positive for unexpected weight change. Negative for appetite change and fatigue.  HENT: Negative for congestion, nosebleeds, sneezing, sore throat and trouble swallowing.   Eyes: Negative for itching and visual disturbance.  Respiratory: Negative for cough.   Cardiovascular: Positive for leg swelling. Negative for chest pain and palpitations.  Gastrointestinal: Negative for nausea, diarrhea, blood in stool and abdominal distention.  Genitourinary: Negative for frequency and hematuria.  Musculoskeletal: Negative for back pain, gait problem, joint swelling and neck pain.  Skin: Negative for rash.  Neurological: Positive for weakness. Negative for dizziness, tremors, speech difficulty and light-headedness.  Psychiatric/Behavioral: Positive for behavioral problems, confusion, sleep disturbance, self-injury, dysphoric mood and decreased concentration. Negative for suicidal ideas and  agitation. The patient is nervous/anxious.   in a w/c Groggy     Objective:   Physical Exam  Constitutional: He is oriented to person, place, and time. He appears well-developed. No distress.  NAD In a w/c  HENT:  Mouth/Throat: Oropharynx is clear and moist.  Eyes: Conjunctivae are normal. Pupils are equal, round, and reactive to light.  Neck: Normal range of motion. No JVD present. No thyromegaly present.  Cardiovascular: Normal rate, regular rhythm, normal heart sounds and intact distal pulses.  Exam reveals no gallop and no friction rub.   No murmur heard. Pulmonary/Chest: Effort normal and breath sounds normal. No respiratory distress. He has no wheezes. He has no rales. He exhibits no tenderness.  Abdominal: Soft. Bowel sounds are normal. He exhibits no distension and no mass. There is no tenderness. There is no rebound and no guarding.  Musculoskeletal: Normal range of motion. He exhibits edema (1+ B). He exhibits no tenderness.  Lymphadenopathy:    He has no cervical adenopathy.  Neurological: He is alert and oriented to person, place, and time. He has normal reflexes. No cranial nerve deficit. He exhibits normal muscle tone. He displays a negative Romberg sign. Coordination abnormal. Gait normal.  No meningeal signs  Skin: Skin is warm and dry. No rash noted. No erythema.  Psychiatric: Judgment normal. His affect is blunt. His affect is not angry. He is slowed. Thought content is paranoid and delusional. Cognition and memory are impaired.   Confused, groggy LS is tender Can't stand, weak - he is in a w/c  Lab Results  Component Value Date  WBC 6.1 07/04/2013   HGB 11.6* 07/04/2013   HCT 34.2* 07/04/2013   PLT 168.0 07/04/2013   GLUCOSE 152* 07/04/2013   CHOL 174 03/09/2013   TRIG 69.0 03/09/2013   HDL 41.00 03/09/2013   LDLDIRECT 141.6 07/11/2007   LDLCALC 119* 03/09/2013   ALT 10 04/24/2013   AST 12 04/24/2013   NA 140 07/04/2013   K 3.6 07/04/2013   CL 103 07/04/2013   CREATININE  1.1 07/04/2013   BUN 13 07/04/2013   CO2 32 07/04/2013   TSH 0.65 07/04/2013   PSA 1.29 03/09/2013   INR 1.00 09/24/2012   HGBA1C 6.8* 07/04/2013   X rays, head CT       Assessment & Plan:

## 2013-08-01 NOTE — ED Notes (Signed)
Report called to [facility] Pt transported via ptar.

## 2013-08-01 NOTE — Assessment & Plan Note (Signed)
Hospice care

## 2013-08-01 NOTE — Progress Notes (Signed)
Pre visit review using our clinic review tool, if applicable. No additional management support is needed unless otherwise documented below in the visit note. 

## 2013-08-01 NOTE — Patient Instructions (Signed)
Use bedside commode

## 2013-08-01 NOTE — Assessment & Plan Note (Signed)
CBG 127 Continue with current prescription therapy as reflected on the Med list.

## 2013-08-01 NOTE — Assessment & Plan Note (Signed)
Will watch BP 

## 2013-08-04 ENCOUNTER — Telehealth: Payer: Self-pay

## 2013-08-04 NOTE — Telephone Encounter (Signed)
Relevant patient education assigned to patient using Emmi. ° °

## 2013-08-10 ENCOUNTER — Telehealth: Payer: Self-pay | Admitting: Internal Medicine

## 2013-08-10 DIAGNOSIS — M25552 Pain in left hip: Secondary | ICD-10-CM

## 2013-08-10 NOTE — Telephone Encounter (Signed)
[name], Pls have [facility] do portable X rays: 1. L hip x ray 2. Pelvis x ray  Dx L hip pain, fall Thx!

## 2013-08-11 NOTE — Telephone Encounter (Signed)
Confirmed with [facility] orders faxed this morning for xrays were received.

## 2013-08-11 NOTE — Telephone Encounter (Signed)
Orders for xrays entered, printed, and faxed and [facility] @ [phone]

## 2013-08-17 ENCOUNTER — Telehealth: Payer: Self-pay | Admitting: Internal Medicine

## 2013-08-17 NOTE — Telephone Encounter (Signed)
[facility] will re-fax the request for an order.  They need an order for a UA. The phone number at [facility] is [phone]  Fax [phone]

## 2013-08-17 NOTE — Telephone Encounter (Signed)
OK UA Thx

## 2013-08-17 NOTE — Telephone Encounter (Signed)
[name] is at [facility].  They are requesting an antibiotic for [name] for a UTI.  They state they have faxed the request over the past couple of days. Their fax number is [phone]

## 2013-08-18 NOTE — Telephone Encounter (Signed)
Order signed/faxed back to [facility].

## 2013-08-28 ENCOUNTER — Telehealth: Payer: Self-pay | Admitting: *Deleted

## 2013-08-28 NOTE — Telephone Encounter (Signed)
Caller requesting MDs advisement on UA results. Please advise.

## 2013-08-28 NOTE — Telephone Encounter (Signed)
Last UA I saw (fax) was ok Thx

## 2013-08-29 NOTE — Telephone Encounter (Signed)
[facility] informed. They also need a hard script for his Klonopin 1 mg. Ok?

## 2013-09-06 ENCOUNTER — Encounter: Payer: Self-pay | Admitting: Internal Medicine

## 2013-09-19 ENCOUNTER — Encounter: Payer: Self-pay | Admitting: Internal Medicine

## 2013-09-19 ENCOUNTER — Other Ambulatory Visit (INDEPENDENT_AMBULATORY_CARE_PROVIDER_SITE_OTHER): Payer: Medicare Other

## 2013-09-19 ENCOUNTER — Ambulatory Visit (INDEPENDENT_AMBULATORY_CARE_PROVIDER_SITE_OTHER): Admitting: Internal Medicine

## 2013-09-19 VITALS — BP 160/80 | HR 80 | Temp 97.6°F | Resp 16 | Wt 136.0 lb

## 2013-09-19 DIAGNOSIS — E119 Type 2 diabetes mellitus without complications: Secondary | ICD-10-CM

## 2013-09-19 DIAGNOSIS — W19XXXA Unspecified fall, initial encounter: Secondary | ICD-10-CM

## 2013-09-19 DIAGNOSIS — E538 Deficiency of other specified B group vitamins: Secondary | ICD-10-CM

## 2013-09-19 DIAGNOSIS — M545 Low back pain, unspecified: Secondary | ICD-10-CM

## 2013-09-19 DIAGNOSIS — R609 Edema, unspecified: Secondary | ICD-10-CM

## 2013-09-19 DIAGNOSIS — Y92009 Unspecified place in unspecified non-institutional (private) residence as the place of occurrence of the external cause: Secondary | ICD-10-CM

## 2013-09-19 DIAGNOSIS — Z23 Encounter for immunization: Secondary | ICD-10-CM

## 2013-09-19 LAB — BASIC METABOLIC PANEL
BUN: 17 mg/dL (ref 6–23)
CHLORIDE: 101 meq/L (ref 96–112)
CO2: 32 mEq/L (ref 19–32)
CREATININE: 1.1 mg/dL (ref 0.4–1.5)
Calcium: 9.3 mg/dL (ref 8.4–10.5)
GFR: 65.25 mL/min (ref >60.00)
Glucose, Bld: 181 mg/dL — ABNORMAL HIGH (ref 70–99)
POTASSIUM: 3.4 meq/L — AB (ref 3.5–5.1)
Sodium: 138 mEq/L (ref 135–145)

## 2013-09-19 LAB — HEMOGLOBIN A1C: HEMOGLOBIN A1C: 7 % — AB (ref 4.6–6.5)

## 2013-09-19 LAB — GLUCOSE, POCT (MANUAL RESULT ENTRY): POC GLUCOSE: 211 mg/dL — AB (ref 70–99)

## 2013-09-19 MED ORDER — CLONAZEPAM 1 MG PO TABS: 1.0000 mg | ORAL_TABLET | Freq: Three times a day (TID) | ORAL | Status: DC

## 2013-09-19 MED ORDER — CYANOCOBALAMIN 1000 MCG/ML IJ SOLN
1000.0000 ug | Freq: Once | INTRAMUSCULAR | Status: AC
  Administered 2013-09-19: 1000 ug via INTRAMUSCULAR

## 2013-09-19 MED ORDER — ZOLPIDEM TARTRATE 10 MG PO TABS: 10.0000 mg | ORAL_TABLET | Freq: Every evening | ORAL | Status: DC | PRN

## 2013-09-19 NOTE — Assessment & Plan Note (Signed)
Labs CBG 

## 2013-09-19 NOTE — Assessment & Plan Note (Signed)
Continue with current prescription therapy as reflected on the Med list.  

## 2013-09-19 NOTE — Assessment & Plan Note (Signed)
Resolved

## 2013-09-19 NOTE — Progress Notes (Signed)
Pre visit review using our clinic review tool, if applicable. No additional management support is needed unless otherwise documented below in the visit note. 

## 2013-09-19 NOTE — Progress Notes (Signed)
Patient ID: [name], male   DOB: 12/15/1924, 80 y.o.   MRN: [id number]   Subjective:    HPI  C/o a fall last night - no injuries  F/u edema and wt gain F/u PNA on CXR - finished Levaquin  The patient presents for a follow-up of  chronic hypertension, chronic dyslipidemia, type 2 diabetes controlled with medicines, paranoid disorder, dementia  [name] is living at [facility]. Hospice is involved.  BP Readings from Last 3 Encounters:  09/19/13 160/80  08/01/13 172/80  07/31/13 177/76   Wt Readings from Last 3 Encounters:  09/19/13 136 lb (61.689 kg)  08/01/13 134 lb (60.782 kg)  07/04/13 124 lb (56.246 kg)      Review of Systems  Constitutional: Positive for unexpected weight change. Negative for appetite change and fatigue.  HENT: Negative for congestion, nosebleeds, sneezing, sore throat and trouble swallowing.   Eyes: Negative for itching and visual disturbance.  Respiratory: Negative for cough.   Cardiovascular: Positive for leg swelling. Negative for chest pain and palpitations.  Gastrointestinal: Negative for nausea, diarrhea, blood in stool and abdominal distention.  Genitourinary: Negative for frequency and hematuria.  Musculoskeletal: Negative for back pain, gait problem, joint swelling and neck pain.  Skin: Negative for rash.  Neurological: Positive for weakness. Negative for dizziness, tremors, speech difficulty and light-headedness.  Psychiatric/Behavioral: Positive for behavioral problems, confusion, sleep disturbance, self-injury, dysphoric mood and decreased concentration. Negative for suicidal ideas and agitation. The patient is nervous/anxious.        Objective:   Physical Exam  Constitutional: He is oriented to person, place, and time. He appears well-developed. No distress.  NAD In a w/c  HENT:  Mouth/Throat: Oropharynx is clear and moist.  Eyes: Conjunctivae are normal. Pupils are equal, round, and reactive to light.  Neck: Normal range  of motion. No JVD present. No thyromegaly present.  Cardiovascular: Normal rate, regular rhythm, normal heart sounds and intact distal pulses.  Exam reveals no gallop and no friction rub.   No murmur heard. Pulmonary/Chest: Effort normal and breath sounds normal. No respiratory distress. He has no wheezes. He has no rales. He exhibits no tenderness.  Abdominal: Soft. Bowel sounds are normal. He exhibits no distension and no mass. There is no tenderness. There is no rebound and no guarding.  Musculoskeletal: Normal range of motion. He exhibits edema (1+ B). He exhibits no tenderness.  Lymphadenopathy:    He has no cervical adenopathy.  Neurological: He is alert and oriented to person, place, and time. He has normal reflexes. No cranial nerve deficit. He exhibits normal muscle tone. He displays a negative Romberg sign. Coordination abnormal. Gait normal.  No meningeal signs  Skin: Skin is warm and dry. No rash noted. No erythema.  Psychiatric: Judgment normal. His affect is blunt. His affect is not angry. He is slowed. Thought content is paranoid and delusional. Cognition and memory are impaired.   Confused LS is tender Can't stand, weak - he is in a w/c  Lab Results  Component Value Date   WBC 6.1 07/04/2013   HGB 11.6* 07/04/2013   HCT 34.2* 07/04/2013   PLT 168.0 07/04/2013   GLUCOSE 152* 07/04/2013   CHOL 174 03/09/2013   TRIG 69.0 03/09/2013   HDL 41.00 03/09/2013   LDLDIRECT 141.6 07/11/2007   LDLCALC 119* 03/09/2013   ALT 10 04/24/2013   AST 12 04/24/2013   NA 140 07/04/2013   K 3.6 07/04/2013   CL 103 07/04/2013   CREATININE 1.1 07/04/2013  BUN 13 07/04/2013   CO2 32 07/04/2013   TSH 0.65 07/04/2013   PSA 1.29 03/09/2013   INR 1.00 09/24/2012   HGBA1C 6.8* 07/04/2013          Assessment & Plan:

## 2013-09-19 NOTE — Assessment & Plan Note (Signed)
3/124/15 no injuries

## 2013-09-20 ENCOUNTER — Other Ambulatory Visit: Payer: Self-pay | Admitting: Internal Medicine

## 2013-09-20 MED ORDER — POTASSIUM CHLORIDE ER 10 MEQ PO TBCR: 10.0000 meq | EXTENDED_RELEASE_TABLET | Freq: Two times a day (BID) | ORAL | Status: DC

## 2013-09-21 ENCOUNTER — Telehealth: Payer: Self-pay | Admitting: *Deleted

## 2013-09-21 NOTE — Telephone Encounter (Signed)
Sharyn Lull called states she needs an order for the Potassium to be faxed to [phone].  Please advise

## 2013-09-22 NOTE — Telephone Encounter (Signed)
Ok Thx 

## 2013-09-25 ENCOUNTER — Encounter: Payer: Self-pay | Admitting: Neurology

## 2013-09-25 ENCOUNTER — Other Ambulatory Visit: Payer: Self-pay | Admitting: *Deleted

## 2013-09-25 ENCOUNTER — Ambulatory Visit (INDEPENDENT_AMBULATORY_CARE_PROVIDER_SITE_OTHER): Payer: Medicare Other | Admitting: Neurology

## 2013-09-25 VITALS — BP 171/71 | HR 51

## 2013-09-25 DIAGNOSIS — R269 Unspecified abnormalities of gait and mobility: Secondary | ICD-10-CM

## 2013-09-25 DIAGNOSIS — R51 Headache: Secondary | ICD-10-CM | POA: Diagnosis not present

## 2013-09-25 DIAGNOSIS — R413 Other amnesia: Secondary | ICD-10-CM

## 2013-09-25 MED ORDER — CLONAZEPAM 0.5 MG PO TABS: 0.5000 mg | ORAL_TABLET | Freq: Three times a day (TID) | ORAL | Status: DC

## 2013-09-25 MED ORDER — POTASSIUM CHLORIDE ER 10 MEQ PO TBCR: 10.0000 meq | EXTENDED_RELEASE_TABLET | Freq: Two times a day (BID) | ORAL | Status: AC

## 2013-09-25 MED ORDER — ZOLPIDEM TARTRATE 10 MG PO TABS: 10.0000 mg | ORAL_TABLET | Freq: Every day | ORAL | Status: DC

## 2013-09-25 MED ORDER — ARIPIPRAZOLE 5 MG PO TABS: 5.0000 mg | ORAL_TABLET | Freq: Every day | ORAL | Status: DC

## 2013-09-25 MED ORDER — SERTRALINE HCL 50 MG PO TABS: 50.0000 mg | ORAL_TABLET | Freq: Every day | ORAL | Status: DC

## 2013-09-25 NOTE — Progress Notes (Signed)
Reason for visit: Dementia   [name] is an 80 y.o. male  History of present illness:  [name] is an 80 year old right-handed white male with a history of a progressive dementing illness. The patient required hospitalization following a delirium state in November 2014. The patient currently is in an extended care facility, [facility]. The patient has been on Risperdal taking 1 mg twice daily, Zoloft 25 mg daily, and clonazepam 1 mg 3 times daily. The patient is on Aricept taking 5 mg at night. The patient has had a significant gait disorder, with multiple falls. The patient has had recent CT brain scan evaluations following falls. The patient has a tendency to fall backwards. The patient walks with a walker, unable to walk independently. The patient continues to have episodes of agitation. The patient returns for an evaluation.  Past Medical History  Diagnosis Date  . Carotid artery stenosis   . Gait disturbance   . Anxiety   . GERD (gastroesophageal reflux disease)   . Vitamin B12 deficiency   . Depression   . Osteoarthritis   . Diabetes mellitus, type 2   . Hypertension   . Vitamin D deficiency   . Tremor   . Aortic stenosis     murmur  . Hyperlipidemia   . Diverticulosis   . Adenomatous polyp of colon   . Memory loss   . Polyneuropathy in diabetes(357.2)   . Dementia   . Dysrhythmia   . BPH (benign prostatic hyperplasia)     Past Surgical History  Procedure Laterality Date  . Vasectomy    . Lumbar laminectomy      L5 Dr Lorin [facility]  . Transurethral resection of prostate  March 2007  . Cataract extraction, bilateral Bilateral   . Tonsillectomy and adenoidectomy    . Left foot      fracture of left foot    Family History  Problem Relation Age of Onset  . Diabetes Father   . Colon cancer Brother   . Heart disease Brother   . Diabetes Brother   . Parkinson's disease Sister   . Arthritis Sister   . Alzheimer's disease Sister      Social history:  reports that he has quit smoking. His smoking use included Cigars. He has never used smokeless tobacco. He reports that he does not drink alcohol or use illicit drugs.    Allergies  Allergen Reactions  . Citalopram Hydrobromide Other (See Comments)    no reaction, refused to take  . Duloxetine Other (See Comments)    did not like, refused to continue    Medications:  Current Outpatient Prescriptions on File Prior to Visit  Medication Sig Dispense Refill  . acetaminophen (TYLENOL) 325 MG tablet Take 650 mg by mouth every 6 (six) hours as needed.      . diclofenac sodium (VOLTAREN) 1 % GEL Apply 2 g topically 2 (two) times daily as needed (pain left ankle and left hip). For pain      . donepezil (ARICEPT) 5 MG tablet Take 1 tablet (5 mg total) by mouth at bedtime.  90 tablet  2  . furosemide (LASIX) 40 MG tablet Take 40 mg by mouth daily.      Marland Kitchen losartan (COZAAR) 50 MG tablet Take 1 tablet (50 mg total) by mouth daily.  30 tablet  5  . Morphine Sulfate (MORPHINE CONCENTRATE) 10 mg / 0.5 ml concentrated solution Take 0.25 mLs (5 mg total) by mouth every  2 (two) hours as needed for moderate pain or shortness of breath.  30 mL  0  . ondansetron (ZOFRAN ODT) 4 MG disintegrating tablet Take 1 tablet (4 mg total) by mouth every 8 (eight) hours as needed for nausea.  20 tablet  0  . potassium chloride (KLOR-CON 10) 10 MEQ tablet Take 1 tablet (10 mEq total) by mouth 2 (two) times daily.  180 tablet  3  . senna (SENOKOT) 8.6 MG TABS tablet Take 1-2 tablets by mouth daily as needed for mild constipation.       No current facility-administered medications on file prior to visit.    ROS:  Out of a complete 14 system review of symptoms, the patient complains only of the following symptoms, and all other reviewed systems are negative.  Drooling Decreased appetite, fatigue Eye pain, eye itching Incontinence of bladder Achy muscles, walking difficulties Bruising  easily Dizziness, headache, weakness Depression  Blood pressure 171/71, pulse 51, weight 0 lb (0 kg).  Physical Exam  General: The patient is sedated, slow to respond.  Skin: No significant peripheral edema is noted.   Neurologic Exam  Mental status: The patient is following directions.  Cranial nerves: Facial symmetry is present. Speech is normal, no aphasia or dysarthria is noted. Extraocular movements are full. The patient has down the nystagmus. Visual fields are full.  Motor: The patient has good strength in all 4 extremities.  Sensory examination: Soft touch sensation is symmetric on the face, arms, and legs.  Coordination: The patient has good finger-nose-finger bilaterally, but he has apraxia with the use of the lower extremities with heel-to-shin.  Gait and station: The patient requires assistance with standing. Once up, the patient leans backwards, unable to stand independently. The patient is functionally unable to walk.  Reflexes: Deep tendon reflexes are symmetric.   Assessment/Plan:  One. Progressive dementia  2. Secondary parkinsonism  3. Gait disorder  The patient is sedated, and he is ataxic with walking. The patient appears to have features of secondary parkinsonism, and he is on relatively high doses of clonazepam. The patient will be switched from Risperdal to Abilify, taking 5 mg of Abilify at night. The Zoloft will be increased to 50 mg daily, and the clonazepam will be reduced to 0.5 mg 3 times daily. The patient will followup in 5 or 6 months. Unfortunately, the patient continues to have some agitation problems at times.  [name] Alexanders MD 09/25/2013 8:10 PM  [facility] [address] [place], [place] [address]  Phone [phone] Fax [phone]

## 2013-09-25 NOTE — Patient Instructions (Signed)

## 2013-09-25 NOTE — Telephone Encounter (Signed)
Spoke with Sharyn Lull advised Rx faxed.

## 2013-09-26 LAB — SEDIMENTATION RATE: Sed Rate: 20 mm/hr (ref 0–30)

## 2013-09-26 NOTE — Progress Notes (Signed)
Quick Note:  Called and spoke with wife and shared unremarkable labs with patient's wife, she verbalized understanding ______

## 2013-10-01 ENCOUNTER — Encounter: Payer: Self-pay | Admitting: Internal Medicine

## 2013-10-06 ENCOUNTER — Telehealth: Payer: Self-pay | Admitting: Neurology

## 2013-10-06 ENCOUNTER — Encounter: Payer: Self-pay | Admitting: Neurology

## 2013-10-06 NOTE — Telephone Encounter (Signed)
I called the patient and I talked with the wife. The patient has refused taking his medications, increased agitation off the medication. I talked the patient and I advised him to get back on the medication. If he does not, the patient may need to go back to the extended care facility. I'll write a letter sent that the patient needs 24/7 supervision.

## 2013-10-07 ENCOUNTER — Emergency Department (HOSPITAL_COMMUNITY)
Admission: EM | Admit: 2013-10-07 | Discharge: 2013-10-07 | Disposition: A | Discharge status: Home / Self Care | Attending: Emergency Medicine | Admitting: Emergency Medicine

## 2013-10-07 ENCOUNTER — Emergency Department (HOSPITAL_COMMUNITY)

## 2013-10-07 ENCOUNTER — Encounter (HOSPITAL_COMMUNITY): Payer: Self-pay | Admitting: Emergency Medicine

## 2013-10-07 DIAGNOSIS — M542 Cervicalgia: Secondary | ICD-10-CM

## 2013-10-07 DIAGNOSIS — F329 Major depressive disorder, single episode, unspecified: Secondary | ICD-10-CM | POA: Insufficient documentation

## 2013-10-07 DIAGNOSIS — Y9210 Unspecified residential institution as the place of occurrence of the external cause: Secondary | ICD-10-CM | POA: Insufficient documentation

## 2013-10-07 DIAGNOSIS — I10 Essential (primary) hypertension: Secondary | ICD-10-CM | POA: Insufficient documentation

## 2013-10-07 DIAGNOSIS — Z87448 Personal history of other diseases of urinary system: Secondary | ICD-10-CM | POA: Insufficient documentation

## 2013-10-07 DIAGNOSIS — Z87891 Personal history of nicotine dependence: Secondary | ICD-10-CM | POA: Insufficient documentation

## 2013-10-07 DIAGNOSIS — W19XXXA Unspecified fall, initial encounter: Secondary | ICD-10-CM

## 2013-10-07 DIAGNOSIS — Z860100 Personal history of colon polyps, unspecified: Secondary | ICD-10-CM | POA: Insufficient documentation

## 2013-10-07 DIAGNOSIS — Z79899 Other long term (current) drug therapy: Secondary | ICD-10-CM | POA: Insufficient documentation

## 2013-10-07 DIAGNOSIS — F411 Generalized anxiety disorder: Secondary | ICD-10-CM | POA: Insufficient documentation

## 2013-10-07 DIAGNOSIS — S199XXA Unspecified injury of neck, initial encounter: Secondary | ICD-10-CM

## 2013-10-07 DIAGNOSIS — IMO0002 Reserved for concepts with insufficient information to code with codable children: Secondary | ICD-10-CM | POA: Insufficient documentation

## 2013-10-07 DIAGNOSIS — S79919A Unspecified injury of unspecified hip, initial encounter: Secondary | ICD-10-CM | POA: Insufficient documentation

## 2013-10-07 DIAGNOSIS — F3289 Other specified depressive episodes: Secondary | ICD-10-CM | POA: Insufficient documentation

## 2013-10-07 DIAGNOSIS — M1990 Unspecified osteoarthritis, unspecified site: Secondary | ICD-10-CM | POA: Insufficient documentation

## 2013-10-07 DIAGNOSIS — Z8719 Personal history of other diseases of the digestive system: Secondary | ICD-10-CM | POA: Insufficient documentation

## 2013-10-07 DIAGNOSIS — S0990XA Unspecified injury of head, initial encounter: Secondary | ICD-10-CM | POA: Insufficient documentation

## 2013-10-07 DIAGNOSIS — E1142 Type 2 diabetes mellitus with diabetic polyneuropathy: Secondary | ICD-10-CM | POA: Insufficient documentation

## 2013-10-07 DIAGNOSIS — S79929A Unspecified injury of unspecified thigh, initial encounter: Principal | ICD-10-CM

## 2013-10-07 DIAGNOSIS — R350 Frequency of micturition: Secondary | ICD-10-CM | POA: Insufficient documentation

## 2013-10-07 DIAGNOSIS — W06XXXA Fall from bed, initial encounter: Secondary | ICD-10-CM | POA: Insufficient documentation

## 2013-10-07 DIAGNOSIS — Y9389 Activity, other specified: Secondary | ICD-10-CM | POA: Insufficient documentation

## 2013-10-07 DIAGNOSIS — Z8639 Personal history of other endocrine, nutritional and metabolic disease: Secondary | ICD-10-CM | POA: Insufficient documentation

## 2013-10-07 DIAGNOSIS — R011 Cardiac murmur, unspecified: Secondary | ICD-10-CM | POA: Insufficient documentation

## 2013-10-07 DIAGNOSIS — F0390 Unspecified dementia without behavioral disturbance: Secondary | ICD-10-CM | POA: Insufficient documentation

## 2013-10-07 DIAGNOSIS — R451 Restlessness and agitation: Secondary | ICD-10-CM

## 2013-10-07 DIAGNOSIS — S0993XA Unspecified injury of face, initial encounter: Secondary | ICD-10-CM | POA: Insufficient documentation

## 2013-10-07 DIAGNOSIS — Z791 Long term (current) use of non-steroidal anti-inflammatories (NSAID): Secondary | ICD-10-CM | POA: Insufficient documentation

## 2013-10-07 DIAGNOSIS — E1149 Type 2 diabetes mellitus with other diabetic neurological complication: Secondary | ICD-10-CM | POA: Insufficient documentation

## 2013-10-07 DIAGNOSIS — M25552 Pain in left hip: Secondary | ICD-10-CM

## 2013-10-07 LAB — COMPREHENSIVE METABOLIC PANEL
ALBUMIN: 3.5 g/dL (ref 3.5–5.2)
ALK PHOS: 64 U/L (ref 39–117)
ALT: 7 U/L (ref 0–53)
AST: 10 U/L (ref 0–37)
BUN: 15 mg/dL (ref 6–23)
CHLORIDE: 102 meq/L (ref 96–112)
CO2: 29 mEq/L (ref 19–32)
Calcium: 9.3 mg/dL (ref 8.4–10.5)
Creatinine, Ser: 1.04 mg/dL (ref 0.50–1.35)
GFR calc Af Amer: 73 mL/min — ABNORMAL LOW (ref >90)
GFR calc non Af Amer: 63 mL/min — ABNORMAL LOW (ref >90)
Glucose, Bld: 130 mg/dL — ABNORMAL HIGH (ref 70–99)
POTASSIUM: 3.8 meq/L (ref 3.7–5.3)
Sodium: 140 mEq/L (ref 137–147)
Total Bilirubin: 1 mg/dL (ref 0.3–1.2)
Total Protein: 7.3 g/dL (ref 6.0–8.3)

## 2013-10-07 LAB — URINALYSIS, ROUTINE W REFLEX MICROSCOPIC
BILIRUBIN URINE: NEGATIVE
GLUCOSE, UA: NEGATIVE mg/dL
HGB URINE DIPSTICK: NEGATIVE
KETONES UR: NEGATIVE mg/dL
Leukocytes, UA: NEGATIVE
Nitrite: NEGATIVE
PROTEIN: 30 mg/dL — AB
Specific Gravity, Urine: 1.009 (ref 1.005–1.030)
Urobilinogen, UA: 0.2 mg/dL (ref 0.0–1.0)
pH: 7 (ref 5.0–8.0)

## 2013-10-07 LAB — CBC WITH DIFFERENTIAL/PLATELET
BASOS PCT: 0 % (ref 0–1)
Basophils Absolute: 0 10*3/uL (ref 0.0–0.1)
Eosinophils Absolute: 0 10*3/uL (ref 0.0–0.7)
Eosinophils Relative: 0 % (ref 0–5)
HCT: 33.4 % — ABNORMAL LOW (ref 39.0–52.0)
HEMOGLOBIN: 11.2 g/dL — AB (ref 13.0–17.0)
Lymphocytes Relative: 24 % (ref 12–46)
Lymphs Abs: 1.7 10*3/uL (ref 0.7–4.0)
MCH: 32.7 pg (ref 26.0–34.0)
MCHC: 33.5 g/dL (ref 30.0–36.0)
MCV: 97.7 fL (ref 78.0–100.0)
MONOS PCT: 11 % (ref 3–12)
Monocytes Absolute: 0.8 10*3/uL (ref 0.1–1.0)
NEUTROS ABS: 4.7 10*3/uL (ref 1.7–7.7)
NEUTROS PCT: 65 % (ref 43–77)
Platelets: 203 10*3/uL (ref 150–400)
RBC: 3.42 MIL/uL — AB (ref 4.22–5.81)
RDW: 15.2 % (ref 11.5–15.5)
WBC: 7.2 10*3/uL (ref 4.0–10.5)

## 2013-10-07 LAB — TYPE AND SCREEN
ABO/RH(D): O POS
ANTIBODY SCREEN: NEGATIVE

## 2013-10-07 LAB — URINE MICROSCOPIC-ADD ON

## 2013-10-07 LAB — PROTIME-INR
INR: 0.99 (ref 0.00–1.49)
Prothrombin Time: 12.9 seconds (ref 11.6–15.2)

## 2013-10-07 LAB — ABO/RH: ABO/RH(D): O POS

## 2013-10-07 MED ORDER — CLONAZEPAM 0.5 MG PO TABS
0.5000 mg | ORAL_TABLET | Freq: Once | ORAL | Status: DC
  Filled 2013-10-07: qty 1

## 2013-10-07 MED ORDER — ACETAMINOPHEN 325 MG PO TABS
650.0000 mg | ORAL_TABLET | Freq: Once | ORAL | Status: DC
  Filled 2013-10-07: qty 2

## 2013-10-07 MED ORDER — LORAZEPAM 2 MG/ML IJ SOLN
0.5000 mg | Freq: Once | INTRAMUSCULAR | Status: AC
  Administered 2013-10-07: 0.5 mg via INTRAVENOUS
  Filled 2013-10-07: qty 1

## 2013-10-07 MED ORDER — LOSARTAN POTASSIUM 50 MG PO TABS
50.0000 mg | ORAL_TABLET | Freq: Once | ORAL | Status: DC
  Filled 2013-10-07: qty 1

## 2013-10-07 NOTE — ED Notes (Signed)
Per EMS, pt from home.  Wife states pt slid out of bed. Pt landed on carpet.  No obvious injury yet pt had head against bed rail.  Unconfirmed for LOC, family could not verify.  Pt family also stated that pt has been urinating more frequently and more shaky than normal.  Pt normally in [facility] but is on leave at home.  Pt does have hx of dementia.  Family also concerned for care at home and needs possible readmit back to [facility].  Vitals:  220/104, hr 46 - normal for patient, resp 16, cbg 144, sats 99% ra.  No blood thinners noted.

## 2013-10-07 NOTE — ED Notes (Signed)
Pt is agitated, while attempting to administer PO medications pt threw pills on the floor.  Will ask PA for IV version of medications.

## 2013-10-07 NOTE — ED Notes (Signed)
Pt family requesting for anxiety meds.

## 2013-10-07 NOTE — ED Provider Notes (Signed)
CSN: [id number]     Arrival date & time 10/07/13  1553 History   First MD Initiated Contact with Patient 10/07/13 1555     Chief Complaint  Patient presents with  . Fall  . Hip Pain    HPI  [name] is a 80 y.o. male with a PMH of DM, carotid artery stenosis, gait disturbance, anxiety, GERD, depression, OA, HTN, aortic stenosis, hyperlipidemia, memory loss, dementia, BPH and dysrhythmia who presents to the ED for evaluation of fall and hip pain. History was provided by patient and his wife. Patient at home PTA was trying to get up to use the bathroom and fell. Fall was un witnessed. Patient uses a wheelchair and walker at home with assistance. Patient has a Actuary at bedside (home health), who stepped out for a moment when the patient fell. Patient was found laying on the floor with his head and neck twisted next to the bed. Wife believes he hit his head. Patient complains of headache and neck pain. Unsure about LOC. No blood thinners. Patient also complained of left hip pain. No numbness, weakness, or loss of sensation. Wife called EMS after fall. Patient has not attempted to ambulate since falling. No other injuries. Patient has not been doing well at home living with his wife. He has been "more shaky than usual" and has increased frequency of urination. Has refuses to take his medications at home. Patient is on leave from the [place] [facility] currently in the care of his wife.    Past Medical History  Diagnosis Date  . Carotid artery stenosis   . Gait disturbance   . Anxiety   . GERD (gastroesophageal reflux disease)   . Vitamin B12 deficiency   . Depression   . Osteoarthritis   . Diabetes mellitus, type 2   . Hypertension   . Vitamin D deficiency   . Tremor   . Aortic stenosis     murmur  . Hyperlipidemia   . Diverticulosis   . Adenomatous polyp of colon   . Memory loss   . Polyneuropathy in diabetes(357.2)   . Dementia   . Dysrhythmia   . BPH (benign prostatic  hyperplasia)    Past Surgical History  Procedure Laterality Date  . Vasectomy    . Lumbar laminectomy      L5 Dr [facility]in [facility]  . Transurethral resection of prostate  March 2007  . Cataract extraction, bilateral Bilateral   . Tonsillectomy and adenoidectomy    . Left foot      fracture of left foot   Family History  Problem Relation Age of Onset  . Diabetes Father   . Colon cancer Brother   . Heart disease Brother   . Diabetes Brother   . Parkinson's disease Sister   . Arthritis Sister   . Alzheimer's disease Sister    History  Substance Use Topics  . Smoking status: Former Smoker    Types: Cigars  . Smokeless tobacco: Never Used  . Alcohol Use: No    Review of Systems  Constitutional: Negative for fever, chills, diaphoresis, activity change, appetite change and fatigue.  HENT: Negative for facial swelling.   Eyes: Negative for visual disturbance.  Respiratory: Negative for cough and shortness of breath.   Cardiovascular: Negative for chest pain and leg swelling.  Gastrointestinal: Negative for nausea, vomiting, abdominal pain, diarrhea and constipation.  Genitourinary: Positive for frequency. Negative for dysuria, scrotal swelling, difficulty urinating and penile pain.  Musculoskeletal: Positive for arthralgias (left hip)  and neck pain. Negative for back pain and joint swelling.  Skin: Negative for wound.  Neurological: Positive for headaches. Negative for syncope, facial asymmetry, speech difficulty and weakness.  Psychiatric/Behavioral: Positive for confusion (dementia) and agitation.    Allergies  Citalopram hydrobromide and Duloxetine  Home Medications   Current Outpatient Rx  Name  Route  Sig  Dispense  Refill  . acetaminophen (TYLENOL) 325 MG tablet   Oral   Take 650 mg by mouth every 6 (six) hours as needed.         . ARIPiprazole (ABILIFY) 5 MG tablet   Oral   Take 1 tablet (5 mg total) by mouth at bedtime.   30 tablet   1   . clonazePAM (KLONOPIN) 0.5  MG tablet   Oral   Take 1 tablet (0.5 mg total) by mouth 3 (three) times daily.   90 tablet   1   . diclofenac sodium (VOLTAREN) 1 % GEL   Topical   Apply 2 g topically 2 (two) times daily as needed (pain left ankle and left hip). For pain         . donepezil (ARICEPT) 5 MG tablet   Oral   Take 1 tablet (5 mg total) by mouth at bedtime.   90 tablet   2   . furosemide (LASIX) 40 MG tablet   Oral   Take 40 mg by mouth daily.         Marland Kitchen losartan (COZAAR) 50 MG tablet   Oral   Take 1 tablet (50 mg total) by mouth daily.   30 tablet   5   . Morphine Sulfate (MORPHINE CONCENTRATE) 10 mg / 0.5 ml concentrated solution   Oral   Take 0.25 mLs (5 mg total) by mouth every 2 (two) hours as needed for moderate pain or shortness of breath.   30 mL   0   . ondansetron (ZOFRAN ODT) 4 MG disintegrating tablet   Oral   Take 1 tablet (4 mg total) by mouth every 8 (eight) hours as needed for nausea.   20 tablet   0   . potassium chloride (KLOR-CON 10) 10 MEQ tablet   Oral   Take 1 tablet (10 mEq total) by mouth 2 (two) times daily.   180 tablet   3   . senna (SENOKOT) 8.6 MG TABS tablet   Oral   Take 1-2 tablets by mouth daily as needed for mild constipation.         . sertraline (ZOLOFT) 50 MG tablet   Oral   Take 1 tablet (50 mg total) by mouth daily.   30 tablet   1   . zolpidem (AMBIEN) 10 MG tablet   Oral   Take 1-1.5 tablets (10-15 mg total) by mouth at bedtime.   90 tablet   1    BP 222/69  Pulse 44  Temp(Src) 97.8 F (36.6 C) (Oral)  Resp 15  SpO2 100%  Filed Vitals:   10/07/13 1615 10/07/13 1630 10/07/13 1817 10/07/13 2100  BP: 206/65 222/72 193/83 190/75  Pulse: 43 46 45 64  Temp:    98.4 F (36.9 C)  TempSrc:    Oral  Resp: 14 17 18 20   SpO2: 99% 98% 100% 100%    Physical Exam  Nursing note and vitals reviewed. Constitutional: He is oriented to person, place, and time. He appears well-developed and well-nourished. No distress.  HENT:   Head: Normocephalic and atraumatic.  Right Ear: External ear  normal.  Left Ear: External ear normal.  Nose: Nose normal.  Mouth/Throat: Oropharynx is clear and moist. No oropharyngeal exudate.  No tenderness to the scalp or face throughout. No palpable hematoma, step-offs, or lacerations throughout.  Tympanic membranes gray and translucent bilaterally.    Eyes: Conjunctivae are normal. Pupils are equal, round, and reactive to light. Right eye exhibits no discharge. Left eye exhibits no discharge.  Neck: Normal range of motion. Neck supple.  Diffuse tenderness to the posterior cervical spine throughout.   Cardiovascular: Normal rate, regular rhythm, normal heart sounds and intact distal pulses.  Exam reveals no gallop and no friction rub.   No murmur heard. Dorsalis pedis pulses present and equal bilaterally  Pulmonary/Chest: Effort normal and breath sounds normal. No respiratory distress. He has no wheezes. He has no rales. He exhibits no tenderness.  Abdominal: Soft. He exhibits no distension and no mass. There is no tenderness. There is no rebound and no guarding.  Musculoskeletal: Normal range of motion. He exhibits no edema and no tenderness.  No tenderness to palpation to the UE or LE throughout. ROM intact in the UE and LE throughout. No tenderness to palpation to the thoracic or lumbar spinous processes throughout. Pain increased in the left hip with internal and external ROM. No tenderness to palpation to the paraspinal muscles throughout.   Neurological: He is alert and oriented to person, place, and time.  GCS 15.  No focal neurological deficits.  CN 2-12 intact.  No pronator drift.    Skin: Skin is warm and dry. He is not diaphoretic.  No wounds, edema, erythema or ecchymosis throughout.      ED Course  Procedures (including critical care time) Labs Review Labs Reviewed - No data to display Imaging Review Dg Hip Complete Left  10/07/2013   CLINICAL DATA:  Fall.  EXAM: LEFT  HIP - COMPLETE 2+ VIEW  COMPARISON:  07/31/2013  FINDINGS: Mild to moderate bilateral hip osteoarthritis. Degenerative disc disease is identified within the lumbar spine. There is no evidence of hip fracture or dislocation.  IMPRESSION: 1. Chronic degenerative changes. 2. No acute findings.   Electronically Signed   By: Kerby Moors M.D.   On: 10/07/2013 17:20   Ct Head Wo Contrast  10/07/2013   CLINICAL DATA:  Fall out of bed.  Neck pain.  EXAM: CT HEAD WITHOUT CONTRAST  CT CERVICAL SPINE WITHOUT CONTRAST  TECHNIQUE: Multidetector CT imaging of the head and cervical spine was performed following the standard protocol without intravenous contrast. Multiplanar CT image reconstructions of the cervical spine were also generated.  COMPARISON:  07/31/2013  FINDINGS: CT HEAD FINDINGS  Moderate chronic periventricular white matter microvascular ischemic changes appear stable. There is stable cerebral volume loss. Ventricles are stable in size. No evidence of hydrocephalus. Negative for hemorrhage, mass effect, mass lesion, or evidence of acute cortically based infarction. Negative for scalp hematoma or subcutaneous gas. The skull is intact. The imaged paranasal sinuses and mastoid air cells are clear.  CT CERVICAL SPINE FINDINGS  There is approximately 2 mm retrolisthesis of C4 on C5. Remainder of the cervical spine levels are normally aligned. There is marked degenerative disc disease at C4-C5. Lesser degree of degenerative disc disease is seen at all levels of the cervical spine. Prevertebral soft tissue contour is within normal limits. No acute cervical spine fracture.  Atherosclerotic calcification of the carotid artery bulbs noted. Imaged lung apices are clear.  IMPRESSION: 1. No acute intracranial abnormality. 2. Chronic microvascular ischemic changes  and cerebral atrophy, stable. 3. Multilevel degenerative disc disease is a the cervical spine. 4. No evidence of acute bony due to the cervical spine.    Electronically Signed   By: Curlene Dolphin M.D.   On: 10/07/2013 17:35   Ct Cervical Spine Wo Contrast  10/07/2013   CLINICAL DATA:  Fall out of bed.  Neck pain.  EXAM: CT HEAD WITHOUT CONTRAST  CT CERVICAL SPINE WITHOUT CONTRAST  TECHNIQUE: Multidetector CT imaging of the head and cervical spine was performed following the standard protocol without intravenous contrast. Multiplanar CT image reconstructions of the cervical spine were also generated.  COMPARISON:  07/31/2013  FINDINGS: CT HEAD FINDINGS  Moderate chronic periventricular white matter microvascular ischemic changes appear stable. There is stable cerebral volume loss. Ventricles are stable in size. No evidence of hydrocephalus. Negative for hemorrhage, mass effect, mass lesion, or evidence of acute cortically based infarction. Negative for scalp hematoma or subcutaneous gas. The skull is intact. The imaged paranasal sinuses and mastoid air cells are clear.  CT CERVICAL SPINE FINDINGS  There is approximately 2 mm retrolisthesis of C4 on C5. Remainder of the cervical spine levels are normally aligned. There is marked degenerative disc disease at C4-C5. Lesser degree of degenerative disc disease is seen at all levels of the cervical spine. Prevertebral soft tissue contour is within normal limits. No acute cervical spine fracture.  Atherosclerotic calcification of the carotid artery bulbs noted. Imaged lung apices are clear.  IMPRESSION: 1. No acute intracranial abnormality. 2. Chronic microvascular ischemic changes and cerebral atrophy, stable. 3. Multilevel degenerative disc disease is a the cervical spine. 4. No evidence of acute bony due to the cervical spine.   Electronically Signed   By: Curlene Dolphin M.D.   On: 10/07/2013 17:35     EKG Interpretation   Date/Time:  Saturday October 07 2013 15:59:31 EDT Ventricular Rate:  43 PR Interval:  211 QRS Duration: 76 QT Interval:  499 QTC Calculation: 422 R Axis:   57 Text Interpretation:  Sinus  bradycardia Borderline ST depression, diffuse  leads atrial fibrillation not present on current ECG when compared to  previous tracing Confirmed by KNAPP  MD-J, JON ([id number]) on 10/07/2013  5:19:53 PM      Results for orders placed during the hospital encounter of 10/07/13  CBC WITH DIFFERENTIAL      Result Value Ref Range   WBC 7.2  4.0 - 10.5 K/uL   RBC 3.42 (*) 4.22 - 5.81 MIL/uL   Hemoglobin 11.2 (*) 13.0 - 17.0 g/dL   HCT 33.4 (*) 39.0 - 52.0 %   MCV 97.7  78.0 - 100.0 fL   MCH 32.7  26.0 - 34.0 pg   MCHC 33.5  30.0 - 36.0 g/dL   RDW 15.2  11.5 - 15.5 %   Platelets 203  150 - 400 K/uL   Neutrophils Relative % 65  43 - 77 %   Neutro Abs 4.7  1.7 - 7.7 K/uL   Lymphocytes Relative 24  12 - 46 %   Lymphs Abs 1.7  0.7 - 4.0 K/uL   Monocytes Relative 11  3 - 12 %   Monocytes Absolute 0.8  0.1 - 1.0 K/uL   Eosinophils Relative 0  0 - 5 %   Eosinophils Absolute 0.0  0.0 - 0.7 K/uL   Basophils Relative 0  0 - 1 %   Basophils Absolute 0.0  0.0 - 0.1 K/uL  COMPREHENSIVE METABOLIC PANEL      Result  Value Ref Range   Sodium 140  137 - 147 mEq/L   Potassium 3.8  3.7 - 5.3 mEq/L   Chloride 102  96 - 112 mEq/L   CO2 29  19 - 32 mEq/L   Glucose, Bld 130 (*) 70 - 99 mg/dL   BUN 15  6 - 23 mg/dL   Creatinine, Ser 7.85  0.50 - 1.35 mg/dL   Calcium 9.3  8.4 - 88.5 mg/dL   Total Protein 7.3  6.0 - 8.3 g/dL   Albumin 3.5  3.5 - 5.2 g/dL   AST 10  0 - 37 U/L   ALT 7  0 - 53 U/L   Alkaline Phosphatase 64  39 - 117 U/L   Total Bilirubin 1.0  0.3 - 1.2 mg/dL   GFR calc non Af Amer 63 (*) >90 mL/min   GFR calc Af Amer 73 (*) >90 mL/min  URINALYSIS, ROUTINE W REFLEX MICROSCOPIC      Result Value Ref Range   Color, Urine YELLOW  YELLOW   APPearance CLEAR  CLEAR   Specific Gravity, Urine 1.009  1.005 - 1.030   pH 7.0  5.0 - 8.0   Glucose, UA NEGATIVE  NEGATIVE mg/dL   Hgb urine dipstick NEGATIVE  NEGATIVE   Bilirubin Urine NEGATIVE  NEGATIVE   Ketones, ur NEGATIVE  NEGATIVE mg/dL    Protein, ur 30 (*) NEGATIVE mg/dL   Urobilinogen, UA 0.2  0.0 - 1.0 mg/dL   Nitrite NEGATIVE  NEGATIVE   Leukocytes, UA NEGATIVE  NEGATIVE  PROTIME-INR      Result Value Ref Range   Prothrombin Time 12.9  11.6 - 15.2 seconds   INR 0.99  0.00 - 1.49  URINE MICROSCOPIC-ADD ON      Result Value Ref Range   Squamous Epithelial / LPF RARE  RARE   WBC, UA 0-2  <3 WBC/hpf  TYPE AND SCREEN      Result Value Ref Range   ABO/RH(D) O POS     Antibody Screen NEG     Sample Expiration 10/10/2013    ABO/RH      Result Value Ref Range   ABO/RH(D) O POS    '  [name]   Hinton Rao is a 80 y.o. male with a PMH of DM, carotid artery stenosis, gait disturbance, anxiety, GERD, depression, OA, HTN, aortic stenosis, hyperlipidemia, memory loss, dementia, BPH and dysrhythmia who presents to the ED for evaluation of fall and hip pain.   Rechecks  7:30 PM = Patient becoming agitated. Home Klonopin ordered.  7:45 PM = Patient hit medication cup out of RN's hands. Ordering Ativan IV for agitation.    Patient evaluated for a mechanical fall today at home. Head CT negative for an acute intracranial process. No neurological deficits on exam. Neck CT negative. Pain possibly due to sprain/sprain vs DDD. Left hip x-rays negative, however, also showed degenerative changes. Patient neurovascularly intact. Patient unsteady and requires assistance with ambulation at baseline. Patient became agitated throughout ED visit likely due to dementia. Patient given Ativan with improvements. He was found to have HTN likely due to medication non-compliance. Home medications given in the ED. EKG negative for any acute ischemic changes. Patient discharged back to [facility]. Wife in ED states she cannot care for him at home by herself right now. Patient has place there and was home on leave when the fall occurred. Patient's PCP aware of his decline at home and need for 24/7 care. Will follow-up with  PCP. Return precautions,  discharge instructions, and follow-up was discussed with the patient and wife before discharge.      Discharge Medication List as of 10/07/2013  8:22 PM      Final impressions: 1. Fall   2. Left hip pain   3. Hypertension   4. Head injury   5. Neck pain   6. Agitation       Mercy Moore PA-C   This patient was discussed with Dr. Shirlee Latch, PA-C 10/08/13 1051

## 2013-10-07 NOTE — Discharge Instructions (Signed)
Continue scheduled medications Ambulate with assistance  Return to the emergency department if you develop any changing/worsening condition or any other concerns (please read additional information regarding your condition below)    Fall Prevention and Home Safety Falls cause injuries and can affect all age groups. It is possible to use preventive measures to significantly decrease the likelihood of falls. There are many simple measures which can make your home safer and prevent falls. OUTDOORS  Repair cracks and edges of walkways and driveways.  Remove high doorway thresholds.  Trim shrubbery on the main path into your home.  Have good outside lighting.  Clear walkways of tools, rocks, debris, and clutter.  Check that handrails are not broken and are securely fastened. Both sides of steps should have handrails.  Have leaves, snow, and ice cleared regularly.  Use sand or salt on walkways during winter months.  In the garage, clean up grease or oil spills. BATHROOM  Install night lights.  Install grab bars by the toilet and in the tub and shower.  Use non-skid mats or decals in the tub or shower.  Place a plastic non-slip stool in the shower to sit on, if needed.  Keep floors dry and clean up all water on the floor immediately.  Remove soap buildup in the tub or shower on a regular basis.  Secure bath mats with non-slip, double-sided rug tape.  Remove throw rugs and tripping hazards from the floors. BEDROOMS  Install night lights.  Make sure a bedside light is easy to reach.  Do not use oversized bedding.  Keep a telephone by your bedside.  Have a firm chair with side arms to use for getting dressed.  Remove throw rugs and tripping hazards from the floor. KITCHEN  Keep handles on pots and pans turned toward the center of the stove. Use back burners when possible.  Clean up spills quickly and allow time for drying.  Avoid walking on wet floors.  Avoid  hot utensils and knives.  Position shelves so they are not too high or low.  Place commonly used objects within easy reach.  If necessary, use a sturdy step stool with a grab bar when reaching.  Keep electrical cables out of the way.  Do not use floor polish or wax that makes floors slippery. If you must use wax, use non-skid floor wax.  Remove throw rugs and tripping hazards from the floor. STAIRWAYS  Never leave objects on stairs.  Place handrails on both sides of stairways and use them. Fix any loose handrails. Make sure handrails on both sides of the stairways are as long as the stairs.  Check carpeting to make sure it is firmly attached along stairs. Make repairs to worn or loose carpet promptly.  Avoid placing throw rugs at the top or bottom of stairways, or properly secure the rug with carpet tape to prevent slippage. Get rid of throw rugs, if possible.  Have an electrician put in a light switch at the top and bottom of the stairs. OTHER FALL PREVENTION TIPS  Wear low-heel or rubber-soled shoes that are supportive and fit well. Wear closed toe shoes.  When using a stepladder, make sure it is fully opened and both spreaders are firmly locked. Do not climb a closed stepladder.  Add color or contrast paint or tape to grab bars and handrails in your home. Place contrasting color strips on first and last steps.  Learn and use mobility aids as needed. Install an electrical emergency response system.  Turn on lights to avoid dark areas. Replace light bulbs that burn out immediately. Get light switches that glow.  Arrange furniture to create clear pathways. Keep furniture in the same place.  Firmly attach carpet with non-skid or double-sided tape.  Eliminate uneven floor surfaces.  Select a carpet pattern that does not visually hide the edge of steps.  Be aware of all pets. OTHER HOME SAFETY TIPS  Set the water temperature for 120 F (48.8 C).  Keep emergency numbers  on or near the telephone.  Keep smoke detectors on every level of the home and near sleeping areas. Document Released: 06/05/2002 Document Revised: 12/15/2011 Document Reviewed: 09/04/2011 [employer] Patient Information 2014 [place].  Hip Pain The hips join the upper legs to the lower pelvis. The bones, cartilage, tendons, and muscles of the hip joint perform a lot of work each day holding your body weight and allowing you to move around. Hip pain is a common symptom. It can range from a minor ache to severe pain on 1 or both hips. Pain may be felt on the inside of the hip joint near the groin, or the outside near the buttocks and upper thigh. There may be swelling or stiffness as well. It occurs more often when a person walks or performs activity. There are many reasons hip pain can develop. CAUSES  It is important to work with your caregiver to identify the cause since many conditions can impact the bones, cartilage, muscles, and tendons of the hips. Causes for hip pain include: Broken (fractured) bones. Separation of the thighbone from the hip socket (dislocation). Torn cartilage of the hip joint. Swelling (inflammation) of a tendon (tendonitis), the sac within the hip joint (bursitis), or a joint. A weakening in the abdominal wall (hernia), affecting the nerves to the hip. Arthritis in the hip joint or lining of the hip joint. Pinched nerves in the back, hip, or upper thigh. A bulging disc in the spine (herniated disc). Rarely, bone infection or cancer. DIAGNOSIS  The location of your hip pain will help your caregiver understand what may be causing the pain. A diagnosis is based on your medical history, your symptoms, results from your physical exam, and results from diagnostic tests. Diagnostic tests may include X-ray exams, a computerized magnetic scan (magnetic resonance imaging, MRI), or bone scan. TREATMENT  Treatment will depend on the cause of your hip pain. Treatment may  include: Limiting activities and resting until symptoms improve. Crutches or other walking supports (a cane or brace). Ice, elevation, and compression. Physical therapy or home exercises. Shoe inserts or special shoes. Losing weight. Medications to reduce pain. Undergoing surgery. HOME CARE INSTRUCTIONS  Only take over-the-counter or prescription medicines for pain, discomfort, or fever as directed by your caregiver. Put ice on the injured area: Put ice in a plastic bag. Place a towel between your skin and the bag. Leave the ice on for 15-20 minutes at a time, 03-04 times a day. Keep your leg raised (elevated) when possible to lessen swelling. Avoid activities that cause pain. Follow specific exercises as directed by your caregiver. Sleep with a pillow between your legs on your most comfortable side. Record how often you have hip pain, the location of the pain, and what it feels like. This information may be helpful to you and your caregiver. Ask your caregiver about returning to work or sports and whether you should drive. Follow up with your caregiver for further exams, therapy, or testing as directed. [place]  IF:  Your pain or swelling continues or worsens after 1 week. You are feeling unwell or have chills. You have increasing difficulty with walking. You have a loss of sensation or other new symptoms. You have questions or concerns. SEEK IMMEDIATE MEDICAL CARE IF:  You cannot put weight on the affected hip. You have fallen. You have a sudden increase in pain and swelling in your hip. You have a fever. MAKE SURE YOU:  Understand these instructions. Will watch your condition. Will get help right away if you are not doing well or get worse. Document Released: 12/03/2009 Document Revised: 09/07/2011 Document Reviewed: 12/03/2009 [facility] Patient Information 2014 [place].  Hypertension As your heart beats, it forces blood through your arteries. This force is  your blood pressure. If the pressure is too high, it is called hypertension (HTN) or high blood pressure. HTN is dangerous because you may have it and not know it. High blood pressure may mean that your heart has to work harder to pump blood. Your arteries may be narrow or stiff. The extra work puts you at risk for heart disease, stroke, and other problems.  Blood pressure consists of two numbers, a higher number over a lower, 110/72, for example. It is stated as "110 over 72." The ideal is below 120 for the top number (systolic) and under 80 for the bottom (diastolic). Write down your blood pressure today. You should pay close attention to your blood pressure if you have certain conditions such as:  Heart failure.  Prior heart attack.  Diabetes  Chronic kidney disease.  Prior stroke.  Multiple risk factors for heart disease. To see if you have HTN, your blood pressure should be measured while you are seated with your arm held at the level of the heart. It should be measured at least twice. A one-time elevated blood pressure reading (especially in the Emergency Department) does not mean that you need treatment. There may be conditions in which the blood pressure is different between your right and left arms. It is important to see your caregiver soon for a recheck. Most people have essential hypertension which means that there is not a specific cause. This type of high blood pressure may be lowered by changing lifestyle factors such as:  Stress.  Smoking.  Lack of exercise.  Excessive weight.  Drug/tobacco/alcohol use.  Eating less salt. Most people do not have symptoms from high blood pressure until it has caused damage to the body. Effective treatment can often prevent, delay or reduce that damage. TREATMENT  When a cause has been identified, treatment for high blood pressure is directed at the cause. There are a large number of medications to treat HTN. These fall into several  categories, and your caregiver will help you select the medicines that are best for you. Medications may have side effects. You should review side effects with your caregiver. If your blood pressure stays high after you have made lifestyle changes or started on medicines,   Your medication(s) may need to be changed.  Other problems may need to be addressed.  Be certain you understand your prescriptions, and know how and when to take your medicine.  Be sure to follow up with your caregiver within the time frame advised (usually within two weeks) to have your blood pressure rechecked and to review your medications.  If you are taking more than one medicine to lower your blood pressure, make sure you know how and at what times they should be taken.  Taking two medicines at the same time can result in blood pressure that is too low. SEEK IMMEDIATE MEDICAL CARE IF:  You develop a severe headache, blurred or changing vision, or confusion.  You have unusual weakness or numbness, or a faint feeling.  You have severe chest or abdominal pain, vomiting, or breathing problems. MAKE SURE YOU:   Understand these instructions.  Will watch your condition.  Will get help right away if you are not doing well or get worse. Document Released: 06/15/2005 Document Revised: 09/07/2011 Document Reviewed: 02/03/2008 [facility] Patient Information 2014 [place].  Head Injury, Adult You have received a head injury. It does not appear serious at this time. Headaches and vomiting are common following head injury. It should be easy to awaken from sleeping. Sometimes it is necessary for you to stay in the emergency department for a while for observation. Sometimes admission to the hospital may be needed. After injuries such as yours, most problems occur within the first 24 hours, but side effects may occur up to 7 10 days after the injury. It is important for you to carefully monitor your condition and contact  your health care provider or seek immediate medical care if there is a change in your condition. WHAT ARE THE TYPES OF HEAD INJURIES? Head injuries can be as minor as a bump. Some head injuries can be more severe. More severe head injuries include:  A jarring injury to the brain (concussion).  A bruise of the brain (contusion). This mean there is bleeding in the brain that can cause swelling.  A cracked skull (skull fracture).  Bleeding in the brain that collects, clots, and forms a bump (hematoma). WHAT CAUSES A HEAD INJURY? A serious head injury is most likely to happen to someone who is in a car wreck and is not wearing a seat belt. Other causes of major head injuries include bicycle or motorcycle accidents, sports injuries, and falls. HOW ARE HEAD INJURIES DIAGNOSED? A complete history of the event leading to the injury and your current symptoms will be helpful in diagnosing head injuries. Many times, pictures of the brain, such as CT or MRI are needed to see the extent of the injury. Often, an overnight hospital stay is necessary for observation.  WHEN SHOULD I SEEK IMMEDIATE MEDICAL CARE?  You should get help right away if:  You have confusion or drowsiness.  You feel sick to your stomach (nauseous) or have continued, forceful vomiting.  You have dizziness or unsteadiness that is getting worse.  You have severe, continued headaches not relieved by medicine. Only take over-the-counter or prescription medicines for pain, fever, or discomfort as directed by your health care provider.  You do not have normal function of the arms or legs or are unable to walk.  You notice changes in the black spots in the center of the colored part of your eye (pupil).  You have a clear or bloody fluid coming from your nose or ears.  You have a loss of vision. During the next 24 hours after the injury, you must stay with someone who can watch you for the warning signs. This person should contact  local emergency services (911 in the [place] if you have seizures, you become unconscious, or you are unable to wake up. HOW CAN I PREVENT A HEAD INJURY IN THE FUTURE? The most important factor for preventing major head injuries is avoiding motor vehicle accidents. To minimize the potential for damage to your head, it is crucial  to wear seat belts while riding in motor vehicles. Wearing helmets while bike riding and playing collision sports (like football) is also helpful. Also, avoiding dangerous activities around the house will further help reduce your risk of head injury.  WHEN CAN I RETURN TO NORMAL ACTIVITIES AND ATHLETICS? You should be reevaluated by your health care provider before returning to these activities. If you have any of the following symptoms, you should not return to activities or contact sports until 1 week after the symptoms have stopped:  Persistent headache.  Dizziness or vertigo.  Poor attention and concentration.  Confusion.  Memory problems.  Nausea or vomiting.  Fatigue or tire easily.  Irritability.  Intolerant of bright lights or loud noises.  Anxiety or depression.  Disturbed sleep. MAKE SURE YOU:   Understand these instructions.  Will watch your condition.  Will get help right away if you are not doing well or get worse. Document Released: 06/15/2005 Document Revised: 04/05/2013 Document Reviewed: 02/20/2013 [facility] Patient Information 2014 [place].

## 2013-10-07 NOTE — ED Notes (Signed)
Bed: WA03 Expected date: 10/07/13 Expected time: 3:37 PM Means of arrival: Ambulance Comments: Fall, Dementia

## 2013-10-07 NOTE — ED Notes (Signed)
[facility] called for transport back to [facility].  Report called to Mardene Celeste at [facility].

## 2013-10-07 NOTE — ED Notes (Signed)
PA at bedside.

## 2013-10-08 NOTE — ED Provider Notes (Signed)
Medical screening examination/treatment/procedure(s) were conducted as a shared visit with non-physician practitioner(s) and myself.  I personally evaluated the patient during the encounter.   EKG Interpretation   Date/Time:  Saturday October 07 2013 15:59:31 EDT Ventricular Rate:  43 PR Interval:  211 QRS Duration: 76 QT Interval:  499 QTC Calculation: 422 R Axis:   57 Text Interpretation:  Sinus bradycardia Borderline ST depression, diffuse  leads atrial fibrillation not present on current ECG when compared to  previous tracing Confirmed by Deisha Stull  MD-J, Pasqualino Witherspoon ([id number]) on 10/07/2013  5:19:53 PM      Pt presented to ED after a fall.  No acute fractures or significant injuries found.  Able to bear weight  Pt had been in a nursing facility.  Wife had taken him back home on leave.  Pt transported back to nursing facility.  Kathalene Frames, MD 10/08/13 [phone]

## 2013-10-11 NOTE — Telephone Encounter (Signed)
Pt's wife called to see if Dr. Jannifer Franklin has completed the letter for her to keep pt at home with 24 hour home health care. Please call pt's wife to call back to advise. Thanks

## 2013-10-15 ENCOUNTER — Encounter: Payer: Self-pay | Admitting: Internal Medicine

## 2013-10-16 ENCOUNTER — Telehealth: Payer: Self-pay

## 2013-10-16 NOTE — Telephone Encounter (Signed)
OK pelvis X ray Thx

## 2013-10-16 NOTE — Telephone Encounter (Signed)
Alma from [facility] called and is hoping to get an order for a xray for the pt's back due to him having a fall on 10/07/13   Callback for [facility]- [phone]

## 2013-10-17 NOTE — Telephone Encounter (Signed)
Called [facility]- no anser/will try again later.

## 2013-10-17 NOTE — Telephone Encounter (Signed)
Written order faxed.

## 2013-10-17 NOTE — Telephone Encounter (Signed)
Sharyn Lull from [facility] called requesting xray order be faxed to [phone].

## 2013-10-20 ENCOUNTER — Encounter: Payer: Self-pay | Admitting: Internal Medicine

## 2013-10-21 ENCOUNTER — Other Ambulatory Visit: Payer: Self-pay | Admitting: Internal Medicine

## 2013-10-21 NOTE — Telephone Encounter (Signed)
[name], please print Morphine Rx and order LS spine X ray. Dx LBP Thx

## 2013-10-23 ENCOUNTER — Other Ambulatory Visit: Payer: Self-pay | Admitting: Internal Medicine

## 2013-10-23 MED ORDER — MORPHINE SULFATE 15 MG PO TABS: 15.0000 mg | ORAL_TABLET | Freq: Two times a day (BID) | ORAL | Status: DC | PRN

## 2013-10-23 NOTE — Telephone Encounter (Signed)
MS contin Rx was printed Pt needs a LS Xray Dx LBP Thx

## 2013-11-09 ENCOUNTER — Encounter: Payer: Self-pay | Admitting: Internal Medicine

## 2013-11-12 ENCOUNTER — Encounter: Payer: Self-pay | Admitting: Internal Medicine

## 2013-11-16 ENCOUNTER — Other Ambulatory Visit: Payer: Self-pay | Admitting: *Deleted

## 2013-11-16 NOTE — Telephone Encounter (Signed)
Caller left vm stating pt's wife requesting to change his Abilify to a cheaper alternative.   Also, she states the patient is very restless at night. They want to know if MD will change Ambien to [facility] or another sutiable med. Please advise.

## 2013-11-17 NOTE — Telephone Encounter (Signed)
1. OK. Pls check w/pharmacy what is less expensive than Abilify. Is Seroquel less expensive? 2. D/c Ambien. Use Clonazepam 0.5 mg at breakfast and 0.5 mg at lunch; use 1mg  or 1.5 mg at bedtime. Thx

## 2013-11-21 MED ORDER — CLONAZEPAM 0.5 MG PO TABS: ORAL_TABLET | ORAL | Status: DC

## 2013-11-21 NOTE — Telephone Encounter (Signed)
Informed Michelle at [facility] of below. She states the sig for Clonazepam must be very specific. It can not read 1 or 1.5 mg.  She is checking to see if Seroquel is cheaper than Abilify.   Also, they have faxed over a recent UA for pt. Please review and advise.

## 2013-11-21 NOTE — Telephone Encounter (Signed)
Ok. See new Rx pls Thx

## 2013-11-22 MED ORDER — CLONAZEPAM 0.5 MG PO TABS: ORAL_TABLET | ORAL | Status: DC

## 2013-11-22 NOTE — Telephone Encounter (Signed)
Updated rx faxed to [facility].

## 2013-12-19 ENCOUNTER — Encounter: Payer: Self-pay | Admitting: Internal Medicine

## 2013-12-19 ENCOUNTER — Ambulatory Visit (INDEPENDENT_AMBULATORY_CARE_PROVIDER_SITE_OTHER): Payer: Medicare Other | Admitting: Internal Medicine

## 2013-12-19 VITALS — BP 150/68 | HR 68 | Temp 97.9°F | Resp 16 | Wt 130.0 lb

## 2013-12-19 DIAGNOSIS — F411 Generalized anxiety disorder: Secondary | ICD-10-CM

## 2013-12-19 DIAGNOSIS — E119 Type 2 diabetes mellitus without complications: Secondary | ICD-10-CM

## 2013-12-19 DIAGNOSIS — E118 Type 2 diabetes mellitus with unspecified complications: Secondary | ICD-10-CM

## 2013-12-19 DIAGNOSIS — F03918 Unspecified dementia, unspecified severity, with other behavioral disturbance: Secondary | ICD-10-CM

## 2013-12-19 DIAGNOSIS — W19XXXS Unspecified fall, sequela: Secondary | ICD-10-CM

## 2013-12-19 DIAGNOSIS — G4700 Insomnia, unspecified: Secondary | ICD-10-CM

## 2013-12-19 DIAGNOSIS — F0391 Unspecified dementia with behavioral disturbance: Secondary | ICD-10-CM

## 2013-12-19 DIAGNOSIS — T7589XS Other specified effects of external causes, sequela: Secondary | ICD-10-CM

## 2013-12-19 DIAGNOSIS — Y92009 Unspecified place in unspecified non-institutional (private) residence as the place of occurrence of the external cause: Secondary | ICD-10-CM

## 2013-12-19 DIAGNOSIS — E538 Deficiency of other specified B group vitamins: Secondary | ICD-10-CM

## 2013-12-19 LAB — GLUCOSE, POCT (MANUAL RESULT ENTRY): POC Glucose: 195 mg/dl — AB (ref 70–99)

## 2013-12-19 MED ORDER — CLONAZEPAM 0.5 MG PO TABS: ORAL_TABLET | ORAL | Status: AC

## 2013-12-19 MED ORDER — CYANOCOBALAMIN 1000 MCG/ML IJ SOLN
1000.0000 ug | Freq: Once | INTRAMUSCULAR | Status: AC
  Administered 2013-12-19: 1000 ug via INTRAMUSCULAR

## 2013-12-19 MED ORDER — SERTRALINE HCL 50 MG PO TABS: 50.0000 mg | ORAL_TABLET | Freq: Two times a day (BID) | ORAL | Status: AC

## 2013-12-19 MED ORDER — DONEPEZIL HCL 5 MG PO TABS: 5.0000 mg | ORAL_TABLET | Freq: Every day | ORAL | Status: DC

## 2013-12-19 NOTE — Assessment & Plan Note (Signed)
Continue with current prescription therapy as reflected on the Med list.  

## 2013-12-19 NOTE — Progress Notes (Signed)
Pre visit review using our clinic review tool, if applicable. No additional management support is needed unless otherwise documented below in the visit note. 

## 2013-12-19 NOTE — Assessment & Plan Note (Signed)
Last fall 3 wks ago

## 2013-12-19 NOTE — Assessment & Plan Note (Signed)
Off Ambien per Hospice On Seroquel/clonopine

## 2013-12-19 NOTE — Assessment & Plan Note (Signed)
Off Ambien per Hospice On Seroquel/klonopine

## 2013-12-19 NOTE — Assessment & Plan Note (Addendum)
Off Ambien/Aricept per Hospice On Seroquel/klonopin

## 2013-12-19 NOTE — Progress Notes (Signed)
Patient ID: [name], male   DOB: Sep 22, 1924, 80 y.o.   MRN: [id number] Patient ID: [name], male   DOB: [birth year], 80 y.o.   MRN: [id number]   Subjective:    HPI  C/o agitation, insomnia and confusion at times    No edema and no wt gain   The patient presents for a follow-up of  chronic hypertension, chronic dyslipidemia, type 2 diabetes controlled with medicines, paranoid disorder, dementia  [name] is living at [facility]. Hospice is involved.  BP Readings from Last 3 Encounters:  12/19/13 150/68  10/07/13 190/75  09/25/13 171/71   Wt Readings from Last 3 Encounters:  12/19/13 130 lb (58.968 kg)  09/19/13 136 lb (61.689 kg)  08/01/13 134 lb (60.782 kg)      Review of Systems  Constitutional: Positive for unexpected weight change. Negative for appetite change and fatigue.  HENT: Negative for congestion, nosebleeds, sneezing, sore throat and trouble swallowing.   Eyes: Negative for itching and visual disturbance.  Respiratory: Negative for cough.   Cardiovascular: Positive for leg swelling. Negative for chest pain and palpitations.  Gastrointestinal: Negative for nausea, diarrhea, blood in stool and abdominal distention.  Genitourinary: Negative for frequency and hematuria.  Musculoskeletal: Negative for back pain, gait problem, joint swelling and neck pain.  Skin: Negative for rash.  Neurological: Positive for weakness. Negative for dizziness, tremors, speech difficulty and light-headedness.  Psychiatric/Behavioral: Positive for behavioral problems, confusion, sleep disturbance, self-injury, dysphoric mood and decreased concentration. Negative for suicidal ideas and agitation. The patient is nervous/anxious.        Objective:   Physical Exam  Constitutional: He is oriented to person, place, and time. He appears well-developed. No distress.  NAD In a w/c  HENT:  Mouth/Throat: Oropharynx is clear and moist.  Eyes: Conjunctivae are normal. Pupils  are equal, round, and reactive to light.  Neck: Normal range of motion. No JVD present. No thyromegaly present.  Cardiovascular: Normal rate, regular rhythm, normal heart sounds and intact distal pulses.  Exam reveals no gallop and no friction rub.   No murmur heard. Pulmonary/Chest: Effort normal and breath sounds normal. No respiratory distress. He has no wheezes. He has no rales. He exhibits no tenderness.  Abdominal: Soft. Bowel sounds are normal. He exhibits no distension and no mass. There is no tenderness. There is no rebound and no guarding.  Musculoskeletal: Normal range of motion. He exhibits edema (1+ B). He exhibits no tenderness.  Lymphadenopathy:    He has no cervical adenopathy.  Neurological: He is alert and oriented to person, place, and time. He has normal reflexes. No cranial nerve deficit. He exhibits normal muscle tone. He displays a negative Romberg sign. Coordination abnormal. Gait normal.  No meningeal signs  Skin: Skin is warm and dry. No rash noted. No erythema.  Psychiatric: Judgment normal. His affect is blunt. His affect is not angry. He is slowed. Thought content is paranoid and delusional. Cognition and memory are impaired.  skin breakdowns Confused LS is tender Can't stand, weak - he is in a w/c  Lab Results  Component Value Date   WBC 7.2 10/07/2013   HGB 11.2* 10/07/2013   HCT 33.4* 10/07/2013   PLT 203 10/07/2013   GLUCOSE 130* 10/07/2013   CHOL 174 03/09/2013   TRIG 69.0 03/09/2013   HDL 41.00 03/09/2013   LDLDIRECT 141.6 07/11/2007   LDLCALC 119* 03/09/2013   ALT 7 10/07/2013   AST 10 10/07/2013   NA 140 10/07/2013  K 3.8 10/07/2013   CL 102 10/07/2013   CREATININE 1.04 10/07/2013   BUN 15 10/07/2013   CO2 29 10/07/2013   TSH 0.65 07/04/2013   PSA 1.29 03/09/2013   INR 0.99 10/07/2013   HGBA1C 7.0* 09/19/2013    A complex case      Assessment & Plan:

## 2014-01-04 ENCOUNTER — Telehealth: Payer: Self-pay | Admitting: Internal Medicine

## 2014-01-04 NOTE — Telephone Encounter (Signed)
Alma from [facility] is calling in regards to the patient who hit his hand on his bedside table on 01/02/14 and she says that his hand is swollen, he is still in pain, and his pinky finger is bruised. They want to know if Dr. Alain Marion wants an x-ray done of his hand. Alma also says that he has been urinating more frequently and his urine has an odor. She wants to know if Dr. Alain Marion wants to order a UA. Please advise.

## 2014-01-04 NOTE — Telephone Encounter (Signed)
OK UA and hand xray  Thx!

## 2014-01-05 NOTE — Telephone Encounter (Signed)
Order faxed to [phone]

## 2014-01-18 ENCOUNTER — Encounter: Payer: Self-pay | Admitting: Internal Medicine

## 2014-01-18 ENCOUNTER — Emergency Department (HOSPITAL_COMMUNITY)

## 2014-01-18 ENCOUNTER — Inpatient Hospital Stay (HOSPITAL_COMMUNITY)
Admission: EM | Admit: 2014-01-18 | Discharge: 2014-01-25 | DRG: 871 | Disposition: A | Discharge status: Hospice | Attending: Internal Medicine | Admitting: Internal Medicine

## 2014-01-18 ENCOUNTER — Encounter (HOSPITAL_COMMUNITY): Payer: Self-pay | Admitting: Emergency Medicine

## 2014-01-18 DIAGNOSIS — M1990 Unspecified osteoarthritis, unspecified site: Secondary | ICD-10-CM | POA: Diagnosis present

## 2014-01-18 DIAGNOSIS — E870 Hyperosmolality and hypernatremia: Secondary | ICD-10-CM

## 2014-01-18 DIAGNOSIS — Z833 Family history of diabetes mellitus: Secondary | ICD-10-CM

## 2014-01-18 DIAGNOSIS — F411 Generalized anxiety disorder: Secondary | ICD-10-CM

## 2014-01-18 DIAGNOSIS — I482 Chronic atrial fibrillation, unspecified: Secondary | ICD-10-CM

## 2014-01-18 DIAGNOSIS — Z8249 Family history of ischemic heart disease and other diseases of the circulatory system: Secondary | ICD-10-CM

## 2014-01-18 DIAGNOSIS — E1142 Type 2 diabetes mellitus with diabetic polyneuropathy: Secondary | ICD-10-CM | POA: Diagnosis present

## 2014-01-18 DIAGNOSIS — E785 Hyperlipidemia, unspecified: Secondary | ICD-10-CM | POA: Diagnosis present

## 2014-01-18 DIAGNOSIS — R627 Adult failure to thrive: Secondary | ICD-10-CM | POA: Diagnosis present

## 2014-01-18 DIAGNOSIS — Z87891 Personal history of nicotine dependence: Secondary | ICD-10-CM

## 2014-01-18 DIAGNOSIS — J9601 Acute respiratory failure with hypoxia: Secondary | ICD-10-CM

## 2014-01-18 DIAGNOSIS — I359 Nonrheumatic aortic valve disorder, unspecified: Secondary | ICD-10-CM | POA: Diagnosis present

## 2014-01-18 DIAGNOSIS — Z800 Family history of malignant neoplasm of digestive organs: Secondary | ICD-10-CM

## 2014-01-18 DIAGNOSIS — Z66 Do not resuscitate: Secondary | ICD-10-CM | POA: Diagnosis present

## 2014-01-18 DIAGNOSIS — J189 Pneumonia, unspecified organism: Secondary | ICD-10-CM

## 2014-01-18 DIAGNOSIS — E860 Dehydration: Secondary | ICD-10-CM | POA: Diagnosis present

## 2014-01-18 DIAGNOSIS — F03918 Unspecified dementia, unspecified severity, with other behavioral disturbance: Secondary | ICD-10-CM | POA: Diagnosis present

## 2014-01-18 DIAGNOSIS — I10 Essential (primary) hypertension: Secondary | ICD-10-CM

## 2014-01-18 DIAGNOSIS — F0391 Unspecified dementia with behavioral disturbance: Secondary | ICD-10-CM

## 2014-01-18 DIAGNOSIS — J9600 Acute respiratory failure, unspecified whether with hypoxia or hypercapnia: Secondary | ICD-10-CM | POA: Diagnosis present

## 2014-01-18 DIAGNOSIS — R6520 Severe sepsis without septic shock: Secondary | ICD-10-CM

## 2014-01-18 DIAGNOSIS — R451 Restlessness and agitation: Secondary | ICD-10-CM

## 2014-01-18 DIAGNOSIS — Z515 Encounter for palliative care: Secondary | ICD-10-CM

## 2014-01-18 DIAGNOSIS — A419 Sepsis, unspecified organism: Principal | ICD-10-CM

## 2014-01-18 DIAGNOSIS — E1149 Type 2 diabetes mellitus with other diabetic neurological complication: Secondary | ICD-10-CM | POA: Diagnosis present

## 2014-01-18 DIAGNOSIS — N179 Acute kidney failure, unspecified: Secondary | ICD-10-CM

## 2014-01-18 DIAGNOSIS — Z9181 History of falling: Secondary | ICD-10-CM

## 2014-01-18 DIAGNOSIS — E871 Hypo-osmolality and hyponatremia: Secondary | ICD-10-CM | POA: Diagnosis present

## 2014-01-18 DIAGNOSIS — Z820 Family history of epilepsy and other diseases of the nervous system: Secondary | ICD-10-CM

## 2014-01-18 DIAGNOSIS — K219 Gastro-esophageal reflux disease without esophagitis: Secondary | ICD-10-CM | POA: Diagnosis present

## 2014-01-18 DIAGNOSIS — Z79899 Other long term (current) drug therapy: Secondary | ICD-10-CM

## 2014-01-18 DIAGNOSIS — I4891 Unspecified atrial fibrillation: Secondary | ICD-10-CM | POA: Diagnosis present

## 2014-01-18 LAB — URINALYSIS, ROUTINE W REFLEX MICROSCOPIC
Bilirubin Urine: NEGATIVE
GLUCOSE, UA: NEGATIVE mg/dL
Hgb urine dipstick: NEGATIVE
KETONES UR: NEGATIVE mg/dL
Leukocytes, UA: NEGATIVE
Nitrite: NEGATIVE
PROTEIN: 100 mg/dL — AB
Specific Gravity, Urine: 1.021 (ref 1.005–1.030)
Urobilinogen, UA: 0.2 mg/dL (ref 0.0–1.0)
pH: 5 (ref 5.0–8.0)

## 2014-01-18 LAB — I-STAT CG4 LACTIC ACID, ED: Lactic Acid, Venous: 3.04 mmol/L — ABNORMAL HIGH (ref 0.5–2.2)

## 2014-01-18 LAB — CBC WITH DIFFERENTIAL/PLATELET
Basophils Absolute: 0 10*3/uL (ref 0.0–0.1)
Basophils Relative: 0 % (ref 0–1)
EOS PCT: 0 % (ref 0–5)
Eosinophils Absolute: 0 10*3/uL (ref 0.0–0.7)
HEMATOCRIT: 31.1 % — AB (ref 39.0–52.0)
Hemoglobin: 10.3 g/dL — ABNORMAL LOW (ref 13.0–17.0)
LYMPHS ABS: 0.9 10*3/uL (ref 0.7–4.0)
LYMPHS PCT: 6 % — AB (ref 12–46)
MCH: 33.9 pg (ref 26.0–34.0)
MCHC: 33.1 g/dL (ref 30.0–36.0)
MCV: 102.3 fL — AB (ref 78.0–100.0)
MONO ABS: 1 10*3/uL (ref 0.1–1.0)
Monocytes Relative: 6 % (ref 3–12)
NEUTROS ABS: 14.1 10*3/uL — AB (ref 1.7–7.7)
Neutrophils Relative %: 88 % — ABNORMAL HIGH (ref 43–77)
Platelets: 224 10*3/uL (ref 150–400)
RBC: 3.04 MIL/uL — AB (ref 4.22–5.81)
RDW: 16.1 % — ABNORMAL HIGH (ref 11.5–15.5)
WBC: 16.1 10*3/uL — AB (ref 4.0–10.5)

## 2014-01-18 LAB — COMPREHENSIVE METABOLIC PANEL
ALT: 26 U/L (ref 0–53)
ANION GAP: 15 (ref 5–15)
AST: 46 U/L — ABNORMAL HIGH (ref 0–37)
Albumin: 3 g/dL — ABNORMAL LOW (ref 3.5–5.2)
Alkaline Phosphatase: 81 U/L (ref 39–117)
BILIRUBIN TOTAL: 1.9 mg/dL — AB (ref 0.3–1.2)
BUN: 41 mg/dL — ABNORMAL HIGH (ref 6–23)
CHLORIDE: 108 meq/L (ref 96–112)
CO2: 23 meq/L (ref 19–32)
Calcium: 10 mg/dL (ref 8.4–10.5)
Creatinine, Ser: 1.6 mg/dL — ABNORMAL HIGH (ref 0.50–1.35)
GFR calc Af Amer: 43 mL/min — ABNORMAL LOW (ref >90)
GFR, EST NON AFRICAN AMERICAN: 37 mL/min — AB (ref >90)
Glucose, Bld: 187 mg/dL — ABNORMAL HIGH (ref 70–99)
Potassium: 4.4 mEq/L (ref 3.7–5.3)
Sodium: 146 mEq/L (ref 137–147)
Total Protein: 7.4 g/dL (ref 6.0–8.3)

## 2014-01-18 LAB — URINE MICROSCOPIC-ADD ON

## 2014-01-18 MED ORDER — POTASSIUM CHLORIDE ER 10 MEQ PO TBCR
10.0000 meq | EXTENDED_RELEASE_TABLET | Freq: Two times a day (BID) | ORAL | Status: DC
  Administered 2014-01-19 – 2014-01-21 (×5): 10 meq via ORAL
  Filled 2014-01-18 (×7): qty 1

## 2014-01-18 MED ORDER — SODIUM CHLORIDE 0.9 % IV SOLN
INTRAVENOUS | Status: DC
  Administered 2014-01-18 – 2014-01-20 (×2): via INTRAVENOUS

## 2014-01-18 MED ORDER — LOSARTAN POTASSIUM 50 MG PO TABS
50.0000 mg | ORAL_TABLET | Freq: Every day | ORAL | Status: DC
  Administered 2014-01-19 – 2014-01-21 (×3): 50 mg via ORAL
  Filled 2014-01-18 (×3): qty 1

## 2014-01-18 MED ORDER — SENNA 8.6 MG PO TABS
2.0000 | ORAL_TABLET | Freq: Every day | ORAL | Status: DC
  Administered 2014-01-19 – 2014-01-20 (×2): 17.2 mg via ORAL
  Filled 2014-01-18 (×2): qty 2

## 2014-01-18 MED ORDER — CLONAZEPAM 1 MG PO TABS
1.5000 mg | ORAL_TABLET | Freq: Every day | ORAL | Status: DC
  Administered 2014-01-19 – 2014-01-20 (×2): 1.5 mg via ORAL
  Filled 2014-01-18 (×4): qty 1

## 2014-01-18 MED ORDER — TRAMADOL HCL 50 MG PO TABS
50.0000 mg | ORAL_TABLET | Freq: Two times a day (BID) | ORAL | Status: DC
  Administered 2014-01-19 – 2014-01-21 (×5): 50 mg via ORAL
  Filled 2014-01-18 (×5): qty 1

## 2014-01-18 MED ORDER — CLONAZEPAM 0.5 MG PO TABS
0.5000 mg | ORAL_TABLET | Freq: Two times a day (BID) | ORAL | Status: DC
  Administered 2014-01-19 – 2014-01-21 (×4): 0.5 mg via ORAL
  Filled 2014-01-18 (×4): qty 1

## 2014-01-18 MED ORDER — DEXTROSE 5 % IV SOLN
500.0000 mg | INTRAVENOUS | Status: DC
  Administered 2014-01-18: 500 mg via INTRAVENOUS
  Filled 2014-01-18: qty 500

## 2014-01-18 MED ORDER — ONDANSETRON 4 MG PO TBDP
4.0000 mg | ORAL_TABLET | Freq: Three times a day (TID) | ORAL | Status: DC | PRN
  Filled 2014-01-18: qty 1

## 2014-01-18 MED ORDER — ACETAMINOPHEN 325 MG PO TABS: 650.0000 mg | ORAL_TABLET | Freq: Four times a day (QID) | ORAL | Status: DC | PRN

## 2014-01-18 MED ORDER — MORPHINE SULFATE 15 MG PO TABS: 15.0000 mg | ORAL_TABLET | Freq: Two times a day (BID) | ORAL | Status: DC | PRN

## 2014-01-18 MED ORDER — SERTRALINE HCL 50 MG PO TABS
50.0000 mg | ORAL_TABLET | Freq: Two times a day (BID) | ORAL | Status: DC
  Administered 2014-01-19 – 2014-01-23 (×10): 50 mg via ORAL
  Filled 2014-01-18 (×13): qty 1

## 2014-01-18 MED ORDER — CEFEPIME HCL 1 G IJ SOLR
1.0000 g | Freq: Every day | INTRAMUSCULAR | Status: DC
  Administered 2014-01-19 – 2014-01-20 (×3): 1 g via INTRAVENOUS
  Filled 2014-01-18 (×3): qty 1

## 2014-01-18 MED ORDER — ACETAMINOPHEN 650 MG RE SUPP
650.0000 mg | Freq: Once | RECTAL | Status: AC
  Administered 2014-01-18: 650 mg via RECTAL
  Filled 2014-01-18: qty 1

## 2014-01-18 MED ORDER — BIOTENE DRY MOUTH MT LIQD
15.0000 mL | Freq: Two times a day (BID) | OROMUCOSAL | Status: DC
  Administered 2014-01-20: 15 mL via OROMUCOSAL

## 2014-01-18 MED ORDER — ARIPIPRAZOLE 5 MG PO TABS
5.0000 mg | ORAL_TABLET | Freq: Every day | ORAL | Status: DC
  Administered 2014-01-19 – 2014-01-20 (×3): 5 mg via ORAL
  Filled 2014-01-18 (×4): qty 1

## 2014-01-18 MED ORDER — SODIUM CHLORIDE 0.9 % IV SOLN
INTRAVENOUS | Status: DC
  Administered 2014-01-18 – 2014-01-24 (×3): via INTRAVENOUS

## 2014-01-18 MED ORDER — QUETIAPINE FUMARATE 50 MG PO TABS
50.0000 mg | ORAL_TABLET | Freq: Every day | ORAL | Status: DC
  Administered 2014-01-19 – 2014-01-20 (×3): 50 mg via ORAL
  Filled 2014-01-18 (×4): qty 1

## 2014-01-18 MED ORDER — ENOXAPARIN SODIUM 30 MG/0.3ML ~~LOC~~ SOLN
30.0000 mg | SUBCUTANEOUS | Status: DC
  Administered 2014-01-19 – 2014-01-20 (×3): 30 mg via SUBCUTANEOUS
  Filled 2014-01-18 (×4): qty 0.3

## 2014-01-18 MED ORDER — FUROSEMIDE 40 MG PO TABS
40.0000 mg | ORAL_TABLET | Freq: Every day | ORAL | Status: DC
  Administered 2014-01-19 – 2014-01-21 (×3): 40 mg via ORAL
  Filled 2014-01-18 (×3): qty 1

## 2014-01-18 MED ORDER — HALOPERIDOL LACTATE 5 MG/ML IJ SOLN
2.5000 mg | Freq: Four times a day (QID) | INTRAMUSCULAR | Status: DC | PRN
  Administered 2014-01-18 – 2014-01-21 (×7): 2.5 mg via INTRAVENOUS
  Filled 2014-01-18 (×7): qty 1

## 2014-01-18 MED ORDER — DEXTROSE 5 % IV SOLN
1.0000 g | INTRAVENOUS | Status: DC
  Administered 2014-01-18: 1 g via INTRAVENOUS
  Filled 2014-01-18: qty 10

## 2014-01-18 MED ORDER — DILTIAZEM HCL 25 MG/5ML IV SOLN
5.0000 mg | Freq: Once | INTRAVENOUS | Status: AC
  Administered 2014-01-18: 5 mg via INTRAVENOUS
  Filled 2014-01-18: qty 5

## 2014-01-18 NOTE — ED Notes (Signed)
Attempted calling report. RN will call back in 10 minutes.

## 2014-01-18 NOTE — ED Notes (Signed)
Bed: WA21 Expected date:  Expected time:  Means of arrival:  Comments: EMS 89M pneumonia per XR

## 2014-01-18 NOTE — ED Provider Notes (Addendum)
CSN: [id number]     Arrival date & time 01/18/14  1946 History   First MD Initiated Contact with Patient 01/18/14 1957     Chief Complaint  Patient presents with  . Pneumonia     (Consider location/radiation/quality/duration/timing/severity/associated sxs/prior Treatment) HPI Comments: Patient here with cough shortness of breath x48 hours. Patient had a chest x-ray done at the facility which showed a right lower lobe pneumonia. Patient is a DO NOT RESUSCITATE and has a most form which stated that the patient did not want IV fluids but with possibly more antibiotics. I confirmed this with his wife. Symptoms persistent and no treatment used prior to arrival. EMS called and patient transported here  Patient is a 80 y.o. male presenting with pneumonia. The history is provided by the spouse. The history is limited by the condition of the patient.  Pneumonia    Past Medical History  Diagnosis Date  . Carotid artery stenosis   . Gait disturbance   . Anxiety   . GERD (gastroesophageal reflux disease)   . Vitamin B12 deficiency   . Depression   . Osteoarthritis   . Diabetes mellitus, type 2   . Hypertension   . Vitamin D deficiency   . Tremor   . Aortic stenosis     murmur  . Hyperlipidemia   . Diverticulosis   . Adenomatous polyp of colon   . Memory loss   . Polyneuropathy in diabetes(357.2)   . Dementia   . Dysrhythmia   . BPH (benign prostatic hyperplasia)    Past Surgical History  Procedure Laterality Date  . Vasectomy    . Lumbar laminectomy      L5 Dr Lorin [facility]  . Transurethral resection of prostate  March 2007  . Cataract extraction, bilateral Bilateral   . Tonsillectomy and adenoidectomy    . Left foot      fracture of left foot   Family History  Problem Relation Age of Onset  . Diabetes Father   . Colon cancer Brother   . Heart disease Brother   . Diabetes Brother   . Parkinson's disease Sister   . Arthritis Sister   . Alzheimer's disease Sister    History   Substance Use Topics  . Smoking status: Former Smoker    Types: Cigars  . Smokeless tobacco: Never Used  . Alcohol Use: No    Review of Systems  Unable to perform ROS     Allergies  Citalopram hydrobromide and Duloxetine  Home Medications   Prior to Admission medications   Medication Sig Start Date End Date Taking? Authorizing Provider  acetaminophen (TYLENOL) 325 MG tablet Take 650 mg by mouth every 6 (six) hours as needed (pain).    Yes Historical Provider, MD  ARIPiprazole (ABILIFY) 5 MG tablet Take 5 mg by mouth at bedtime.   Yes Historical Provider, MD  clonazePAM (KLONOPIN) 0.5 MG tablet Use 0.5 mg at breakfast and 0.5 mg at lunch. Use 1.5 mg at bedtime. 12/19/13  Yes Aleksei Plotnikov V, MD  diclofenac sodium (VOLTAREN) 1 % GEL Apply 2 g topically 2 (two) times daily as needed (pain left ankle and left hip). For pain   Yes Historical Provider, MD  furosemide (LASIX) 40 MG tablet Take 40 mg by mouth daily.   Yes Historical Provider, MD  losartan (COZAAR) 50 MG tablet Take 1 tablet (50 mg total) by mouth daily. 06/06/13  Yes Aleksei Plotnikov V, MD  morphine (MSIR) 15 MG tablet Take 1 tablet (15 mg  total) by mouth 2 (two) times daily as needed for severe pain. 10/23/13  Yes Aleksei Plotnikov V, MD  ondansetron (ZOFRAN-ODT) 4 MG disintegrating tablet Take 4 mg by mouth every 8 (eight) hours as needed for nausea or vomiting (nausea and vomiting).   Yes Historical Provider, MD  potassium chloride (KLOR-CON 10) 10 MEQ tablet Take 1 tablet (10 mEq total) by mouth 2 (two) times daily. 09/25/13  Yes Aleksei Plotnikov V, MD  QUEtiapine (SEROQUEL) 50 MG tablet Take 50 mg by mouth at bedtime.   Yes Historical Provider, MD  senna (SENOKOT) 8.6 MG TABS tablet Take 2 tablets by mouth daily.   Yes Historical Provider, MD  sertraline (ZOLOFT) 50 MG tablet Take 1 tablet (50 mg total) by mouth 2 (two) times daily. 12/19/13  Yes Aleksei Plotnikov V, MD  traMADol (ULTRAM) 50 MG tablet Take 50 mg by  mouth 2 (two) times daily.   Yes Historical Provider, MD   BP 173/155  Pulse 130  Temp(Src) 100.9 F (38.3 C) (Rectal)  Resp 32  SpO2 98% Physical Exam  Nursing note and vitals reviewed. Constitutional: He appears lethargic. He appears cachectic. He appears toxic. He has a sickly appearance. He appears ill. He appears distressed.  HENT:  Head: Normocephalic and atraumatic.  Eyes: Conjunctivae, EOM and lids are normal. Pupils are equal, round, and reactive to light.  Neck: Normal range of motion. Neck supple. No tracheal deviation present. No mass present.  Cardiovascular: Regular rhythm and normal heart sounds.  Tachycardia present.  Exam reveals no gallop.   No murmur heard. Pulmonary/Chest: Effort normal. No stridor. No respiratory distress. He has decreased breath sounds. He has wheezes. He has no rhonchi. He has no rales.  Abdominal: Soft. Normal appearance and bowel sounds are normal. He exhibits no distension. There is no tenderness. There is no rebound and no CVA tenderness.  Musculoskeletal: Normal range of motion. He exhibits no edema and no tenderness.  Neurological: He has normal strength. He appears lethargic. GCS eye subscore is 2. GCS verbal subscore is 2. GCS motor subscore is 4.  Skin: Skin is warm and dry. No abrasion and no rash noted.    ED Course  Procedures (including critical care time) Labs Review Labs Reviewed  CULTURE, BLOOD (ROUTINE X 2)  CULTURE, BLOOD (ROUTINE X 2)  URINE CULTURE  CBC WITH DIFFERENTIAL  COMPREHENSIVE METABOLIC PANEL  URINALYSIS, ROUTINE W REFLEX MICROSCOPIC  I-STAT CG4 LACTIC ACID, ED    Imaging Review No results found.   EKG Interpretation   Date/Time:  Thursday January 18 2014 19:54:41 EDT Ventricular Rate:  124 PR Interval:    QRS Duration: 79 QT Interval:  326 QTC Calculation: 468 R Axis:   92 Text Interpretation:  Atrial flutter with predominant 2:1 AV block Lateral  infarct, recent Probable anteroseptal infarct, recent  new from prior  Confirmed by Gola Bribiesca  MD, Akima Slaugh ([id number]) on 01/18/2014 8:30:52 PM      MDM   Final diagnoses:  None    Discussed the patient's condition at length with his wife. She would like the patient received IV antibiotics at this time. She is agreeable to him being a DO NOT RESUSCITATE. Have also spoken with the patient's stepdaughter who is a physician and explained to her the seriousness of his condition. She would like the patient to receive IV antibiotics as well. Patient's fever treated with Tylenol. I suspect that that is what is making him go into rapid ventricular rate response. Patient be given Cardizem  5 mg.  CRITICAL CARE Performed by: Leota Jacobsen Total critical care time: 40 Critical care time was exclusive of separately billable procedures and treating other patients. Critical care was necessary to treat or prevent imminent or life-threatening deterioration. Critical care was time spent personally by me on the following activities: development of treatment plan with patient and/or surrogate as well as nursing, discussions with consultants, evaluation of patient's response to treatment, examination of patient, obtaining history from patient or surrogate, ordering and performing treatments and interventions, ordering and review of laboratory studies, ordering and review of radiographic studies, pulse oximetry and re-evaluation of patient's condition.     Leota Jacobsen, MD 01/18/14 2031  Leota Jacobsen, MD 01/18/14 [id number]

## 2014-01-18 NOTE — ED Notes (Signed)
Per EMS-from [facility]. Had CXR today which confirmed PNA. Rhonchi, diminished lung sounds noted bilaterally. 12 Lead unremarkable. DNR form with MOST form which states "no IV fluids."  BP 164/88 HR 114 SpO2 94% on 10L non-rebreather. Hx afib, HTN.

## 2014-01-19 DIAGNOSIS — J189 Pneumonia, unspecified organism: Secondary | ICD-10-CM | POA: Diagnosis not present

## 2014-01-19 DIAGNOSIS — J9600 Acute respiratory failure, unspecified whether with hypoxia or hypercapnia: Secondary | ICD-10-CM | POA: Diagnosis not present

## 2014-01-19 DIAGNOSIS — A419 Sepsis, unspecified organism: Secondary | ICD-10-CM | POA: Diagnosis not present

## 2014-01-19 LAB — GLUCOSE, CAPILLARY
Glucose-Capillary: 159 mg/dL — ABNORMAL HIGH (ref 70–99)
Glucose-Capillary: 136 mg/dL — ABNORMAL HIGH (ref 70–99)
Glucose-Capillary: 155 mg/dL — ABNORMAL HIGH (ref 70–99)

## 2014-01-19 LAB — URINE CULTURE
Colony Count: NO GROWTH
Culture: NO GROWTH

## 2014-01-19 LAB — CBC
HCT: 26.3 % — ABNORMAL LOW (ref 39.0–52.0)
Hemoglobin: 8.8 g/dL — ABNORMAL LOW (ref 13.0–17.0)
MCH: 33.8 pg (ref 26.0–34.0)
MCHC: 33.5 g/dL (ref 30.0–36.0)
MCV: 101.2 fL — ABNORMAL HIGH (ref 78.0–100.0)
PLATELETS: 177 10*3/uL (ref 150–400)
RBC: 2.6 MIL/uL — ABNORMAL LOW (ref 4.22–5.81)
RDW: 16 % — ABNORMAL HIGH (ref 11.5–15.5)
WBC: 13.5 10*3/uL — ABNORMAL HIGH (ref 4.0–10.5)

## 2014-01-19 LAB — BASIC METABOLIC PANEL
ANION GAP: 10 (ref 5–15)
BUN: 45 mg/dL — ABNORMAL HIGH (ref 6–23)
CALCIUM: 9.4 mg/dL (ref 8.4–10.5)
CO2: 26 meq/L (ref 19–32)
Chloride: 110 mEq/L (ref 96–112)
Creatinine, Ser: 1.69 mg/dL — ABNORMAL HIGH (ref 0.50–1.35)
GFR calc Af Amer: 40 mL/min — ABNORMAL LOW (ref >90)
GFR, EST NON AFRICAN AMERICAN: 35 mL/min — AB (ref >90)
Glucose, Bld: 167 mg/dL — ABNORMAL HIGH (ref 70–99)
POTASSIUM: 4.1 meq/L (ref 3.7–5.3)
Sodium: 146 mEq/L (ref 137–147)

## 2014-01-19 MED ORDER — VANCOMYCIN HCL IN DEXTROSE 750-5 MG/150ML-% IV SOLN
750.0000 mg | Freq: Every day | INTRAVENOUS | Status: DC
  Administered 2014-01-18 – 2014-01-20 (×3): 750 mg via INTRAVENOUS
  Filled 2014-01-19 (×3): qty 150

## 2014-01-19 MED ORDER — INSULIN ASPART 100 UNIT/ML ~~LOC~~ SOLN
0.0000 [IU] | Freq: Three times a day (TID) | SUBCUTANEOUS | Status: DC
  Administered 2014-01-19 – 2014-01-20 (×3): 3 [IU] via SUBCUTANEOUS

## 2014-01-19 MED ORDER — HALOPERIDOL LACTATE 5 MG/ML IJ SOLN
1.0000 mg | Freq: Once | INTRAMUSCULAR | Status: AC
  Administered 2014-01-19: 1 mg via INTRAVENOUS
  Filled 2014-01-19: qty 1

## 2014-01-19 NOTE — H&P (Signed)
[name] is an 80 y.o. male.   Chief Complaint: Increased agitation, confusion, fever, congestion, and poor PO intake. HPI: The patient has had increased agitation, confusion, fever, congestion, and poor PO intake since yesterday. Pt is a receiving hospice care.  His family states that he is comfort care, but that they want him to receive IV antibiotics and IV fluids. He is a DNR. The wife states that she does not want the patient's DM treated. She also does not want any further treatment of the patient's atrial fibrillation. He did receive IV diltiazem upon presentation to the ED in atrial fibrillation with RVR.  Past Medical History  Diagnosis Date  . Carotid artery stenosis   . Gait disturbance   . Anxiety   . GERD (gastroesophageal reflux disease)   . Vitamin B12 deficiency   . Depression   . Osteoarthritis   . Diabetes mellitus, type 2   . Hypertension   . Vitamin D deficiency   . Tremor   . Aortic stenosis     murmur  . Hyperlipidemia   . Diverticulosis   . Adenomatous polyp of colon   . Memory loss   . Polyneuropathy in diabetes(357.2)   . Dementia   . Dysrhythmia   . BPH (benign prostatic hyperplasia)     Past Surgical History  Procedure Laterality Date  . Vasectomy    . Lumbar laminectomy      L5 Dr Lorin [facility]  . Transurethral resection of prostate  March 2007  . Cataract extraction, bilateral Bilateral   . Tonsillectomy and adenoidectomy    . Left foot      fracture of left foot    Family History  Problem Relation Age of Onset  . Diabetes Father   . Colon cancer Brother   . Heart disease Brother   . Diabetes Brother   . Parkinson's disease Sister   . Arthritis Sister   . Alzheimer's disease Sister    Social History:  reports that he has quit smoking. His smoking use included Cigars. He has never used smokeless tobacco. He reports that he does not drink alcohol or use illicit drugs.  Allergies:  Allergies  Allergen Reactions  . Citalopram  Hydrobromide Other (See Comments)    no reaction, refused to take  . Duloxetine Other (See Comments)    did not like, refused to continue    Medications Prior to Admission  Medication Sig Dispense Refill  . acetaminophen (TYLENOL) 325 MG tablet Take 650 mg by mouth every 6 (six) hours as needed (pain).       . ARIPiprazole (ABILIFY) 5 MG tablet Take 5 mg by mouth at bedtime.      . clonazePAM (KLONOPIN) 0.5 MG tablet Use 0.5 mg at breakfast and 0.5 mg at lunch. Use 1.5 mg at bedtime.  105 tablet  3  . diclofenac sodium (VOLTAREN) 1 % GEL Apply 2 g topically 2 (two) times daily as needed (pain left ankle and left hip). For pain      . furosemide (LASIX) 40 MG tablet Take 40 mg by mouth daily.      Marland Kitchen losartan (COZAAR) 50 MG tablet Take 1 tablet (50 mg total) by mouth daily.  30 tablet  5  . morphine (MSIR) 15 MG tablet Take 1 tablet (15 mg total) by mouth 2 (two) times daily as needed for severe pain.  60 tablet  0  . ondansetron (ZOFRAN-ODT) 4 MG disintegrating tablet Take 4 mg by mouth every 8 (  eight) hours as needed for nausea or vomiting (nausea and vomiting).      . potassium chloride (KLOR-CON 10) 10 MEQ tablet Take 1 tablet (10 mEq total) by mouth 2 (two) times daily.  180 tablet  3  . QUEtiapine (SEROQUEL) 50 MG tablet Take 50 mg by mouth at bedtime.      . senna (SENOKOT) 8.6 MG TABS tablet Take 2 tablets by mouth daily.      . sertraline (ZOLOFT) 50 MG tablet Take 1 tablet (50 mg total) by mouth 2 (two) times daily.  60 tablet  5  . traMADol (ULTRAM) 50 MG tablet Take 50 mg by mouth 2 (two) times daily.        Results for orders placed during the hospital encounter of 01/18/14 (from the past 48 hour(s))  CBC WITH DIFFERENTIAL     Status: Abnormal   Collection Time    01/18/14  8:35 PM      Result Value Ref Range   WBC 16.1 (*) 4.0 - 10.5 K/uL   RBC 3.04 (*) 4.22 - 5.81 MIL/uL   Hemoglobin 10.3 (*) 13.0 - 17.0 g/dL   HCT 31.1 (*) 39.0 - 52.0 %   MCV 102.3 (*) 78.0 - 100.0 fL    MCH 33.9  26.0 - 34.0 pg   MCHC 33.1  30.0 - 36.0 g/dL   RDW 16.1 (*) 11.5 - 15.5 %   Platelets 224  150 - 400 K/uL   Neutrophils Relative % 88 (*) 43 - 77 %   Neutro Abs 14.1 (*) 1.7 - 7.7 K/uL   Lymphocytes Relative 6 (*) 12 - 46 %   Lymphs Abs 0.9  0.7 - 4.0 K/uL   Monocytes Relative 6  3 - 12 %   Monocytes Absolute 1.0  0.1 - 1.0 K/uL   Eosinophils Relative 0  0 - 5 %   Eosinophils Absolute 0.0  0.0 - 0.7 K/uL   Basophils Relative 0  0 - 1 %   Basophils Absolute 0.0  0.0 - 0.1 K/uL  COMPREHENSIVE METABOLIC PANEL     Status: Abnormal   Collection Time    01/18/14  8:35 PM      Result Value Ref Range   Sodium 146  137 - 147 mEq/L   Potassium 4.4  3.7 - 5.3 mEq/L   Chloride 108  96 - 112 mEq/L   CO2 23  19 - 32 mEq/L   Glucose, Bld 187 (*) 70 - 99 mg/dL   BUN 41 (*) 6 - 23 mg/dL   Creatinine, Ser 1.60 (*) 0.50 - 1.35 mg/dL   Calcium 10.0  8.4 - 10.5 mg/dL   Total Protein 7.4  6.0 - 8.3 g/dL   Albumin 3.0 (*) 3.5 - 5.2 g/dL   AST 46 (*) 0 - 37 U/L   ALT 26  0 - 53 U/L   Alkaline Phosphatase 81  39 - 117 U/L   Total Bilirubin 1.9 (*) 0.3 - 1.2 mg/dL   GFR calc non Af Amer 37 (*) >90 mL/min   GFR calc Af Amer 43 (*) >90 mL/min   Comment: (NOTE)     The eGFR has been calculated using the CKD EPI equation.     This calculation has not been validated in all clinical situations.     eGFR's persistently <90 mL/min signify possible Chronic Kidney     Disease.   Anion gap 15  5 - 15  I-STAT CG4 LACTIC ACID, ED  Status: Abnormal   Collection Time    01/18/14  8:53 PM      Result Value Ref Range   Lactic Acid, Venous 3.04 (*) 0.5 - 2.2 mmol/L  URINALYSIS, ROUTINE W REFLEX MICROSCOPIC     Status: Abnormal   Collection Time    01/18/14  9:24 PM      Result Value Ref Range   Color, Urine YELLOW  YELLOW   APPearance CLEAR  CLEAR   Specific Gravity, Urine 1.021  1.005 - 1.030   pH 5.0  5.0 - 8.0   Glucose, UA NEGATIVE  NEGATIVE mg/dL   Hgb urine dipstick NEGATIVE  NEGATIVE    Bilirubin Urine NEGATIVE  NEGATIVE   Ketones, ur NEGATIVE  NEGATIVE mg/dL   Protein, ur 100 (*) NEGATIVE mg/dL   Urobilinogen, UA 0.2  0.0 - 1.0 mg/dL   Nitrite NEGATIVE  NEGATIVE   Leukocytes, UA NEGATIVE  NEGATIVE  URINE MICROSCOPIC-ADD ON     Status: Abnormal   Collection Time    01/18/14  9:24 PM      Result Value Ref Range   Bacteria, UA FEW (*) RARE   Urine-Other MUCOUS PRESENT     Dg Chest Port 1 View  01/18/2014   CLINICAL DATA:  Shortness of breath and weakness.  EXAM: PORTABLE CHEST - 1 VIEW  COMPARISON:  Single view of the chest 06/20/2013.  FINDINGS: There is right much worse than left airspace disease. Cardiomegaly is identified. No pneumothorax or pleural effusion.  IMPRESSION: Right much worse than left airspace disease is most worrisome for pneumonia rather than asymmetric edema.   Electronically Signed   By: Inge Rise M.D.   On: 01/18/2014 20:29    Review of Systems  Unable to perform ROS: dementia    Blood pressure 123/76, pulse 88, temperature 98 F (36.7 C), temperature source Axillary, resp. rate 22, height 5' 2.99" (1.6 m), weight 59 kg (130 lb 1.1 oz), SpO2 99.00%. Physical Exam  Constitutional:  Pt appears chronically and acutely ill and cachectic. He is very confused and agitated.  His wife is present and states that this is a departure from his bseline mental status. He usually recognizes her and can converse, although he is no longer aware of day to day events.  HENT:  Head: Normocephalic and atraumatic.  Pt is unable to comply with examination of the oral cavity and pharynx.  Eyes: Conjunctivae are normal. Pupils are equal, round, and reactive to light. Right eye exhibits no discharge. Left eye exhibits no discharge. No scleral icterus.  Pt is unable to cooperate with EOM exam  Neck: Normal range of motion. Neck supple. No JVD present. No tracheal deviation present. No thyromegaly present.  Cardiovascular: Normal rate, regular rhythm and normal heart  sounds.  Exam reveals no gallop and no friction rub.   No murmur heard. Respiratory: He is in respiratory distress. He has wheezes. He has rales. He exhibits no tenderness.  Positive for increased work of breathin, tachypnea and accessory muscle use.  He is requiring 3L O2. Bilateral lower lobe rales, and rhonchi throughout.  GI: He exhibits no distension and no mass. There is no tenderness. There is no rebound and no guarding.  Musculoskeletal: He exhibits no edema and no tenderness.  Lymphadenopathy:    He has no cervical adenopathy.  Neurological: He displays normal reflexes. He exhibits normal muscle tone.  Pt is agitated and confused. He is unable to cooperate with exam.  He is moving all extremities.  Skin: Skin is warm and dry. No rash noted. No erythema. No pallor.  Psychiatric:  Pt is unable to cooperate with exam.     Assessment/Plan 1. Pneumonia - Bilateral infiltrates, Rt>Lt. IV antibiotics to cover for resistant and gram negative organisms. His residence in a "memory-care" facility puts him at risk for healthcare acquired pneumonia. 2. Sepsis with fever, leukocytosis, and tachycardia. - IV fluids 3. Atrial fibrillation with RVR - HR resolved with the bolus dose of diltiazem given in the ED, however the wife does not want any further treatment of the patient's atrial fibrillation. 4. DM. The patient has not received treatment or evaluation of his DM for months. The wife does not want this followed in the hospital either. 5. Dementia - Pt is on hospice and comfort care in a memory-care facility.  Hermenegildo Clausen 01/19/2014, 3:54 AM

## 2014-01-19 NOTE — Progress Notes (Signed)
Pt arrived to unit room  1516 w/ spouse. Pt arousable. VS taken.Pt guide at bedside. No complaint of pain or complications. Assessment completed. Will continue to monitor.

## 2014-01-19 NOTE — Progress Notes (Signed)
Inpatient RN visit-[name] [place] [facility] RN Visit-Karen Alford [place] RN  Related admission to [facility] diagnosis of Alzheimer's Dz. Pt is DNR code.   OOF DNR in place in patient's home. Pt seen at bedside, eyes closed, roused to gentle voice, startled, arms in the air.  Pt CG, Sybella present during visit. She reports that pt can be very combative with care. He has several ecchymotic areas to his upper arms and hands, skin tear to top of R hand, appears dry. Breakfast tray at bedside, pt did not eat this am.  Pt allowed writer to wipe his eyes with a warm wash cloth, no real verbalization noted, some mumbling.  Per cg and chart review pt had received Haldol this am for agitation. Pt resting with eyes closed at end of visit, Sybella at bedside. Per Cg Mrs.Render will be returning this afternoon. Emotional support offered.  Patient's home medication list, transfer summary and OOF DNR in place  on shadow chart.   Please call [facility] @ [phone]-with any hospice needs.   Thank you. Tracey Harries, RN  [facility]  Hospice Liaison  [phone])

## 2014-01-19 NOTE — Progress Notes (Signed)
[facility] PROGRESS NOTE Assessment/Plan:  HCAP (healthcare-associated pneumonia)/Sepsis/Acute respiratory failure with hypoxemia: - Started on Vanc. and cefepime 7.24.2015. Cont to have fevers. Leukocytosis improving. - haldol for agitation, on Seroquel at night.    Acute renal injury - cont IV fluids, most likely pre-renal. - baseline Cr ~ 1.0    Code Status: DNR Family Communication: wife  Disposition Plan: inpatient   Consultants:  None  Procedures:  CXR  Antibiotics:  Vanc and cefepime 7.24.2015  HPI/Subjective: sleeping  Objective: Filed Vitals:   01/18/14 2248 01/19/14 0006 01/19/14 0151 01/19/14 0551  BP:   123/76 104/55  Pulse:   88 105  Temp: 101.9 F (38.8 C)  98 F (36.7 C) 98.2 F (36.8 C)  TempSrc: Rectal  Axillary Axillary  Resp:   22 22  Height:  5' 2.99" (1.6 m)    Weight:  59 kg (130 lb 1.1 oz)    SpO2:   99% 98%    Intake/Output Summary (Last 24 hours) at 01/19/14 0729 Last data filed at 01/18/14 2136  Gross per 24 hour  Intake    320 ml  Output    200 ml  Net    120 ml   Filed Weights   01/19/14 0006  Weight: 59 kg (130 lb 1.1 oz)    Exam:  General:  in no acute distress.  HEENT: No bruits, no goiter.  Heart: Regular rate and rhythm, without murmurs, rubs, gallops.  Lungs: Good air movement, clear  Abdomen: Soft, nontender, nondistended, positive bowel sounds.    Data Reviewed: Basic Metabolic Panel:  Recent Labs Lab 01/18/14 2035 01/19/14 0500  NA 146 146  K 4.4 4.1  CL 108 110  CO2 23 26  GLUCOSE 187* 167*  BUN 41* 45*  CREATININE 1.60* 1.69*  CALCIUM 10.0 9.4   Liver Function Tests:  Recent Labs Lab 01/18/14 2035  AST 46*  ALT 26  ALKPHOS 81  BILITOT 1.9*  PROT 7.4  ALBUMIN 3.0*   No results found for this basename: LIPASE, AMYLASE,  in the last 168 hours No results found for this basename: AMMONIA,  in the last 168 hours CBC:  Recent Labs Lab 01/18/14 2035 01/19/14 0500  WBC  16.1* 13.5*  NEUTROABS 14.1*  --   HGB 10.3* 8.8*  HCT 31.1* 26.3*  MCV 102.3* 101.2*  PLT 224 177   Cardiac Enzymes: No results found for this basename: CKTOTAL, CKMB, CKMBINDEX, TROPONINI,  in the last 168 hours BNP (last 3 results) No results found for this basename: PROBNP,  in the last 8760 hours CBG:  Recent Labs Lab 01/19/14 0219  GLUCAP 159*    No results found for this or any previous visit (from the past 240 hour(s)).   Studies: Dg Chest Port 1 View  01/18/2014   CLINICAL DATA:  Shortness of breath and weakness.  EXAM: PORTABLE CHEST - 1 VIEW  COMPARISON:  Single view of the chest 06/20/2013.  FINDINGS: There is right much worse than left airspace disease. Cardiomegaly is identified. No pneumothorax or pleural effusion.  IMPRESSION: Right much worse than left airspace disease is most worrisome for pneumonia rather than asymmetric edema.   Electronically Signed   By: Inge Rise M.D.   On: 01/18/2014 20:29    Scheduled Meds: . antiseptic oral rinse  15 mL Mouth Rinse BID  . ARIPiprazole  5 mg Oral QHS  . ceFEPime (MAXIPIME) IV  1 g Intravenous QHS  . clonazePAM  0.5 mg Oral BID  .  clonazePAM  1.5 mg Oral QHS  . enoxaparin (LOVENOX) injection  30 mg Subcutaneous Q24H  . furosemide  40 mg Oral Daily  . insulin aspart  0-15 Units Subcutaneous TID WC  . losartan  50 mg Oral Daily  . potassium chloride  10 mEq Oral BID  . QUEtiapine  50 mg Oral QHS  . senna  2 tablet Oral Daily  . sertraline  50 mg Oral BID  . traMADol  50 mg Oral BID  . vancomycin  750 mg Intravenous QHS   Continuous Infusions: . sodium chloride 10 mL/hr at 01/18/14 2047  . sodium chloride 75 mL/hr at 01/18/14 Scurry, Zakiah Beckerman  [facility] Pager [phone]. If 8PM-8AM, please contact night-coverage at [email or web address], password [facility] 01/19/2014, 7:29 AM  LOS: 1 day      **Disclaimer: This note may have been dictated with voice recognition software. Similar sounding words  can inadvertently be transcribed and this note may contain transcription errors which may not have been corrected upon publication of note.**

## 2014-01-19 NOTE — Progress Notes (Signed)
ANTIBIOTIC CONSULT NOTE - INITIAL  Pharmacy Consult for vancomycin, cefepime, Pharmacy is consulted for antibiotic monitoring  Indication: pneumonia  Allergies  Allergen Reactions  . Citalopram Hydrobromide Other (See Comments)    no reaction, refused to take  . Duloxetine Other (See Comments)    did not like, refused to continue    Patient Measurements: Height: 5' 2.99" (160 cm) Weight: 130 lb 1.1 oz (59 kg) IBW/kg (Calculated) : 56.88 Adjusted Body Weight:   Vital Signs: Temp: 101.9 F (38.8 C) (07/23 2248) Temp src: Rectal (07/23 2248) BP: 148/118 mmHg (07/23 2144) Pulse Rate: 110 (07/23 2144) Intake/Output from previous day: 07/23 0701 - 07/24 0700 In: 320 [I.V.:20; IV Piggyback:300] Out: 200 [Urine:200] Intake/Output from this shift: Total I/O In: 320 [I.V.:20; IV Piggyback:300] Out: 200 [Urine:200]  Labs:  Recent Labs  01/18/14 2035  WBC 16.1*  HGB 10.3*  PLT 224  CREATININE 1.60*   Estimated Creatinine Clearance: 26.2 ml/min (by C-G formula based on Cr of 1.6). No results found for this basename: VANCOTROUGH, VANCOPEAK, VANCORANDOM, GENTTROUGH, GENTPEAK, GENTRANDOM, TOBRATROUGH, TOBRAPEAK, TOBRARND, AMIKACINPEAK, AMIKACINTROU, AMIKACIN,  in the last 72 hours   Microbiology: No results found for this or any previous visit (from the past 720 hour(s)).  Medical History: Past Medical History  Diagnosis Date  . Carotid artery stenosis   . Gait disturbance   . Anxiety   . GERD (gastroesophageal reflux disease)   . Vitamin B12 deficiency   . Depression   . Osteoarthritis   . Diabetes mellitus, type 2   . Hypertension   . Vitamin D deficiency   . Tremor   . Aortic stenosis     murmur  . Hyperlipidemia   . Diverticulosis   . Adenomatous polyp of colon   . Memory loss   . Polyneuropathy in diabetes(357.2)   . Dementia   . Dysrhythmia   . BPH (benign prostatic hyperplasia)     Medications:  Anti-infectives   Start     Dose/Rate Route Frequency  Ordered Stop   01/19/14 0015  ceFEPIme (MAXIPIME) 1 g in dextrose 5 % 50 mL IVPB     1 g 100 mL/hr over 30 Minutes Intravenous Daily at bedtime 01/18/14 2355 01/26/14 2159   01/19/14 0015  vancomycin (VANCOCIN) IVPB 750 mg/150 ml premix     750 mg 150 mL/hr over 60 Minutes Intravenous Daily at bedtime 01/19/14 0011     01/18/14 2045  azithromycin (ZITHROMAX) 500 mg in dextrose 5 % 250 mL IVPB  Status:  Discontinued     500 mg 250 mL/hr over 60 Minutes Intravenous Every 24 hours 01/18/14 2032 01/19/14 0008   01/18/14 2045  cefTRIAXone (ROCEPHIN) 1 g in dextrose 5 % 50 mL IVPB  Status:  Discontinued     1 g 100 mL/hr over 30 Minutes Intravenous Every 24 hours 01/18/14 2032 01/19/14 0008     Assessment: Patient with PNA.  First dose of antibiotics already given.  Patient with reduced renal function.  Goal of Therapy:  Vancomycin trough level 15-20 mcg/ml Cefepime dosed based on patient weight and renal function   Plan:  Measure antibiotic drug levels at steady state Follow up culture results Vancomycin 750mg  iv q24hr Cefepime 1gm iv q24hr  Tyler Deis, Shea Stakes [place] 01/19/2014,12:11 AM

## 2014-01-19 NOTE — Progress Notes (Signed)
Clinical Social Work Department BRIEF PSYCHOSOCIAL ASSESSMENT 01/19/2014  Patient:  [name], [name]     Account Number:  [id number]     Admit date:  01/18/2014  Clinical Social Worker:  Earlie Server  Date/Time:  01/19/2014 02:30 PM  Referred by:  Physician  Date Referred:  01/19/2014 Referred for  ALF Placement   Other Referral:   Interview type:  Patient Other interview type:    PSYCHOSOCIAL DATA Living Status:  FACILITY Admitted from facility:  [facility] Level of care:  Assisted Living Primary support name:  Letta Median Primary support relationship to patient:  SPOUSE Degree of support available:   Strong    CURRENT CONCERNS Current Concerns  Post-Acute Placement   Other Concerns:    SOCIAL WORK ASSESSMENT / PLAN CSW received referral due to patient being admitted from a facility. CSW reviewed chart and met with patient, wife, and caregiver at bedside. CSW introduced myself and explained role.    Patient laying in bed but did not participate in assessment. Wife confirms that patient remains at [facility] and hospice is assisting as well. Wife reports she plans for patient to return to ALF whenever he is medically stable. CSW explained DC process and agreeable to continue to follow.    CSW completed FL2 and placed in chart. CSW will continue to follow.   Assessment/plan status:  Psychosocial Support/Ongoing Assessment of Needs Other assessment/ plan:   Information/referral to community resources:   Will return to ALF    PATIENT'S/FAMILY'S RESPONSE TO PLAN OF CARE: Patient did not participate in assessment. Wife remembered CSW from previous admission and thanked CSW for visit. Wife reports she is relying on support system during this time due to stress being high. Wife is happy that caregiver is sitting with patient and reports no needs at this time.       [place], [place] [phone]

## 2014-01-20 DIAGNOSIS — I10 Essential (primary) hypertension: Secondary | ICD-10-CM

## 2014-01-20 LAB — BASIC METABOLIC PANEL
Anion gap: 16 — ABNORMAL HIGH (ref 5–15)
BUN: 53 mg/dL — ABNORMAL HIGH (ref 6–23)
CO2: 21 mEq/L (ref 19–32)
Calcium: 9.4 mg/dL (ref 8.4–10.5)
Chloride: 112 mEq/L (ref 96–112)
Creatinine, Ser: 1.63 mg/dL — ABNORMAL HIGH (ref 0.50–1.35)
GFR calc non Af Amer: 36 mL/min — ABNORMAL LOW (ref >90)
GFR, EST AFRICAN AMERICAN: 42 mL/min — AB (ref >90)
Glucose, Bld: 141 mg/dL — ABNORMAL HIGH (ref 70–99)
POTASSIUM: 3.8 meq/L (ref 3.7–5.3)
SODIUM: 149 meq/L — AB (ref 137–147)

## 2014-01-20 LAB — GLUCOSE, CAPILLARY: GLUCOSE-CAPILLARY: 156 mg/dL — AB (ref 70–99)

## 2014-01-20 MED ORDER — GLUCOSE 40 % PO GEL
ORAL | Status: AC
  Administered 2014-01-20: 37.5 g
  Filled 2014-01-20: qty 1

## 2014-01-20 MED ORDER — LORAZEPAM 2 MG/ML IJ SOLN: 1.0000 mg | Freq: Once | INTRAMUSCULAR | Status: DC

## 2014-01-20 MED ORDER — IPRATROPIUM-ALBUTEROL 0.5-2.5 (3) MG/3ML IN SOLN
3.0000 mL | RESPIRATORY_TRACT | Status: DC | PRN
  Administered 2014-01-20 – 2014-01-24 (×6): 3 mL via RESPIRATORY_TRACT
  Filled 2014-01-20 (×6): qty 3

## 2014-01-20 NOTE — Progress Notes (Signed)
[facility] PROGRESS NOTE Assessment/Plan:  HCAP (healthcare-associated pneumonia)/Sepsis/Acute respiratory failure with hypoxemia: - Started on Vanc. and cefepime 7.24.2015. Afebrile. Leukocytosis improving. - Haldol for agitation, on Seroquel at night. - Check 12 lead EKG. - Pt is alert, still non verbal.    Acute renal injury - KVO IV fluids, Cr still elevated, check a b-met - baseline Cr ~ 1.0    Code Status: DNR Family Communication: wife  Disposition Plan: inpatient   Consultants:  None  Procedures:  CXR  Antibiotics:  Vanc and cefepime 7.24.2015  HPI/Subjective: sleeping  Objective: Filed Vitals:   01/19/14 0551 01/19/14 1454 01/19/14 2054 01/20/14 0522  BP: 104/55 129/71 153/98 137/83  Pulse: 105 103 113 101  Temp: 98.2 F (36.8 C) 97.9 F (36.6 C) 97.9 F (36.6 C) 97.8 F (36.6 C)  TempSrc: Axillary Axillary Axillary Axillary  Resp: '22 18 18 18  ' Height:      Weight:      SpO2: 98% 88% 94% 94%    Intake/Output Summary (Last 24 hours) at 01/20/14 1047 Last data filed at 01/20/14 0700  Gross per 24 hour  Intake    960 ml  Output    200 ml  Net    760 ml   Filed Weights   01/19/14 0006  Weight: 59 kg (130 lb 1.1 oz)    Exam:  General:  in no acute distress.  HEENT: No bruits, no goiter.  Heart: Regular rate and rhythm. Lungs: Good air movement, clear  Abdomen: Soft, nontender, nondistended, positive bowel sounds.    Data Reviewed: Basic Metabolic Panel:  Recent Labs Lab 01/18/14 2035 01/19/14 0500 01/20/14 0550  NA 146 146 149*  K 4.4 4.1 3.8  CL 108 110 112  CO2 '23 26 21  ' GLUCOSE 187* 167* 141*  BUN 41* 45* 53*  CREATININE 1.60* 1.69* 1.63*  CALCIUM 10.0 9.4 9.4   Liver Function Tests:  Recent Labs Lab 01/18/14 2035  AST 46*  ALT 26  ALKPHOS 81  BILITOT 1.9*  PROT 7.4  ALBUMIN 3.0*   No results found for this basename: LIPASE, AMYLASE,  in the last 168 hours No results found for this basename:  AMMONIA,  in the last 168 hours CBC:  Recent Labs Lab 01/18/14 2035 01/19/14 0500  WBC 16.1* 13.5*  NEUTROABS 14.1*  --   HGB 10.3* 8.8*  HCT 31.1* 26.3*  MCV 102.3* 101.2*  PLT 224 177   Cardiac Enzymes: No results found for this basename: CKTOTAL, CKMB, CKMBINDEX, TROPONINI,  in the last 168 hours BNP (last 3 results) No results found for this basename: PROBNP,  in the last 8760 hours CBG:  Recent Labs Lab 01/19/14 0219 01/19/14 0758 01/19/14 1127 01/20/14 0738  GLUCAP 159* 136* 155* 156*    Recent Results (from the past 240 hour(s))  CULTURE, BLOOD (ROUTINE X 2)     Status: None   Collection Time    01/18/14  8:35 PM      Result Value Ref Range Status   Specimen Description BLOOD RIGHT FOREARM   Final   Special Requests BOTTLES DRAWN AEROBIC AND ANAEROBIC 5 ML   Final   Culture  Setup Time     Final   Value: 01/19/2014 00:46     Performed at [facility]   Culture     Final   Value:        BLOOD CULTURE RECEIVED NO GROWTH TO DATE CULTURE WILL BE HELD FOR 5 DAYS BEFORE ISSUING  A FINAL NEGATIVE REPORT     Performed at [facility]   Report Status PENDING   Incomplete  CULTURE, BLOOD (ROUTINE X 2)     Status: None   Collection Time    01/18/14  8:35 PM      Result Value Ref Range Status   Specimen Description BLOOD LEFT FOREARM   Final   Special Requests BOTTLES DRAWN AEROBIC ONLY 3 ML   Final   Culture  Setup Time     Final   Value: 01/19/2014 00:45     Performed at [facility]   Culture     Final   Value:        BLOOD CULTURE RECEIVED NO GROWTH TO DATE CULTURE WILL BE HELD FOR 5 DAYS BEFORE ISSUING A FINAL NEGATIVE REPORT     Performed at [facility]   Report Status PENDING   Incomplete  URINE CULTURE     Status: None   Collection Time    01/18/14  9:24 PM      Result Value Ref Range Status   Specimen Description URINE, CATHETERIZED   Final   Special Requests NONE   Final   Culture  Setup Time     Final   Value:  01/19/2014 01:37     Performed at [place]     Final   Value: NO GROWTH     Performed at [facility]   Culture     Final   Value: NO GROWTH     Performed at [facility]   Report Status 01/19/2014 FINAL   Final     Studies: Dg Chest Port 1 View  01/18/2014   CLINICAL DATA:  Shortness of breath and weakness.  EXAM: PORTABLE CHEST - 1 VIEW  COMPARISON:  Single view of the chest 06/20/2013.  FINDINGS: There is right much worse than left airspace disease. Cardiomegaly is identified. No pneumothorax or pleural effusion.  IMPRESSION: Right much worse than left airspace disease is most worrisome for pneumonia rather than asymmetric edema.   Electronically Signed   By: Inge Rise M.D.   On: 01/18/2014 20:29    Scheduled Meds: . antiseptic oral rinse  15 mL Mouth Rinse BID  . ARIPiprazole  5 mg Oral QHS  . ceFEPime (MAXIPIME) IV  1 g Intravenous QHS  . clonazePAM  0.5 mg Oral BID  . clonazePAM  1.5 mg Oral QHS  . enoxaparin (LOVENOX) injection  30 mg Subcutaneous Q24H  . furosemide  40 mg Oral Daily  . insulin aspart  0-15 Units Subcutaneous TID WC  . losartan  50 mg Oral Daily  . potassium chloride  10 mEq Oral BID  . QUEtiapine  50 mg Oral QHS  . senna  2 tablet Oral Daily  . sertraline  50 mg Oral BID  . traMADol  50 mg Oral BID  . vancomycin  750 mg Intravenous QHS   Continuous Infusions: . sodium chloride 10 mL/hr at 01/18/14 2047  . sodium chloride 75 mL/hr at 01/20/14 [place], Cami Delawder  [facility] Pager [phone]. If 8PM-8AM, please contact night-coverage at [email or web address], password [facility] 01/20/2014, 10:47 AM  LOS: 2 days      **Disclaimer: This note may have been dictated with voice recognition software. Similar sounding words can inadvertently be transcribed and this note may contain transcription errors which may not have been corrected upon publication of note.**

## 2014-01-20 NOTE — Progress Notes (Signed)
[facility] On Call MSW note: This is hospice related admission. Pt is DNR. Spouse sitting at bedside. Spouse reported that she felt pt was less agitated today. MSW explored hospital discharge plans with spouse. Spouse plans for pt to return to [place] with continued [facility] services. Pt continues to have a hired Actuary with pt at the hospital from 9am-2pm while spouse goes home. Spouse stays the rest of the time with pt even at night. Spouse was tearful during visit as she discussed pt's decline and uncertain future. MSW offered emotional support and active listening. MSW explored spouse's coping skills. Spouse feels her faith is her biggest coping skill at this time. Please call with questions and concerns.   Marilynne Halsted, MSW [phone]

## 2014-01-21 ENCOUNTER — Other Ambulatory Visit: Payer: Self-pay

## 2014-01-21 MED ORDER — HALOPERIDOL LACTATE 5 MG/ML IJ SOLN: 5.0000 mg | Freq: Four times a day (QID) | INTRAMUSCULAR | Status: DC | PRN

## 2014-01-21 MED ORDER — RISPERIDONE 2 MG PO TBDP
2.0000 mg | ORAL_TABLET | Freq: Every day | ORAL | Status: DC
  Administered 2014-01-21 – 2014-01-22 (×2): 2 mg via ORAL
  Filled 2014-01-21 (×4): qty 1

## 2014-01-21 MED ORDER — LEVOFLOXACIN 750 MG PO TABS: 750.0000 mg | ORAL_TABLET | Freq: Every day | ORAL | Status: DC

## 2014-01-21 MED ORDER — HALOPERIDOL LACTATE 5 MG/ML IJ SOLN
2.0000 mg | Freq: Four times a day (QID) | INTRAMUSCULAR | Status: DC | PRN
  Administered 2014-01-21 – 2014-01-23 (×5): 2 mg via INTRAVENOUS
  Filled 2014-01-21 (×5): qty 1

## 2014-01-21 MED ORDER — HYDROMORPHONE HCL PF 1 MG/ML IJ SOLN
0.5000 mg | INTRAMUSCULAR | Status: DC | PRN
  Administered 2014-01-22 – 2014-01-24 (×5): 0.5 mg via INTRAVENOUS
  Filled 2014-01-21 (×5): qty 1

## 2014-01-21 MED ORDER — LORAZEPAM 2 MG/ML IJ SOLN
1.0000 mg | Freq: Once | INTRAMUSCULAR | Status: AC
  Administered 2014-01-21: 1 mg via INTRAVENOUS
  Filled 2014-01-21: qty 1

## 2014-01-21 MED ORDER — DILTIAZEM HCL 30 MG PO TABS
30.0000 mg | ORAL_TABLET | Freq: Four times a day (QID) | ORAL | Status: DC
  Filled 2014-01-21 (×4): qty 1

## 2014-01-21 NOTE — Progress Notes (Addendum)
[facility] PROGRESS NOTE Assessment/Plan:  HCAP (healthcare-associated pneumonia)/Sepsis/Acute respiratory failure with hypoxemia: - Started on Vanc. and cefepime 7.24.2015. Afebrile.  - d/w family an they would like to move toward comfort care as his quality of life has deteriorated significantly over the last year. - Haldol for agitation, on Seroquel at night. - Pt's is hospice. Re-consult hospice. - d/c antibiotics Bp medications. - Start ativan and dilaudid.    Acute renal injury - baseline Cr ~ 1.0.   New onset a.fib: - start diltiazem. Chads2-vas of 4. - will d/w family anticoagulation, they relate she want to move toward comfort.  Delirium: - Due to medication and infectious etiology.  Code Status: DNR Family Communication: wife  Disposition Plan: inpatient   Consultants:  None  Procedures:  CXR  Antibiotics:  Vanc and cefepime 7.24.2015  HPI/Subjective: No complains, nonverbal  Objective: Filed Vitals:   01/20/14 1354 01/20/14 2248 01/21/14 0444 01/21/14 0524  BP: 127/72   137/108  Pulse: 103   108  Temp: 97.6 F (36.4 C)   97.6 F (36.4 C)  TempSrc: Oral   Axillary  Resp: 18   18  Height:      Weight:      SpO2: 95% 93% 99% 95%    Intake/Output Summary (Last 24 hours) at 01/21/14 1201 Last data filed at 01/21/14 0717  Gross per 24 hour  Intake    100 ml  Output   1500 ml  Net  -1400 ml   Filed Weights   01/19/14 0006  Weight: 59 kg (130 lb 1.1 oz)    Exam:  General:  in no acute distress.  HEENT: No bruits, no goiter.  Heart: Regular rate and rhythm. Lungs: Good air movement, clear  Abdomen: Soft, nontender, nondistended, positive bowel sounds.    Data Reviewed: Basic Metabolic Panel:  Recent Labs Lab 01/18/14 2035 01/19/14 0500 01/20/14 0550  NA 146 146 149*  K 4.4 4.1 3.8  CL 108 110 112  CO2 23 26 21   GLUCOSE 187* 167* 141*  BUN 41* 45* 53*  CREATININE 1.60* 1.69* 1.63*  CALCIUM 10.0 9.4 9.4   Liver  Function Tests:  Recent Labs Lab 01/18/14 2035  AST 46*  ALT 26  ALKPHOS 81  BILITOT 1.9*  PROT 7.4  ALBUMIN 3.0*   No results found for this basename: LIPASE, AMYLASE,  in the last 168 hours No results found for this basename: AMMONIA,  in the last 168 hours CBC:  Recent Labs Lab 01/18/14 2035 01/19/14 0500  WBC 16.1* 13.5*  NEUTROABS 14.1*  --   HGB 10.3* 8.8*  HCT 31.1* 26.3*  MCV 102.3* 101.2*  PLT 224 177   Cardiac Enzymes: No results found for this basename: CKTOTAL, CKMB, CKMBINDEX, TROPONINI,  in the last 168 hours BNP (last 3 results) No results found for this basename: PROBNP,  in the last 8760 hours CBG:  Recent Labs Lab 01/19/14 0219 01/19/14 0758 01/19/14 1127 01/20/14 0738  GLUCAP 159* 136* 155* 156*    Recent Results (from the past 240 hour(s))  CULTURE, BLOOD (ROUTINE X 2)     Status: None   Collection Time    01/18/14  8:35 PM      Result Value Ref Range Status   Specimen Description BLOOD RIGHT FOREARM   Final   Special Requests BOTTLES DRAWN AEROBIC AND ANAEROBIC 5 ML   Final   Culture  Setup Time     Final   Value: 01/19/2014 00:46  Performed at [facility]     Final   Value:        BLOOD CULTURE RECEIVED NO GROWTH TO DATE CULTURE WILL BE HELD FOR 5 DAYS BEFORE ISSUING A FINAL NEGATIVE REPORT     Performed at [facility]   Report Status PENDING   Incomplete  CULTURE, BLOOD (ROUTINE X 2)     Status: None   Collection Time    01/18/14  8:35 PM      Result Value Ref Range Status   Specimen Description BLOOD LEFT FOREARM   Final   Special Requests BOTTLES DRAWN AEROBIC ONLY 3 ML   Final   Culture  Setup Time     Final   Value: 01/19/2014 00:45     Performed at [facility]   Culture     Final   Value:        BLOOD CULTURE RECEIVED NO GROWTH TO DATE CULTURE WILL BE HELD FOR 5 DAYS BEFORE ISSUING A FINAL NEGATIVE REPORT     Performed at [facility]   Report Status PENDING   Incomplete    URINE CULTURE     Status: None   Collection Time    01/18/14  9:24 PM      Result Value Ref Range Status   Specimen Description URINE, CATHETERIZED   Final   Special Requests NONE   Final   Culture  Setup Time     Final   Value: 01/19/2014 01:37     Performed at [place]     Final   Value: NO GROWTH     Performed at [facility]   Culture     Final   Value: NO GROWTH     Performed at [facility]   Report Status 01/19/2014 FINAL   Final     Studies: No results found.  Scheduled Meds: . antiseptic oral rinse  15 mL Mouth Rinse BID  . ARIPiprazole  5 mg Oral QHS  . ceFEPime (MAXIPIME) IV  1 g Intravenous QHS  . clonazePAM  0.5 mg Oral BID  . clonazePAM  1.5 mg Oral QHS  . enoxaparin (LOVENOX) injection  30 mg Subcutaneous Q24H  . furosemide  40 mg Oral Daily  . insulin aspart  0-15 Units Subcutaneous TID WC  . LORazepam  1 mg Intravenous Once  . losartan  50 mg Oral Daily  . potassium chloride  10 mEq Oral BID  . QUEtiapine  50 mg Oral QHS  . senna  2 tablet Oral Daily  . sertraline  50 mg Oral BID  . traMADol  50 mg Oral BID  . vancomycin  750 mg Intravenous QHS   Continuous Infusions: . sodium chloride 10 mL/hr at 01/18/14 2047     Charlynne Cousins  [facility] Pager [phone]. If 8PM-8AM, please contact night-coverage at [email or web address], password [facility] 01/21/2014, 12:01 PM  LOS: 3 days      **Disclaimer: This note may have been dictated with voice recognition software. Similar sounding words can inadvertently be transcribed and this note may contain transcription errors which may not have been corrected upon publication of note.**

## 2014-01-21 NOTE — Progress Notes (Signed)
DNR.  Related admission.  Patient in the bed awake but non-verbal.  Continues with some restlessness this am.  Private sitter present at bedside.  Chart reviewed and spoke with floor RN.  Please contact [place] with any questions/concerns or plans for d/c.  Vance Gather, RN [facility]

## 2014-01-21 NOTE — Progress Notes (Signed)
ANTIBIOTIC CONSULT NOTE - FOLLOW UP  Pharmacy Consult for Vancomycin and Cefepime Indication: HCAP  Allergies  Allergen Reactions  . Citalopram Hydrobromide Other (See Comments)    no reaction, refused to take  . Duloxetine Other (See Comments)    did not like, refused to continue    Patient Measurements: Height: 5' 2.99" (160 cm) Weight: 130 lb 1.1 oz (59 kg) IBW/kg (Calculated) : 56.88  Vital Signs: Temp: 97.6 F (36.4 C) (07/26 0524) Temp src: Axillary (07/26 0524) BP: 137/108 mmHg (07/26 0524) Pulse Rate: 108 (07/26 0524) Intake/Output from previous day: 07/25 0701 - 07/26 0700 In: 200 [P.O.:200] Out: 1000 [Urine:1000] Intake/Output from this shift: Total I/O In: -  Out: 500 [Urine:500]  Labs:  Recent Labs  01/18/14 2035 01/19/14 0500 01/20/14 0550  WBC 16.1* 13.5*  --   HGB 10.3* 8.8*  --   PLT 224 177  --   CREATININE 1.60* 1.69* 1.63*   Estimated Creatinine Clearance: 25.7 ml/min (by C-G formula based on Cr of 1.63). No results found for this basename: VANCOTROUGH, VANCOPEAK, VANCORANDOM, [place], [place], GENTRANDOM, TOBRATROUGH, TOBRAPEAK, TOBRARND, AMIKACINPEAK, AMIKACINTROU, AMIKACIN,  in the last 72 hours   Microbiology: Recent Results (from the past 720 hour(s))  CULTURE, BLOOD (ROUTINE X 2)     Status: None   Collection Time    01/18/14  8:35 PM      Result Value Ref Range Status   Specimen Description BLOOD RIGHT FOREARM   Final   Special Requests BOTTLES DRAWN AEROBIC AND ANAEROBIC 5 ML   Final   Culture  Setup Time     Final   Value: 01/19/2014 00:46     Performed at [facility]   Culture     Final   Value:        BLOOD CULTURE RECEIVED NO GROWTH TO DATE CULTURE WILL BE HELD FOR 5 DAYS BEFORE ISSUING A FINAL NEGATIVE REPORT     Performed at [facility]   Report Status PENDING   Incomplete  CULTURE, BLOOD (ROUTINE X 2)     Status: None   Collection Time    01/18/14  8:35 PM      Result Value Ref Range Status   Specimen Description BLOOD LEFT FOREARM   Final   Special Requests BOTTLES DRAWN AEROBIC ONLY 3 ML   Final   Culture  Setup Time     Final   Value: 01/19/2014 00:45     Performed at [facility]   Culture     Final   Value:        BLOOD CULTURE RECEIVED NO GROWTH TO DATE CULTURE WILL BE HELD FOR 5 DAYS BEFORE ISSUING A FINAL NEGATIVE REPORT     Performed at [facility]   Report Status PENDING   Incomplete  URINE CULTURE     Status: None   Collection Time    01/18/14  9:24 PM      Result Value Ref Range Status   Specimen Description URINE, CATHETERIZED   Final   Special Requests NONE   Final   Culture  Setup Time     Final   Value: 01/19/2014 01:37     Performed at [place]     Final   Value: NO GROWTH     Performed at [facility]   Culture     Final   Value: NO GROWTH     Performed at [facility]  Report Status 01/19/2014 FINAL   Final    Assessment: 80 y/o M resident of a memory care facility, on hospice service, was brought to ED 7/23 with confusion/agitation, fever, congestion, poor PO intake, admitted with sepsis due to bilateral HCAP. Family stated that although pt is a DNR and on hospice care they do want him to receive IV antibiotics and IV fluids. Pharmacy consulted to dose vancomycin and cefepime.  Day #4 abx 7/23 >> Rocephin/Zithro x 1 dose 7/23 >> Vancomycin >> 7/24 >> Cefepime >>  Tmax: AF now WBCs: elevated but improving Renal: SCr 1.63 (unchanged), CrCl 25CG, 32N (baseline SCr 1.1) Lactic acid: 3.04  7/23 blood x2: ngtd 7/23 urine: NGF 7/23 sputum: not collected  Dose changes/drug level info:  7/27 trough at 21:30 = ____ mcg/ml, SCr = ___   Goal of Therapy:  Vancomycin trough level 15-20 mcg/ml Cefepime dose per renal function  Plan:   Continue vancomycin 750mg  IV q24h - check trough and SCr tonight prior to 4th dose  Continue cefepime 1g IV q24h Follow up renal function & cultures,  de-escalation of therapy  Peggyann Juba, PharmD, BCPS Pager: [phone] 01/21/2014,10:34 AM

## 2014-01-22 LAB — GLUCOSE, CAPILLARY: Glucose-Capillary: 183 mg/dL — ABNORMAL HIGH (ref 70–99)

## 2014-01-22 MED ORDER — LEVOFLOXACIN 750 MG PO TABS
750.0000 mg | ORAL_TABLET | ORAL | Status: DC
  Administered 2014-01-22: 750 mg via ORAL
  Filled 2014-01-22: qty 1

## 2014-01-22 MED ORDER — METOPROLOL SUCCINATE ER 25 MG PO TB24
25.0000 mg | ORAL_TABLET | Freq: Every day | ORAL | Status: DC
  Administered 2014-01-22 – 2014-01-23 (×2): 25 mg via ORAL
  Filled 2014-01-22 (×3): qty 1

## 2014-01-22 MED ORDER — IPRATROPIUM-ALBUTEROL 0.5-2.5 (3) MG/3ML IN SOLN
3.0000 mL | Freq: Two times a day (BID) | RESPIRATORY_TRACT | Status: DC
  Administered 2014-01-22 – 2014-01-25 (×6): 3 mL via RESPIRATORY_TRACT
  Filled 2014-01-22 (×6): qty 3

## 2014-01-22 MED ORDER — LORAZEPAM 1 MG PO TABS
1.0000 mg | ORAL_TABLET | Freq: Four times a day (QID) | ORAL | Status: DC | PRN
  Administered 2014-01-22: 1 mg via ORAL
  Filled 2014-01-22: qty 1

## 2014-01-22 NOTE — Progress Notes (Addendum)
Inpatient RN visit-[name] [facility] RN Visit-Karen Alford [place] RN  Related admission to [facility] diagnosis of Alzheimer's Dz. Pt is DNR code.   Pt alert & oriented, lying in bed. Did make some eye contact, unable to understand verbalization. Wet, non productive cough noted. Pt with intermittent agitation, pulling at gown and sheets. Use of accessory muscles noted. Discussed with staff RN Shanon Brow, pt has PRN dilaudid ordered as well as PRN Duonebs. David to f/u.  CG Sybella present during visit, pt's wife spend the night and had gone home, she is due back this afternoon. Per chart review  pt's wife has decided on comfort care, IV abt have been d/cd, per Shireen Quan, [name] has been known to struggle with her decisions and was not sure she ([name]) wanted the interventions stopped. Writer contacted [place] will contact [name] and discuss her current wishes for treatment vs comfort. Monica to communicate with Probation officer regarding [name]'s decisions.  Patient's home medication list and transfer summary is on shadow chart.   * 3:20pm-4:15pm back  to revisit patient and speak with his wife. She is exhausted and wants very much to make the right decision regarding his course of treatment. Pt did take po medications during visit, staff RN Shanon Brow crushed medications and dissolved in water, pt swallowed some and spit some out. Pt remained restless through out visit, Staff RN Shanon Brow gave prn dose of haldol as ordered. Mrs. Hoeger feels that she needs to treat the PNA if her husband is eating, he did eat 25% of lunch today and 75% of breakfast, yesterday he did not eat. She again confirmed that she plans to have the pt return to [facility] when medically ready. He does not need any additional equipment. Emotional support offered.  Please call [facility] @ [phone]-  with any hospice needs.   Thank you. Tracey Harries, RN  [facility]  Hospice  Liaison  [phone])

## 2014-01-22 NOTE — Progress Notes (Signed)
Clinical Social Work  CSW spoke with hospice RN Santiago Glad) who reports that patient plans to return back to [facility] with caregivers and hospice following when medically stable. CSW will continue to follow and will assist with DC when patient is stable.  [place], [place] [phone]

## 2014-01-22 NOTE — Progress Notes (Signed)
Clinical Social Work  MD reports that patient will be treated for pneumonia prior to return back to ALF. CSW spoke with Desiree at [facility] is aware and agreeable for patient to return when medically stable. CSW will keep hospice [phone]) updated on plans as well.  [place], [place] [phone]

## 2014-01-22 NOTE — Clinical Documentation Improvement (Signed)
"  Acute Renal Injury" documented in current medical record.  Please document the clinical indicators of Acute Renal Injury in the progress notes and discharge summary.  Review of CHL -   Creatinine 10/07/13 at discharge - 1.04 mg/dL  Creatinine 01/18/14 on admission - 1.60 mg/dL  Creatinine in increased by 0.56 mg/dl since last discharge  Thank You,  Erling Conte ,RN BSN CCDS Certified Clinical Documentation Specialist:  [facility]

## 2014-01-22 NOTE — Evaluation (Signed)
SLP Cancellation Note  Patient Details Name: [name] MRN: [id number] DOB: [birth year]   Cancelled treatment:       Reason Eval/Treat Not Completed: Medical issues which prohibited therapy;Fatigue/lethargy limiting ability to participate (pt resting and spouse reports was agitated earlier today,  mild dyspnea noted at baseline, defer eval til next date)  Spouse reports pt with good swallow ability until this pna.  He has a h/o dysphagia and is familiar to this SLP - MBS conducted 08/2012 without aspiration noted and rec for dys3/thin with precautions.  Reviewed MBS results with spouse and advised caution with giving pt intake.       Luanna Salk, [facility] SLP [phone]

## 2014-01-22 NOTE — Progress Notes (Signed)
Patient [name]      DOB: 1925-05-17      WCH:[id number]  Consult received for placement.  Discussed with hospice team earlier today and reviewed notes form Dr. Wendee Beavers. Noted patient's spouse has decided to treat pneumonia.  Patient with known dysphagia.  I will contact patient's spouse and meet tomorrow to affirm goals for primary service.  Marleta Lapierre L. Lovena Le, MD MBA The Palliative Medicine Team at [facility] Phone: [phone] Pager: [phone] ( Use team phone after hours)

## 2014-01-22 NOTE — Progress Notes (Signed)
ANTIBIOTIC CONSULT NOTE - INITIAL  Pharmacy Consult for Levaquin Indication: Pneumonia  Allergies  Allergen Reactions  . Citalopram Hydrobromide Other (See Comments)    no reaction, refused to take  . Duloxetine Other (See Comments)    did not like, refused to continue    Patient Measurements: Height: 5' 2.99" (160 cm) Weight: 130 lb 1.1 oz (59 kg) IBW/kg (Calculated) : 56.88   Vital Signs: Temp: 98.3 F (36.8 C) (07/27 0643) Temp src: Axillary (07/27 0643) BP: 131/96 mmHg (07/27 0643) Pulse Rate: 109 (07/27 0643) Intake/Output from previous day: 07/26 0701 - 07/27 0700 In: -  Out: 750 [Urine:750] Intake/Output from this shift:    Labs:  Recent Labs  01/20/14 0550  CREATININE 1.63*   Estimated Creatinine Clearance: 25.7 ml/min (by C-G formula based on Cr of 1.63).    Microbiology: Recent Results (from the past 720 hour(s))  CULTURE, BLOOD (ROUTINE X 2)     Status: None   Collection Time    01/18/14  8:35 PM      Result Value Ref Range Status   Specimen Description BLOOD RIGHT FOREARM   Final   Special Requests BOTTLES DRAWN AEROBIC AND ANAEROBIC 5 ML   Final   Culture  Setup Time     Final   Value: 01/19/2014 00:46     Performed at [facility]   Culture     Final   Value:        BLOOD CULTURE RECEIVED NO GROWTH TO DATE CULTURE WILL BE HELD FOR 5 DAYS BEFORE ISSUING A FINAL NEGATIVE REPORT     Performed at [facility]   Report Status PENDING   Incomplete  CULTURE, BLOOD (ROUTINE X 2)     Status: None   Collection Time    01/18/14  8:35 PM      Result Value Ref Range Status   Specimen Description BLOOD LEFT FOREARM   Final   Special Requests BOTTLES DRAWN AEROBIC ONLY 3 ML   Final   Culture  Setup Time     Final   Value: 01/19/2014 00:45     Performed at [facility]   Culture     Final   Value:        BLOOD CULTURE RECEIVED NO GROWTH TO DATE CULTURE WILL BE HELD FOR 5 DAYS BEFORE ISSUING A FINAL NEGATIVE REPORT     Performed  at [facility]   Report Status PENDING   Incomplete  URINE CULTURE     Status: None   Collection Time    01/18/14  9:24 PM      Result Value Ref Range Status   Specimen Description URINE, CATHETERIZED   Final   Special Requests NONE   Final   Culture  Setup Time     Final   Value: 01/19/2014 01:37     Performed at [place]     Final   Value: NO GROWTH     Performed at [facility]   Culture     Final   Value: NO GROWTH     Performed at [facility]   Report Status 01/19/2014 FINAL   Final    Medical History: Past Medical History  Diagnosis Date  . Carotid artery stenosis   . Gait disturbance   . Anxiety   . GERD (gastroesophageal reflux disease)   . Vitamin B12 deficiency   . Depression   . Osteoarthritis   .  Diabetes mellitus, type 2   . Hypertension   . Vitamin D deficiency   . Tremor   . Aortic stenosis     murmur  . Hyperlipidemia   . Diverticulosis   . Adenomatous polyp of colon   . Memory loss   . Polyneuropathy in diabetes(357.2)   . Dementia   . Dysrhythmia   . BPH (benign prostatic hyperplasia)     Medications:  Scheduled:  . ipratropium-albuterol  3 mL Nebulization BID  . levofloxacin  750 mg Oral Q48H  . risperiDONE  2 mg Oral QHS  . sertraline  50 mg Oral BID   Infusions:  . sodium chloride 10 mL/hr at 01/18/14 2047   PRN: acetaminophen, haloperidol lactate, HYDROmorphone (DILAUDID) injection, ipratropium-albuterol, LORazepam  Antimicrobials: 7/23 >> Rocephin/Zithro x 1 dose 7/23 >> Vancomycin >> 7/26 7/24 >> Cefepime >> 7/26 7/27 >> Levaquin   Assessment: 80 y/o M on hospice service was brought to ED 7/23 and admitted with sepsis due to bilateral pneumonia.  Empiric IV antibiotic therapy was initiated 7/23 and administered until 7/26, when antibiotics where discontinued as part of conversion to comfort care.  Orders received this PM to resume antibiotic therapy but use Levaquin  monotherapy with pharmacy dosing assistance.  Goal of Therapy:  Appropriate antibiotic dosing for renal function; eradication of infection.   Plan:  1. Begin Levaquin 750 mg PO q48h (dosage adjusted for CrCl in 20-49 mL/min stratum) 2. Follow clinical course.  Clayburn Pert, PharmD, BCPS Pager: [phone] 01/22/2014  2:47 PM

## 2014-01-22 NOTE — Progress Notes (Signed)
Brief Narrative: Patient is an 80 year old Caucasian male who presented with increased agitation, confusion, fever. Patient has a history of dementia, GERD, DM, aortic stenosis. On further evaluation in the ED patient was found to have A. fib with RVR of which IV diltiazem was administered. Further evaluation would also reveal pneumonia of which IV antibiotics were started. He was initially considered to place patient in full comfort measures. But after discussion with wife she has changed her mind and would like this treat the pneumonia.  [facility] PROGRESS NOTE Assessment/Plan:  HCAP (healthcare-associated pneumonia)/Sepsis/Acute respiratory failure with hypoxemia: -  We'll have patient assess for aspiration given history of dementia. As such and place speech therapy consult - Levaquin per pharmacy consult - Pulmonary toilet    Acute renal injury - baseline Cr ~ 1.0. - Reassess serum creatinine next a.m. and I suspect this is secondary to prerenal causes.   New onset a.fib: -  Will try to place patient on beta blocker for rate control. Chads2-vas of 4. - Discussed anti-coagulation options with wife but given patient's inability to get his INR monitored frequently as well as his increased risk of falls currently we have both agreed not to start patient on anticoagulation medication.  Delirium: - Most likely secondary to dementia and active infectious etiology.  Code Status: DNR Family Communication: wife  Disposition Plan: inpatient, with improvement in respiratory condition may transition patient to  facility with palliative care   Consultants:  None  Procedures:  CXR  Antibiotics:  Levaquin  HPI/Subjective: Discussion with wife revealed that she would like to try and get pneumonia treated. The patient has sounded congested all day per discussion with wife.  Objective: Filed Vitals:   01/21/14 1435 01/21/14 1500 01/21/14 2229 01/22/14 0643  BP:  139/96 119/79  131/96  Pulse:  103 56 109  Temp:  98.4 F (36.9 C) 98.9 F (37.2 C) 98.3 F (36.8 C)  TempSrc:  Axillary Axillary Axillary  Resp:  18 24 24   Height:      Weight:      SpO2: 86% 90% 96% 98%    Intake/Output Summary (Last 24 hours) at 01/22/14 1500 Last data filed at 01/21/14 2215  Gross per 24 hour  Intake      0 ml  Output    250 ml  Net   -250 ml   Filed Weights   01/19/14 0006  Weight: 59 kg (130 lb 1.1 oz)    Exam:  General:  in no acute distress. Awake but does not interact with examiner HEENT: No bruits, no goiter.  Heart: S1 and S2 present, no rub Lungs: Coarse breath sounds diffusely, nasal cannula in place, increased work of breathing Abdomen: Soft, nontender, nondistended, positive bowel sounds.    Data Reviewed: Basic Metabolic Panel:  Recent Labs Lab 01/18/14 2035 01/19/14 0500 01/20/14 0550  NA 146 146 149*  K 4.4 4.1 3.8  CL 108 110 112  CO2 23 26 21   GLUCOSE 187* 167* 141*  BUN 41* 45* 53*  CREATININE 1.60* 1.69* 1.63*  CALCIUM 10.0 9.4 9.4   Liver Function Tests:  Recent Labs Lab 01/18/14 2035  AST 46*  ALT 26  ALKPHOS 81  BILITOT 1.9*  PROT 7.4  ALBUMIN 3.0*   No results found for this basename: LIPASE, AMYLASE,  in the last 168 hours No results found for this basename: AMMONIA,  in the last 168 hours CBC:  Recent Labs Lab 01/18/14 2035 01/19/14 0500  WBC 16.1* 13.5*  NEUTROABS 14.1*  --   HGB 10.3* 8.8*  HCT 31.1* 26.3*  MCV 102.3* 101.2*  PLT 224 177   Cardiac Enzymes: No results found for this basename: CKTOTAL, CKMB, CKMBINDEX, TROPONINI,  in the last 168 hours BNP (last 3 results) No results found for this basename: PROBNP,  in the last 8760 hours CBG:  Recent Labs Lab 01/19/14 0219 01/19/14 0758 01/19/14 1127 01/20/14 0738  GLUCAP 159* 136* 155* 156*    Recent Results (from the past 240 hour(s))  CULTURE, BLOOD (ROUTINE X 2)     Status: None   Collection Time    01/18/14  8:35 PM      Result Value  Ref Range Status   Specimen Description BLOOD RIGHT FOREARM   Final   Special Requests BOTTLES DRAWN AEROBIC AND ANAEROBIC 5 ML   Final   Culture  Setup Time     Final   Value: 01/19/2014 00:46     Performed at [facility]   Culture     Final   Value:        BLOOD CULTURE RECEIVED NO GROWTH TO DATE CULTURE WILL BE HELD FOR 5 DAYS BEFORE ISSUING A FINAL NEGATIVE REPORT     Performed at [facility]   Report Status PENDING   Incomplete  CULTURE, BLOOD (ROUTINE X 2)     Status: None   Collection Time    01/18/14  8:35 PM      Result Value Ref Range Status   Specimen Description BLOOD LEFT FOREARM   Final   Special Requests BOTTLES DRAWN AEROBIC ONLY 3 ML   Final   Culture  Setup Time     Final   Value: 01/19/2014 00:45     Performed at [facility]   Culture     Final   Value:        BLOOD CULTURE RECEIVED NO GROWTH TO DATE CULTURE WILL BE HELD FOR 5 DAYS BEFORE ISSUING A FINAL NEGATIVE REPORT     Performed at [facility]   Report Status PENDING   Incomplete  URINE CULTURE     Status: None   Collection Time    01/18/14  9:24 PM      Result Value Ref Range Status   Specimen Description URINE, CATHETERIZED   Final   Special Requests NONE   Final   Culture  Setup Time     Final   Value: 01/19/2014 01:37     Performed at [place]     Final   Value: NO GROWTH     Performed at [facility]   Culture     Final   Value: NO GROWTH     Performed at [facility]   Report Status 01/19/2014 FINAL   Final     Studies: No results found.  Scheduled Meds: . ipratropium-albuterol  3 mL Nebulization BID  . levofloxacin  750 mg Oral Q48H  . risperiDONE  2 mg Oral QHS  . sertraline  50 mg Oral BID   Continuous Infusions: . sodium chloride 10 mL/hr at 01/18/14 2047     Lum Stillinger, [place] [facility] Pager [phone] If 8PM-8AM, please contact night-coverage at [email or web address], password [facility] 01/22/2014, 3:00 PM   LOS: 4 days      **Disclaimer: This note may have been dictated with voice recognition software. Similar sounding words can inadvertently be transcribed and this note may contain transcription errors which  may not have been corrected upon publication of note.**

## 2014-01-23 DIAGNOSIS — I4891 Unspecified atrial fibrillation: Secondary | ICD-10-CM

## 2014-01-23 LAB — GLUCOSE, CAPILLARY
GLUCOSE-CAPILLARY: 126 mg/dL — AB (ref 70–99)
GLUCOSE-CAPILLARY: 185 mg/dL — AB (ref 70–99)
GLUCOSE-CAPILLARY: 65 mg/dL — AB (ref 70–99)
GLUCOSE-CAPILLARY: 141 mg/dL — AB (ref 70–99)
GLUCOSE-CAPILLARY: 239 mg/dL — AB (ref 70–99)
GLUCOSE-CAPILLARY: 211 mg/dL — AB (ref 70–99)
GLUCOSE-CAPILLARY: 155 mg/dL — AB (ref 70–99)
GLUCOSE-CAPILLARY: 231 mg/dL — AB (ref 70–99)
GLUCOSE-CAPILLARY: 193 mg/dL — AB (ref 70–99)
GLUCOSE-CAPILLARY: 165 mg/dL — AB (ref 70–99)
Glucose-Capillary: 187 mg/dL — ABNORMAL HIGH (ref 70–99)
Glucose-Capillary: 136 mg/dL — ABNORMAL HIGH (ref 70–99)
Glucose-Capillary: 158 mg/dL — ABNORMAL HIGH (ref 70–99)
Glucose-Capillary: 200 mg/dL — ABNORMAL HIGH (ref 70–99)
Glucose-Capillary: 205 mg/dL — ABNORMAL HIGH (ref 70–99)
Glucose-Capillary: 135 mg/dL — ABNORMAL HIGH (ref 70–99)
Glucose-Capillary: 140 mg/dL — ABNORMAL HIGH (ref 70–99)

## 2014-01-23 LAB — BASIC METABOLIC PANEL
Anion gap: 14 (ref 5–15)
BUN: 48 mg/dL — AB (ref 6–23)
CALCIUM: 9.6 mg/dL (ref 8.4–10.5)
CO2: 22 mEq/L (ref 19–32)
Chloride: 123 mEq/L — ABNORMAL HIGH (ref 96–112)
Creatinine, Ser: 1.68 mg/dL — ABNORMAL HIGH (ref 0.50–1.35)
GFR calc non Af Amer: 35 mL/min — ABNORMAL LOW (ref >90)
GFR, EST AFRICAN AMERICAN: 41 mL/min — AB (ref >90)
GLUCOSE: 174 mg/dL — AB (ref 70–99)
Potassium: 3.8 mEq/L (ref 3.7–5.3)
Sodium: 159 mEq/L — ABNORMAL HIGH (ref 137–147)

## 2014-01-23 LAB — CBC
HCT: 29.3 % — ABNORMAL LOW (ref 39.0–52.0)
Hemoglobin: 9.4 g/dL — ABNORMAL LOW (ref 13.0–17.0)
MCH: 33.1 pg (ref 26.0–34.0)
MCHC: 32.1 g/dL (ref 30.0–36.0)
MCV: 103.2 fL — AB (ref 78.0–100.0)
Platelets: 285 10*3/uL (ref 150–400)
RBC: 2.84 MIL/uL — AB (ref 4.22–5.81)
RDW: 16.8 % — ABNORMAL HIGH (ref 11.5–15.5)
WBC: 13.3 10*3/uL — ABNORMAL HIGH (ref 4.0–10.5)

## 2014-01-23 MED ORDER — ACETAMINOPHEN 650 MG RE SUPP
RECTAL | Status: AC
  Administered 2014-01-23: 650 mg
  Filled 2014-01-23: qty 1

## 2014-01-23 MED ORDER — HALOPERIDOL LACTATE 5 MG/ML IJ SOLN
2.0000 mg | Freq: Four times a day (QID) | INTRAMUSCULAR | Status: DC | PRN
  Administered 2014-01-23 (×2): 2 mg via INTRAVENOUS
  Filled 2014-01-23 (×2): qty 1

## 2014-01-23 MED ORDER — SODIUM CHLORIDE 0.9 % IV SOLN
1.5000 g | Freq: Two times a day (BID) | INTRAVENOUS | Status: DC
  Administered 2014-01-23 – 2014-01-24 (×2): 1.5 g via INTRAVENOUS
  Filled 2014-01-23 (×2): qty 1.5

## 2014-01-23 NOTE — Evaluation (Addendum)
Clinical/Bedside Swallow Evaluation Patient Details  Name: [name] MRN: [id number] Date of Birth: June 23, 1925  Today's Date: 01/23/2014 Time: 0952-1010 SLP Time Calculation (min): 18 min  Past Medical History:  Past Medical History  Diagnosis Date  . Carotid artery stenosis   . Gait disturbance   . Anxiety   . GERD (gastroesophageal reflux disease)   . Vitamin B12 deficiency   . Depression   . Osteoarthritis   . Diabetes mellitus, type 2   . Hypertension   . Vitamin D deficiency   . Tremor   . Aortic stenosis     murmur  . Hyperlipidemia   . Diverticulosis   . Adenomatous polyp of colon   . Memory loss   . Polyneuropathy in diabetes(357.2)   . Dementia   . Dysrhythmia   . BPH (benign prostatic hyperplasia)    Past Surgical History:  Past Surgical History  Procedure Laterality Date  . Vasectomy    . Lumbar laminectomy      L5 Dr Lorin [facility]  . Transurethral resection of prostate  March 2007  . Cataract extraction, bilateral Bilateral   . Tonsillectomy and adenoidectomy    . Left foot      fracture of left foot   HPI:  /80 yo male adm to [facility] with AMS, cough.  CXR revealed right more than left airspace disease concerning for pna.  Pt has h/o dysphagia per MBS 08/2012 - moderate oropharyngeal and cervical dysphagia and was deemed to be a chronic aspiration risk.  It was recommended for pt to have dys3/thin diet with strict precautions including following solids with drinks and conducting dry swallows.  Pt unable to follow commands at this time and is dysarthric.  BSE ordered.    Assessment / Plan / Recommendation Clinical Impression  When SLP arrived to room, pt was being fed jello by his sitter.  Pt was lethargic and did swing his arm at SLP when respositioned in bed with RN.  SLP set up oral suction and removed copious amounts of jello pt did not swallow.  Congested breathing quality noted at baseline without ability to clear, suspected aspiration of secretions.  Pt is  grossly weak and will not be able to protect his airway adequately with aspiration - suspect aspiration prior to admit given diagnosis of premorbid dysphagia.   Sitter present reports she has cared for pt for approx six months and he eats well prior to admission.  SLP administered only a tsp of jello and used dry spoon pressure on tongue to attempt to initiate swallow after pt demonstrated severe oral holding.    Pt did allow oral suction and administration of po but due his mentation/dysphagia/Alz disease, poor awareness and no oral transiting, he is a High aspiration risk with any po.  Advised sitter NOT TO FEED PT and wait for goals of care to be established by spouse and palliative team.  Sitter verbalized understanding to clinical reasoning and aspiration concern.  Pt also does indicate desire for food/drink (via gestures or verbalizing) per sitter answering SLP direct question cue.  For this pt at this time being npo with oral care may be most comforting due to risk of aspiration/dysphagia.  Education to comfort intake if family plans this route is indicated due to concern for decreased understanding of discomfort associated with aspiration.      Aspiration Risk  Severe    Diet Recommendation NPO        Other  Recommendations Recommended Consults:  (do  not recommend mbs, will not change outcome)   Follow Up Recommendations       Frequency and Duration min 1 x/week  1 week   Pertinent Vitals/Pain Afebrile, decreased     Swallow Study Prior Functional Status   see [place] Date of Onset: 01/23/14 HPI: /80 yo male adm to [facility] with AMS, cough.  CXR revealed right more than left airspace disease concerning for pna.  Pt has h/o dysphagia per MBS 08/2012 - moderate oropharyngeal and cervical dysphagia and was deemed to be a chronic aspiration risk.  It was recommended for pt to have dys3/thin diet with strict precautions including following solids with drinks and conducting dry  swallows.  Pt unable to follow commands at this time and is dysarthric.  BSE ordered.  Type of Study: Bedside swallow evaluation Diet Prior to this Study: Regular;Thin liquids Temperature Spikes Noted: No Respiratory Status: Nasal cannula History of Recent Intubation: No Behavior/Cognition: Confused;Agitated;Lethargic;Doesn't follow directions;Decreased sustained attention Oral Cavity - Dentition:  (dentition present) Self-Feeding Abilities: Total assist Patient Positioning: Upright in bed Baseline Vocal Quality: Other (comment) (pt without phonation) Volitional Cough: Cognitively unable to elicit Volitional Swallow: Unable to elicit    Oral/Motor/Sensory Function Overall Oral Motor/Sensory Function: Impaired at baseline (pt did not follow directions remained lethargic during evaluation)   Ice Chips Ice chips: Not tested   Thin Liquid Thin Liquid: Not tested    Nectar Thick Nectar Thick Liquid: Impaired Presentation: Spoon Oral Phase Impairments: Impaired anterior to posterior transit;Reduced lingual movement/coordination;Poor awareness of bolus;Reduced labial seal Oral phase functional implications: Oral holding   Honey Thick Honey Thick Liquid: Not tested   Puree Puree: Impaired Presentation: Spoon Oral Phase Impairments: Reduced labial seal;Reduced lingual movement/coordination;Impaired anterior to posterior transit;Poor awareness of bolus Oral Phase Functional Implications: Prolonged oral transit;Oral holding   Solid   GO    Solid: Not tested       Claudie Fisherman, [facility] SLP [phone]

## 2014-01-23 NOTE — Progress Notes (Addendum)
Brief Narrative: Patient is an 80 year old Caucasian male who presented with increased agitation, confusion, fever. Patient has a history of dementia, GERD, DM, aortic stenosis. On further evaluation in the ED patient was found to have A. fib with RVR of which IV diltiazem was administered. Further evaluation would also reveal pneumonia of which IV antibiotics were started. He was initially considered to place patient in full comfort measures. But after discussion with wife she has changed her mind and would like this treat the pneumonia.  [facility] PROGRESS NOTE Assessment/Plan:  HCAP (healthcare-associated pneumonia)/Sepsis/Acute respiratory failure with hypoxemia: -  patient assessed for aspiration given history of dementia. SLP states he had an eval in 3/14 and was recommended thin liquids at that time- he is currently too sedated to participate with eval - change Levaquin to Unasyn pharmacy consult- low grade fever currently - Pulmonary toilet - will order home O2 for him    Acute renal injury - baseline Cr ~ 1.0. - BUN /Cr ratio rising - will start IVF today as PO intake is poor   New onset a.fib: -  cont  beta blocker for rate control - was able to take pills this AM - Chads2-vas of 4. - Dr Wendee Beavers discussed anti-coagulation options with wife but given patient's inability to get his INR monitored frequently as well as his increased risk of falls currently we have both agreed not to start patient on anticoagulation medication.  Delirium with underlying dementia - per caretaker he is close to his baseline  Code Status: DNR Family Communication: wife  Disposition Plan: inpatient, with improvement in respiratory condition may transition patient to  facility with palliative care   Consultants:  None  Procedures:  CXR  Antibiotics:  Azithromycin 7/23  Cefepime 7/24  Vancomycin 7/25  Levaquin 7/26- 7/28  HPI/Subjective: Discussion with wife revealed that she  would like to try and get pneumonia treated. The patient has sounded congested all day per discussion with wife.  Objective: Filed Vitals:   01/23/14 0619 01/23/14 0805 01/23/14 1353 01/23/14 1430  BP: 123/69  135/75   Pulse: 102  100   Temp: 98.4 F (36.9 C)  100.7 F (38.2 C) 99.8 F (37.7 C)  TempSrc: Axillary  Axillary Axillary  Resp: 20  20   Height:      Weight:      SpO2: 96% 91% 92%     Intake/Output Summary (Last 24 hours) at 01/23/14 1552 Last data filed at 01/23/14 0620  Gross per 24 hour  Intake      0 ml  Output   1100 ml  Net  -1100 ml   Filed Weights   01/19/14 0006  Weight: 59 kg (130 lb 1.1 oz)    Exam:  General:  in no acute distress. Awake but does not interact with examiner HEENT: No bruits, no goiter.  Heart: S1 and S2 present, no rub Lungs: Coarse breath sounds diffusely, nasal cannula in place, increased work of breathing Abdomen: Soft, nontender, nondistended, positive bowel sounds.    Data Reviewed: Basic Metabolic Panel:  Recent Labs Lab 01/18/14 2035 01/19/14 0500 01/20/14 0550 01/23/14 0012  NA 146 146 149* 159*  K 4.4 4.1 3.8 3.8  CL 108 110 112 123*  CO2 23 26 21 22   GLUCOSE 187* 167* 141* 174*  BUN 41* 45* 53* 48*  CREATININE 1.60* 1.69* 1.63* 1.68*  CALCIUM 10.0 9.4 9.4 9.6   Liver Function Tests:  Recent Labs Lab 01/18/14 2035  AST 46*  ALT 26  ALKPHOS 81  BILITOT 1.9*  PROT 7.4  ALBUMIN 3.0*   No results found for this basename: LIPASE, AMYLASE,  in the last 168 hours No results found for this basename: AMMONIA,  in the last 168 hours CBC:  Recent Labs Lab 01/18/14 2035 01/19/14 0500 01/23/14 0012  WBC 16.1* 13.5* 13.3*  NEUTROABS 14.1*  --   --   HGB 10.3* 8.8* 9.4*  HCT 31.1* 26.3* 29.3*  MCV 102.3* 101.2* 103.2*  PLT 224 177 285   Cardiac Enzymes: No results found for this basename: CKTOTAL, CKMB, CKMBINDEX, TROPONINI,  in the last 168 hours BNP (last 3 results) No results found for this  basename: PROBNP,  in the last 8760 hours CBG:  Recent Labs Lab 01/22/14 1145 01/22/14 1652 01/22/14 2113 01/23/14 0909 01/23/14 1216  GLUCAP 231* 193* 183* 187* 165*    Recent Results (from the past 240 hour(s))  CULTURE, BLOOD (ROUTINE X 2)     Status: None   Collection Time    01/18/14  8:35 PM      Result Value Ref Range Status   Specimen Description BLOOD RIGHT FOREARM   Final   Special Requests BOTTLES DRAWN AEROBIC AND ANAEROBIC 5 ML   Final   Culture  Setup Time     Final   Value: 01/19/2014 00:46     Performed at [facility]   Culture     Final   Value:        BLOOD CULTURE RECEIVED NO GROWTH TO DATE CULTURE WILL BE HELD FOR 5 DAYS BEFORE ISSUING A FINAL NEGATIVE REPORT     Performed at [facility]   Report Status PENDING   Incomplete  CULTURE, BLOOD (ROUTINE X 2)     Status: None   Collection Time    01/18/14  8:35 PM      Result Value Ref Range Status   Specimen Description BLOOD LEFT FOREARM   Final   Special Requests BOTTLES DRAWN AEROBIC ONLY 3 ML   Final   Culture  Setup Time     Final   Value: 01/19/2014 00:45     Performed at [facility]   Culture     Final   Value:        BLOOD CULTURE RECEIVED NO GROWTH TO DATE CULTURE WILL BE HELD FOR 5 DAYS BEFORE ISSUING A FINAL NEGATIVE REPORT     Performed at [facility]   Report Status PENDING   Incomplete  URINE CULTURE     Status: None   Collection Time    01/18/14  9:24 PM      Result Value Ref Range Status   Specimen Description URINE, CATHETERIZED   Final   Special Requests NONE   Final   Culture  Setup Time     Final   Value: 01/19/2014 01:37     Performed at [place]     Final   Value: NO GROWTH     Performed at [facility]   Culture     Final   Value: NO GROWTH     Performed at [facility]   Report Status 01/19/2014 FINAL   Final     Studies: No results found.  Scheduled Meds: . ipratropium-albuterol  3 mL  Nebulization BID  . levofloxacin  750 mg Oral Q48H  . metoprolol succinate  25 mg Oral Daily  . risperiDONE  2 mg Oral  QHS  . sertraline  50 mg Oral BID   Continuous Infusions: . sodium chloride 10 mL/hr at 01/18/14 2047     [facility], MD  [facility] [email or web address], password [facility] 01/23/2014, 3:52 PM  LOS: 5 days      **Disclaimer: This note may have been dictated with voice recognition software. Similar sounding words can inadvertently be transcribed and this note may contain transcription errors which may not have been corrected upon publication of note.**

## 2014-01-23 NOTE — Progress Notes (Signed)
Patient NT:[name]      DOB: 1924-11-07      VQM:[id number]  Stopped this am to talk with the patient's spouse.  She had gone home after staying the night.  Care giver relates she will return at 2pm.  Will try to coordinate at time to meet.   Niccolas Loeper L. Lovena Le, MD MBA The Palliative Medicine Team at [facility] Phone: [phone] Pager: [phone] ( Use team phone after hours)

## 2014-01-23 NOTE — Progress Notes (Signed)
Inpatient RN visit- [place] Room 1516-[facility] RN Visit-Karen Alford Highland RN  Related admission to [facility] diagnosis of Alzheimer's dz.   Pt is a DNR code.     Pt seen at bedside, wife present during visit. [place] also present for part of the visit. Per chart review pt did very poorly with swallow eval today, with recommendations not to feed, due to poor swallow and high aspiration risk. Discussed with Mrs.Urbieta, as did Furniture conservator/restorer, she voiced understanding but still feels very torn about what to do. Pt appears pale, restless and did not attempt to communicate during visit. He now has a low grade fever. PMT, Dr. Lovena Le, to meet with Mrs. Putzier tomorrow am. She wants very much to do the right thing, Probation officer discussed the meaning of comfort care, again she voiced understanding, but is uncertain what this means for Mr. Bessinger. Plan remains for him to return to [facility], he will need transport.  Mrs. Dogan has requested that no information be discussed with pt care giver Sybella.  Patient's home medication list, transfer summary and OOF DNR in place on shadow chart.   Please call [facility] @ [phone]-with any hospice needs.   Thank you. Tracey Harries, RN  [facility]  Hospice Liaison  [phone])

## 2014-01-23 NOTE — Discharge Summary (Signed)
See d/c summary from 7/28

## 2014-01-23 NOTE — Discharge Summary (Signed)
Physician Discharge Summary  [name] KDX:[id number] DOB: 10-09-1924 DOA: 01/18/2014  PCP: Walker Kehr, MD  Admit date: 01/18/2014 Discharge date: 01/23/2014  Time spent: >45 minutes  This is a preliminary d/c Summary- medications not yet completed.   Recommendations for Outpatient Follow-up:  1. Evaluate need for O2 after resolution of Pneumonia  Discharge Diagnoses:  Principal Problem:   HCAP (healthcare-associated pneumonia) Active Problems:   Acute respiratory failure with hypoxemia   Dehydration   Acute renal injury   Sepsis   Discharge Condition: stable  Diet recommendation: as tolerated  Filed Weights   01/19/14 0006  Weight: 59 kg (130 lb 1.1 oz)    History of present illness:  Patient is an 80 year old Caucasian male who presented with increased agitation, confusion, fever. Patient has a history of dementia, GERD, DM, aortic stenosis. On further evaluation in the ED patient was found to have A. fib with RVR of which IV diltiazem was administered. Further evaluation would also reveal pneumonia of which IV antibiotics were started. He was initially considered to place patient in full comfort measures. But after discussion with wife she has changed her mind and would like this treat the pneumonia.   Hospital Course:  HCAP (healthcare-associated pneumonia)/Sepsis/Acute respiratory failure with hypoxemia:  - patient assessed for aspiration given history of dementia. SLP states he had an eval in 3/14 and was recommended thin liquids at that time- he is currently too sedated to participate with eval  - change Levaquin to Unasyn pharmacy consult - switch to Augmentin on Discharge - Pulmonary toilet  - will order home O2 for him   Acute renal injury  - baseline Cr ~ 1.0.  - BUN /Cr ratio rising and therefore IVF were initiated  New onset a.fib:  - cont beta blocker for rate control - was able to take pills this AM  - Chads2-vas of 4.  - Dr Wendee Beavers discussed  anti-coagulation options with wife but given patient's inability to get his INR monitored frequently as well as his increased risk of falls currently we have both agreed not to start patient on anticoagulation medication.   Delirium with underlying dementia  - per caretaker he is close to his baseline      Procedures:  none  Consultations:  none  Discharge Exam: Filed Vitals:   01/23/14 1430  BP:   Pulse:   Temp: 99.8 F (37.7 C)  Resp:     General: confused, restless Cardiovascular: IIRR, no murmurs Respiratory: mild rhonchi  Discharge Instructions You were cared for by a hospitalist during your hospital stay. If you have any questions about your discharge medications or the care you received while you were in the hospital after you are discharged, you can call the unit and asked to speak with the hospitalist on call if the hospitalist that took care of you is not available. Once you are discharged, your primary care physician will handle any further medical issues. Please note that NO REFILLS for any discharge medications will be authorized once you are discharged, as it is imperative that you return to your primary care physician (or establish a relationship with a primary care physician if you do not have one) for your aftercare needs so that they can reassess your need for medications and monitor your lab values.    Allergies  Allergen Reactions  . Citalopram Hydrobromide Other (See Comments)    no reaction, refused to take  . Duloxetine Other (See Comments)    did not like,  refused to continue      The results of significant diagnostics from this hospitalization (including imaging, microbiology, ancillary and laboratory) are listed below for reference.    Significant Diagnostic Studies: Dg Chest Port 1 View  01/18/2014   CLINICAL DATA:  Shortness of breath and weakness.  EXAM: PORTABLE CHEST - 1 VIEW  COMPARISON:  Single view of the chest 06/20/2013.  FINDINGS:  There is right much worse than left airspace disease. Cardiomegaly is identified. No pneumothorax or pleural effusion.  IMPRESSION: Right much worse than left airspace disease is most worrisome for pneumonia rather than asymmetric edema.   Electronically Signed   By: Inge Rise M.D.   On: 01/18/2014 20:29    Microbiology: Recent Results (from the past 240 hour(s))  CULTURE, BLOOD (ROUTINE X 2)     Status: None   Collection Time    01/18/14  8:35 PM      Result Value Ref Range Status   Specimen Description BLOOD RIGHT FOREARM   Final   Special Requests BOTTLES DRAWN AEROBIC AND ANAEROBIC 5 ML   Final   Culture  Setup Time     Final   Value: 01/19/2014 00:46     Performed at [facility]   Culture     Final   Value:        BLOOD CULTURE RECEIVED NO GROWTH TO DATE CULTURE WILL BE HELD FOR 5 DAYS BEFORE ISSUING A FINAL NEGATIVE REPORT     Performed at [facility]   Report Status PENDING   Incomplete  CULTURE, BLOOD (ROUTINE X 2)     Status: None   Collection Time    01/18/14  8:35 PM      Result Value Ref Range Status   Specimen Description BLOOD LEFT FOREARM   Final   Special Requests BOTTLES DRAWN AEROBIC ONLY 3 ML   Final   Culture  Setup Time     Final   Value: 01/19/2014 00:45     Performed at [facility]   Culture     Final   Value:        BLOOD CULTURE RECEIVED NO GROWTH TO DATE CULTURE WILL BE HELD FOR 5 DAYS BEFORE ISSUING A FINAL NEGATIVE REPORT     Performed at [facility]   Report Status PENDING   Incomplete  URINE CULTURE     Status: None   Collection Time    01/18/14  9:24 PM      Result Value Ref Range Status   Specimen Description URINE, CATHETERIZED   Final   Special Requests NONE   Final   Culture  Setup Time     Final   Value: 01/19/2014 01:37     Performed at [place]     Final   Value: NO GROWTH     Performed at [facility]   Culture     Final   Value: NO GROWTH     Performed at  [facility]   Report Status 01/19/2014 FINAL   Final     Labs: Basic Metabolic Panel:  Recent Labs Lab 01/18/14 2035 01/19/14 0500 01/20/14 0550 01/23/14 0012  NA 146 146 149* 159*  K 4.4 4.1 3.8 3.8  CL 108 110 112 123*  CO2 23 26 21 22   GLUCOSE 187* 167* 141* 174*  BUN 41* 45* 53* 48*  CREATININE 1.60* 1.69* 1.63* 1.68*  CALCIUM 10.0 9.4 9.4 9.6  Liver Function Tests:  Recent Labs Lab 01/18/14 2035  AST 46*  ALT 26  ALKPHOS 81  BILITOT 1.9*  PROT 7.4  ALBUMIN 3.0*   No results found for this basename: LIPASE, AMYLASE,  in the last 168 hours No results found for this basename: AMMONIA,  in the last 168 hours CBC:  Recent Labs Lab 01/18/14 2035 01/19/14 0500 01/23/14 0012  WBC 16.1* 13.5* 13.3*  NEUTROABS 14.1*  --   --   HGB 10.3* 8.8* 9.4*  HCT 31.1* 26.3* 29.3*  MCV 102.3* 101.2* 103.2*  PLT 224 177 285   Cardiac Enzymes: No results found for this basename: CKTOTAL, CKMB, CKMBINDEX, TROPONINI,  in the last 168 hours BNP: BNP (last 3 results) No results found for this basename: PROBNP,  in the last 8760 hours CBG:  Recent Labs Lab 01/22/14 1145 01/22/14 1652 01/22/14 2113 01/23/14 0909 01/23/14 1216  GLUCAP 231* 193* 183* 187* 165*       Signed:  [place]  [facility] 01/23/2014, 4:03 PM

## 2014-01-23 NOTE — Progress Notes (Signed)
ANTIBIOTIC CONSULT NOTE - INITIAL  Pharmacy Consult for Unasyn Indication: Pneumonia  Allergies  Allergen Reactions  . Citalopram Hydrobromide Other (See Comments)    no reaction, refused to take  . Duloxetine Other (See Comments)    did not like, refused to continue    Patient Measurements: Height: 5' 2.99" (160 cm) Weight: 130 lb 1.1 oz (59 kg) IBW/kg (Calculated) : 56.88   Vital Signs: Temp: 99.8 F (37.7 C) (07/28 1430) Temp src: Axillary (07/28 1430) BP: 135/75 mmHg (07/28 1353) Pulse Rate: 100 (07/28 1353) Intake/Output from previous day: 07/27 0701 - 07/28 0700 In: -  Out: 1100 [Urine:1100] Intake/Output from this shift:    Labs:  Recent Labs  01/23/14 0012  WBC 13.3*  HGB 9.4*  PLT 285  CREATININE 1.68*   Estimated Creatinine Clearance: 24.9 ml/min (by C-G formula based on Cr of 1.68).    Microbiology: Recent Results (from the past 720 hour(s))  CULTURE, BLOOD (ROUTINE X 2)     Status: None   Collection Time    01/18/14  8:35 PM      Result Value Ref Range Status   Specimen Description BLOOD RIGHT FOREARM   Final   Special Requests BOTTLES DRAWN AEROBIC AND ANAEROBIC 5 ML   Final   Culture  Setup Time     Final   Value: 01/19/2014 00:46     Performed at [facility]   Culture     Final   Value:        BLOOD CULTURE RECEIVED NO GROWTH TO DATE CULTURE WILL BE HELD FOR 5 DAYS BEFORE ISSUING A FINAL NEGATIVE REPORT     Performed at [facility]   Report Status PENDING   Incomplete  CULTURE, BLOOD (ROUTINE X 2)     Status: None   Collection Time    01/18/14  8:35 PM      Result Value Ref Range Status   Specimen Description BLOOD LEFT FOREARM   Final   Special Requests BOTTLES DRAWN AEROBIC ONLY 3 ML   Final   Culture  Setup Time     Final   Value: 01/19/2014 00:45     Performed at [facility]   Culture     Final   Value:        BLOOD CULTURE RECEIVED NO GROWTH TO DATE CULTURE WILL BE HELD FOR 5 DAYS BEFORE ISSUING A  FINAL NEGATIVE REPORT     Performed at [facility]   Report Status PENDING   Incomplete  URINE CULTURE     Status: None   Collection Time    01/18/14  9:24 PM      Result Value Ref Range Status   Specimen Description URINE, CATHETERIZED   Final   Special Requests NONE   Final   Culture  Setup Time     Final   Value: 01/19/2014 01:37     Performed at [place]     Final   Value: NO GROWTH     Performed at [facility]   Culture     Final   Value: NO GROWTH     Performed at [facility]   Report Status 01/19/2014 FINAL   Final    Medical History: Past Medical History  Diagnosis Date  . Carotid artery stenosis   . Gait disturbance   . Anxiety   . GERD (gastroesophageal reflux disease)   . Vitamin B12 deficiency   .  Depression   . Osteoarthritis   . Diabetes mellitus, type 2   . Hypertension   . Vitamin D deficiency   . Tremor   . Aortic stenosis     murmur  . Hyperlipidemia   . Diverticulosis   . Adenomatous polyp of colon   . Memory loss   . Polyneuropathy in diabetes(357.2)   . Dementia   . Dysrhythmia   . BPH (benign prostatic hyperplasia)     Medications:  Scheduled:  . ipratropium-albuterol  3 mL Nebulization BID  . metoprolol succinate  25 mg Oral Daily  . risperiDONE  2 mg Oral QHS  . sertraline  50 mg Oral BID   Infusions:  . sodium chloride 10 mL/hr at 01/18/14 2047   PRN: acetaminophen, haloperidol lactate, HYDROmorphone (DILAUDID) injection, ipratropium-albuterol, LORazepam  Antimicrobials: 7/23 >> Rocephin/Zithro x 1 dose 7/23 >> Vancomycin >> 7/26 7/24 >> Cefepime >> 7/26 7/27 >> Levaquin >> 7/28 7/28 >> Unasyn >>   Assessment: 80 y/o M on hospice service was brought to ED 7/23 and admitted with sepsis due to bilateral pneumonia.  Empiric IV antibiotic therapy was initiated 7/23 and administered until 7/26, when antibiotics where discontinued as part of conversion to comfort care. On 7/27  orders received to resume antibiotic therapy but use Levaquin monotherapy. On 7/28 MD changed Levaquin to IV Unasyn with pharmacy dosing assistance.  Goal of Therapy:  Appropriate antibiotic dosing for renal function; eradication of infection.   Plan:  1. Levaquin d/c'ed.  Begin Unasyn 1.5gm IV q12h (dosage adjusted for CrCl in 10-30 mL/min stratum) 2. Follow clinical course.  Leone Haven, PharmD  01/23/2014  4:12 PM

## 2014-01-24 DIAGNOSIS — N179 Acute kidney failure, unspecified: Secondary | ICD-10-CM

## 2014-01-24 DIAGNOSIS — J9600 Acute respiratory failure, unspecified whether with hypoxia or hypercapnia: Secondary | ICD-10-CM

## 2014-01-24 DIAGNOSIS — IMO0002 Reserved for concepts with insufficient information to code with codable children: Secondary | ICD-10-CM

## 2014-01-24 DIAGNOSIS — Z515 Encounter for palliative care: Secondary | ICD-10-CM

## 2014-01-24 DIAGNOSIS — E870 Hyperosmolality and hypernatremia: Secondary | ICD-10-CM

## 2014-01-24 DIAGNOSIS — F0391 Unspecified dementia with behavioral disturbance: Secondary | ICD-10-CM

## 2014-01-24 DIAGNOSIS — J189 Pneumonia, unspecified organism: Secondary | ICD-10-CM

## 2014-01-24 DIAGNOSIS — F03918 Unspecified dementia, unspecified severity, with other behavioral disturbance: Secondary | ICD-10-CM

## 2014-01-24 DIAGNOSIS — A419 Sepsis, unspecified organism: Principal | ICD-10-CM

## 2014-01-24 LAB — GLUCOSE, CAPILLARY
GLUCOSE-CAPILLARY: 142 mg/dL — AB (ref 70–99)
GLUCOSE-CAPILLARY: 138 mg/dL — AB (ref 70–99)
Glucose-Capillary: 129 mg/dL — ABNORMAL HIGH (ref 70–99)

## 2014-01-24 MED ORDER — LORAZEPAM 2 MG/ML IJ SOLN
1.0000 mg | Freq: Four times a day (QID) | INTRAMUSCULAR | Status: DC | PRN
  Administered 2014-01-24: 1 mg via INTRAVENOUS
  Filled 2014-01-24: qty 1

## 2014-01-24 MED ORDER — HYDROMORPHONE HCL PF 1 MG/ML IJ SOLN
1.0000 mg | INTRAMUSCULAR | Status: DC | PRN
  Administered 2014-01-25: 1 mg via INTRAVENOUS
  Filled 2014-01-24 (×2): qty 1

## 2014-01-24 MED ORDER — ACETAMINOPHEN 650 MG RE SUPP: 650.0000 mg | Freq: Four times a day (QID) | RECTAL | Status: DC | PRN

## 2014-01-24 MED ORDER — HALOPERIDOL LACTATE 5 MG/ML IJ SOLN
2.0000 mg | Freq: Four times a day (QID) | INTRAMUSCULAR | Status: DC
  Administered 2014-01-24 – 2014-01-25 (×4): 2 mg via INTRAVENOUS
  Filled 2014-01-24 (×2): qty 0.4
  Filled 2014-01-24 (×2): qty 1
  Filled 2014-01-24: qty 0.4
  Filled 2014-01-24 (×2): qty 1
  Filled 2014-01-24: qty 0.4
  Filled 2014-01-24: qty 1

## 2014-01-24 MED ORDER — DEXTROSE 5 % IV SOLN
INTRAVENOUS | Status: DC
  Filled 2014-01-24: qty 1000

## 2014-01-24 MED ORDER — HALOPERIDOL LACTATE 5 MG/ML IJ SOLN
2.0000 mg | INTRAMUSCULAR | Status: DC | PRN
  Administered 2014-01-24 (×2): 2 mg via INTRAVENOUS
  Filled 2014-01-24 (×2): qty 1

## 2014-01-24 MED ORDER — HYDROMORPHONE HCL PF 1 MG/ML IJ SOLN
0.5000 mg | INTRAMUSCULAR | Status: DC
  Administered 2014-01-24 – 2014-01-25 (×6): 0.5 mg via INTRAVENOUS
  Filled 2014-01-24 (×6): qty 1

## 2014-01-24 MED ORDER — ATROPINE SULFATE 1 % OP SOLN
4.0000 [drp] | OPHTHALMIC | Status: DC | PRN
  Administered 2014-01-24: 4 [drp] via SUBLINGUAL
  Filled 2014-01-24: qty 2

## 2014-01-24 MED ORDER — SCOPOLAMINE 1 MG/3DAYS TD PT72
1.0000 | MEDICATED_PATCH | TRANSDERMAL | Status: DC
  Administered 2014-01-24: 1.5 mg via TRANSDERMAL
  Filled 2014-01-24: qty 1

## 2014-01-24 MED ORDER — LORAZEPAM 2 MG/ML IJ SOLN
1.0000 mg | Freq: Four times a day (QID) | INTRAMUSCULAR | Status: DC
  Administered 2014-01-24 – 2014-01-25 (×5): 1 mg via INTRAVENOUS
  Filled 2014-01-24 (×5): qty 1

## 2014-01-24 NOTE — Progress Notes (Signed)
Clinical Social Work  CSW spoke with Hospice liaison Santiago Glad) who reports that she is aware of family's decision for [employer] and has already contacted patient's hospice [facility]). CSW will continue to follow to assist as needed and will assist with transfer to [facility] once bed is available.  [place], [place] [phone]

## 2014-01-24 NOTE — Consult Note (Signed)
Patient [name]      DOB: [birth year]      KPT:[id number]     Consult Note from the Palliative Medicine Team at [place] Requested by: Dr.  Wendee Beavers    PCP: Walker Kehr, MD Reason for Consultation: [place]    Phone [phone]  Assessment of patients Current state: 80 yr old white male with advanced dementia, now with dysphagia and aspiration pneumonia.  Met with his spouse who has gone through a lot with Thera Flake.  She expresses that while she is not ready to give him up that she does not want to continue to see him suffer.  Her  step son has concurred with her .  Faye vented a lot of her fears and frustrations and is suffer from the advice and scrutiny of other.  We talked about what Shayn stated he wanted in the past and I pointed out that she was merely acting as his voice now that he could not.  She decided to purse full comfort and consider medications even if it meant that he would sleep more. She has given me permission to pursue hospice facility placement as his current level of care facility  Would not be able to handle IV medications which is the only way he is able to get meds at this time due to his level of agitation.   Goals of Care: 1.  Code Status: DNR   2. Scope of Treatment: cmfort only. Ok to stop abx and pursue hospice facility placment  4. Disposition: [place] when bed available.   3. Symptom Management:   1. Anxiety/Agitation: schedule haldol and ativan with prn back ups 2. Pain:dilaudid low dose will be scheduled for pain and dyspnea 3. Bowel Regimen:monitor 4. Delirium:likely multifactorial related to dementia, pain and maybe even some existential issuse 5. Fever:tylenol pr prn. 6. Terminal Secretions: add scopolamine patch and prn atropine  4. 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 He has two sons from a previous marriage and Letta Median has one daughter.  5. Spiritual: chaplain  hospice ministering as needed.        Patient Documents Completed or Given: Document Given Completed  Advanced Directives Pkt    MOST    DNR    Gone from My Sight    Hard Choices      Brief HPI: 80 yr old with advanced stage 7 dementia with behavior, dysphagia and likely aspiration pneumonia.  We were asked to assist with goals of care.Patient currently under hospice care.  ROS: unable to obtain due to behavior.    PMH:  Past Medical History  Diagnosis Date  . Carotid artery stenosis   . Gait disturbance   . Anxiety   . GERD (gastroesophageal reflux disease)   . Vitamin B12 deficiency   . Depression   . Osteoarthritis   . Diabetes mellitus, type 2   . Hypertension   . Vitamin D deficiency   . Tremor   . Aortic stenosis     murmur  . Hyperlipidemia   . Diverticulosis   . Adenomatous polyp of colon   . Memory loss   . Polyneuropathy in diabetes(357.2)   . Dementia   . Dysrhythmia   . BPH (benign prostatic hyperplasia)      PSH: Past Surgical History  Procedure Laterality Date  . Vasectomy    . Lumbar laminectomy      L5 Dr Lorin Mercy  . Transurethral  resection of prostate  March 2007  . Cataract extraction, bilateral Bilateral   . Tonsillectomy and adenoidectomy    . Left foot      fracture of left foot   I have reviewed the [place] and SH and  If appropriate update it with new information. Allergies  Allergen Reactions  . Citalopram Hydrobromide Other (See Comments)    no reaction, refused to take  . Duloxetine Other (See Comments)    did not like, refused to continue   Scheduled Meds: . haloperidol lactate  2 mg Intravenous Q6H  .  HYDROmorphone (DILAUDID) injection  0.5 mg Intravenous 6 times per day  . ipratropium-albuterol  3 mL Nebulization BID  . LORazepam  1 mg Intravenous 4 times per day  . scopolamine  1 patch Transdermal Q72H   Continuous Infusions:  PRN Meds:.acetaminophen, atropine, haloperidol lactate, HYDROmorphone (DILAUDID) injection,  ipratropium-albuterol    BP 125/71  Pulse 80  Temp(Src) 98.6 F (37 C) (Axillary)  Resp 18  Ht 5' 2.99" (1.6 m)  Wt 59 kg (130 lb 1.1 oz)  BMI 23.05 kg/m2  SpO2 100%   PPS: 10%   Intake/Output Summary (Last 24 hours) at 01/24/14 0940 Last data filed at 01/24/14 0540  Gross per 24 hour  Intake   1449 ml  Output    200 ml  Net   1249 ml   Physical Exam:  General: agitated , swinging arms and legs, coarse wet secretions, increased work of breathing. HEENT:  Pupils equal round and reactive to light Chest:   Coarse wet rhonchi, S,1 S2 CVS: tachy, S1, S2 Abdomen:pushes my hand away, seems soft Ext: moving all in a flailing motion Neuro: confused and combative.  Labs: CBC    Component Value Date/Time   WBC 13.3* 01/23/2014 0012   RBC 2.84* 01/23/2014 0012   HGB 9.4* 01/23/2014 0012   HCT 29.3* 01/23/2014 0012   PLT 285 01/23/2014 0012   MCV 103.2* 01/23/2014 0012   MCH 33.1 01/23/2014 0012   MCHC 32.1 01/23/2014 0012   RDW 16.8* 01/23/2014 0012   LYMPHSABS 0.9 01/18/2014 2035   MONOABS 1.0 01/18/2014 2035   EOSABS 0.0 01/18/2014 2035   BASOSABS 0.0 01/18/2014 2035     CMP     Component Value Date/Time   NA 159* 01/23/2014 0012   K 3.8 01/23/2014 0012   CL 123* 01/23/2014 0012   CO2 22 01/23/2014 0012   GLUCOSE 174* 01/23/2014 0012   GLUCOSE 120* 07/01/2006 0952   BUN 48* 01/23/2014 0012   CREATININE 1.68* 01/23/2014 0012   CALCIUM 9.6 01/23/2014 0012   PROT 7.4 01/18/2014 2035   ALBUMIN 3.0* 01/18/2014 2035   AST 46* 01/18/2014 2035   ALT 26 01/18/2014 2035   ALKPHOS 81 01/18/2014 2035   BILITOT 1.9* 01/18/2014 2035   GFRNONAA 35* 01/23/2014 0012   GFRAA 41* 01/23/2014 0012    Chest Xray Reviewed/Impressions: Right much worse than left airspace disease is most worrisome for  pneumonia rather than asymmetric edema.     Time In Time Out Total Time Spent with Patient Total Overall Time  800 910AM 30 MIN 70 MIN    Greater than 50%  of this time was spent counseling and  coordinating care related to the above assessment and plan.  Tayjon Halladay L. Lovena Le, MD MBA The Palliative Medicine Team at [facility] Phone: [phone] Pager: [phone] ( Use team phone after hours)

## 2014-01-24 NOTE — Progress Notes (Signed)
Inpatient RN visit- [place] Room 1516-[facility] RN Visit-Karen Alford Highland RN  Related admission to [facility] diagnosis of Alzheimer's dz.  Pt is DNR  code.   Pt seen at bedside, lying in bed. Rhonchi audible with breathing, no use of accessory muscles noted. Pt continues on O2 @ 3 L.   Pt appears restless, squirming and pulling at the sheet. Staff RN Maudie Mercury made aware, PRN haldol given as ordered. Mrs. Joos at bedside, and reports pt had a "bad night", did not sleep and had continued restlessness. He is not eating.  PMT Dr. Lovena Le met with Mrs. Choate early this am, decision made for pt to transfer to [facility] for full comfort care. Dr. Lovena Le adjusted medications for comfort and  symptom management of agitation, pain and increased secretions.  Mrs. Bazaldua expressed her peace with the decision and that her step son's were in agreement with plan. Emotional support offered. CSW [place] aware of discharge plan, bed available at [facility] on 7/30. Pt to transfer via non emergent transfer.  [facility] will continue to follow through discharge. [facility] teram aware of d/c plan.  Patient's home medication list, transfer summary and OOF DNR in place on shadow chart.   Please call [facility] @ [phone] with any hospice needs.   Thank you. Tracey Harries, RN  [facility]  Hospice Liaison  [phone])

## 2014-01-24 NOTE — Progress Notes (Signed)
Nutrition Brief Note  Chart reviewed. Pulled to chart for low braden score.  Pt now transitioning to comfort care.  No further nutrition interventions warranted at this time.  Please re-consult as needed.   Carlis Stable MS, [place], LDN [phone] Pager [phone] Weekend/After Hours Pager

## 2014-01-24 NOTE — Progress Notes (Signed)
[facility] PROGRESS NOTE  [name] ZOX:[id number] DOB: 03/14/1925 DOA: 01/18/2014 PCP: Walker Kehr, MD  Assessment/Plan: #1 healthcare associated pneumonia/sepsis/acute respiratory failure with hypoxemia Patient was admitted and noted to be septic with a fever with chest x-ray consistent with a pneumonia. Will some concern for aspiration pneumonia. Patient was initially placed on IV vancomycin IV cefepime. He'll subsequently transitioned to IV Levaquin and then IV Unasyn. Patient was seen by palliative care and goal was quality of life and comfort measures. IV antibiotics have subsequently been discontinued and patient will be transitioned to a hospice home.  #2 acute renal failure Likely secondary to prerenal azotemia. Patient was placed on IV fluids.  #3 hypernatremia Secondary to dehydration secondary to poor oral intake. Patient was placed on D5W. IV fluids have been discontinued as family has decided on full comfort measures. Patient will be transitioned to a hospice home.  #4 new-onset atrial fibrillation On admission patient was noted to be in A. fib with RVR. Patient was placed on a diltiazem drip and subsequently transitioned to a beta blocker. Patient's heart rate remained controlled. Due to patient's history of dementia and increased fall risk it was felt patient was not a anticoagulation candidate. She was placed on aspirin.  #5 delirium with underlying dementia Likely secondary to acute illness of healthcare associated pneumonia and dehydration and hypernatremia in the setting of dementia. Patient has been placed on Haldol and Ativan for agitation.  #6 prognosis Patient has been declining over the past few months since his hip surgery. Patient with poor oral intake and failure to thrive. Patient presented with a healthcare associated pneumonia noted to be dehydrated and hyponatremic. Patient has been seen by palliative care will have met with the family and goals  of care have been discussed. Patient is currently DO NOT RESUSCITATE. IV fluids and IV antibiotics have been discontinued. Patient will be transitioned to hospice home when a bed is available.    Code Status: DO NOT RESUSCITATE Family Communication: Updated patient and wife at bedside. Disposition Plan: Hospice home/[facility] when bed available.   Consultants:  Palliative care: Dr. Lovena Le  Procedures:  Chest x-ray 01/18/2014  Antibiotics:  IV Unasyn 01/23/2014>>>> 01/24/2014  IV cefepime 01/19/2014>>>>> 01/21/2014  IV Rocephin 01/18/2014>>>>> 01/19/2014  IV Levaquin 01/21/2014>>>> 01/23/2014  IV vancomycin 01/19/2014>>>>> 01/21/2014  HPI/Subjective: Patient laying in bed with mittens on with some agitation.  Objective: Filed Vitals:   01/24/14 1547  BP: 125/55  Pulse: 79  Temp: 98.2 F (36.8 C)  Resp: 18    Intake/Output Summary (Last 24 hours) at 01/24/14 1728 Last data filed at 01/24/14 0540  Gross per 24 hour  Intake   1449 ml  Output    200 ml  Net   1249 ml   Filed Weights   01/19/14 0006  Weight: 59 kg (130 lb 1.1 oz)    Exam:   General:  NAD.  Cardiovascular: RRR  Respiratory: Coarse BS in the anterior lung fields.  Abdomen: Soft, nontender, nondistended, positive bowel sounds.  Musculoskeletal: No clubbing cyanosis or edema  Data Reviewed: Basic Metabolic Panel:  Recent Labs Lab 01/18/14 2035 01/19/14 0500 01/20/14 0550 01/23/14 0012  NA 146 146 149* 159*  K 4.4 4.1 3.8 3.8  CL 108 110 112 123*  CO2 '23 26 21 22  ' GLUCOSE 187* 167* 141* 174*  BUN 41* 45* 53* 48*  CREATININE 1.60* 1.69* 1.63* 1.68*  CALCIUM 10.0 9.4 9.4 9.6   Liver Function Tests:  Recent Labs Lab  01/18/14 2035  AST 46*  ALT 26  ALKPHOS 81  BILITOT 1.9*  PROT 7.4  ALBUMIN 3.0*   No results found for this basename: LIPASE, AMYLASE,  in the last 168 hours No results found for this basename: AMMONIA,  in the last 168 hours CBC:  Recent Labs Lab  01/18/14 2035 01/19/14 0500 01/23/14 0012  WBC 16.1* 13.5* 13.3*  NEUTROABS 14.1*  --   --   HGB 10.3* 8.8* 9.4*  HCT 31.1* 26.3* 29.3*  MCV 102.3* 101.2* 103.2*  PLT 224 177 285   Cardiac Enzymes: No results found for this basename: CKTOTAL, CKMB, CKMBINDEX, TROPONINI,  in the last 168 hours BNP (last 3 results) No results found for this basename: PROBNP,  in the last 8760 hours CBG:  Recent Labs Lab 01/23/14 1708 01/23/14 2207 01/24/14 0744 01/24/14 1206 01/24/14 1645  GLUCAP 135* 140* 142* 138* 129*    Recent Results (from the past 240 hour(s))  CULTURE, BLOOD (ROUTINE X 2)     Status: None   Collection Time    01/18/14  8:35 PM      Result Value Ref Range Status   Specimen Description BLOOD RIGHT FOREARM   Final   Special Requests BOTTLES DRAWN AEROBIC AND ANAEROBIC 5 ML   Final   Culture  Setup Time     Final   Value: 01/19/2014 00:46     Performed at [facility]   Culture     Final   Value:        BLOOD CULTURE RECEIVED NO GROWTH TO DATE CULTURE WILL BE HELD FOR 5 DAYS BEFORE ISSUING A FINAL NEGATIVE REPORT     Performed at [facility]   Report Status PENDING   Incomplete  CULTURE, BLOOD (ROUTINE X 2)     Status: None   Collection Time    01/18/14  8:35 PM      Result Value Ref Range Status   Specimen Description BLOOD LEFT FOREARM   Final   Special Requests BOTTLES DRAWN AEROBIC ONLY 3 ML   Final   Culture  Setup Time     Final   Value: 01/19/2014 00:45     Performed at [facility]   Culture     Final   Value:        BLOOD CULTURE RECEIVED NO GROWTH TO DATE CULTURE WILL BE HELD FOR 5 DAYS BEFORE ISSUING A FINAL NEGATIVE REPORT     Performed at [facility]   Report Status PENDING   Incomplete  URINE CULTURE     Status: None   Collection Time    01/18/14  9:24 PM      Result Value Ref Range Status   Specimen Description URINE, CATHETERIZED   Final   Special Requests NONE   Final   Culture  Setup Time     Final    Value: 01/19/2014 01:37     Performed at [place] [facility]     Final   Value: NO GROWTH     Performed at [facility]   Culture     Final   Value: NO GROWTH     Performed at [facility]   Report Status 01/19/2014 FINAL   Final     Studies: No results found.  Scheduled Meds: . haloperidol lactate  2 mg Intravenous Q6H  .  HYDROmorphone (DILAUDID) injection  0.5 mg Intravenous 6 times per day  . ipratropium-albuterol  3 mL Nebulization BID  . LORazepam  1 mg Intravenous 4 times per day  . scopolamine  1 patch Transdermal Q72H   Continuous Infusions:   Principal Problem:   HCAP (healthcare-associated pneumonia) Active Problems:   Acute respiratory failure with hypoxemia   Dehydration   Acute renal injury   Sepsis    Time spent: 67 minutes    [name] M.D. [facility] Pager [phone]. If 7PM-7AM, please contact night-coverage at [email or web address], password [facility] 01/24/2014, 5:28 PM  LOS: 6 days

## 2014-01-24 NOTE — Progress Notes (Signed)
SLP Cancellation Note  Patient Details Name: [name] MRN: [id number] DOB: [birth year]   Cancelled treatment:        Pt now comfort care per documentation. ST to sign off. Please reconsult if needs arise.  Lianette Broussard B. Quentin Ore [facility], CCC-SLP [phone]  Shonna Chock 01/24/2014, 1:16 PM

## 2014-01-24 NOTE — Progress Notes (Signed)
Clinical Social Work  CSW received a call from PMT MD (Dr. Lovena Le) who reports that wife had questions about DC plans to [facility] and patient not returning to [facility]. CSW met with wife outside of room and wife reports that she spoke with hospice SW and has decided to meet with [facility] tomorrow to alert them that patient will not return and to gather patient's belongings. Wife tearful and reports she wants to make the best decisions for patient. CSW validated wife's feelings and reassured her that she was making decisions that were going to provide patient with care. Wife reports that step son and patient's siblings have been supportive and is hopeful that patient will adjust well to [facility].  CSW will continue to follow.  [place], [place] [phone]

## 2014-01-24 NOTE — Consult Note (Signed)
Patient [name]      DOB: [birth year]      NHA:[id number]  Summary of Goals of care; full note to follow:  Met with patients wife who is patient's guardian.  Patient has two sons one who is local and the other who live in [place], and one step daughter. Letta Median is the Media planner.  Son in [place] is a Vet and has been brought up to date by the Hospice RN  Letta Median finally sees that [name] or Mumin has reached a point of no return in his illness.  She has been down a long road that took her through major legal battles because of Dorion's Dementia with related Paranoia.  She says she just could see it in the early years but she now knows that he is suffering and she can't stand that .  She states she loves him but wants this time in his life to be dignified and not like this.  She is open to full comfort including no labs , abx, or curative treatments.  She would like to consider transition to [facility] for end of life care.  She recognizes that he is not swallowing and we discussed the added damage that long term IV fluids are doing to him right now since he has signficant pulmonary symptoms- dyspnea and terminal secretions.    Recommend:  1.  DNR  2.  Dyspnea/with terminal secretions:  Schedule Dilaudid and use prn , add scopolamine, add prn atropine, dc iv fluids  3.  Dysphagia: NPO until we can get better control of behavior, then comfort feeding  4.  Dementia with agitation: schedule haldol, and ativan.  Wife ok if he sleeps more just doesn't want to see him so wild.  He has had trouble with behavioral issues throughout his dementia.  I have spoken with hospice team to ask for [facility] transfer.   Total time: 800 am- 910 am  Agam Davenport L. Lovena Le, MD MBA The Palliative Medicine Team at [facility] Phone: [phone] Pager: [phone] ( Use team phone after hours)

## 2014-01-25 ENCOUNTER — Encounter: Payer: Self-pay | Admitting: Internal Medicine

## 2014-01-25 DIAGNOSIS — F411 Generalized anxiety disorder: Secondary | ICD-10-CM | POA: Diagnosis not present

## 2014-01-25 DIAGNOSIS — F0391 Unspecified dementia with behavioral disturbance: Secondary | ICD-10-CM | POA: Diagnosis not present

## 2014-01-25 DIAGNOSIS — IMO0002 Reserved for concepts with insufficient information to code with codable children: Secondary | ICD-10-CM | POA: Diagnosis not present

## 2014-01-25 DIAGNOSIS — A419 Sepsis, unspecified organism: Secondary | ICD-10-CM | POA: Diagnosis not present

## 2014-01-25 DIAGNOSIS — F03918 Unspecified dementia, unspecified severity, with other behavioral disturbance: Secondary | ICD-10-CM | POA: Diagnosis not present

## 2014-01-25 LAB — CULTURE, BLOOD (ROUTINE X 2)
Culture: NO GROWTH
Culture: NO GROWTH

## 2014-01-25 MED ORDER — ATROPINE SULFATE 1 % OP SOLN: 4.0000 [drp] | OPHTHALMIC | Status: AC | PRN

## 2014-01-25 MED ORDER — IPRATROPIUM-ALBUTEROL 0.5-2.5 (3) MG/3ML IN SOLN: 3.0000 mL | RESPIRATORY_TRACT | Status: AC | PRN

## 2014-01-25 MED ORDER — QUETIAPINE FUMARATE 50 MG PO TABS: 50.0000 mg | ORAL_TABLET | Freq: Every day | ORAL | Status: AC

## 2014-01-25 MED ORDER — LORAZEPAM 2 MG/ML IJ SOLN: 1.0000 mg | Freq: Four times a day (QID) | INTRAMUSCULAR | Status: AC

## 2014-01-25 MED ORDER — HYDROMORPHONE HCL PF 1 MG/ML IJ SOLN: 1.0000 mg | INTRAMUSCULAR | Status: AC | PRN

## 2014-01-25 MED ORDER — HALOPERIDOL LACTATE 5 MG/ML IJ SOLN: 2.0000 mg | Freq: Four times a day (QID) | INTRAMUSCULAR | Status: AC

## 2014-01-25 MED ORDER — ONDANSETRON 4 MG PO TBDP: 4.0000 mg | ORAL_TABLET | Freq: Three times a day (TID) | ORAL | Status: AC | PRN

## 2014-01-25 MED ORDER — HALOPERIDOL LACTATE 5 MG/ML IJ SOLN: 2.0000 mg | INTRAMUSCULAR | Status: AC | PRN

## 2014-01-25 MED ORDER — HYDROMORPHONE HCL PF 1 MG/ML IJ SOLN: 0.5000 mg | INTRAMUSCULAR | Status: AC

## 2014-01-25 MED ORDER — SCOPOLAMINE 1 MG/3DAYS TD PT72: 1.0000 | MEDICATED_PATCH | TRANSDERMAL | Status: AC

## 2014-01-25 MED ORDER — IPRATROPIUM-ALBUTEROL 0.5-2.5 (3) MG/3ML IN SOLN: 3.0000 mL | Freq: Two times a day (BID) | RESPIRATORY_TRACT | Status: AC

## 2014-01-25 NOTE — Discharge Summary (Signed)
Physician Discharge Summary  [name]:[id number] DOB: [birth year] DOA: 01/18/2014  PCP: Walker Kehr, MD  Admit date: 01/18/2014 Discharge date: 01/25/2014  Time spent: 65 minutes  Recommendations for Outpatient Follow-up: #1. Patient is to be discharged to [facility] today. Patient will followup with M.D. at [facility].  Discharge Diagnoses:  Principal Problem:   Sepsis Active Problems:   HCAP (healthcare-associated pneumonia)   Acute respiratory failure with hypoxemia   Dehydration   Acute renal injury   Discharge Condition: stable  Diet recommendation: Regular/comfort feeds.  Filed Weights   01/19/14 0006  Weight: 59 kg (130 lb 1.1 oz)    History of present illness:  The patient per admitting MD had presented with increased agitation, confusion, fever, congestion, and poor PO intake for 1 day prior to admission. Pt was receiving hospice care. His family stated that he is comfort care, but that they want him to receive IV antibiotics and IV fluids. Patient was DNR. Patient's wife stated that she did not want the patient's DM treated. She also did not want any further treatment of the patient's atrial fibrillation. He did receive IV diltiazem upon presentation to the ED in atrial fibrillation with RVR.   Hospital Course:  #1 healthcare associated pneumonia/sepsis/acute respiratory failure with hypoxemia  Patient was admitted and noted to be septic with a fever with chest x-ray consistent with a pneumonia. With some concern for aspiration pneumonia. Patient was initially placed on IV vancomycin, IV cefepime. Patient subsequently transitioned to IV Levaquin and then IV Unasyn. Patient was seen by palliative care and goals of care decided and family decided on quality of life and comfort measures. IV antibiotics were subsequently discontinued and patient will be transitioned to a hospice home today.  #2 acute renal failure  Likely secondary to prerenal azotemia.  Patient was placed on IV fluids.  #3 hypernatremia  Secondary to dehydration secondary to poor oral intake. Patient was placed on D5W. IV fluids have been discontinued as family has decided on full comfort measures. Patient will be transitioned to a hospice home today.  #4 new-onset atrial fibrillation  On admission patient was noted to be in A. fib with RVR. Patient was placed on a diltiazem drip and subsequently transitioned to a beta blocker. Patient's heart rate remained controlled. Due to patient's history of dementia and increased fall risk it was felt patient was not a anticoagulation candidate.  #5 delirium with underlying dementia  Likely secondary to acute illness of healthcare associated pneumonia and dehydration and hypernatremia in the setting of dementia. Patient has been placed on Haldol and Ativan for agitation.  #6 prognosis  Patient has been declining over the past few months since his hip surgery. Patient with poor oral intake and failure to thrive. Patient presented with a healthcare associated pneumonia noted to be dehydrated and hyponatremic. Patient has been seen by palliative care who have met with the family and goals of care have been discussed. Patient is currently DO NOT RESUSCITATE. IV fluids and IV antibiotics have been discontinued. Patient will be transitioned to hospice home at [facility] today.    Procedures: Chest x-ray 01/18/2014   Consultations: Palliative care: Dr. Lovena Le   Discharge Exam: Filed Vitals:   01/24/14 2242  BP: 129/94  Pulse: 105  Resp: 22    General: NAD. Sleeping comfortably. Cardiovascular: RRR Respiratory: Coarse, rhonchorus BS anterior lung fields.  Discharge Instructions You were cared for by a hospitalist during your hospital stay. If you have any questions  about your discharge medications or the care you received while you were in the hospital after you are discharged, you can call the unit and asked to speak with the  hospitalist on call if the hospitalist that took care of you is not available. Once you are discharged, your primary care physician will handle any further medical issues. Please note that NO REFILLS for any discharge medications will be authorized once you are discharged, as it is imperative that you return to your primary care physician (or establish a relationship with a primary care physician if you do not have one) for your aftercare needs so that they can reassess your need for medications and monitor your lab values.      Discharge Instructions   Diet general    Complete by:  As directed   Comfort feeds     Discharge instructions    Complete by:  As directed   Follow up with MD at SNF.     Increase activity slowly    Complete by:  As directed             Medication List    STOP taking these medications       ARIPiprazole 5 MG tablet  Commonly known as:  ABILIFY     furosemide 40 MG tablet  Commonly known as:  LASIX     losartan 50 MG tablet  Commonly known as:  COZAAR     morphine 15 MG tablet  Commonly known as:  MSIR     traMADol 50 MG tablet  Commonly known as:  ULTRAM      TAKE these medications       acetaminophen 325 MG tablet  Commonly known as:  TYLENOL  Take 650 mg by mouth every 6 (six) hours as needed (pain).     atropine 1 % ophthalmic solution  Place 4 drops under the tongue every 4 (four) hours as needed (terminal secretions).     clonazePAM 0.5 MG tablet  Commonly known as:  KLONOPIN  Use 0.5 mg at breakfast and 0.5 mg at lunch. Use 1.5 mg at bedtime.     diclofenac sodium 1 % Gel  Commonly known as:  VOLTAREN  Apply 2 g topically 2 (two) times daily as needed (pain left ankle and left hip). For pain     haloperidol lactate 5 MG/ML injection  Commonly known as:  HALDOL  Inject 0.4 mLs (2 mg total) into the vein every 6 (six) hours.     haloperidol lactate 5 MG/ML injection  Commonly known as:  HALDOL  Inject 0.4 mLs (2 mg total) into  the vein every 3 (three) hours as needed.     HYDROmorphone 1 MG/ML Soln injection  Commonly known as:  DILAUDID  Inject 0.5 mLs (0.5 mg total) into the vein every 4 (four) hours.     HYDROmorphone 1 MG/ML Soln injection  Commonly known as:  DILAUDID  Inject 1 mL (1 mg total) into the vein every 3 (three) hours as needed for severe pain (dyspnea).     ipratropium-albuterol 0.5-2.5 (3) MG/3ML Soln  Commonly known as:  DUONEB  Take 3 mLs by nebulization 2 (two) times daily.     ipratropium-albuterol 0.5-2.5 (3) MG/3ML Soln  Commonly known as:  DUONEB  Take 3 mLs by nebulization every 4 (four) hours as needed.     LORazepam 2 MG/ML injection  Commonly known as:  ATIVAN  Inject 0.5 mLs (1 mg total) into the vein every  6 (six) hours.     ondansetron 4 MG disintegrating tablet  Commonly known as:  ZOFRAN-ODT  Take 1 tablet (4 mg total) by mouth every 8 (eight) hours as needed for nausea or vomiting (nausea and vomiting).     potassium chloride 10 MEQ tablet  Commonly known as:  KLOR-CON 10  Take 1 tablet (10 mEq total) by mouth 2 (two) times daily.     QUEtiapine 50 MG tablet  Commonly known as:  SEROQUEL  Take 1 tablet (50 mg total) by mouth at bedtime.     scopolamine 1 MG/3DAYS  Commonly known as:  TRANSDERM-SCOP  Place 1 patch (1.5 mg total) onto the skin every 3 (three) days.     senna 8.6 MG Tabs tablet  Commonly known as:  SENOKOT  Take 2 tablets by mouth daily.     sertraline 50 MG tablet  Commonly known as:  ZOLOFT  Take 1 tablet (50 mg total) by mouth 2 (two) times daily.       Allergies  Allergen Reactions  . Citalopram Hydrobromide Other (See Comments)    no reaction, refused to take  . Duloxetine Other (See Comments)    did not like, refused to continue   Follow-up Information   Please follow up. (f/u with MD at [facility])        The results of significant diagnostics from this hospitalization (including imaging, microbiology, ancillary and  laboratory) are listed below for reference.    Significant Diagnostic Studies: Dg Chest Port 1 View  01/18/2014   CLINICAL DATA:  Shortness of breath and weakness.  EXAM: PORTABLE CHEST - 1 VIEW  COMPARISON:  Single view of the chest 06/20/2013.  FINDINGS: There is right much worse than left airspace disease. Cardiomegaly is identified. No pneumothorax or pleural effusion.  IMPRESSION: Right much worse than left airspace disease is most worrisome for pneumonia rather than asymmetric edema.   Electronically Signed   By: Inge Rise M.D.   On: 01/18/2014 20:29    Microbiology: Recent Results (from the past 240 hour(s))  CULTURE, BLOOD (ROUTINE X 2)     Status: None   Collection Time    01/18/14  8:35 PM      Result Value Ref Range Status   Specimen Description BLOOD RIGHT FOREARM   Final   Special Requests BOTTLES DRAWN AEROBIC AND ANAEROBIC 5 ML   Final   Culture  Setup Time     Final   Value: 01/19/2014 00:46     Performed at [facility]   Culture     Final   Value: NO GROWTH 5 DAYS     Performed at [facility]   Report Status 01/25/2014 FINAL   Final  CULTURE, BLOOD (ROUTINE X 2)     Status: None   Collection Time    01/18/14  8:35 PM      Result Value Ref Range Status   Specimen Description BLOOD LEFT FOREARM   Final   Special Requests BOTTLES DRAWN AEROBIC ONLY 3 ML   Final   Culture  Setup Time     Final   Value: 01/19/2014 00:45     Performed at [facility]   Culture     Final   Value: NO GROWTH 5 DAYS     Performed at [facility]   Report Status 01/25/2014 FINAL   Final  URINE CULTURE     Status: None   Collection Time  01/18/14  9:24 PM      Result Value Ref Range Status   Specimen Description URINE, CATHETERIZED   Final   Special Requests NONE   Final   Culture  Setup Time     Final   Value: 01/19/2014 01:37     Performed at [facility]     Final   Value: NO GROWTH     Performed at [facility]   Culture     Final   Value: NO GROWTH     Performed at [facility]   Report Status 01/19/2014 FINAL   Final     Labs: Basic Metabolic Panel:  Recent Labs Lab 01/18/14 2035 01/19/14 0500 01/20/14 0550 01/23/14 0012  NA 146 146 149* 159*  K 4.4 4.1 3.8 3.8  CL 108 110 112 123*  CO2 _0 GLUCOSE 187* 167* 141* 174*  BUN 41* 45* 53* 48*  CREATININE 1.60* 1.69* 1.63* 1.68*  CALCIUM 10.0 9.4 9.4 9.6   Liver Function Tests:  Recent Labs Lab 01/18/14 2035  AST 46*  ALT 26  ALKPHOS 81  BILITOT 1.9*  PROT 7.4  ALBUMIN 3.0*   No results found for this basename: LIPASE, AMYLASE,  in the last 168 hours No results found for this basename: AMMONIA,  in the last 168 hours CBC:  Recent Labs Lab 01/18/14 2035 01/19/14 0500 01/23/14 0012  WBC 16.1* 13.5* 13.3*  NEUTROABS 14.1*  --   --   HGB 10.3* 8.8* 9.4*  HCT 31.1* 26.3* 29.3*  MCV 102.3* 101.2* 103.2*  PLT 224 177 285   Cardiac Enzymes: No results found for this basename: CKTOTAL, CKMB, CKMBINDEX, TROPONINI,  in the last 168 hours BNP: BNP (last 3 results) No results found for this basename: PROBNP,  in the last 8760 hours CBG:  Recent Labs Lab 01/23/14 1708 01/23/14 2207 01/24/14 0744 01/24/14 1206 01/24/14 1645  GLUCAP 135* 140* 142* 138* 129*       Signed:  THOMPSON,DANIEL MD [facility] 01/25/2014, 11:49 AM

## 2014-01-25 NOTE — Progress Notes (Signed)
Clinical Social Work  CSW faxed DC summary to [facility] who is agreeable to accept. RN to call report to [facility]. CSW alerted wife of DC plans and she reports she will meet patient at [facility]. CSW prepared DC packet with DC summary, DNR, PTAR forms, and hard scripts included. CSW coordinated transportation via [place]. CSW is signing off but available if needed.  [place], [place] [phone]

## 2014-01-25 NOTE — Progress Notes (Signed)
Discharge instructions accompanied pt, left the unit in stable condition via ambulance to [facility].

## 2014-01-25 NOTE — Progress Notes (Signed)
Inpatient RN visit- [facility] RN Visit-Karen Alford [place] RN  Related admission to [facility] diagnosis of Alzheimer's dz.  Pt is DNR code.   Pt seen at bedside, eyes closed, RR even unlabored, mouth breathing. Secretions effectively managed with scopolamine patch and PRN atropine drops. Spoke with staff RN Ester, who reported that she had just given the scheduled haldol. Pt appears more comfortable at visit today, no restlessness noted. Plan is for pt to transfer to [facility] today for EOL care. Patient's home medication list, transfer summary and OOF DNR in place on shadow chart.  Writer confirmed plans with CSW [place], who will fax d/c summary to [facility] [phone])  when completed and arrange for transport. Staff Rn to call report to [facility] [phone]) at discharge. Pt's cg Sybella present during visit. Emotional support offered.  Please call [facility] @ [phone]-with any hospice needs.   Thank you. Tracey Harries, RN  [facility]  Hospice Liaison  [phone])

## 2014-01-25 NOTE — Progress Notes (Signed)
Patient [name]      DOB: 07/12/1924      GDJ:[id number]   Palliative Medicine Team at [facility] Progress Note    Subjective:  Patient in deep sleep but with tactile stimulation he awakens and throws up his hands in anger, punching at the air .  Respirations significantly increased with arousal. Patient will not permit oral medications. Plan for transition to [facility] when bed available.  Filed Vitals:   01/24/14 2242  BP: 129/94  Pulse: 105  Resp: 22   Physical exam:  General : agitated after repositioning, lashing out PERRL, EOMI, unable to vocalize Chest coarse rhonchi present but have improved since yesterday. CVS tachy, S1, S2 ABd: soft, not distended Ext: warm, no mottling Neuro: agitated.  Assessment and plan: 80 yr old with advanced dementia, aspiration pneumonia.  Spouse desires comfort care at this time.  Patient not permitting oral medications.  1.  DNR  2.  Dyspnea: Scheduled low dose dilaudid with prn dosing  3. Dementia with behavior: scheduled ativan, and haldol.  4. Terminal secretions: scopolamine and atropine in place.  Total time 700 am- 740 am  Celvin Taney L. Lovena Le, MD MBA The Palliative Medicine Team at [facility] Phone: [phone] Pager: [phone] ( Use team phone after hours)

## 2014-01-27 ENCOUNTER — Encounter: Payer: Self-pay | Admitting: Internal Medicine

## 2014-02-02 ENCOUNTER — Ambulatory Visit: Payer: Medicare Other | Admitting: Nurse Practitioner

## 2014-02-27 DEATH — deceased

## 2014-03-20 ENCOUNTER — Ambulatory Visit: Payer: Self-pay | Admitting: Internal Medicine

## 2014-06-19 IMAGING — CT CT CERVICAL SPINE W/O CM
2 of 3 series · 8 of 14 positions shown, 10 images · non-contrast
Comparison: Head CT 12/18/2012

CLINICAL DATA: Patient fell with bump to occiput and headache this
past week

EXAM:
CT HEAD WITHOUT CONTRAST
CT CERVICAL SPINE WITHOUT CONTRAST
TECHNIQUE: Multidetector CT imaging of the head and cervical spine was
performed following the standard protocol without intravenous
contrast. Multiplanar CT image reconstructions of the cervical spine
were also generated.

[Series 4: bone windows · axial · 0.43mm/px · z∈[-102,-27]mm · 3 of 51 slices shown]
[im 13/51  bone]
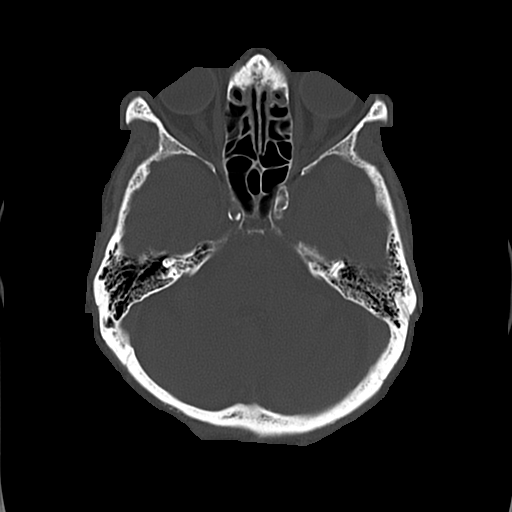
[im 26/51  bone]
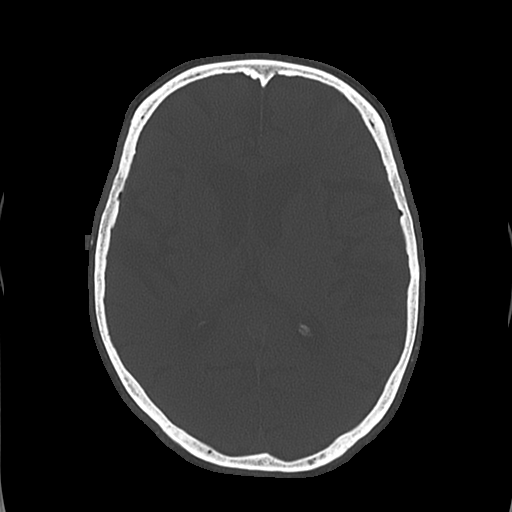
[im 38/51  bone]
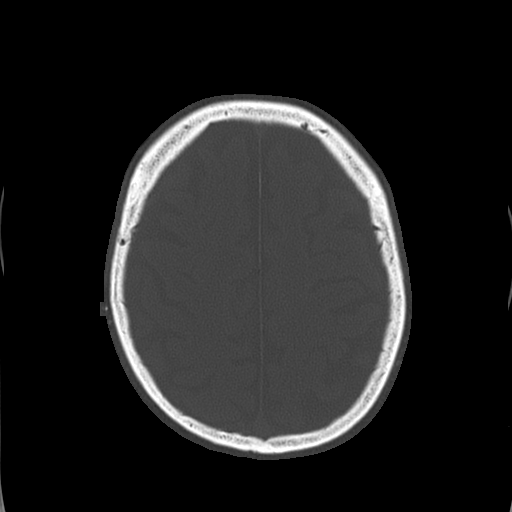

[Series 5: c-spine st · axial · 0.26mm/px · z∈[-260,-160]mm · 5 of 76 slices shown, 7 images]
[im 13/76  soft-tissue]
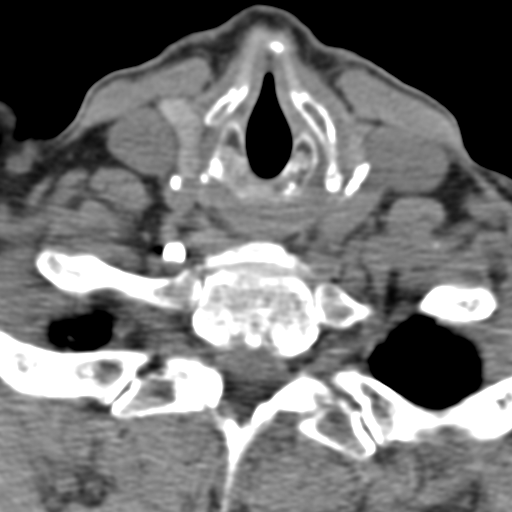
[im 13/76  bone]
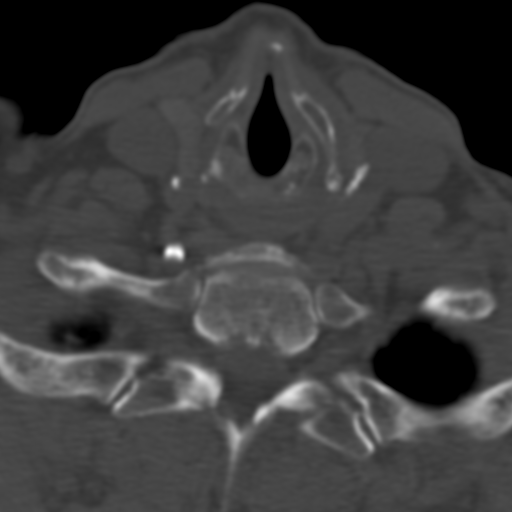
[im 26/76  bone]
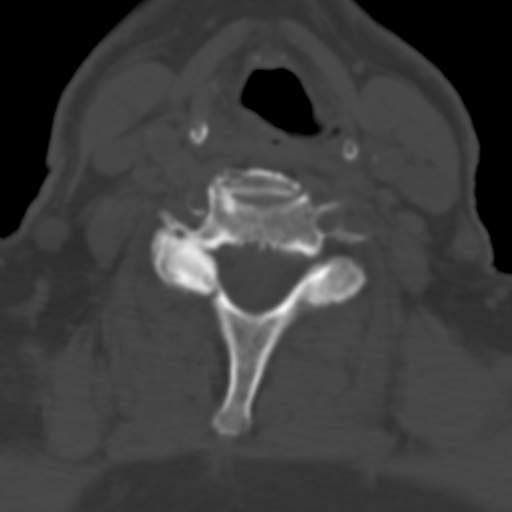
[im 38/76  bone]
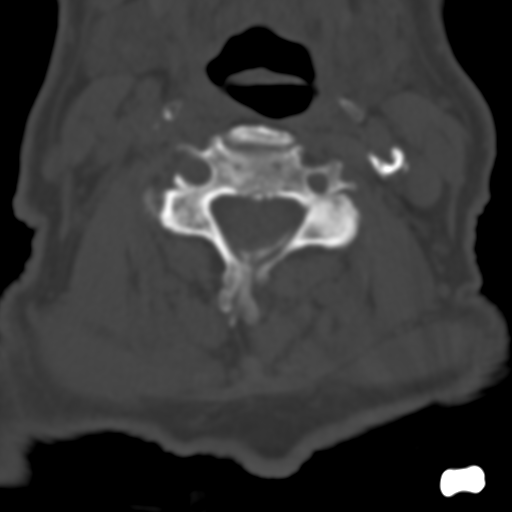
[im 51/76  bone]
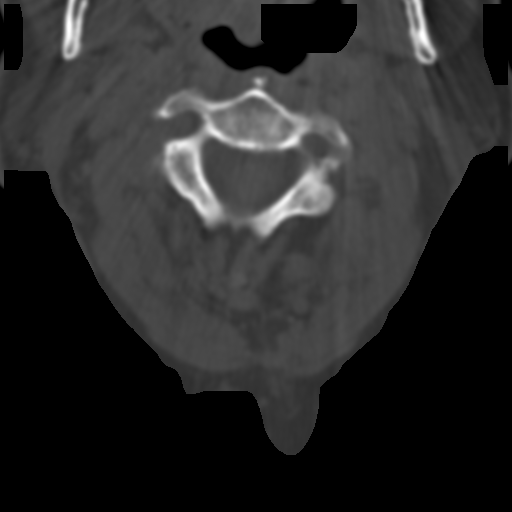
[im 63/76  soft-tissue]
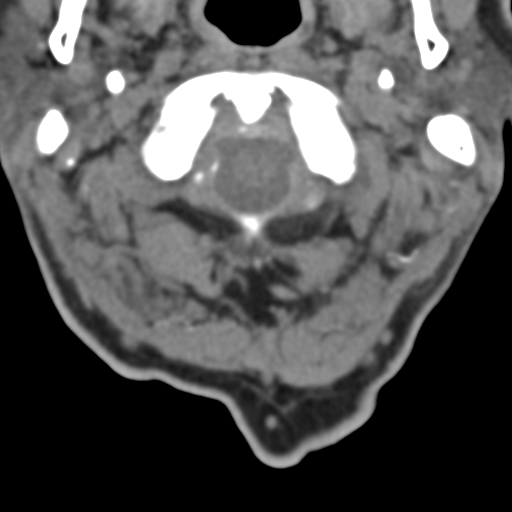
[im 63/76  bone]
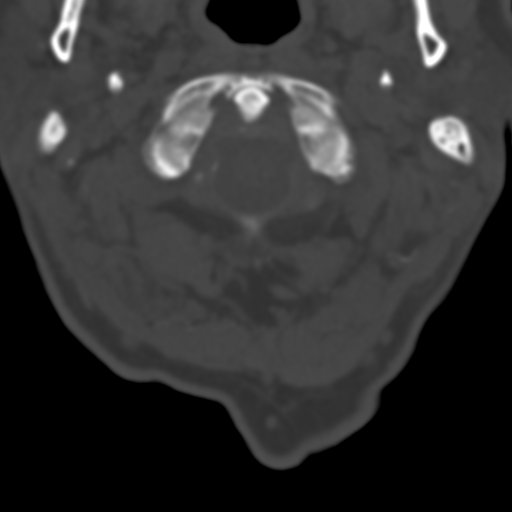

[8 of 14 positions shown; findings below may reference images not displayed]

FINDINGS: CT HEAD FINDINGS

Diffuse atrophy and low attenuation in the deep white matter. No
change in this process. No vascular territory infarct, hemorrhage,
or extra-axial fluid. Calvarium is intact.

CT CERVICAL SPINE FINDINGS

Normal alignment. No prevertebral soft tissue swelling. Multilevel
degenerative disc disease. No fractures.
IMPRESSION: No acute intracranial abnormality. No acute traumatic injury
involving the cervical spine.
